# Patient Record
Sex: Female | Born: 1965 | Race: Black or African American | Hispanic: No | Marital: Single | Smoking: Never smoker
Health system: Southern US, Community
[De-identification: ages and names within clinical notes are randomized; demographics above are authoritative.]

## PROBLEM LIST (undated history)

## (undated) DIAGNOSIS — C801 Malignant (primary) neoplasm, unspecified: Secondary | ICD-10-CM

## (undated) DIAGNOSIS — E039 Hypothyroidism, unspecified: Secondary | ICD-10-CM

## (undated) DIAGNOSIS — I639 Cerebral infarction, unspecified: Secondary | ICD-10-CM

## (undated) DIAGNOSIS — R194 Change in bowel habit: Secondary | ICD-10-CM

## (undated) DIAGNOSIS — Z973 Presence of spectacles and contact lenses: Secondary | ICD-10-CM

## (undated) DIAGNOSIS — C8330 Diffuse large B-cell lymphoma, unspecified site: Secondary | ICD-10-CM

## (undated) DIAGNOSIS — C73 Malignant neoplasm of thyroid gland: Secondary | ICD-10-CM

## (undated) DIAGNOSIS — N8110 Cystocele, unspecified: Secondary | ICD-10-CM

## (undated) DIAGNOSIS — M19011 Primary osteoarthritis, right shoulder: Secondary | ICD-10-CM

## (undated) DIAGNOSIS — Z923 Personal history of irradiation: Secondary | ICD-10-CM

## (undated) DIAGNOSIS — D709 Neutropenia, unspecified: Secondary | ICD-10-CM

## (undated) DIAGNOSIS — Z8572 Personal history of non-Hodgkin lymphomas: Secondary | ICD-10-CM

## (undated) DIAGNOSIS — E119 Type 2 diabetes mellitus without complications: Secondary | ICD-10-CM

## (undated) DIAGNOSIS — R5081 Fever presenting with conditions classified elsewhere: Principal | ICD-10-CM

## (undated) DIAGNOSIS — K5900 Constipation, unspecified: Secondary | ICD-10-CM

## (undated) DIAGNOSIS — Z9889 Other specified postprocedural states: Secondary | ICD-10-CM

## (undated) DIAGNOSIS — R112 Nausea with vomiting, unspecified: Secondary | ICD-10-CM

## (undated) HISTORY — PX: OTHER SURGICAL HISTORY: SHX169

## (undated) HISTORY — DX: Neutropenia, unspecified: D70.9

## (undated) HISTORY — PX: REDUCTION MAMMAPLASTY: SUR839

## (undated) HISTORY — PX: COSMETIC SURGERY: SHX468

## (undated) HISTORY — DX: Diffuse large B-cell lymphoma, unspecified site: C83.30

## (undated) HISTORY — DX: Fever presenting with conditions classified elsewhere: R50.81

## (undated) HISTORY — DX: Personal history of irradiation: Z92.3

## (undated) HISTORY — DX: Type 2 diabetes mellitus without complications: E11.9

## (undated) HISTORY — PX: JOINT REPLACEMENT: SHX530

## (undated) HISTORY — PX: BREAST SURGERY: SHX581

## (undated) HISTORY — DX: Change in bowel habit: R19.4

## (undated) HISTORY — PX: COLONOSCOPY: SHX174

## (undated) HISTORY — DX: Personal history of non-Hodgkin lymphomas: Z85.72

---

## 1988-12-14 HISTORY — PX: TUBAL LIGATION: SHX77

## 1998-12-14 HISTORY — PX: ABDOMINAL HYSTERECTOMY: SHX81

## 2000-04-13 LAB — HM PAP SMEAR: HM PAP: NORMAL

## 2005-12-14 DIAGNOSIS — C801 Malignant (primary) neoplasm, unspecified: Secondary | ICD-10-CM

## 2005-12-14 HISTORY — DX: Malignant (primary) neoplasm, unspecified: C80.1

## 2005-12-14 HISTORY — PX: THYROIDECTOMY: SHX17

## 2006-05-21 ENCOUNTER — Encounter: Admission: RE | Admit: 2006-05-21 | Discharge: 2006-05-21 | Payer: Self-pay | Admitting: Obstetrics and Gynecology

## 2006-06-04 ENCOUNTER — Encounter: Admission: RE | Admit: 2006-06-04 | Discharge: 2006-06-04 | Payer: Self-pay | Admitting: Obstetrics and Gynecology

## 2006-07-20 ENCOUNTER — Other Ambulatory Visit: Admission: RE | Admit: 2006-07-20 | Discharge: 2006-07-20 | Payer: Self-pay | Admitting: Interventional Radiology

## 2006-07-20 ENCOUNTER — Encounter (INDEPENDENT_AMBULATORY_CARE_PROVIDER_SITE_OTHER): Payer: Self-pay | Admitting: *Deleted

## 2006-07-20 ENCOUNTER — Encounter: Admission: RE | Admit: 2006-07-20 | Discharge: 2006-07-20 | Payer: Self-pay | Admitting: Endocrinology

## 2006-08-17 ENCOUNTER — Encounter (INDEPENDENT_AMBULATORY_CARE_PROVIDER_SITE_OTHER): Payer: Self-pay | Admitting: Specialist

## 2006-08-17 ENCOUNTER — Ambulatory Visit (HOSPITAL_COMMUNITY): Admission: RE | Admit: 2006-08-17 | Discharge: 2006-08-18 | Payer: Self-pay | Admitting: General Surgery

## 2006-09-06 ENCOUNTER — Encounter: Admission: RE | Admit: 2006-09-06 | Discharge: 2006-09-06 | Payer: Self-pay | Admitting: Endocrinology

## 2006-09-16 ENCOUNTER — Encounter: Admission: RE | Admit: 2006-09-16 | Discharge: 2006-09-16 | Payer: Self-pay | Admitting: Endocrinology

## 2006-09-23 ENCOUNTER — Encounter: Admission: RE | Admit: 2006-09-23 | Discharge: 2006-09-23 | Payer: Self-pay | Admitting: Endocrinology

## 2007-07-01 ENCOUNTER — Encounter: Admission: RE | Admit: 2007-07-01 | Discharge: 2007-07-01 | Payer: Self-pay | Admitting: Obstetrics and Gynecology

## 2007-10-05 ENCOUNTER — Encounter: Admission: RE | Admit: 2007-10-05 | Discharge: 2007-10-05 | Payer: Self-pay | Admitting: Endocrinology

## 2008-01-11 ENCOUNTER — Emergency Department (HOSPITAL_COMMUNITY): Admission: EM | Admit: 2008-01-11 | Discharge: 2008-01-11 | Payer: Self-pay | Admitting: Emergency Medicine

## 2008-08-01 ENCOUNTER — Encounter: Admission: RE | Admit: 2008-08-01 | Discharge: 2008-08-01 | Payer: Self-pay | Admitting: Obstetrics and Gynecology

## 2008-12-25 ENCOUNTER — Encounter: Admission: RE | Admit: 2008-12-25 | Discharge: 2008-12-25 | Payer: Self-pay | Admitting: Otolaryngology

## 2009-01-07 ENCOUNTER — Encounter (INDEPENDENT_AMBULATORY_CARE_PROVIDER_SITE_OTHER): Payer: Self-pay | Admitting: Interventional Radiology

## 2009-01-07 ENCOUNTER — Ambulatory Visit (HOSPITAL_COMMUNITY): Admission: RE | Admit: 2009-01-07 | Discharge: 2009-01-07 | Payer: Self-pay | Admitting: *Deleted

## 2009-01-29 ENCOUNTER — Ambulatory Visit (HOSPITAL_COMMUNITY): Admission: RE | Admit: 2009-01-29 | Discharge: 2009-01-29 | Payer: Self-pay | Admitting: General Surgery

## 2009-02-21 ENCOUNTER — Encounter: Admission: RE | Admit: 2009-02-21 | Discharge: 2009-05-22 | Payer: Self-pay | Admitting: General Surgery

## 2009-04-15 ENCOUNTER — Encounter (HOSPITAL_COMMUNITY): Admission: RE | Admit: 2009-04-15 | Discharge: 2009-07-14 | Payer: Self-pay | Admitting: Endocrinology

## 2009-05-28 ENCOUNTER — Ambulatory Visit (HOSPITAL_COMMUNITY): Admission: RE | Admit: 2009-05-28 | Discharge: 2009-05-29 | Payer: Self-pay | Admitting: General Surgery

## 2009-05-28 HISTORY — PX: LAPAROSCOPIC GASTRIC BANDING WITH HIATAL HERNIA REPAIR: SHX6351

## 2009-06-11 ENCOUNTER — Encounter: Admission: RE | Admit: 2009-06-11 | Discharge: 2009-09-09 | Payer: Self-pay | Admitting: General Surgery

## 2009-08-29 ENCOUNTER — Ambulatory Visit: Payer: Self-pay | Admitting: Genetic Counselor

## 2009-09-09 ENCOUNTER — Encounter: Admission: RE | Admit: 2009-09-09 | Discharge: 2009-09-09 | Payer: Self-pay | Admitting: Obstetrics & Gynecology

## 2010-11-28 ENCOUNTER — Ambulatory Visit: Payer: Self-pay | Admitting: Hematology and Oncology

## 2010-12-10 ENCOUNTER — Encounter
Admission: RE | Admit: 2010-12-10 | Discharge: 2010-12-10 | Payer: Self-pay | Source: Home / Self Care | Attending: Obstetrics & Gynecology | Admitting: Obstetrics & Gynecology

## 2010-12-23 LAB — COMPREHENSIVE METABOLIC PANEL
ALT: <8 U/L (ref 0–35)
AST: 13 U/L (ref 0–37)
Albumin: 4.4 g/dL (ref 3.5–5.2)
Alkaline Phosphatase: 49 U/L (ref 39–117)
BUN: 11 mg/dL (ref 6–23)
CO2: 27 mEq/L (ref 19–32)
Calcium: 9.5 mg/dL (ref 8.4–10.5)
Chloride: 101 mEq/L (ref 96–112)
Creatinine, Ser: 0.86 mg/dL (ref 0.40–1.20)
Glucose, Bld: 96 mg/dL (ref 70–99)
Potassium: 4.2 mEq/L (ref 3.5–5.3)
Sodium: 138 mEq/L (ref 135–145)
Total Bilirubin: 0.6 mg/dL (ref 0.3–1.2)
Total Protein: 7.2 g/dL (ref 6.0–8.3)

## 2010-12-23 LAB — CBC WITH DIFFERENTIAL/PLATELET
BASO%: 0.5 % (ref 0.0–2.0)
Basophils Absolute: 0 10*3/uL (ref 0.0–0.1)
EOS%: 1.7 % (ref 0.0–7.0)
Eosinophils Absolute: 0.1 10*3/uL (ref 0.0–0.5)
HCT: 39.9 % (ref 34.8–46.6)
HGB: 13.3 g/dL (ref 11.6–15.9)
LYMPH%: 59 % — ABNORMAL HIGH (ref 14.0–49.7)
MCH: 28.7 pg (ref 25.1–34.0)
MCHC: 33.3 g/dL (ref 31.5–36.0)
MCV: 86.2 fL (ref 79.5–101.0)
MONO#: 0.2 10*3/uL (ref 0.1–0.9)
MONO%: 6.7 % (ref 0.0–14.0)
NEUT#: 1 10*3/uL — ABNORMAL LOW (ref 1.5–6.5)
NEUT%: 32.1 % — ABNORMAL LOW (ref 38.4–76.8)
Platelets: 327 10*3/uL (ref 145–400)
RBC: 4.63 10*6/uL (ref 3.70–5.45)
RDW: 14.3 % (ref 11.2–14.5)
WBC: 3.2 10*3/uL — ABNORMAL LOW (ref 3.9–10.3)
lymph#: 1.9 10*3/uL (ref 0.9–3.3)

## 2010-12-23 LAB — MORPHOLOGY
PLT EST: ADEQUATE
RBC Comments: NORMAL

## 2010-12-23 LAB — FOLATE: Folate: 9.4 ng/mL

## 2010-12-23 LAB — VITAMIN B12: Vitamin B-12: 468 pg/mL (ref 211–911)

## 2010-12-23 LAB — LACTATE DEHYDROGENASE: LDH: 122 U/L (ref 94–250)

## 2010-12-23 LAB — VITAMIN D 25 HYDROXY (VIT D DEFICIENCY, FRACTURES): Vit D, 25-Hydroxy: 27 ng/mL — ABNORMAL LOW (ref 30–89)

## 2010-12-25 ENCOUNTER — Ambulatory Visit (HOSPITAL_COMMUNITY)
Admission: RE | Admit: 2010-12-25 | Discharge: 2010-12-25 | Payer: Self-pay | Source: Home / Self Care | Attending: Hematology and Oncology | Admitting: Hematology and Oncology

## 2010-12-31 ENCOUNTER — Ambulatory Visit (HOSPITAL_BASED_OUTPATIENT_CLINIC_OR_DEPARTMENT_OTHER): Payer: 59 | Admitting: Hematology and Oncology

## 2011-01-04 ENCOUNTER — Encounter: Payer: Self-pay | Admitting: Endocrinology

## 2011-01-05 LAB — CBC WITH DIFFERENTIAL/PLATELET
EOS%: 2.6 % (ref 0.0–7.0)
HCT: 35 % (ref 34.8–46.6)
HGB: 11.9 g/dL (ref 11.6–15.9)
LYMPH%: 50.2 % — ABNORMAL HIGH (ref 14.0–49.7)
MCH: 29.6 pg (ref 25.1–34.0)
MCV: 86.9 fL (ref 79.5–101.0)
MONO%: 10 % (ref 0.0–14.0)
NEUT%: 36.5 % — ABNORMAL LOW (ref 38.4–76.8)
WBC: 3.5 10*3/uL — ABNORMAL LOW (ref 3.9–10.3)

## 2011-01-08 LAB — CBC WITH DIFFERENTIAL/PLATELET
BASO%: 0.3 % (ref 0.0–2.0)
Basophils Absolute: 0 10*3/uL (ref 0.0–0.1)
HCT: 34.1 % — ABNORMAL LOW (ref 34.8–46.6)
LYMPH%: 49.7 % (ref 14.0–49.7)
MCHC: 33.7 g/dL (ref 31.5–36.0)
MONO%: 9.4 % (ref 0.0–14.0)
WBC: 3.6 10*3/uL — ABNORMAL LOW (ref 3.9–10.3)
nRBC: 0 % (ref 0–0)

## 2011-01-12 LAB — CBC WITH DIFFERENTIAL/PLATELET
BASO%: 0.5 % (ref 0.0–2.0)
EOS%: 1.9 % (ref 0.0–7.0)
HGB: 11.4 g/dL — ABNORMAL LOW (ref 11.6–15.9)
MCV: 86.9 fL (ref 79.5–101.0)
MONO#: 0.3 10*3/uL (ref 0.1–0.9)
RBC: 3.88 10*6/uL (ref 3.70–5.45)
RDW: 15 % — ABNORMAL HIGH (ref 11.2–14.5)
lymph#: 1.7 10*3/uL (ref 0.9–3.3)

## 2011-01-15 ENCOUNTER — Ambulatory Visit: Payer: 59 | Admitting: Hematology and Oncology

## 2011-01-15 ENCOUNTER — Encounter (HOSPITAL_BASED_OUTPATIENT_CLINIC_OR_DEPARTMENT_OTHER): Payer: 59 | Admitting: Hematology and Oncology

## 2011-01-15 DIAGNOSIS — D709 Neutropenia, unspecified: Secondary | ICD-10-CM

## 2011-01-15 LAB — CBC WITH DIFFERENTIAL/PLATELET
BASO%: 0.3 % (ref 0.0–2.0)
EOS%: 2.6 % (ref 0.0–7.0)
HGB: 11.9 g/dL (ref 11.6–15.9)
LYMPH%: 45.7 % (ref 14.0–49.7)
MONO#: 0.3 10*3/uL (ref 0.1–0.9)
MONO%: 8.6 % (ref 0.0–14.0)
NEUT#: 1.5 10*3/uL (ref 1.5–6.5)
Platelets: 231 10*3/uL (ref 145–400)
RBC: 4.1 10*6/uL (ref 3.70–5.45)

## 2011-01-19 ENCOUNTER — Encounter: Payer: 59 | Admitting: Hematology and Oncology

## 2011-01-19 ENCOUNTER — Other Ambulatory Visit: Payer: Self-pay | Admitting: Hematology and Oncology

## 2011-01-19 DIAGNOSIS — D709 Neutropenia, unspecified: Secondary | ICD-10-CM

## 2011-01-19 LAB — CBC WITH DIFFERENTIAL/PLATELET
Basophils Absolute: 0.1 10*3/uL (ref 0.0–0.1)
Eosinophils Absolute: 0.1 10*3/uL (ref 0.0–0.5)
MCH: 29.1 pg (ref 25.1–34.0)
MCV: 87.4 fL (ref 79.5–101.0)
MONO%: 3.3 % (ref 0.0–14.0)
NEUT#: 1.8 10*3/uL (ref 1.5–6.5)
NEUT%: 47.2 % (ref 38.4–76.8)
Platelets: 212 10*3/uL (ref 145–400)
WBC: 3.7 10*3/uL — ABNORMAL LOW (ref 3.9–10.3)

## 2011-01-22 ENCOUNTER — Other Ambulatory Visit: Payer: Self-pay | Admitting: Hematology and Oncology

## 2011-01-22 ENCOUNTER — Encounter (HOSPITAL_BASED_OUTPATIENT_CLINIC_OR_DEPARTMENT_OTHER): Payer: 59 | Admitting: Hematology and Oncology

## 2011-01-22 DIAGNOSIS — D709 Neutropenia, unspecified: Secondary | ICD-10-CM

## 2011-01-22 LAB — CBC WITH DIFFERENTIAL/PLATELET
BASO%: 0.5 % (ref 0.0–2.0)
Basophils Absolute: 0 10*3/uL (ref 0.0–0.1)
HCT: 36.6 % (ref 34.8–46.6)
HGB: 12.2 g/dL (ref 11.6–15.9)
MCHC: 33.4 g/dL (ref 31.5–36.0)
MONO#: 0.3 10*3/uL (ref 0.1–0.9)
NEUT#: 1.5 10*3/uL (ref 1.5–6.5)
NEUT%: 45.6 % (ref 38.4–76.8)
WBC: 3.4 10*3/uL — ABNORMAL LOW (ref 3.9–10.3)
lymph#: 1.4 10*3/uL (ref 0.9–3.3)

## 2011-01-26 ENCOUNTER — Other Ambulatory Visit: Payer: Self-pay | Admitting: Hematology and Oncology

## 2011-01-26 ENCOUNTER — Encounter (HOSPITAL_BASED_OUTPATIENT_CLINIC_OR_DEPARTMENT_OTHER): Payer: 59 | Admitting: Hematology and Oncology

## 2011-01-26 DIAGNOSIS — D709 Neutropenia, unspecified: Secondary | ICD-10-CM

## 2011-01-26 LAB — CBC WITH DIFFERENTIAL/PLATELET
BASO%: 0.7 % (ref 0.0–2.0)
EOS%: 2.8 % (ref 0.0–7.0)
Eosinophils Absolute: 0.1 10*3/uL (ref 0.0–0.5)
LYMPH%: 35.4 % (ref 14.0–49.7)
MCH: 29.1 pg (ref 25.1–34.0)
MCHC: 33.1 g/dL (ref 31.5–36.0)
MCV: 87.9 fL (ref 79.5–101.0)
MONO%: 7.7 % (ref 0.0–14.0)
NEUT#: 2.5 10*3/uL (ref 1.5–6.5)
Platelets: 289 10*3/uL (ref 145–400)
RBC: 4.06 10*6/uL (ref 3.70–5.45)
RDW: 15.2 % — ABNORMAL HIGH (ref 11.2–14.5)

## 2011-01-29 ENCOUNTER — Encounter (HOSPITAL_BASED_OUTPATIENT_CLINIC_OR_DEPARTMENT_OTHER): Payer: 59 | Admitting: Hematology and Oncology

## 2011-01-29 ENCOUNTER — Other Ambulatory Visit: Payer: Self-pay | Admitting: Physician Assistant

## 2011-01-29 DIAGNOSIS — D709 Neutropenia, unspecified: Secondary | ICD-10-CM

## 2011-01-29 LAB — CBC WITH DIFFERENTIAL/PLATELET
BASO%: 0.5 % (ref 0.0–2.0)
Eosinophils Absolute: 0.1 10*3/uL (ref 0.0–0.5)
LYMPH%: 47.4 % (ref 14.0–49.7)
MCHC: 33.7 g/dL (ref 31.5–36.0)
MONO#: 0.3 10*3/uL (ref 0.1–0.9)
NEUT#: 1.7 10*3/uL (ref 1.5–6.5)
RBC: 4.29 10*6/uL (ref 3.70–5.45)
RDW: 14.1 % (ref 11.2–14.5)
WBC: 4 10*3/uL (ref 3.9–10.3)

## 2011-02-02 ENCOUNTER — Encounter (HOSPITAL_BASED_OUTPATIENT_CLINIC_OR_DEPARTMENT_OTHER): Payer: 59 | Admitting: Hematology and Oncology

## 2011-02-02 ENCOUNTER — Other Ambulatory Visit: Payer: Self-pay | Admitting: Hematology and Oncology

## 2011-02-02 DIAGNOSIS — D709 Neutropenia, unspecified: Secondary | ICD-10-CM

## 2011-02-02 LAB — CBC WITH DIFFERENTIAL/PLATELET
Basophils Absolute: 0 10*3/uL (ref 0.0–0.1)
EOS%: 2.5 % (ref 0.0–7.0)
HGB: 12.8 g/dL (ref 11.6–15.9)
LYMPH%: 54.7 % — ABNORMAL HIGH (ref 14.0–49.7)
MCH: 28.6 pg (ref 25.1–34.0)
MCV: 86.8 fL (ref 79.5–101.0)
MONO%: 6.5 % (ref 0.0–14.0)
Platelets: 303 10*3/uL (ref 145–400)
RDW: 14.2 % (ref 11.2–14.5)

## 2011-03-23 LAB — CBC
HCT: 38.4 % (ref 36.0–46.0)
Hemoglobin: 13 g/dL (ref 12.0–15.0)
MCHC: 34 g/dL (ref 30.0–36.0)
MCHC: 33.8 g/dL (ref 30.0–36.0)
MCV: 87 fL (ref 78.0–100.0)
MCV: 87.1 fL (ref 78.0–100.0)
Platelets: 295 10*3/uL (ref 150–400)
RBC: 4.57 MIL/uL (ref 3.87–5.11)
RDW: 13.6 % (ref 11.5–15.5)

## 2011-03-23 LAB — GLUCOSE, CAPILLARY
Glucose-Capillary: 160 mg/dL — ABNORMAL HIGH (ref 70–99)
Glucose-Capillary: 187 mg/dL — ABNORMAL HIGH (ref 70–99)
Glucose-Capillary: 202 mg/dL — ABNORMAL HIGH (ref 70–99)

## 2011-03-23 LAB — COMPREHENSIVE METABOLIC PANEL
AST: 28 U/L (ref 0–37)
CO2: 29 mEq/L (ref 19–32)
Calcium: 9.3 mg/dL (ref 8.4–10.5)
Creatinine, Ser: 1.01 mg/dL (ref 0.4–1.2)
GFR calc Af Amer: >60 mL/min (ref >60)
GFR calc non Af Amer: 59 mL/min — ABNORMAL LOW (ref >60)

## 2011-03-23 LAB — DIFFERENTIAL
Basophils Absolute: 0 10*3/uL (ref 0.0–0.1)
Basophils Relative: 0 % (ref 0–1)
Eosinophils Absolute: 0 10*3/uL (ref 0.0–0.7)
Eosinophils Relative: 0 % (ref 0–5)
Eosinophils Relative: 3 % (ref 0–5)
Lymphocytes Relative: 37 % (ref 12–46)
Lymphs Abs: 2.1 10*3/uL (ref 0.7–4.0)
Monocytes Absolute: 1 10*3/uL (ref 0.1–1.0)

## 2011-03-30 LAB — CBC
HCT: 39.2 % (ref 36.0–46.0)
Hemoglobin: 13 g/dL (ref 12.0–15.0)
MCHC: 33 g/dL (ref 30.0–36.0)
MCV: 85.3 fL (ref 78.0–100.0)
RBC: 4.6 MIL/uL (ref 3.87–5.11)

## 2011-03-30 LAB — GLUCOSE, CAPILLARY

## 2011-04-28 NOTE — Op Note (Signed)
NAMEMARONDA, [name]             ACCOUNT NO.:  [id number]   MEDICAL RECORD NO.:  [id number]          PATIENT TYPE:  AMB   LOCATION:  DAY                          FACILITY:  [facility]   PHYSICIAN:  Sharlet Salina T. Hoxworth, M.D.DATE OF BIRTH:  [birth year]   DATE OF PROCEDURE:  05/28/2009  DATE OF DISCHARGE:                               OPERATIVE REPORT   PREOPERATIVE DIAGNOSES:  1. Morbid obesity.  2. Hiatal hernia.   POSTOPERATIVE DIAGNOSES:  1. Morbid obesity.  2. Hiatal hernia.   SURGICAL PROCEDURES:  1. Laparoscopic adjustable gastric band.  2. Hiatal hernia repair.   SURGEON:  Lorne Skeens. Hoxworth, M.D.   ASSISTANT:  Thornton Park. Daphine Deutscher, MD.   ANESTHESIA:  General.   BRIEF HISTORY:  [name] is a 45 year old female with progressive  morbid obesity unresponsive to multiple attempts at medical management.  She presents with a BMI of 42.3 and comorbidities of adult-onset  diabetes mellitus, elevated cholesterol, chronic joint pain.  After  extensive preoperative discussion of workup detailed elsewhere, we have  elected to proceed with placement of laparoscopic adjustable gastric  band for treatment of her morbid obesity.  On preoperative workup she  was noted to have a moderate-sized hiatal hernia and this will be  repaired as well.   DESCRIPTION OF OPERATION:  The patient was brought to operating room,  placed in supine position on the table and general endotracheal  anesthesia was induced.  She had received preoperative IV antibiotics  and subcutaneous heparin.  PAS were in place.  The abdomen was widely  sterilely prepped and draped.  The correct patient and procedure  verified.  Local anesthesia was used to infiltrate the trocar sites.  Access was obtained with 11 mm OptiView trocar in the left upper  quadrant midclavicular line without difficulty and pneumoperitoneum  established.  There was no evidence of trocar injury.  Under direct  vision a 15 mm trocar was placed  well laterally in the right upper  quadrant.  A 12 mm trocar in the right upper quadrant midclavicular  line, an 11 mm trocar just above at the left umbilicus for the camera  port and a 5 mm trocar in the left flank.  Under direct vision through a  5 mm site the [facility] retractor was placed and left lobe of liver  elevated with excellent exposure of the upper stomach and hiatus.  The  gastrohepatic ligament was opened along an avascular area and the right  crus exposed.  The peritoneum up over the esophagus was incised.  The  peritoneum anterior to the right crus was incised and careful blunt  dissection was used to dissect the retroesophageal space and completely  dissect out the posterior crura and the distal esophagus and up into the  mediastinum.  There was a moderate-sized hiatal hernia as noted on upper  GI.  After nice definition of the crura a posterior crural repair was  done with two interrupted zero Ethibond sutures, closing the hiatus down  to a normal size.  Following this, the peritoneum over the left crus was  incised.  Careful blunt dissection carried  back down toward the  retrogastric space.  A little lower on the right crus then the hernia  repair of the crossing fat.  The peritoneum was incised and the finger  dissector passed into the retrogastric space and passed easily up and  deployed through the previously dissected area of the angle of His.  A  flushed AP standard band system was introduced and passed to the finger  dissector which was then brought through the retrogastric tunnel and the  band brought through the retrogastric tunnel without difficulty.  With a  Ewald sizing tube in place the band was snapped without any tension and  the Ewald tube removed, holding the tubing toward the patient's feet  exposing the small gastric pouch.  The fundus was imbricated up over the  band to the pouch with three interrupted 2-0 Ethibond sutures.  The band  appeared to be  in excellent position.  There was no evidence of bleeding  or trocar injury.  The Nathanson retractor was removed and then all CO2  evacuated.  The trocars were removed after bringing the tubing through  the right mid trocar site.  The tubing was cut and attached to the port.  A piece of Prolene mesh was sutured to the back of the port.  A  subcutaneous pocket was created and the port placed subcutaneously and  the tubing introduced smoothly into the abdominal cavity.  The skin  incisions were closed with subcuticular Monocryl and Dermabond.  Sponge  and needle counts were correct.  The patient was taken to recovery in  good condition.      Lorne Skeens. Hoxworth, M.D.  Electronically Signed     BTH/MEDQ  D:  05/28/2009  T:  05/28/2009  Job:  [id number]

## 2011-08-25 ENCOUNTER — Other Ambulatory Visit: Payer: Self-pay | Admitting: Hematology and Oncology

## 2011-08-25 ENCOUNTER — Encounter (HOSPITAL_BASED_OUTPATIENT_CLINIC_OR_DEPARTMENT_OTHER): Payer: 59 | Admitting: Hematology and Oncology

## 2011-08-25 DIAGNOSIS — D709 Neutropenia, unspecified: Secondary | ICD-10-CM

## 2011-08-25 LAB — BASIC METABOLIC PANEL
BUN: 12 mg/dL (ref 6–23)
CO2: 28 mEq/L (ref 19–32)
Chloride: 103 mEq/L (ref 96–112)
Creatinine, Ser: 0.85 mg/dL (ref 0.50–1.10)
Potassium: 4.2 mEq/L (ref 3.5–5.3)

## 2011-08-25 LAB — CBC WITH DIFFERENTIAL/PLATELET
BASO%: 0.6 % (ref 0.0–2.0)
Basophils Absolute: 0 10*3/uL (ref 0.0–0.1)
HCT: 37.7 % (ref 34.8–46.6)
HGB: 12.8 g/dL (ref 11.6–15.9)
MONO#: 0.3 10*3/uL (ref 0.1–0.9)
NEUT%: 47 % (ref 38.4–76.8)
RDW: 12.7 % (ref 11.2–14.5)
WBC: 3.8 10*3/uL — ABNORMAL LOW (ref 3.9–10.3)
lymph#: 1.6 10*3/uL (ref 0.9–3.3)

## 2011-09-03 LAB — COMPREHENSIVE METABOLIC PANEL
ALT: 22
AST: 18
Alkaline Phosphatase: 76
CO2: 30
Chloride: 100
Creatinine, Ser: 0.82
GFR calc Af Amer: >60
GFR calc non Af Amer: >60
Total Bilirubin: 0.4

## 2011-09-03 LAB — POCT CARDIAC MARKERS
CKMB, poc: <1 — ABNORMAL LOW
Operator id: 234501
Operator id: 234501
Troponin i, poc: <0.05
Troponin i, poc: <0.05

## 2011-09-03 LAB — DIFFERENTIAL
Basophils Absolute: 0
Eosinophils Absolute: 0.2
Eosinophils Relative: 3
Lymphocytes Relative: 33
Monocytes Absolute: 0.5

## 2011-09-03 LAB — CBC
MCV: 85
RBC: 4.45
WBC: 7.1

## 2012-03-15 ENCOUNTER — Other Ambulatory Visit: Payer: Self-pay

## 2012-03-15 ENCOUNTER — Emergency Department (HOSPITAL_COMMUNITY)
Admission: EM | Admit: 2012-03-15 | Discharge: 2012-03-15 | Disposition: A | Discharge status: Home / Self Care | Payer: No Typology Code available for payment source | Attending: Emergency Medicine | Admitting: Emergency Medicine

## 2012-03-15 ENCOUNTER — Encounter (HOSPITAL_COMMUNITY): Payer: Self-pay | Admitting: *Deleted

## 2012-03-15 DIAGNOSIS — Z79899 Other long term (current) drug therapy: Secondary | ICD-10-CM | POA: Insufficient documentation

## 2012-03-15 DIAGNOSIS — E079 Disorder of thyroid, unspecified: Secondary | ICD-10-CM | POA: Insufficient documentation

## 2012-03-15 DIAGNOSIS — R079 Chest pain, unspecified: Secondary | ICD-10-CM | POA: Insufficient documentation

## 2012-03-15 HISTORY — DX: Malignant (primary) neoplasm, unspecified: C80.1

## 2012-03-15 LAB — COMPREHENSIVE METABOLIC PANEL
ALT: 12 U/L (ref 0–35)
AST: 17 U/L (ref 0–37)
Albumin: 3.7 g/dL (ref 3.5–5.2)
Alkaline Phosphatase: 38 U/L — ABNORMAL LOW (ref 39–117)
CO2: 30 mEq/L (ref 19–32)
Chloride: 104 mEq/L (ref 96–112)
GFR calc non Af Amer: 81 mL/min — ABNORMAL LOW (ref >90)
Potassium: 3.7 mEq/L (ref 3.5–5.1)
Total Bilirubin: 0.2 mg/dL — ABNORMAL LOW (ref 0.3–1.2)

## 2012-03-15 LAB — DIFFERENTIAL
Basophils Absolute: 0 10*3/uL (ref 0.0–0.1)
Basophils Relative: 0 % (ref 0–1)
Lymphocytes Relative: 58 % — ABNORMAL HIGH (ref 12–46)
Neutro Abs: 1.3 10*3/uL — ABNORMAL LOW (ref 1.7–7.7)
Neutrophils Relative %: 33 % — ABNORMAL LOW (ref 43–77)

## 2012-03-15 LAB — CBC
HCT: 33.7 % — ABNORMAL LOW (ref 36.0–46.0)
Hemoglobin: 11.4 g/dL — ABNORMAL LOW (ref 12.0–15.0)
MCHC: 33.8 g/dL (ref 30.0–36.0)
RDW: 13.3 % (ref 11.5–15.5)
WBC: 3.9 10*3/uL — ABNORMAL LOW (ref 4.0–10.5)

## 2012-03-15 LAB — CK TOTAL AND CKMB (NOT AT ARMC): CK, MB: 2.9 ng/mL (ref 0.3–4.0)

## 2012-03-15 LAB — POCT I-STAT TROPONIN I

## 2012-03-15 MED ORDER — ASPIRIN 325 MG PO TABS
325.0000 mg | ORAL_TABLET | Freq: Once | ORAL | Status: AC
  Administered 2012-03-15: 325 mg via ORAL
  Filled 2012-03-15: qty 1

## 2012-03-15 NOTE — ED Notes (Signed)
Mid chest pain since yesterday with feeling tired.  She was seen at a doctors office today and sent here for further treatment

## 2012-03-15 NOTE — Discharge Instructions (Signed)
Chest Pain (Nonspecific) It is often hard to give a specific diagnosis for the cause of chest pain. There is always a chance that your pain could be related to something serious, such as a heart attack or a blood clot in the lungs. You need to follow up with your caregiver for further evaluation. CAUSES   Heartburn.   Pneumonia or bronchitis.   Anxiety or stress.   Inflammation around your heart (pericarditis) or lung (pleuritis or pleurisy).   A blood clot in the lung.   A collapsed lung (pneumothorax). It can develop suddenly on its own (spontaneous pneumothorax) or from injury (trauma) to the chest.   Shingles infection (herpes zoster virus).  The chest wall is composed of bones, muscles, and cartilage. Any of these can be the source of the pain.  The bones can be bruised by injury.   The muscles or cartilage can be strained by coughing or overwork.   The cartilage can be affected by inflammation and become sore (costochondritis).  DIAGNOSIS  Lab tests or other studies, such as X-rays, electrocardiography, stress testing, or cardiac imaging, may be needed to find the cause of your pain.  TREATMENT   Treatment depends on what may be causing your chest pain. Treatment may include:   Acid blockers for heartburn.   Anti-inflammatory medicine.   Pain medicine for inflammatory conditions.   Antibiotics if an infection is present.   You may be advised to change lifestyle habits. This includes stopping smoking and avoiding alcohol, caffeine, and chocolate.   You may be advised to keep your head raised (elevated) when sleeping. This reduces the chance of acid going backward from your stomach into your esophagus.   Most of the time, nonspecific chest pain will improve within 2 to 3 days with rest and mild pain medicine.  HOME CARE INSTRUCTIONS   If antibiotics were prescribed, take your antibiotics as directed. Finish them even if you start to feel better.   For the next few  days, avoid physical activities that bring on chest pain. Continue physical activities as directed.   Do not smoke.   Avoid drinking alcohol.   Only take over-the-counter or prescription medicine for pain, discomfort, or fever as directed by your caregiver.   Follow your caregiver's suggestions for further testing if your chest pain does not go away.   Keep any follow-up appointments you made. If you do not go to an appointment, you could develop lasting (chronic) problems with pain. If there is any problem keeping an appointment, you must call to reschedule.  SEEK MEDICAL CARE IF:   You think you are having problems from the medicine you are taking. Read your medicine instructions carefully.   Your chest pain does not go away, even after treatment.   You develop a rash with blisters on your chest.  SEEK IMMEDIATE MEDICAL CARE IF:   You have increased chest pain or pain that spreads to your arm, neck, jaw, back, or abdomen.   You develop shortness of breath, an increasing cough, or you are coughing up blood.   You have severe back or abdominal pain, feel nauseous, or vomit.   You develop severe weakness, fainting, or chills.   You have a fever.  THIS IS AN EMERGENCY. Do not wait to see if the pain will go away. Get medical help at once. Call your local emergency services (911 in [place] Do not drive yourself to the hospital. MAKE SURE YOU:   Understand these instructions.     Will watch your condition.   Will get help right away if you are not doing well or get worse.  Document Released: 09/09/2005 Document Revised: 11/19/2011 Document Reviewed: 07/05/2008 Exit[employer] Patient Information 2012 [employer].  Return for any new or worsening symptoms or any other concerns.  

## 2012-03-15 NOTE — ED Provider Notes (Signed)
History     CSN: [id number]  Arrival date & time 03/15/12  1931   None     Chief Complaint  Patient presents with  . Chest Pain    (Consider location/radiation/quality/duration/timing/severity/associated sxs/prior treatment) HPI CC CP onset yesterday am, dull pressure, substernal, constant since onset with fluctuations in severity, mild to moderate, no aggravating or alleviating factors, no associated sx's including no sob, n/v, diaphoresis.  Not worse with exertion and no sob with exertion.  Past Medical History  Diagnosis Date  . Thyroid disease   . Cancer     History reviewed. No pertinent past surgical history.  No family history on file.  History  Substance Use Topics  . Smoking status: Never Smoker   . Smokeless tobacco: Not on file  . Alcohol Use: No    OB History    Grav Para Term Preterm Abortions TAB SAB Ect Mult Living                  Review of Systems  Constitutional: Negative for fever.  Respiratory: Negative for shortness of breath.   Cardiovascular: Positive for chest pain. Negative for palpitations and leg swelling.  Gastrointestinal: Negative for nausea and vomiting.  Neurological: Negative for light-headedness.  All other systems reviewed and are negative.    Allergies  Penicillins  Home Medications   Current Outpatient Rx  Name Route Sig Dispense Refill  . ASPIRIN EC 81 MG PO TBEC Oral Take 81 mg by mouth daily.    [name] BIOTIN 10 MG PO TABS Oral Take 1 tablet by mouth daily.    [name] CALCIUM CARBONATE-VITAMIN D 500-200 MG-UNIT PO TABS Oral Take 1 tablet by mouth daily.    [name] LEVOTHYROXINE SODIUM 125 MCG PO TABS Oral Take 125 mcg by mouth daily.    . ADULT MULTIVITAMIN W/MINERALS CH Oral Take 1 tablet by mouth daily.      BP 145/89  Pulse 79  Temp(Src) 98.1 F (36.7 C) (Oral)  Resp 19  SpO2 100%ra wnl  Physical Exam  Nursing note and vitals reviewed. Constitutional: She appears well-developed and well-nourished.  HENT:  Head:  Normocephalic and atraumatic.  Eyes: Right eye exhibits no discharge. Left eye exhibits no discharge.  Neck: Normal range of motion. Neck supple.  Cardiovascular: Normal rate, regular rhythm and normal heart sounds.   Pulmonary/Chest: Effort normal and breath sounds normal.  Abdominal: Soft. There is no tenderness.  Musculoskeletal: She exhibits no edema and no tenderness.  Neurological: She is alert. GCS eye subscore is 4. GCS verbal subscore is 5. GCS motor subscore is 6.  Skin: Skin is warm and dry.  Psychiatric: She has a normal mood and affect. Her behavior is normal.    ED Course  Procedures (including critical care time)  Labs Reviewed  CBC - Abnormal; Notable for the following:    WBC 3.9 (*)    RBC 3.86 (*)    Hemoglobin 11.4 (*)    HCT 33.7 (*)    All other components within normal limits  DIFFERENTIAL - Abnormal; Notable for the following:    Neutrophils Relative 33 (*)    Neutro Abs 1.3 (*)    Lymphocytes Relative 58 (*)    All other components within normal limits  COMPREHENSIVE METABOLIC PANEL - Abnormal; Notable for the following:    Glucose, Bld 106 (*)    Alkaline Phosphatase 38 (*)    Total Bilirubin 0.2 (*)    GFR calc non Af Amer 81 (*)  All other components within normal limits  CK TOTAL AND CKMB - Abnormal; Notable for the following:    Total CK 211 (*)    All other components within normal limits  POCT I-STAT TROPONIN I   No results found.   1. Chest pain       Date: 03/15/2012  Rate: nl  Rhythm: normal sinus rhythm  QRS Axis: normal  Intervals: normal  ST/T Wave abnormalities: nonspecific T wave changes  Conduction Disutrbances:none  Narrative Interpretation:   Old EKG Reviewed: unchanged   MDM  Pt is in nad, afvss, nontoxic appearing, exam and hx as above. Here with atypical cp, no cad risk factors other than grandmother with h/o unknown heart dz.  Pain constant for more than 24 hours.  Trop wnl, ekg shows nonspecific abnl but  unchanged from previous in 2010.  Doubt acs.   Pt is PERC negative, doubt pe.  Doubt dissection or esophageal injury.  Likely esophageal in origin, spasm or reflux.  Will d/c, return warnings given.        Elijio Miles, MD 03/15/12 [phone]

## 2012-03-17 NOTE — ED Provider Notes (Signed)
I saw and evaluated the patient, reviewed the resident's note and I agree with the findings and plan.   .Face to face Exam:  General:  Awake HEENT:  Atraumatic Resp:  Normal effort Abd:  Nondistended Neuro:No focal weakness Lymph: No adenopathy   Zayed Griffie L Gizzelle Lacomb, MD 03/17/12 1116 

## 2012-08-11 ENCOUNTER — Telehealth: Payer: Self-pay | Admitting: Hematology and Oncology

## 2012-08-11 NOTE — Telephone Encounter (Signed)
lmonvm (cell) re appts for 9/18 and 9/20. Home phone not working. Schedule mailed.

## 2012-08-31 ENCOUNTER — Other Ambulatory Visit (HOSPITAL_BASED_OUTPATIENT_CLINIC_OR_DEPARTMENT_OTHER): Payer: Commercial Managed Care - PPO | Admitting: Lab

## 2012-08-31 DIAGNOSIS — D709 Neutropenia, unspecified: Secondary | ICD-10-CM

## 2012-08-31 LAB — CBC WITH DIFFERENTIAL/PLATELET
BASO%: 1 % (ref 0.0–2.0)
Eosinophils Absolute: 0.1 10*3/uL (ref 0.0–0.5)
HCT: 37.6 % (ref 34.8–46.6)
HGB: 12.6 g/dL (ref 11.6–15.9)
MCHC: 33.6 g/dL (ref 31.5–36.0)
MONO#: 0.3 10*3/uL (ref 0.1–0.9)
NEUT#: 0.9 10*3/uL — ABNORMAL LOW (ref 1.5–6.5)
NEUT%: 29 % — ABNORMAL LOW (ref 38.4–76.8)
WBC: 3 10*3/uL — ABNORMAL LOW (ref 3.9–10.3)
lymph#: 1.8 10*3/uL (ref 0.9–3.3)

## 2012-08-31 LAB — BASIC METABOLIC PANEL (CC13)
CO2: 30 mEq/L — ABNORMAL HIGH (ref 22–29)
Chloride: 102 mEq/L (ref 98–107)
Creatinine: 0.9 mg/dL (ref 0.6–1.1)
Glucose: 95 mg/dl (ref 70–99)

## 2012-09-01 ENCOUNTER — Encounter: Payer: Self-pay | Admitting: *Deleted

## 2012-09-02 ENCOUNTER — Encounter: Payer: Self-pay | Admitting: Hematology and Oncology

## 2012-09-02 ENCOUNTER — Ambulatory Visit (HOSPITAL_BASED_OUTPATIENT_CLINIC_OR_DEPARTMENT_OTHER): Payer: Commercial Managed Care - PPO | Admitting: Hematology and Oncology

## 2012-09-02 ENCOUNTER — Telehealth: Payer: Self-pay | Admitting: Hematology and Oncology

## 2012-09-02 VITALS — BP 150/89 | HR 69 | Temp 99.0°F | Resp 20 | Ht 63.0 in | Wt 159.9 lb

## 2012-09-02 DIAGNOSIS — D708 Other neutropenia: Secondary | ICD-10-CM

## 2012-09-02 DIAGNOSIS — E039 Hypothyroidism, unspecified: Secondary | ICD-10-CM

## 2012-09-02 DIAGNOSIS — D72819 Decreased white blood cell count, unspecified: Secondary | ICD-10-CM

## 2012-09-02 NOTE — Telephone Encounter (Signed)
Gave pt appt for 2014 September lab and MD

## 2012-09-02 NOTE — Progress Notes (Signed)
This office note has been dictated.

## 2012-09-02 NOTE — Patient Instructions (Addendum)
Maria Simmons  [id number]   [facility] - AFTER VISIT SUMMARY   **RECOMMENDATIONS MADE BY THE CONSULTANT AND ANY TEST    RESULTS WILL BE SENT TO YOUR REFERRING DOCTORS.   YOUR EXAM FINDINGS, LABS AND RESULTS WERE DISCUSSED BY YOUR MD TODAY.  YOU CAN GO TO THE [place] WEB SITE FOR INSTRUCTIONS ON HOW TO ASSESS MY CHART FOR ADDITIONAL INFORMATION AS NEEDED.  Your Updated drug allergies are: Allergies as of 09/02/2012 - Review Complete 09/01/2012  Allergen Reaction Noted  . Metformin and related Rash 09/02/2012  . Penicillins Rash 03/15/2012    Your current list of medications are: Current Outpatient Prescriptions  Medication Sig Dispense Refill  . aspirin EC 81 MG tablet Take 81 mg by mouth daily.      . Biotin 10 MG TABS Take 1 tablet by mouth daily.      . calcium-vitamin D (OSCAL WITH D) 500-200 MG-UNIT per tablet Take 1 tablet by mouth daily.      [name] levothyroxine (SYNTHROID, LEVOTHROID) 125 MCG tablet Take 125 mcg by mouth daily.      . Multiple Vitamin (MULITIVITAMIN WITH MINERALS) TABS Take 1 tablet by mouth daily.         INSTRUCTIONS GIVEN AND DISCUSSED:  See attached schedule   SPECIAL INSTRUCTIONS/FOLLOW-UP:  See above.  I acknowledge that I have been informed and understand all the instructions given to me and received a copy.I know to contact the clinic, my physician, or go to the emergency Department if any problems should occur.   I do not have any more questions at this time, but understand that I may call the [facility] at [phone] during business hours should I have any further questions or need assistance in obtaining follow-up care.

## 2012-09-06 NOTE — Progress Notes (Signed)
CC:   Maria Simmons, M.D. [facility] [name], [place] [phone]  IDENTIFYING STATEMENT:  The patient is a 46 year old woman with neutropenia who presents for followup.  INTERVAL HISTORY:  Maria Simmons was seen a year ago.  She is status post total thyroidectomy for follicular papillary carcinoma in 2007.  Status post radioiodine therapy.  Has proneness to hypothyroidism and is on Synthroid.  This is closely followed by Dr. Talmage Nap.  The patient reports that her medications required readjustment in the last couple of weeks. Otherwise feels well.  No history of fever, chills or night sweats. Denies gum infection or skin boils.  Is not prone to upper respiratory tract infections.  Her weight is stable.  MEDICATIONS: 1. Synthroid 175 mcg daily. 2. Denies over-the-counter herbal remedies.  PHYSICAL EXAM:  General:  The patient is alert and oriented times 3. Vitals:  Pulse 69, blood pressure 150/89, temperature 99, respirations 20, weight 159 pounds.  HEENT:  Head is atraumatic, normocephalic. Sclerae anicteric.  Mouth moist.  Neck:  Supple.  Chest:  Clear. Abdomen:  Soft, nontender.  Bowel sounds present.  Extremities:  No edema.  LAB DATA:  On 08/31/2012 white cell count 3, hemoglobin 12.6, hematocrit 37.6, platelets 274.  Sodium 139, potassium 4.3, chloride 102, CO2 30, BUN 11, creatinine 0.9, glucose 95.  IMPRESSION AND PLAN:  Maria Simmons is a 46 year old woman who we have been following with chronic neutropenia.  She remains asymptomatic albeit is undergoing adjustment of her thyroid medications for hypothyroidism.  Status post thyroidectomy for papillary follicular carcinoma and status post radioiodine.  Will continue to monitor.  She follows up in a year's time or sooner if needed.    ______________________________ Laurice Record, M.D. LIO/MEDQ  D:  09/02/2012  T:  09/02/2012  Job:  [id number]

## 2012-12-16 ENCOUNTER — Telehealth (INDEPENDENT_AMBULATORY_CARE_PROVIDER_SITE_OTHER): Payer: Self-pay | Admitting: General Surgery

## 2012-12-16 NOTE — Telephone Encounter (Signed)
12/16/12 mailed bariatric surgery recall letter for follow-up.  Advised patient to call [phone] to schedule an appointment. (lss) ° ° °

## 2013-01-03 ENCOUNTER — Other Ambulatory Visit: Payer: Self-pay | Admitting: Nurse Practitioner

## 2013-01-03 DIAGNOSIS — Z1231 Encounter for screening mammogram for malignant neoplasm of breast: Secondary | ICD-10-CM

## 2013-01-03 DIAGNOSIS — Z9889 Other specified postprocedural states: Secondary | ICD-10-CM

## 2013-01-16 ENCOUNTER — Ambulatory Visit
Admission: RE | Admit: 2013-01-16 | Discharge: 2013-01-16 | Disposition: A | Discharge status: Home / Self Care | Payer: Commercial Managed Care - PPO | Source: Ambulatory Visit | Attending: Nurse Practitioner | Admitting: Nurse Practitioner

## 2013-01-16 DIAGNOSIS — Z9889 Other specified postprocedural states: Secondary | ICD-10-CM

## 2013-01-16 DIAGNOSIS — Z1231 Encounter for screening mammogram for malignant neoplasm of breast: Secondary | ICD-10-CM

## 2013-02-04 ENCOUNTER — Encounter: Payer: Self-pay | Admitting: *Deleted

## 2013-02-04 ENCOUNTER — Telehealth: Payer: Self-pay | Admitting: *Deleted

## 2013-02-04 NOTE — Telephone Encounter (Signed)
Left message stating appointment changes. 09/01/2013 Lab and provider appts changed to 09/04/2013 . Left [phone] as call back to confirm appointments and  if there were any questions and concerns. A letter and appointment calendar will be mailed.

## 2013-09-01 ENCOUNTER — Other Ambulatory Visit: Payer: Commercial Managed Care - PPO | Admitting: Lab

## 2013-09-01 ENCOUNTER — Ambulatory Visit: Payer: Commercial Managed Care - PPO | Admitting: Hematology and Oncology

## 2013-09-02 ENCOUNTER — Other Ambulatory Visit: Payer: Self-pay | Admitting: Oncology

## 2013-09-02 DIAGNOSIS — D72819 Decreased white blood cell count, unspecified: Secondary | ICD-10-CM

## 2013-09-04 ENCOUNTER — Encounter: Payer: Self-pay | Admitting: Internal Medicine

## 2013-09-04 ENCOUNTER — Other Ambulatory Visit (HOSPITAL_BASED_OUTPATIENT_CLINIC_OR_DEPARTMENT_OTHER): Payer: Commercial Managed Care - PPO | Admitting: Lab

## 2013-09-04 ENCOUNTER — Ambulatory Visit (HOSPITAL_BASED_OUTPATIENT_CLINIC_OR_DEPARTMENT_OTHER): Payer: Commercial Managed Care - PPO | Admitting: Internal Medicine

## 2013-09-04 VITALS — BP 145/78 | HR 85 | Temp 98.0°F | Resp 20 | Ht 63.0 in | Wt 173.5 lb

## 2013-09-04 DIAGNOSIS — D72819 Decreased white blood cell count, unspecified: Secondary | ICD-10-CM

## 2013-09-04 DIAGNOSIS — E039 Hypothyroidism, unspecified: Secondary | ICD-10-CM

## 2013-09-04 LAB — CBC WITH DIFFERENTIAL/PLATELET
Basophils Absolute: 0 10*3/uL (ref 0.0–0.1)
EOS%: 1.3 % (ref 0.0–7.0)
HCT: 37.8 % (ref 34.8–46.6)
HGB: 12.6 g/dL (ref 11.6–15.9)
MCH: 27.9 pg (ref 25.1–34.0)
MCV: 83.9 fL (ref 79.5–101.0)
MONO%: 8.9 % (ref 0.0–14.0)
NEUT%: 49.5 % (ref 38.4–76.8)
RDW: 13.2 % (ref 11.2–14.5)

## 2013-09-04 NOTE — Progress Notes (Signed)
[facility] OFFICE PROGRESS NOTE  Millsaps, Joelene Millin, NP [facility] [address] [place] [address]  DIAGNOSIS: Leucopenia - Plan: CBC with Differential, Comprehensive metabolic panel  Chief Complaint  Patient presents with  . Leukopenia    CURRENT THERAPY: None  INTERVAL HISTORY: Maria Simmons 47 y.o. female with a history of leukopenia here for a one-year followup.  She was last seen by Dr. Dalene Carrow on 09/02/2012. She is status post total thyroidectomy, radioiodine therapy for follicular papillary carcinoma in 200. Her hypothyroidism is closely followed by her endocrinology Dr. Talmage Nap. No history of fever, chills or night sweats.   MEDICAL HISTORY: Past Medical History  Diagnosis Date  . Thyroid disease   . Cancer   . Diabetes mellitus   . Hypercholesterolemia     INTERIM HISTORY: has Leucopenia on her problem list.    ALLERGIES:  is allergic to metformin and related; tape; and penicillins.  MEDICATIONS: has a current medication list which includes the following prescription(s): aspirin ec, biotin, calcium-vitamin d, levothyroxine, and multivitamin with minerals.  SURGICAL HISTORY:  Past Surgical History  Procedure Laterality Date  . 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  . Laparoscopic gastric banding  2010    REVIEW OF SYSTEMS:   Constitutional: Denies fevers, chills or abnormal weight loss Eyes: Denies blurriness of vision Ears, nose, mouth, throat, and face: Denies mucositis or sore throat Respiratory: Denies cough, dyspnea or wheezes Cardiovascular: Denies palpitation, chest discomfort or lower extremity swelling Gastrointestinal:  Denies nausea, heartburn or change in bowel habits Skin: Denies abnormal skin rashes Lymphatics: Denies new lymphadenopathy or easy bruising Neurological:Denies numbness, tingling or new weaknesses Behavioral/Psych: Mood is stable,  no new changes  All other systems were reviewed with the patient and are negative.  PHYSICAL EXAMINATION: ECOG PERFORMANCE STATUS: 0 - Asymptomatic  Blood pressure 145/78, pulse 85, temperature 98 F (36.7 C), temperature source Oral, resp. rate 20, height 5\' 3"  (1.6 m), weight 173 lb 8 oz (78.699 kg).  GENERAL:alert, no distress and comfortable SKIN: skin color, texture, turgor are normal, no rashes or significant lesions EYES: normal, Conjunctiva are pink and non-injected, sclera clear OROPHARYNX:no exudate, no erythema and lips, buccal mucosa, and tongue normal  NECK: supple, thyroidectomy scar well healed,  non-tender, without nodularity LYMPH:  no palpable lymphadenopathy in the cervical or axillary LUNGS: clear to auscultation and percussion with normal breathing effort HEART: regular rate & rhythm and no murmurs and no lower extremity edema ABDOMEN:abdomen soft, non-tender and normal bowel sounds Musculoskeletal:no peripheral edema NEURO: alert & oriented x 3 with fluent speech, no focal motor/sensory deficits   LABORATORY DATA: Results for orders placed in visit on 09/04/13 (from the past 48 hour(s))  CBC WITH DIFFERENTIAL     Status: None   Collection Time    09/04/13 11:17 AM      Result Value Range   WBC 4.3  3.9 - 10.3 10e3/uL   NEUT# 2.1  1.5 - 6.5 10e3/uL   HGB 12.6  11.6 - 15.9 g/dL   HCT 60.4  54.0 - 98.1 %   Platelets 291  145 - 400 10e3/uL   MCV 83.9  79.5 - 101.0 fL   MCH 27.9  25.1 - 34.0 pg   MCHC 33.2  31.5 - 36.0 g/dL   RBC 1.91  4.78 - 2.95 10e6/uL   RDW 13.2  11.2 - 14.5 %   lymph# 1.7  0.9 -  3.3 10e3/uL   MONO# 0.4  0.1 - 0.9 10e3/uL   Eosinophils Absolute 0.1  0.0 - 0.5 10e3/uL   Basophils Absolute 0.0  0.0 - 0.1 10e3/uL   NEUT% 49.5  38.4 - 76.8 %   LYMPH% 39.8  14.0 - 49.7 %   MONO% 8.9  0.0 - 14.0 %   EOS% 1.3  0.0 - 7.0 %   BASO% 0.5  0.0 - 2.0 %  MORPHOLOGY     Status: None   Collection Time    09/04/13 11:17 AM      Result Value Range    Tear Drop Cells Few  Negative   White Cell Comments Variant Lymphs     PLT EST Adequate  Adequate    CMP     Component Value Date/Time   NA 139 08/31/2012 1336   NA 142 03/15/2012 2004   K 4.3 08/31/2012 1336   K 3.7 03/15/2012 2004   CL 102 08/31/2012 1336   CL 104 03/15/2012 2004   CO2 30* 08/31/2012 1336   CO2 30 03/15/2012 2004   GLUCOSE 95 08/31/2012 1336   GLUCOSE 106* 03/15/2012 2004   BUN 11.0 08/31/2012 1336   BUN 11 03/15/2012 2004   CREATININE 0.9 08/31/2012 1336   CREATININE 0.84 03/15/2012 2004   CALCIUM 9.4 08/31/2012 1336   CALCIUM 8.8 03/15/2012 2004   PROT 6.6 03/15/2012 2004   ALBUMIN 3.7 03/15/2012 2004   AST 17 03/15/2012 2004   ALT 12 03/15/2012 2004   ALKPHOS 38* 03/15/2012 2004   BILITOT 0.2* 03/15/2012 2004   GFRNONAA 81* 03/15/2012 2004   GFRAA >90 03/15/2012 2004       RADIOGRAPHIC STUDIES: No results found.  ASSESSMENT: Leukopenia   PLAN:  -- She remains asymptomatic albeit is undergoing adjustment of her thyroid medications for hypothyroidism. She is status post thyroidectomy for papillary follicular carcinoma and status post radioiodine. Will continue to monitor.  --RTC in one year for repeat CBC, CMP  All questions were answered. The patient knows to call the clinic with any problems, questions or concerns. We can certainly see the patient much sooner if necessary.  I spent 15 minutes counseling the patient face to face. The total time spent in the appointment was 30 minutes.    Kamora Vossler, MD 09/04/2013 10:37 PM

## 2013-09-07 ENCOUNTER — Telehealth: Payer: Self-pay | Admitting: Internal Medicine

## 2013-10-19 ENCOUNTER — Other Ambulatory Visit: Payer: Self-pay

## 2014-06-06 ENCOUNTER — Other Ambulatory Visit: Payer: Self-pay

## 2014-06-06 DIAGNOSIS — Z1231 Encounter for screening mammogram for malignant neoplasm of breast: Secondary | ICD-10-CM

## 2014-06-11 ENCOUNTER — Ambulatory Visit
Admission: RE | Admit: 2014-06-11 | Discharge: 2014-06-11 | Disposition: A | Discharge status: Home / Self Care | Payer: 59 | Source: Ambulatory Visit

## 2014-06-11 DIAGNOSIS — Z1231 Encounter for screening mammogram for malignant neoplasm of breast: Secondary | ICD-10-CM

## 2014-06-21 ENCOUNTER — Ambulatory Visit: Payer: Self-pay | Admitting: Nurse Practitioner

## 2014-06-27 ENCOUNTER — Emergency Department (HOSPITAL_COMMUNITY)
Admission: EM | Admit: 2014-06-27 | Discharge: 2014-06-27 | Disposition: A | Discharge status: Home / Self Care | Payer: 59 | Source: Home / Self Care | Attending: Family Medicine | Admitting: Family Medicine

## 2014-06-27 ENCOUNTER — Encounter (HOSPITAL_COMMUNITY): Payer: Self-pay | Admitting: Emergency Medicine

## 2014-06-27 ENCOUNTER — Emergency Department (INDEPENDENT_AMBULATORY_CARE_PROVIDER_SITE_OTHER): Payer: 59

## 2014-06-27 DIAGNOSIS — M25552 Pain in left hip: Secondary | ICD-10-CM

## 2014-06-27 DIAGNOSIS — M25559 Pain in unspecified hip: Secondary | ICD-10-CM

## 2014-06-27 HISTORY — DX: Malignant neoplasm of thyroid gland: C73

## 2014-06-27 MED ORDER — DICLOFENAC SODIUM 50 MG PO TBEC: 50.0000 mg | DELAYED_RELEASE_TABLET | Freq: Two times a day (BID) | ORAL | Status: DC | PRN

## 2014-06-27 NOTE — ED Provider Notes (Signed)
Maria Simmons is a 48 y.o. female who presents to Urgent Care today for hip pain. Patient notes pain in the left anterior hip is been present for multiple months and  worsening over the past several weeks. The pain is worse with hip flexion and rotation. No radiating pain weakness or numbness. No abdominal pain fevers chills nausea vomiting or diarrhea.   Past Medical History  Diagnosis Date  . Cancer   . Diabetes mellitus   . Hypercholesterolemia   . Thyroid cancer    History  Substance Use Topics  . Smoking status: Never Smoker   . Smokeless tobacco: Not on file  . Alcohol Use: No   ROS as above Medications: No current facility-administered medications for this encounter.   Current Outpatient Prescriptions  Medication Sig Dispense Refill  . aspirin EC 81 MG tablet Take 81 mg by mouth daily.      . Biotin 10 MG TABS Take 1 tablet by mouth daily.      . Cyanocobalamin (VITAMIN B 12 PO) Take by mouth.      . levothyroxine (SYNTHROID, LEVOTHROID) 125 MCG tablet Take 175 mcg by mouth daily.       . Multiple Vitamin (MULITIVITAMIN WITH MINERALS) TABS Take 1 tablet by mouth daily.      . Vitamin D, Cholecalciferol, 1000 UNITS TABS Take 2,000 Int'l Units by mouth.      . diclofenac (VOLTAREN) 50 MG EC tablet Take 1 tablet (50 mg total) by mouth 2 (two) times daily as needed.  60 tablet  0  . [DISCONTINUED] calcium-vitamin D (OSCAL WITH D) 500-200 MG-UNIT per tablet Take 1 tablet by mouth daily.        Exam:  BP 161/86  Pulse 92  Temp(Src) 98 F (36.7 C) (Oral)  Resp 18  SpO2 100% Gen: Well NAD Back: Nontender to spinal midline Left hip: Nontender laterally and posteriorly. Decreased internal rotation range of motion with pain. Decreased flexion with pain. Contralateral right hip is normal-appearing nontender with full motion Knees bilaterally are nontender with full motion but stable ligamentous exam Pulses capillary refill and sensation are intact distally   No results  found for this or any previous visit (from the past 24 hour(s)). Dg Hip Complete Left  06/27/2014   CLINICAL DATA:  Left hip pain for several months getting worse  EXAM: LEFT HIP - COMPLETE 2+ VIEW  COMPARISON:  None.  FINDINGS: Pelvic bones are intact. Right hip joint appears normal. On the left, there is a is a rather prominent lateral osteophyte versus labral calcification. No evidence of femur fracture or dislocation.  IMPRESSION: No acute findings. Degenerative changes suggesting osteophyte formation or labral calcification left hip joint.   Electronically Signed   By: Skipper Cliche M.D.   On: 06/27/2014 19:13    Assessment and Plan: 48 y.o. 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 Plan to treat with diclofenac for pain control. Refer to Dr. Mayer Camel at [facility] for further workup and evaluation. May need MRI arthrogram of the left hip to further evaluate possibility of labrum tear. May also benefit from diagnostic and therapeutic corticosteroid injection.  Discussed warning signs or symptoms. Please see discharge instructions. Patient expresses understanding.    Gregor Hams, MD 06/27/14 [phone]

## 2014-06-27 NOTE — ED Notes (Signed)
C/o L hip/groin pain for months but worse in past 2 weeks.  Pain is sharp like a knife. Hx. DDD.

## 2014-06-27 NOTE — Discharge Instructions (Signed)
Thank you for coming in today. Use voltaren for pain as needed.   Follow up with Dr. Mayer Camel

## 2014-08-03 DIAGNOSIS — M722 Plantar fascial fibromatosis: Secondary | ICD-10-CM

## 2014-09-02 ENCOUNTER — Emergency Department (INDEPENDENT_AMBULATORY_CARE_PROVIDER_SITE_OTHER): Payer: 59

## 2014-09-02 ENCOUNTER — Emergency Department (HOSPITAL_COMMUNITY)
Admission: EM | Admit: 2014-09-02 | Discharge: 2014-09-02 | Disposition: A | Discharge status: Home / Self Care | Payer: 59 | Source: Home / Self Care

## 2014-09-02 ENCOUNTER — Encounter (HOSPITAL_COMMUNITY): Payer: Self-pay | Admitting: Emergency Medicine

## 2014-09-02 DIAGNOSIS — S82109A Unspecified fracture of upper end of unspecified tibia, initial encounter for closed fracture: Secondary | ICD-10-CM

## 2014-09-02 DIAGNOSIS — S82142A Displaced bicondylar fracture of left tibia, initial encounter for closed fracture: Secondary | ICD-10-CM

## 2014-09-02 NOTE — ED Provider Notes (Signed)
Medical screening examination/treatment/procedure(s) were performed by a resident physician or non-physician practitioner and as the supervising physician I was immediately available for consultation/collaboration.  Tanique Matney, MD    Malerie Eakins S Morio Widen, MD 09/02/14 1307 
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
## 2014-09-05 ENCOUNTER — Ambulatory Visit (HOSPITAL_BASED_OUTPATIENT_CLINIC_OR_DEPARTMENT_OTHER): Payer: 59 | Admitting: Hematology

## 2014-09-05 ENCOUNTER — Telehealth: Payer: Self-pay | Admitting: Hematology

## 2014-09-05 ENCOUNTER — Other Ambulatory Visit (HOSPITAL_BASED_OUTPATIENT_CLINIC_OR_DEPARTMENT_OTHER): Payer: 59

## 2014-09-05 VITALS — BP 138/68 | HR 72 | Temp 97.6°F | Resp 20 | Ht 63.0 in | Wt 179.7 lb

## 2014-09-05 DIAGNOSIS — R220 Localized swelling, mass and lump, head: Secondary | ICD-10-CM

## 2014-09-05 DIAGNOSIS — Z85850 Personal history of malignant neoplasm of thyroid: Secondary | ICD-10-CM

## 2014-09-05 DIAGNOSIS — D72819 Decreased white blood cell count, unspecified: Secondary | ICD-10-CM

## 2014-09-05 DIAGNOSIS — E039 Hypothyroidism, unspecified: Secondary | ICD-10-CM

## 2014-09-05 DIAGNOSIS — R221 Localized swelling, mass and lump, neck: Secondary | ICD-10-CM

## 2014-09-05 LAB — CBC WITH DIFFERENTIAL/PLATELET
BASO%: 0.7 % (ref 0.0–2.0)
BASOS ABS: 0 10*3/uL (ref 0.0–0.1)
EOS ABS: 0.2 10*3/uL (ref 0.0–0.5)
EOS%: 4.1 % (ref 0.0–7.0)
HEMATOCRIT: 35.1 % (ref 34.8–46.6)
HEMOGLOBIN: 11.3 g/dL — AB (ref 11.6–15.9)
LYMPH%: 31.3 % (ref 14.0–49.7)
MCH: 27.2 pg (ref 25.1–34.0)
MCHC: 32.1 g/dL (ref 31.5–36.0)
MCV: 84.5 fL (ref 79.5–101.0)
MONO#: 0.3 10*3/uL (ref 0.1–0.9)
MONO%: 6.7 % (ref 0.0–14.0)
NEUT%: 57.2 % (ref 38.4–76.8)
NEUTROS ABS: 2.2 10*3/uL (ref 1.5–6.5)
Platelets: 393 10*3/uL (ref 145–400)
RBC: 4.15 10*6/uL (ref 3.70–5.45)
RDW: 13.3 % (ref 11.2–14.5)
WBC: 3.8 10*3/uL — ABNORMAL LOW (ref 3.9–10.3)
lymph#: 1.2 10*3/uL (ref 0.9–3.3)

## 2014-09-05 LAB — COMPREHENSIVE METABOLIC PANEL (CC13)
ALBUMIN: 3.4 g/dL — AB (ref 3.5–5.0)
ALK PHOS: 80 U/L (ref 40–150)
ALT: 11 U/L (ref 0–55)
AST: 14 U/L (ref 5–34)
Anion Gap: 6 mEq/L (ref 3–11)
BUN: 10.2 mg/dL (ref 7.0–26.0)
CO2: 31 mEq/L — ABNORMAL HIGH (ref 22–29)
CREATININE: 0.8 mg/dL (ref 0.6–1.1)
Calcium: 9.6 mg/dL (ref 8.4–10.4)
Chloride: 105 mEq/L (ref 98–109)
GLUCOSE: 111 mg/dL (ref 70–140)
POTASSIUM: 3.9 meq/L (ref 3.5–5.1)
Sodium: 142 mEq/L (ref 136–145)
Total Bilirubin: 0.55 mg/dL (ref 0.20–1.20)
Total Protein: 7.2 g/dL (ref 6.4–8.3)

## 2014-09-05 NOTE — Telephone Encounter (Signed)
Pt confirmed labs/ov per 09/23 POF, gave pt AVS....KJ °

## 2014-09-06 ENCOUNTER — Encounter: Payer: Self-pay | Admitting: Hematology

## 2014-09-06 ENCOUNTER — Ambulatory Visit (HOSPITAL_COMMUNITY)
Admission: RE | Admit: 2014-09-06 | Discharge: 2014-09-06 | Disposition: A | Discharge status: Home / Self Care | Payer: 59 | Source: Ambulatory Visit | Attending: Hematology | Admitting: Hematology

## 2014-09-06 DIAGNOSIS — R220 Localized swelling, mass and lump, head: Secondary | ICD-10-CM | POA: Insufficient documentation

## 2014-09-06 DIAGNOSIS — Z85850 Personal history of malignant neoplasm of thyroid: Secondary | ICD-10-CM | POA: Insufficient documentation

## 2014-09-06 DIAGNOSIS — R221 Localized swelling, mass and lump, neck: Principal | ICD-10-CM
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
## 2014-09-10 ENCOUNTER — Other Ambulatory Visit: Payer: Self-pay

## 2014-09-10 ENCOUNTER — Other Ambulatory Visit: Payer: Self-pay | Admitting: Hematology

## 2014-09-10 DIAGNOSIS — Z85850 Personal history of malignant neoplasm of thyroid: Secondary | ICD-10-CM

## 2014-09-12 ENCOUNTER — Telehealth: Payer: Self-pay | Admitting: Hematology

## 2014-09-12 NOTE — Telephone Encounter (Signed)
MD at [facility] advise that pt needs to go to ENT...emailed Dr. Arlys John this info

## 2014-09-12 NOTE — Telephone Encounter (Signed)
Per Otila Kluver nurse will review info they decide if pt needs to see ENT. They will contact me and pt once decision made.

## 2014-09-14 ENCOUNTER — Ambulatory Visit (HOSPITAL_COMMUNITY)
Admission: RE | Admit: 2014-09-14 | Discharge: 2014-09-14 | Disposition: A | Discharge status: Home / Self Care | Payer: 59 | Source: Ambulatory Visit | Attending: Hematology | Admitting: Hematology

## 2014-09-14 ENCOUNTER — Telehealth: Payer: Self-pay | Admitting: *Deleted

## 2014-09-14 DIAGNOSIS — C73 Malignant neoplasm of thyroid gland: Secondary | ICD-10-CM | POA: Diagnosis not present

## 2014-09-14 DIAGNOSIS — Z85850 Personal history of malignant neoplasm of thyroid: Secondary | ICD-10-CM

## 2014-09-14 LAB — GLUCOSE, CAPILLARY: GLUCOSE-CAPILLARY: 102 mg/dL — AB (ref 70–99)

## 2014-09-14 MED ORDER — FLUDEOXYGLUCOSE F - 18 (FDG) INJECTION
9.7000 | Freq: Once | INTRAVENOUS | Status: AC | PRN
  Administered 2014-09-14: 9.7 via INTRAVENOUS

## 2014-09-14 NOTE — Telephone Encounter (Addendum)
Received call from pt stating that she just spoke with Dr Lona Kettle & one of the physicians she works with reccommended Dr. Melony Overly ENT since [facility] does not do ENT.  This message will be given to Dr Lona Kettle for possible referral.  Spoke with Dr.Sehbai who states that pt is not new with [facility] & has seen Dr Dalbert Batman for thyroid surg.  He also has spoken to Dr. Dalbert Batman & pt should be seen there & not necessarily ENT.  Called [facility] & referral made & appt made for 10/20 but will see if this can be moved sooner.  Pt notified via vm of plan & [facility] will call pt.

## 2014-10-02 ENCOUNTER — Other Ambulatory Visit (INDEPENDENT_AMBULATORY_CARE_PROVIDER_SITE_OTHER): Payer: Self-pay | Admitting: General Surgery

## 2014-10-02 ENCOUNTER — Other Ambulatory Visit (HOSPITAL_COMMUNITY)
Admission: RE | Admit: 2014-10-02 | Discharge: 2014-10-02 | Disposition: A | Discharge status: Home / Self Care | Payer: 59 | Source: Ambulatory Visit | Attending: General Surgery | Admitting: General Surgery

## 2014-10-02 DIAGNOSIS — Z85850 Personal history of malignant neoplasm of thyroid: Secondary | ICD-10-CM | POA: Diagnosis present

## 2014-10-04 ENCOUNTER — Telehealth (INDEPENDENT_AMBULATORY_CARE_PROVIDER_SITE_OTHER): Payer: Self-pay

## 2014-10-04 NOTE — Telephone Encounter (Signed)
Pt was calling to see if her path results were in. Informed pt that they were in and I would send a message to Dr Dalbert Batman to have him review and we would contact her as soon as we received a response. Pt verbalized understanding

## 2014-10-05 ENCOUNTER — Telehealth (INDEPENDENT_AMBULATORY_CARE_PROVIDER_SITE_OTHER): Payer: Self-pay | Admitting: General Surgery

## 2014-10-05 ENCOUNTER — Other Ambulatory Visit (INDEPENDENT_AMBULATORY_CARE_PROVIDER_SITE_OTHER): Payer: Self-pay | Admitting: General Surgery

## 2014-10-05 NOTE — Telephone Encounter (Signed)
FEN of left neck mass shows atypical large lymphatic cells. Dr. Gari Crown  feels that this is suspicious for a lymphoproliferative disorder, possible lymphoma. I called the patient and discussed this with her. I told her that the next steps was an excisional biopsy of one of the left neck lymph nodes under anesthesia in the operating room. She understands and agrees and we will set this up.   Edsel Petrin. Dalbert Batman, M.D., [facility] General and Minimally invasive Surgery Breast and Colorectal Surgery Office:   [phone] Pager:   [phone]

## 2014-10-10 ENCOUNTER — Encounter (HOSPITAL_BASED_OUTPATIENT_CLINIC_OR_DEPARTMENT_OTHER): Payer: Self-pay | Admitting: *Deleted
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
## 2014-10-10 NOTE — Progress Notes (Signed)
No labs needed-had labs 09/05/14

## 2014-10-12 ENCOUNTER — Encounter (HOSPITAL_BASED_OUTPATIENT_CLINIC_OR_DEPARTMENT_OTHER): Payer: 59 | Admitting: Anesthesiology

## 2014-10-12 ENCOUNTER — Encounter (HOSPITAL_BASED_OUTPATIENT_CLINIC_OR_DEPARTMENT_OTHER): Payer: Self-pay | Admitting: *Deleted

## 2014-10-12 ENCOUNTER — Ambulatory Visit (HOSPITAL_BASED_OUTPATIENT_CLINIC_OR_DEPARTMENT_OTHER): Payer: 59 | Admitting: Anesthesiology

## 2014-10-12 ENCOUNTER — Encounter (HOSPITAL_BASED_OUTPATIENT_CLINIC_OR_DEPARTMENT_OTHER)
Admission: RE | Disposition: A | Discharge status: Home / Self Care | Payer: Self-pay | Source: Ambulatory Visit | Attending: General Surgery

## 2014-10-12 ENCOUNTER — Ambulatory Visit (HOSPITAL_BASED_OUTPATIENT_CLINIC_OR_DEPARTMENT_OTHER)
Admission: RE | Admit: 2014-10-12 | Discharge: 2014-10-12 | Disposition: A | Discharge status: Home / Self Care | Payer: 59 | Source: Ambulatory Visit | Attending: General Surgery | Admitting: General Surgery

## 2014-10-12 DIAGNOSIS — C8330 Diffuse large B-cell lymphoma, unspecified site: Secondary | ICD-10-CM | POA: Insufficient documentation

## 2014-10-12 DIAGNOSIS — R599 Enlarged lymph nodes, unspecified: Secondary | ICD-10-CM | POA: Diagnosis present

## 2014-10-12 DIAGNOSIS — R590 Localized enlarged lymph nodes: Secondary | ICD-10-CM

## 2014-10-12 DIAGNOSIS — Z85850 Personal history of malignant neoplasm of thyroid: Secondary | ICD-10-CM | POA: Insufficient documentation

## 2014-10-12 HISTORY — DX: Other specified postprocedural states: R11.2

## 2014-10-12 HISTORY — DX: Presence of spectacles and contact lenses: Z97.3

## 2014-10-12 HISTORY — DX: Other specified postprocedural states: Z98.890

## 2014-10-12 HISTORY — DX: Hypothyroidism, unspecified: E03.9

## 2014-10-12 HISTORY — PX: LYMPH NODE BIOPSY: SHX201

## 2014-10-12 LAB — POCT HEMOGLOBIN-HEMACUE: Hemoglobin: 12.5 g/dL (ref 12.0–15.0)

## 2014-10-12 SURGERY — LYMPH NODE BIOPSY
Anesthesia: General | Site: Neck | Laterality: Left

## 2014-10-12 MED ORDER — MIDAZOLAM HCL 2 MG/2ML IJ SOLN
INTRAMUSCULAR | Status: AC
  Filled 2014-10-12: qty 2

## 2014-10-12 MED ORDER — SODIUM CHLORIDE 0.9 % IJ SOLN: 3.0000 mL | Freq: Two times a day (BID) | INTRAMUSCULAR | Status: DC

## 2014-10-12 MED ORDER — FENTANYL CITRATE 0.05 MG/ML IJ SOLN: 50.0000 ug | INTRAMUSCULAR | Status: DC | PRN

## 2014-10-12 MED ORDER — OXYCODONE HCL 5 MG PO TABS: 5.0000 mg | ORAL_TABLET | ORAL | Status: DC | PRN

## 2014-10-12 MED ORDER — MIDAZOLAM HCL 5 MG/5ML IJ SOLN
INTRAMUSCULAR | Status: DC | PRN
  Administered 2014-10-12: 2 mg via INTRAVENOUS

## 2014-10-12 MED ORDER — FENTANYL CITRATE 0.05 MG/ML IJ SOLN
INTRAMUSCULAR | Status: AC
  Filled 2014-10-12: qty 4

## 2014-10-12 MED ORDER — CEFAZOLIN SODIUM-DEXTROSE 2-3 GM-% IV SOLR
2.0000 g | INTRAVENOUS | Status: AC
  Administered 2014-10-12: 2 g via INTRAVENOUS

## 2014-10-12 MED ORDER — BUPIVACAINE-EPINEPHRINE 0.5% -1:200000 IJ SOLN
INTRAMUSCULAR | Status: DC | PRN
  Administered 2014-10-12: 4 mL

## 2014-10-12 MED ORDER — SUCCINYLCHOLINE CHLORIDE 20 MG/ML IJ SOLN
INTRAMUSCULAR | Status: DC | PRN
  Administered 2014-10-12: 100 mg via INTRAVENOUS

## 2014-10-12 MED ORDER — HYDROCODONE-ACETAMINOPHEN 5-325 MG PO TABS: 1.0000 | ORAL_TABLET | Freq: Four times a day (QID) | ORAL | Status: DC | PRN

## 2014-10-12 MED ORDER — ONDANSETRON HCL 4 MG/2ML IJ SOLN: 4.0000 mg | Freq: Once | INTRAMUSCULAR | Status: DC | PRN

## 2014-10-12 MED ORDER — OXYCODONE HCL 5 MG PO TABS
5.0000 mg | ORAL_TABLET | Freq: Once | ORAL | Status: AC | PRN
  Administered 2014-10-12: 5 mg via ORAL

## 2014-10-12 MED ORDER — LACTATED RINGERS IV SOLN
INTRAVENOUS | Status: DC
  Administered 2014-10-12 (×2): via INTRAVENOUS

## 2014-10-12 MED ORDER — PROPOFOL 10 MG/ML IV BOLUS
INTRAVENOUS | Status: DC | PRN
  Administered 2014-10-12: 200 mg via INTRAVENOUS

## 2014-10-12 MED ORDER — DEXAMETHASONE SODIUM PHOSPHATE 4 MG/ML IJ SOLN
INTRAMUSCULAR | Status: DC | PRN
  Administered 2014-10-12: 10 mg via INTRAVENOUS

## 2014-10-12 MED ORDER — OXYCODONE HCL 5 MG/5ML PO SOLN: 5.0000 mg | Freq: Once | ORAL | Status: AC | PRN

## 2014-10-12 MED ORDER — OXYCODONE HCL 5 MG PO TABS
ORAL_TABLET | ORAL | Status: AC
  Filled 2014-10-12: qty 1

## 2014-10-12 MED ORDER — SODIUM CHLORIDE 0.9 % IV SOLN: 250.0000 mL | INTRAVENOUS | Status: DC | PRN

## 2014-10-12 MED ORDER — SCOPOLAMINE 1 MG/3DAYS TD PT72
1.0000 | MEDICATED_PATCH | TRANSDERMAL | Status: DC
  Administered 2014-10-12: 1.5 mg via TRANSDERMAL

## 2014-10-12 MED ORDER — LIDOCAINE HCL (CARDIAC) 20 MG/ML IV SOLN
INTRAVENOUS | Status: DC | PRN
  Administered 2014-10-12: 80 mg via INTRAVENOUS

## 2014-10-12 MED ORDER — BUPIVACAINE-EPINEPHRINE (PF) 0.5% -1:200000 IJ SOLN
INTRAMUSCULAR | Status: AC
  Filled 2014-10-12: qty 30

## 2014-10-12 MED ORDER — CEFAZOLIN SODIUM-DEXTROSE 2-3 GM-% IV SOLR
INTRAVENOUS | Status: AC
  Filled 2014-10-12: qty 50

## 2014-10-12 MED ORDER — FENTANYL CITRATE 0.05 MG/ML IJ SOLN
INTRAMUSCULAR | Status: DC | PRN
  Administered 2014-10-12 (×2): 50 ug via INTRAVENOUS

## 2014-10-12 MED ORDER — ONDANSETRON HCL 4 MG/2ML IJ SOLN
INTRAMUSCULAR | Status: DC | PRN
  Administered 2014-10-12: 4 mg via INTRAVENOUS

## 2014-10-12 MED ORDER — EPHEDRINE SULFATE 50 MG/ML IJ SOLN
INTRAMUSCULAR | Status: DC | PRN
  Administered 2014-10-12 (×2): 10 mg via INTRAVENOUS

## 2014-10-12 MED ORDER — MIDAZOLAM HCL 2 MG/2ML IJ SOLN: 1.0000 mg | INTRAMUSCULAR | Status: DC | PRN

## 2014-10-12 MED ORDER — PROPOFOL 10 MG/ML IV BOLUS
INTRAVENOUS | Status: AC
  Filled 2014-10-12: qty 20

## 2014-10-12 MED ORDER — HYDROMORPHONE HCL 1 MG/ML IJ SOLN
0.2500 mg | INTRAMUSCULAR | Status: DC | PRN
  Administered 2014-10-12: 0.5 mg via INTRAVENOUS

## 2014-10-12 MED ORDER — SCOPOLAMINE 1 MG/3DAYS TD PT72
MEDICATED_PATCH | TRANSDERMAL | Status: AC
  Filled 2014-10-12: qty 1

## 2014-10-12 MED ORDER — SODIUM CHLORIDE 0.9 % IJ SOLN: 3.0000 mL | INTRAMUSCULAR | Status: DC | PRN

## 2014-10-12 MED ORDER — CHLORHEXIDINE GLUCONATE 4 % EX LIQD: 1.0000 "application " | Freq: Once | CUTANEOUS | Status: DC

## 2014-10-12 MED ORDER — SODIUM CHLORIDE 0.9 % IV SOLN: INTRAVENOUS | Status: DC

## 2014-10-12 MED ORDER — ACETAMINOPHEN 325 MG PO TABS: 650.0000 mg | ORAL_TABLET | ORAL | Status: DC | PRN

## 2014-10-12 MED ORDER — HYDROMORPHONE HCL 1 MG/ML IJ SOLN
INTRAMUSCULAR | Status: AC
  Filled 2014-10-12: qty 1

## 2014-10-12 MED ORDER — ACETAMINOPHEN 650 MG RE SUPP: 650.0000 mg | RECTAL | Status: DC | PRN

## 2014-10-12 MED ORDER — FENTANYL CITRATE 0.05 MG/ML IJ SOLN: 25.0000 ug | INTRAMUSCULAR | Status: DC | PRN

## 2014-10-12 SURGICAL SUPPLY — 59 items
APL SKNCLS STERI-STRIP NONHPOA (GAUZE/BANDAGES/DRESSINGS)
APPLIER CLIP 9.375 MED OPEN (MISCELLANEOUS)
APR CLP MED 9.3 20 MLT OPN (MISCELLANEOUS)
BANDAGE ELASTIC 6 VELCRO ST LF (GAUZE/BANDAGES/DRESSINGS) IMPLANT
BENZOIN TINCTURE PRP APPL 2/3 (GAUZE/BANDAGES/DRESSINGS) IMPLANT
BLADE HEX COATED 2.75 (ELECTRODE) ×2 IMPLANT
BLADE SURG 15 STRL LF DISP TIS (BLADE) ×2 IMPLANT
BLADE SURG 15 STRL SS (BLADE) ×2
CANISTER SUCT 1200ML W/VALVE (MISCELLANEOUS) ×1 IMPLANT
CHLORAPREP W/TINT 26ML (MISCELLANEOUS) ×2 IMPLANT
CLIP APPLIE 9.375 MED OPEN (MISCELLANEOUS) ×1 IMPLANT
COVER BACK TABLE 60X90IN (DRAPES) ×2 IMPLANT
COVER MAYO STAND STRL (DRAPES) ×2 IMPLANT
DECANTER SPIKE VIAL GLASS SM (MISCELLANEOUS) IMPLANT
DEVICE DUBIN W/COMP PLATE 8390 (MISCELLANEOUS) IMPLANT
DRAPE LAPAROTOMY TRNSV 102X78 (DRAPE) IMPLANT
DRAPE PED LAPAROTOMY (DRAPES) ×2 IMPLANT
DRAPE UTILITY XL STRL (DRAPES) ×2 IMPLANT
ELECT REM PT RETURN 9FT ADLT (ELECTROSURGICAL) ×2
ELECTRODE REM PT RTRN 9FT ADLT (ELECTROSURGICAL) ×1 IMPLANT
GAUZE SPONGE 4X4 16PLY XRAY LF (GAUZE/BANDAGES/DRESSINGS) IMPLANT
GLOVE BIOGEL PI IND STRL 7.0 (GLOVE) IMPLANT
GLOVE BIOGEL PI INDICATOR 7.0 (GLOVE) ×1
GLOVE ECLIPSE 7.0 STRL STRAW (GLOVE) ×1 IMPLANT
GLOVE EUDERMIC 7 POWDERFREE (GLOVE) ×2 IMPLANT
GOWN STRL REUS W/ TWL LRG LVL3 (GOWN DISPOSABLE) ×1 IMPLANT
GOWN STRL REUS W/ TWL XL LVL3 (GOWN DISPOSABLE) ×1 IMPLANT
GOWN STRL REUS W/TWL LRG LVL3 (GOWN DISPOSABLE) ×2
GOWN STRL REUS W/TWL XL LVL3 (GOWN DISPOSABLE) ×2
HEMOSTAT SNOW SURGICEL 2X4 (HEMOSTASIS) ×1 IMPLANT
KIT MARKER MARGIN INK (KITS) IMPLANT
LIQUID BAND (GAUZE/BANDAGES/DRESSINGS) ×1 IMPLANT
NDL HYPO 25X1 1.5 SAFETY (NEEDLE) ×1 IMPLANT
NEEDLE HYPO 22GX1.5 SAFETY (NEEDLE) IMPLANT
NEEDLE HYPO 25X1 1.5 SAFETY (NEEDLE) ×2 IMPLANT
NS IRRIG 1000ML POUR BTL (IV SOLUTION) ×2 IMPLANT
PACK BASIN DAY SURGERY FS (CUSTOM PROCEDURE TRAY) ×2 IMPLANT
PENCIL BUTTON HOLSTER BLD 10FT (ELECTRODE) ×2 IMPLANT
SLEEVE SCD COMPRESS KNEE MED (MISCELLANEOUS) ×1 IMPLANT
SPONGE GAUZE 4X4 12PLY STER LF (GAUZE/BANDAGES/DRESSINGS) IMPLANT
SPONGE LAP 4X18 X RAY DECT (DISPOSABLE) ×1 IMPLANT
STAPLER VISISTAT 35W (STAPLE) IMPLANT
STRIP CLOSURE SKIN 1/2X4 (GAUZE/BANDAGES/DRESSINGS) IMPLANT
SUT ETHILON 4 0 PS 2 18 (SUTURE) IMPLANT
SUT MNCRL AB 4-0 PS2 18 (SUTURE) ×1 IMPLANT
SUT SILK 2 0 SH (SUTURE) ×1 IMPLANT
SUT VIC AB 2-0 SH 27 (SUTURE)
SUT VIC AB 2-0 SH 27XBRD (SUTURE) IMPLANT
SUT VIC AB 3-0 FS2 27 (SUTURE) IMPLANT
SUT VIC AB 4-0 P-3 18XBRD (SUTURE) IMPLANT
SUT VIC AB 4-0 P3 18 (SUTURE)
SUT VICRYL 3-0 CR8 SH (SUTURE) ×2 IMPLANT
SUT VICRYL 4-0 PS2 18IN ABS (SUTURE) IMPLANT
SYR BULB 3OZ (MISCELLANEOUS) IMPLANT
SYRINGE 10CC LL (SYRINGE) ×2 IMPLANT
TAPE HYPAFIX 4 X10 (GAUZE/BANDAGES/DRESSINGS) IMPLANT
TOWEL OR NON WOVEN STRL DISP B (DISPOSABLE) ×2 IMPLANT
TUBE CONNECTING 20X1/4 (TUBING) ×1 IMPLANT
YANKAUER SUCT BULB TIP NO VENT (SUCTIONS) ×1 IMPLANT

## 2014-10-12 NOTE — Anesthesia Procedure Notes (Signed)
Procedure Name: Intubation Date/Time: 10/12/2014 1:29 PM Performed by: Lyndee Leo Pre-anesthesia Checklist: Patient identified, Emergency Drugs available, Suction available and Patient being monitored Patient Re-evaluated:Patient Re-evaluated prior to inductionOxygen Delivery Method: Circle System Utilized Preoxygenation: Pre-oxygenation with 100% oxygen Intubation Type: IV induction Ventilation: Mask ventilation without difficulty Laryngoscope Size: Mac and 3 Grade View: Grade II Tube type: Oral Tube size: 7.0 mm Number of attempts: 1 Airway Equipment and Method: stylet and oral airway Placement Confirmation: ETT inserted through vocal cords under direct vision,  positive ETCO2 and breath sounds checked- equal and bilateral Secured at: 21 cm Tube secured with: Tape Dental Injury: Teeth and Oropharynx as per pre-operative assessment

## 2014-10-12 NOTE — Op Note (Signed)
Patient Name:           Maria Simmons   Date of Surgery:        10/12/2014  Pre op Diagnosis:      Left cervical adenopathy, rule out lymphoma  Post op Diagnosis:    Same  Procedure:                 Excision deep left jugular lymph node  Surgeon:                     Edsel Petrin. Dalbert Batman, M.D., FACS  Assistant:                      OR staff  Operative Indications:   The patient is a 48 year old female who is  referred by Dr. Lona Kettle at the [facility] for evaluation of left neck adenopathy.  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 She underwent radioiodine ablation under Dr. Owens Loffler supervision and has been followed by her ever since. She states she got total body scans up until 2012. At that time she was noted to have leukopenia. She's been followed at the [facility] with for the leukopenia. Recent thyroglobulin levels are reportedly undetectable.pai She describes a fullness and swelling in the left neck which started prior to  A recent  trip to [place]. It is painless. Ultrasound shows lymph nodes on the left side, one below the mandible measures 1.9 cm, a more inferior node measures 2.1 cm, a third more inferior node measures 4.2 x 1.5 x 2.2, a small lymph node in the right neck measures 1.7 cm.  Nuclear medicine PET imaging shows a left upper internal jugular adenopathy the largest node measuring 1.9 cm. The right neck there was a tiny hypermetabolic level II jugular node. There was also prominent hypermetabolic activity throughout the lingular and palatine tonsils. There was no hypermetabolic mediastinal or hilar nodes but there was a mildly hypermetabolic sub-centimeter left subpectoral lymph node. No pulmonary nodules. Abdomen negative. Gastric LAP-BAND noted. No skeletal metastasis.  It is not clear whether this is recurrent thyroid cancer, lymphoma ,squamous cell cancer of the head and neck, or other  neoplasia. FNA performed by me shows atypical lymphoid cells, suspicious for low proliferative disorder. No evidence of carcinoma. She is brought to the operating room for excisional biopsy of a deep left jugular lymph node to rule out lymphoma.    Operative Findings:       In the deep left, proximal jugular chain medial and deep to the sternocleidomastoid muscle, there was a 3 cm x 1.5 cm smooth rubbery mass consistent with a pathologic lymph node. This was removed intact in its entirety and sent fresh to the lab.  Procedure in Detail:          Following the induction of general LMA anesthesia the patient was positioned with a roll behind her shoulders and her left arm tucked at her side. The neck was extended and turned to the right. The left neck was prepped and draped in a sterile fashion. Surgical timeout was performed. 0.5% Marcaine with epinephrine was used as local infiltration anesthetic.    With the neck properly positioned, I could palpate the anterior border of the sternocleidomastoid more muscle and I could palpate the abnormal lymph node anterior and deep to this in the superior neck. This was well below the submandibular  gland, however.   transverse skin crease incision was made. Dissection was carried down through the platysma muscle. Self-retaining retractors were placed. Sternocleidomastoid muscle was dissected off of the mass and retracted laterally. I slowly dissected the lymph node out of its bed with gentle, spreading dissection and electrocautery. The node was slowly delivered into the wound then slowly dissected from  its posterior attachments without any significant bleeding. The lymph node was sent fresh to the lab intact. Appropriate clinical history was attached. Hemostasis was excellent and had been achieved with electrocautery. No major vessels were injured. Small piece of SNOW hemostatic sponge was  placed in the wound. Platysma muscle was closed with 3-0 Vicryl sutures and skin  closed with a running 4-0 Monocryl and Dermabond. Patient tolerated procedure well was taken to PACU in stable condition. EBL less than 10 mL. Counts correct. Complications none.            Edsel Petrin. Dalbert Batman, M.D., FACS General and Minimally Invasive Surgery Breast and Colorectal Surgery  10/12/2014 2:12 PM

## 2014-10-12 NOTE — Discharge Instructions (Signed)
An enlarged lymph node was removed from your left neck today.  Place an ice pack over the wound intermittently for the next 24 hours while awake.  The Dermabond superglue over the incision should wear off in 3 weeks or less.  You may shower, starting tomorrow. No tub baths  You may resume driving your car in a few days, once you can move your neck around well  Dr. Darrel Hoover office should call the pathology report to you by the next Tuesday or Wednesday  No sports  Return to see Dr. Dalbert Batman in the office in approximately 3 weeks.   Post Anesthesia Home Care Instructions  Activity: Get plenty of rest for the remainder of the day. A responsible adult should stay with you for 24 hours following the procedure.  For the next 24 hours, DO NOT: -Drive a car -Paediatric nurse -Drink alcoholic beverages -Take any medication unless instructed by your physician -Make any legal decisions or sign important papers.  Meals: Start with liquid foods such as gelatin or soup. Progress to regular foods as tolerated. Avoid greasy, spicy, heavy foods. If nausea and/or vomiting occur, drink only clear liquids until the nausea and/or vomiting subsides. Call your physician if vomiting continues.  Special Instructions/Symptoms: Your throat may feel dry or sore from the anesthesia or the breathing tube placed in your throat during surgery. If this causes discomfort, gargle with warm salt water. The discomfort should disappear within 24 hours.

## 2014-10-12 NOTE — Anesthesia Preprocedure Evaluation (Signed)
Anesthesia Evaluation  Patient identified by MRN, date of birth, ID band Patient awake    Reviewed: Allergy & Precautions, H&P , NPO status , Patient's Chart, lab work & pertinent test results  History of Anesthesia Complications (+) PONV  Airway Mallampati: I  TM Distance: >3 FB Neck ROM: Full    Dental  (+) Teeth Intact, Dental Advisory Given   Pulmonary  breath sounds clear to auscultation        Cardiovascular Rhythm:Regular Rate:Normal     Neuro/Psych    GI/Hepatic   Endo/Other  diabetes, Well Controlled  Renal/GU      Musculoskeletal   Abdominal   Peds  Hematology   Anesthesia Other Findings   Reproductive/Obstetrics                             Anesthesia Physical Anesthesia Plan  ASA: II  Anesthesia Plan: General   Post-op Pain Management:    Induction: Intravenous  Airway Management Planned: LMA  Additional Equipment:   Intra-op Plan:   Post-operative Plan: Extubation in OR  Informed Consent: I have reviewed the patients History and Physical, chart, labs and discussed the procedure including the risks, benefits and alternatives for the proposed anesthesia with the patient or authorized representative who has indicated his/her understanding and acceptance.   Dental advisory given  Plan Discussed with: CRNA, Anesthesiologist and Surgeon  Anesthesia Plan Comments:         Anesthesia Quick Evaluation

## 2014-10-12 NOTE — Transfer of Care (Signed)
Immediate Anesthesia Transfer of Care Note  Patient: Maria Simmons  Procedure(s) Performed: Procedure(s): excisional biopsy deep left neck lymph node (Left)  Patient Location: PACU  Anesthesia Type:General  Level of Consciousness: sedated and patient cooperative  Airway & Oxygen Therapy: Patient Spontanous Breathing and Patient connected to face mask oxygen  Post-op Assessment: Report given to PACU RN and Post -op Vital signs reviewed and stable  Post vital signs: Reviewed and stable  Complications: No apparent anesthesia complications

## 2014-10-12 NOTE — Anesthesia Postprocedure Evaluation (Signed)
  Anesthesia Post-op Note  Patient: Maria Simmons  Procedure(s) Performed: Procedure(s): excisional biopsy deep left neck lymph node (Left)  Patient Location: PACU  Anesthesia Type: General   Level of Consciousness: awake, alert  and oriented  Airway and Oxygen Therapy: Patient Spontanous Breathing  Post-op Pain: mild  Post-op Assessment: Post-op Vital signs reviewed  Post-op Vital Signs: Reviewed  Last Vitals:  Filed Vitals:   10/12/14 1552  BP: 145/74  Pulse: 75  Temp: 36.6 C  Resp: 16    Complications: No apparent anesthesia complications

## 2014-10-12 NOTE — Interval H&P Note (Signed)
History and Physical Interval Note:  10/12/2014 1:00 PM  Maria Simmons  has presented today for surgery, with the diagnosis of ADENOPATHY LEFT NECK  The various methods of treatment have been discussed with the patient and family. After consideration of risks, benefits and other options for treatment, the patient has consented to  Procedure(s): excisional biopsy deep left neck lymph node (Left) as a surgical intervention .  The patient's history has been reviewed, patient examined today, no change in status, stable for surgery.  I have reviewed the patient's chart and labs.  Questions were answered to the patient's satisfaction.     Adin Hector

## 2014-10-16 ENCOUNTER — Telehealth (INDEPENDENT_AMBULATORY_CARE_PROVIDER_SITE_OTHER): Payer: Self-pay | Admitting: General Surgery

## 2014-10-16 NOTE — Telephone Encounter (Signed)
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 I called the patient and discussed this with her. I told her to make an appointment with Dr. Arlys John as soon as possible to discuss treatment options She will return to see me in 2 weeks for a wound check.  she expressed understanding and appreciation.    Edsel Petrin. Dalbert Batman, M.D., [facility] General and Minimally invasive Surgery Breast and Colorectal Surgery Office:   [phone] Pager:   [phone]

## 2014-10-17 ENCOUNTER — Other Ambulatory Visit: Payer: Self-pay | Admitting: Hematology

## 2014-10-17 ENCOUNTER — Telehealth: Payer: Self-pay | Admitting: Hematology

## 2014-10-17 NOTE — Telephone Encounter (Signed)
Pt called lft msg needing to get in soon due to test results that she recd from her scan, sent msg to Dr. Renella Cunas desk nurse to call pt concerning this matter and that we would need to get a POF to schedule pt to come in... KJ

## 2014-10-17 NOTE — Telephone Encounter (Signed)
Pt lft msg about receiving information concerning her results, forward call to Dr. Renella Cunas desk nurse. Per 11/04 POF labs/ov added lft msg for pt confirming D/T and if she could not do this D/T to please call the office to r/s..... KJ

## 2014-10-18 ENCOUNTER — Other Ambulatory Visit: Payer: Self-pay | Admitting: *Deleted

## 2014-10-18 DIAGNOSIS — D72819 Decreased white blood cell count, unspecified: Secondary | ICD-10-CM

## 2014-10-18 DIAGNOSIS — Z85850 Personal history of malignant neoplasm of thyroid: Secondary | ICD-10-CM

## 2014-10-19 ENCOUNTER — Other Ambulatory Visit: Payer: Self-pay | Admitting: *Deleted

## 2014-10-19 ENCOUNTER — Other Ambulatory Visit (HOSPITAL_BASED_OUTPATIENT_CLINIC_OR_DEPARTMENT_OTHER): Payer: 59

## 2014-10-19 ENCOUNTER — Encounter: Payer: Self-pay | Admitting: Hematology

## 2014-10-19 ENCOUNTER — Ambulatory Visit (HOSPITAL_BASED_OUTPATIENT_CLINIC_OR_DEPARTMENT_OTHER): Payer: 59 | Admitting: Hematology

## 2014-10-19 ENCOUNTER — Telehealth: Payer: Self-pay | Admitting: Hematology

## 2014-10-19 VITALS — BP 151/80 | HR 101 | Temp 99.6°F | Resp 17 | Wt 173.5 lb

## 2014-10-19 DIAGNOSIS — C801 Malignant (primary) neoplasm, unspecified: Secondary | ICD-10-CM

## 2014-10-19 DIAGNOSIS — C8331 Diffuse large B-cell lymphoma, lymph nodes of head, face, and neck: Secondary | ICD-10-CM

## 2014-10-19 DIAGNOSIS — Z8579 Personal history of other malignant neoplasms of lymphoid, hematopoietic and related tissues: Secondary | ICD-10-CM | POA: Insufficient documentation

## 2014-10-19 DIAGNOSIS — Z85850 Personal history of malignant neoplasm of thyroid: Secondary | ICD-10-CM

## 2014-10-19 DIAGNOSIS — D72819 Decreased white blood cell count, unspecified: Secondary | ICD-10-CM

## 2014-10-19 DIAGNOSIS — C8330 Diffuse large B-cell lymphoma, unspecified site: Secondary | ICD-10-CM

## 2014-10-19 LAB — CBC WITH DIFFERENTIAL/PLATELET
BASO%: 0.4 % (ref 0.0–2.0)
BASOS ABS: 0 10*3/uL (ref 0.0–0.1)
EOS ABS: 0 10*3/uL (ref 0.0–0.5)
EOS%: 1.1 % (ref 0.0–7.0)
HEMATOCRIT: 36.8 % (ref 34.8–46.6)
HEMOGLOBIN: 11.6 g/dL (ref 11.6–15.9)
LYMPH%: 11.5 % — AB (ref 14.0–49.7)
MCH: 26.4 pg (ref 25.1–34.0)
MCHC: 31.5 g/dL (ref 31.5–36.0)
MCV: 83.9 fL (ref 79.5–101.0)
MONO#: 0.4 10*3/uL (ref 0.1–0.9)
MONO%: 8.4 % (ref 0.0–14.0)
NEUT%: 78.6 % — AB (ref 38.4–76.8)
NEUTROS ABS: 3.3 10*3/uL (ref 1.5–6.5)
PLATELETS: 286 10*3/uL (ref 145–400)
RBC: 4.38 10*6/uL (ref 3.70–5.45)
RDW: 13.4 % (ref 11.2–14.5)
WBC: 4.3 10*3/uL (ref 3.9–10.3)
lymph#: 0.5 10*3/uL — ABNORMAL LOW (ref 0.9–3.3)

## 2014-10-19 LAB — COMPREHENSIVE METABOLIC PANEL (CC13)
ALK PHOS: 103 U/L (ref 40–150)
ALT: 13 U/L (ref 0–55)
AST: 13 U/L (ref 5–34)
Albumin: 3.4 g/dL — ABNORMAL LOW (ref 3.5–5.0)
Anion Gap: 7 mEq/L (ref 3–11)
BILIRUBIN TOTAL: 0.29 mg/dL (ref 0.20–1.20)
BUN: 10.8 mg/dL (ref 7.0–26.0)
CO2: 30 mEq/L — ABNORMAL HIGH (ref 22–29)
CREATININE: 0.9 mg/dL (ref 0.6–1.1)
Calcium: 9.2 mg/dL (ref 8.4–10.4)
Chloride: 102 mEq/L (ref 98–109)
GLUCOSE: 104 mg/dL (ref 70–140)
Potassium: 4.4 mEq/L (ref 3.5–5.1)
Sodium: 138 mEq/L (ref 136–145)
Total Protein: 6.9 g/dL (ref 6.4–8.3)

## 2014-10-19 MED ORDER — LORAZEPAM 0.5 MG PO TABS: 0.5000 mg | ORAL_TABLET | Freq: Four times a day (QID) | ORAL | Status: DC | PRN

## 2014-10-19 MED ORDER — ONDANSETRON HCL 8 MG PO TABS
8.0000 mg | ORAL_TABLET | Freq: Two times a day (BID) | ORAL | Status: DC
Start: 2014-10-19 — End: 2015-01-07

## 2014-10-19 MED ORDER — PROCHLORPERAZINE MALEATE 10 MG PO TABS: 10.0000 mg | ORAL_TABLET | Freq: Four times a day (QID) | ORAL | Status: DC | PRN

## 2014-10-19 MED ORDER — ALLOPURINOL 300 MG PO TABS
300.0000 mg | ORAL_TABLET | Freq: Every day | ORAL | Status: DC
Start: 2014-10-19 — End: 2015-01-07

## 2014-10-19 MED ORDER — PREDNISONE 20 MG PO TABS: 100.0000 mg | ORAL_TABLET | Freq: Every day | ORAL | Status: DC
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
## 2014-10-19 NOTE — Telephone Encounter (Signed)
gv adn printed appt sched adn avs for pt for NOV...S.w. Debbie at [facility] they will call pt with appt....emailed LD for echo auth

## 2014-10-19 NOTE — Patient Instructions (Signed)
INFO ON R-CHOP given

## 2014-10-19 NOTE — Addendum Note (Signed)
Addended by: Hayes Ludwig on: 10/19/2014 06:44 PM   Modules accepted: Orders

## 2014-10-21 ENCOUNTER — Other Ambulatory Visit (INDEPENDENT_AMBULATORY_CARE_PROVIDER_SITE_OTHER): Payer: Self-pay | Admitting: General Surgery

## 2014-10-22 ENCOUNTER — Ambulatory Visit (HOSPITAL_BASED_OUTPATIENT_CLINIC_OR_DEPARTMENT_OTHER): Payer: 59 | Admitting: Nurse Practitioner

## 2014-10-22 ENCOUNTER — Encounter: Payer: Self-pay | Admitting: Nurse Practitioner

## 2014-10-22 ENCOUNTER — Telehealth: Payer: Self-pay | Admitting: Nurse Practitioner

## 2014-10-22 ENCOUNTER — Other Ambulatory Visit: Payer: Self-pay

## 2014-10-22 ENCOUNTER — Ambulatory Visit (HOSPITAL_BASED_OUTPATIENT_CLINIC_OR_DEPARTMENT_OTHER): Payer: 59

## 2014-10-22 ENCOUNTER — Telehealth: Payer: Self-pay | Admitting: *Deleted

## 2014-10-22 ENCOUNTER — Telehealth: Payer: Self-pay

## 2014-10-22 ENCOUNTER — Encounter (HOSPITAL_BASED_OUTPATIENT_CLINIC_OR_DEPARTMENT_OTHER): Payer: Self-pay | Admitting: *Deleted

## 2014-10-22 VITALS — BP 138/77 | HR 90 | Temp 98.0°F | Resp 20 | Wt 171.2 lb

## 2014-10-22 DIAGNOSIS — C8330 Diffuse large B-cell lymphoma, unspecified site: Secondary | ICD-10-CM

## 2014-10-22 DIAGNOSIS — K921 Melena: Secondary | ICD-10-CM

## 2014-10-22 LAB — COMPREHENSIVE METABOLIC PANEL (CC13)
ALT: 14 U/L (ref 0–55)
ANION GAP: 8 meq/L (ref 3–11)
AST: 15 U/L (ref 5–34)
Albumin: 3.5 g/dL (ref 3.5–5.0)
Alkaline Phosphatase: 91 U/L (ref 40–150)
BUN: 9.6 mg/dL (ref 7.0–26.0)
CALCIUM: 9.3 mg/dL (ref 8.4–10.4)
CHLORIDE: 103 meq/L (ref 98–109)
CO2: 29 meq/L (ref 22–29)
Creatinine: 0.8 mg/dL (ref 0.6–1.1)
Glucose: 92 mg/dl (ref 70–140)
Potassium: 3.6 mEq/L (ref 3.5–5.1)
SODIUM: 140 meq/L (ref 136–145)
TOTAL PROTEIN: 7.2 g/dL (ref 6.4–8.3)
Total Bilirubin: 0.23 mg/dL (ref 0.20–1.20)

## 2014-10-22 LAB — CBC WITH DIFFERENTIAL/PLATELET
BASO%: 0 % (ref 0.0–2.0)
Basophils Absolute: 0 10*3/uL (ref 0.0–0.1)
EOS%: 1.8 % (ref 0.0–7.0)
Eosinophils Absolute: 0.1 10*3/uL (ref 0.0–0.5)
HCT: 36.2 % (ref 34.8–46.6)
HGB: 11.9 g/dL (ref 11.6–15.9)
LYMPH#: 1.2 10*3/uL (ref 0.9–3.3)
LYMPH%: 36.2 % (ref 14.0–49.7)
MCH: 27.4 pg (ref 25.1–34.0)
MCHC: 32.9 g/dL (ref 31.5–36.0)
MCV: 83.2 fL (ref 79.5–101.0)
MONO#: 0.4 10*3/uL (ref 0.1–0.9)
MONO%: 12 % (ref 0.0–14.0)
NEUT%: 50 % (ref 38.4–76.8)
NEUTROS ABS: 1.6 10*3/uL (ref 1.5–6.5)
PLATELETS: 330 10*3/uL (ref 145–400)
RBC: 4.35 10*6/uL (ref 3.70–5.45)
RDW: 12.8 % (ref 11.2–14.5)
WBC: 3.3 10*3/uL — AB (ref 3.9–10.3)

## 2014-10-22 LAB — LACTATE DEHYDROGENASE (CC13): LDH: 183 U/L (ref 125–245)

## 2014-10-22 LAB — URIC ACID (CC13): URIC ACID, SERUM: 6.3 mg/dL (ref 2.6–7.4)

## 2014-10-22 NOTE — Telephone Encounter (Signed)
Pt appt. With Dr. Reita Chard is 11/13/14@12 :5. Faxed pt medical records.  Left pt vm in ref to referral.

## 2014-10-22 NOTE — Telephone Encounter (Signed)
, °

## 2014-10-22 NOTE — Telephone Encounter (Signed)
ALSO PT. VOMITED X1 Saturday NIGHT. SHE HAS HAD MILD ABDOMINAL PAIN AND LOWER BACK PAIN. VERBAL ORDER AND READ BACK TO DR.SEHBAI- PT. TO COME FOR LAB AND SEE CINDEE BACON. NOTIFIED PT. SHE WILL BE HERE AT 1:00PM.

## 2014-10-23 ENCOUNTER — Encounter: Payer: Self-pay | Admitting: *Deleted

## 2014-10-23 ENCOUNTER — Other Ambulatory Visit: Payer: Self-pay | Admitting: Radiology

## 2014-10-23 ENCOUNTER — Encounter (HOSPITAL_BASED_OUTPATIENT_CLINIC_OR_DEPARTMENT_OTHER): Payer: Self-pay | Admitting: *Deleted

## 2014-10-23 ENCOUNTER — Other Ambulatory Visit: Payer: 59

## 2014-10-23 ENCOUNTER — Other Ambulatory Visit: Payer: Self-pay | Admitting: *Deleted

## 2014-10-23 ENCOUNTER — Encounter: Payer: Self-pay | Admitting: Nurse Practitioner

## 2014-10-23 DIAGNOSIS — D72819 Decreased white blood cell count, unspecified: Secondary | ICD-10-CM

## 2014-10-23 DIAGNOSIS — K921 Melena: Secondary | ICD-10-CM | POA: Insufficient documentation

## 2014-10-23 DIAGNOSIS — C801 Malignant (primary) neoplasm, unspecified: Secondary | ICD-10-CM

## 2014-10-23 DIAGNOSIS — C8330 Diffuse large B-cell lymphoma, unspecified site: Secondary | ICD-10-CM

## 2014-10-23 DIAGNOSIS — Z85850 Personal history of malignant neoplasm of thyroid: Secondary | ICD-10-CM

## 2014-10-23 MED ORDER — LIDOCAINE-PRILOCAINE 2.5-2.5 % EX CREA: TOPICAL_CREAM | CUTANEOUS | Status: DC

## 2014-10-23 NOTE — Progress Notes (Signed)
SYMPTOM MANAGEMENT CLINIC   HPI: Maria Simmons 48 y.o. female diagnosed with diffuse large B-cell lymphoma.  Plans to initiate R.-CHOP chemotherapy regimen on Thursday, 10/25/2014.  Patient called the cancer Center today requesting urgent care visit.  She reports that she developed some bright red blood clots in her stool Friday evening 10/19/2014.  She is also complaining of some very vague left lower quadrant discomfort.  She denies any specific diarrhea; stating that her bowel movements are softly formed.  She denies any nausea or vomiting.  Patient is also complaining of a newly found mass to her left buttock region.  She states that this area is somewhat tender on occasion only.  She denies any recent fevers or chills.   HPI  CURRENT THERAPY: Upcoming Treatment Dates - NON-HODGKINS LYMPHOMA R-CHOP q21d Days with orders from any treatment category:  10/25/2014      SCHEDULING COMMUNICATION      acetaminophen (TYLENOL) tablet 650 mg      diphenhydrAMINE (BENADRYL) capsule 50 mg      ondansetron (ZOFRAN) IVPB 16 mg      Dexamethasone Sodium Phosphate (DECADRON) injection 20 mg      DOXOrubicin (ADRIAMYCIN) chemo injection 94 mg      vinCRIStine (ONCOVIN) 2 mg in sodium chloride 0.9 % 50 mL chemo infusion      cyclophosphamide (CYTOXAN) 1,400 mg in sodium chloride 0.9 % 250 mL chemo infusion      riTUXimab (RITUXAN) 700 mg in sodium chloride 0.9 % 250 mL (2.1875 mg/mL) chemo infusion      sodium chloride 0.9 % injection 10 mL      heparin lock flush 100 unit/mL      heparin lock flush 100 unit/mL      alteplase (CATHFLO ACTIVASE) injection 2 mg      sodium chloride 0.9 % injection 3 mL      Cold Pack 1 packet      Hot Pack 1 packet      diphenhydrAMINE (BENADRYL) injection 25 mg      famotidine (PEPCID) IVPB 20 mg      0.9 %  sodium chloride infusion      methylPREDNISolone sodium succinate (SOLU-MEDROL) 125 mg/2 mL injection 125 mg      EPINEPHrine (ADRENALIN) 0.1  MG/ML injection 0.25 mg      EPINEPHrine (ADRENALIN) 0.1 MG/ML injection 0.25 mg      EPINEPHrine (ADRENALIN) injection 0.5 mg      EPINEPHrine (ADRENALIN) injection 0.5 mg      diphenhydrAMINE (BENADRYL) injection 50 mg      albuterol (PROVENTIL) (2.5 MG/3ML) 0.083% nebulizer solution 2.5 mg      0.9 %  sodium chloride infusion      TREATMENT CONDITIONS 10/26/2014      SCHEDULING COMMUNICATION INJECTION      pegfilgrastim (NEULASTA) injection 6 mg 11/15/2014      SCHEDULING COMMUNICATION      acetaminophen (TYLENOL) tablet 650 mg      diphenhydrAMINE (BENADRYL) capsule 50 mg      ondansetron (ZOFRAN) IVPB 16 mg      Dexamethasone Sodium Phosphate (DECADRON) injection 20 mg      DOXOrubicin (ADRIAMYCIN) chemo injection 94 mg      vinCRIStine (ONCOVIN) 2 mg in sodium chloride 0.9 % 50 mL chemo infusion      cyclophosphamide (CYTOXAN) 1,400 mg in sodium chloride 0.9 % 250 mL chemo infusion      riTUXimab (RITUXAN) 700 mg in sodium chloride  0.9 % 250 mL (2.1875 mg/mL) chemo infusion      sodium chloride 0.9 % injection 10 mL      heparin lock flush 100 unit/mL      heparin lock flush 100 unit/mL      alteplase (CATHFLO ACTIVASE) injection 2 mg      sodium chloride 0.9 % injection 3 mL      Cold Pack 1 packet      Hot Pack 1 packet      diphenhydrAMINE (BENADRYL) injection 25 mg      famotidine (PEPCID) IVPB 20 mg      0.9 %  sodium chloride infusion      methylPREDNISolone sodium succinate (SOLU-MEDROL) 125 mg/2 mL injection 125 mg      EPINEPHrine (ADRENALIN) 0.1 MG/ML injection 0.25 mg      EPINEPHrine (ADRENALIN) 0.1 MG/ML injection 0.25 mg      EPINEPHrine (ADRENALIN) injection 0.5 mg      EPINEPHrine (ADRENALIN) injection 0.5 mg      diphenhydrAMINE (BENADRYL) injection 50 mg      albuterol (PROVENTIL) (2.5 MG/3ML) 0.083% nebulizer solution 2.5 mg      0.9 %  sodium chloride infusion      TREATMENT CONDITIONS    ROS  Past Medical History  Diagnosis Date  . Cancer   .  Diabetes mellitus   . Hypercholesterolemia   . Thyroid cancer   . Hypothyroidism   . Wears glasses   . PONV (postoperative nausea and vomiting)   . DLBCL (diffuse large B cell lymphoma)     Past Surgical History  Procedure Laterality Date  . 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  . Tubal ligation  1990  . Abdominal hysterectomy  2000  . Colonoscopy    . Lymph node biopsy Left 10/12/2014    Procedure: excisional biopsy deep left neck lymph node;  Surgeon: Fanny Skates, MD;  Location: [facility];  Service: General;  Laterality: Left;  . Laparoscopic gastric banding with hiatal hernia repair  05/28/2009    has Leucopenia; Hx of papillary thyroid carcinoma; Cancer; DLBCL (diffuse large B cell lymphoma); and Hematochezia on her problem list.     is allergic to metformin and related; tape; and penicillins.    Medication List       This list is accurate as of: 10/22/14 11:59 PM.  Always use your most recent med list.               allopurinol 300 MG tablet  Commonly known as:  ZYLOPRIM  Take 1 tablet (300 mg total) by mouth daily.     aspirin EC 81 MG tablet  Take 81 mg by mouth every morning.     Biotin 10 MG Tabs  Take 1 tablet by mouth every morning.     HYDROcodone-acetaminophen 5-325 MG per tablet  Commonly known as:  NORCO  Take 1-2 tablets by mouth every 6 (six) hours as needed for moderate pain or severe pain.     levothyroxine 150 MCG tablet  Commonly known as:  SYNTHROID, LEVOTHROID  Take 150 mcg by mouth daily before breakfast.     LORazepam 0.5 MG tablet  Commonly known as:  ATIVAN  Take 1 tablet (0.5 mg total) by mouth every 6 (six) hours as needed (Nausea or vomiting).     multivitamin with minerals Tabs tablet  Take 1 tablet by mouth every morning.     ondansetron  8 MG tablet  Commonly known as:  ZOFRAN  Take 1 tablet (8 mg total) by mouth 2 (two) times daily. Start the day  after chemo for 3 days. Then as needed for nausea or vomiting.     predniSONE 20 MG tablet  Commonly known as:  DELTASONE  Take 5 tablets (100 mg total) by mouth daily. Take on days 1-5 of chemotherapy.     prochlorperazine 10 MG tablet  Commonly known as:  COMPAZINE  Take 1 tablet (10 mg total) by mouth every 6 (six) hours as needed (Nausea or vomiting).     VITAMIN B 12 PO  Take 1 tablet by mouth every morning.     Vitamin D (Cholecalciferol) 1000 UNITS Tabs  Take 2,000 Int'l Units by mouth daily at 12 noon.         PHYSICAL EXAMINATION  Blood pressure 138/77, pulse 90, temperature 98 F (36.7 C), resp. rate 20, weight 171 lb 4 oz (77.678 kg), SpO2 100 %.  Physical Exam  Constitutional: She is oriented to person, place, and time and well-developed, well-nourished, and in no distress.  HENT:  Head: Normocephalic and atraumatic.  Mouth/Throat: Oropharynx is clear and moist.  Eyes: Conjunctivae and EOM are normal. Pupils are equal, round, and reactive to light. No scleral icterus.  Neck: Normal range of motion. Neck supple. No JVD present. No tracheal deviation present. No thyromegaly present.  Cardiovascular: Normal rate, regular rhythm, normal heart sounds and intact distal pulses.   Pulmonary/Chest: Effort normal and breath sounds normal. No respiratory distress. She has no wheezes. She has no rales.  Abdominal: Soft. Bowel sounds are normal. She exhibits no distension and no mass. There is no tenderness. There is no rebound and no guarding.  Genitourinary:  Rectal exam revealed no internal or external hemorrhoids.  No stool noted in rectal vault.  No evidence of active bleeding.  Musculoskeletal: Normal range of motion. She exhibits no edema or tenderness.  Lymphadenopathy:    She has no cervical adenopathy.  Neurological: She is alert and oriented to person, place, and time. Gait normal.  Skin: Skin is warm and dry. No rash noted. No erythema.  Patient is noted to have a  palpable, movable mass to her left buttock region.  Area is mildly tender with palpation.  There is no evidence of erythema, edema, warmth, or red streaks.  Patient is noted have full range of motion with all extremities.  Psychiatric: Affect normal.  Nursing note and vitals reviewed.   LABORATORY DATA:. Appointment on 10/22/2014  Component Date Value Ref Range Status  . WBC 10/22/2014 3.3* 3.9 - 10.3 10e3/uL Final  . NEUT# 10/22/2014 1.6  1.5 - 6.5 10e3/uL Final  . HGB 10/22/2014 11.9  11.6 - 15.9 g/dL Final  . HCT 10/22/2014 36.2  34.8 - 46.6 % Final  . Platelets 10/22/2014 330  145 - 400 10e3/uL Final  . MCV 10/22/2014 83.2  79.5 - 101.0 fL Final  . MCH 10/22/2014 27.4  25.1 - 34.0 pg Final  . MCHC 10/22/2014 32.9  31.5 - 36.0 g/dL Final  . RBC 10/22/2014 4.35  3.70 - 5.45 10e6/uL Final  . RDW 10/22/2014 12.8  11.2 - 14.5 % Final  . lymph# 10/22/2014 1.2  0.9 - 3.3 10e3/uL Final  . MONO# 10/22/2014 0.4  0.1 - 0.9 10e3/uL Final  . Eosinophils Absolute 10/22/2014 0.1  0.0 - 0.5 10e3/uL Final  . Basophils Absolute 10/22/2014 0.0  0.0 - 0.1 10e3/uL Final  . NEUT%  10/22/2014 50.0  38.4 - 76.8 % Final  . LYMPH% 10/22/2014 36.2  14.0 - 49.7 % Final  . MONO% 10/22/2014 12.0  0.0 - 14.0 % Final  . EOS% 10/22/2014 1.8  0.0 - 7.0 % Final  . BASO% 10/22/2014 0.0  0.0 - 2.0 % Final  . Sodium 10/22/2014 140  136 - 145 mEq/L Final  . Potassium 10/22/2014 3.6  3.5 - 5.1 mEq/L Final  . Chloride 10/22/2014 103  98 - 109 mEq/L Final  . CO2 10/22/2014 29  22 - 29 mEq/L Final  . Glucose 10/22/2014 92  70 - 140 mg/dl Final  . BUN 10/22/2014 9.6  7.0 - 26.0 mg/dL Final  . Creatinine 10/22/2014 0.8  0.6 - 1.1 mg/dL Final  . Total Bilirubin 10/22/2014 0.23  0.20 - 1.20 mg/dL Final  . Alkaline Phosphatase 10/22/2014 91  40 - 150 U/L Final  . AST 10/22/2014 15  5 - 34 U/L Final  . ALT 10/22/2014 14  0 - 55 U/L Final  . Total Protein 10/22/2014 7.2  6.4 - 8.3 g/dL Final  . Albumin 10/22/2014 3.5  3.5 -  5.0 g/dL Final  . Calcium 10/22/2014 9.3  8.4 - 10.4 mg/dL Final  . Anion Gap 10/22/2014 8  3 - 11 mEq/L Final  . HIV 1&2 Ab, 4th Generation 10/22/2014 NONREACTIVE  NONREACTIVE Preliminary   Comment:  A NONREACTIVE HIV Ag/Ab result does not exclude HIV infection sincethe time frame for seroconversion is variable. If acute HIV infectionis suspected, a HIV-1 RNA Qualitative TMA test is recommended. HIV-1/2 Antibody Diff         Not indicated.HIV-1 RNA,  Qual TMA           Not indicated. PLEASE NOTE: This information has been disclosed to you from [place] confidentiality may be protected by state law. If your staterequires such protection, then the state law prohibits you from [facility] further  disclosure of the information without the specific writtenconsent of the person to whom it pertains, or as otherwise permittedby law. A general authorization for the release of medical or otherinformation is NOT sufficient for this purpose. The  performance of this assay has not been clinically validated inpatients less than 47 years old.   . Hepatitis B Surface Ag 10/22/2014 NEGATIVE  NEGATIVE Preliminary  . HCV Ab 10/22/2014 NEGATIVE  NEGATIVE Preliminary   Comment:  Effective October 29, 2014, Hepatitis C Antibody (test code 23620)will be revised to automatically reflex to the Hepatitis C Viral RNA,Quantitative, Real-Time PCR assay if the antibody screening result isReactive. This action is being taken to ensure  that the CDC/USPSTFrecommended HCV diagnostic algorithm with the appropriate test reflexneeded for accurate interpretation is followed. As of October 29, 2014 the name of the test code 23620 will beHepatitis C Antibody with Reflex to HCV RNA,  Quantitative, Real-TimePCR. This change will also be reflected in standard and customprofiles that currently include test code 23620.    [name] LDH 10/22/2014 183  125 - 245 U/L Final  . Uric Acid, Serum 10/22/2014 6.3  2.6 - 7.4 mg/dl Final      RADIOGRAPHIC STUDIES: No results found.  ASSESSMENT/PLAN:    Lymphoma:  Patient has plans to initiate R.-CHOP chemotherapy regimen on Thursday, 10/25/2014.  Patient states that he has already been prescribed allopurinol; as well as her anti-emetics-but she has yet to start taking the allopurinol.  Patient has quite a few different appointments scheduled prior to initiating her chemotherapy this coming Thursday.  She is going  to chemotherapy education tomorrow 10/23/2014. She is scheduled for bone marrow biopsy on Wednesday, 10/24/2014. She also plans to obtain an echo and have a port placed as soon as possible as well.  She is awaiting a phone call back regarding the scheduling of these 2 appointments.  Advised patient that we would assist her in confirming that all of her appointments are scheduled appropriately.  Patient was advised that she could receive her first cycle of chemotherapy peripherally prior to having her port placed if needed.  Patient has also noted a new mass to her left buttock area within the past few weeks.  She states that this mass is movable; and essentially nontender with palpation.  Careful review of patient's staging scans obtained in October 2015 revealed no evidence of any metastatic skeletal lesions at that time. Will continue to monitor.   Hematochezia: Patient developed some very vague left lower quadrant discomfort and some blood in her stool this past Friday, 10/19/2014 evening.  She denies any specific diarrhea issues; but states her stool is only softly formed.  She denies any nausea or vomiting.  She states that the blood appears to be bright red and in clots. Abdomen nontender with deep palpation.  Rectal exam revealed no internal or external hemorrhoids.  Hemoglobin remains stable.  Advised patient to monitor her stools carefully; and to call if her symptoms persist or worsen whatsoever.     Patient stated understanding of all instructions; and was in  agreement with this plan of care. The patient knows to call the clinic with any problems, questions or concerns.   Review/collaboration with Dr. Lona Kettle regarding all aspects of patient's visit today.   Total time spent with patient was 40 minutes;  with greater than 80 percent of that time spent in face to face counseling regarding her symptoms, assistance with arrangement of all of patient's scheduled appointments, and coordination of care and follow up.  Disclaimer: This note was dictated with voice recognition software. Similar sounding words can inadvertently be transcribed and may not be corrected upon review.   Drue Second, NP 10/23/2014

## 2014-10-23 NOTE — H&P (Signed)
  Maria Simmons  Location: [facility] Patient #: [id number] DOB: June 25, 1966 Divorced / Language: English / Race: Black or African American Female     History of Present Illness  The patient is a 48 year old female who presents with a complaint of b cell lymphoma neck. Patient returns for postop visit She underwent excision of deep left jugular lymph node on October 12, 2014. This confirmed a large B-cell lymphoma. She is scheduled to see her oncologist Dr. Lona Kettle,. She knows that she will need chemotherapy.I suspect that she will need a Port-A-Cath and I discussed this with her. She has no complaints about her wound. There is a little swelling   Allergies Tape 1"X5yd *MEDICAL DEVICES* Penicillamine *ASSORTED CLASSES*  Medication History Aspirin Childrens (81MG  Tablet Chewable, Oral daily) Active. Biotin (10MG  Tablet, Oral daily) Active. Vitamin B-12 (1000MCG Tablet, Oral daily) Active. Levothyroxine Sodium (175MCG Tablet, Oral daily) Active. Multiple Vitamins (Oral daily) Active.  Vitals  10/18/2014 8:21 AM Weight: 174 lb Height: 63in Body Surface Area: 1.87 m Body Mass Index: 30.82 kg/m Temp.: 97.68F(Oral)  Pulse: 80 (Regular)  Resp.: 18 (Unlabored)  BP: 122/68 (Sitting, Left Arm, Standard)    Physical Exam  General Note: Alert. Friendly. No distress.   Head and Neck Note: well-healed left neck transverse skin crease scar.  No infection, hematoma, seroma, or drainage. There is some edema but no free fluid in the wound. Thyroidectomy scar well healed. No pretracheal mass. Suprasternal notch normal. Supraclavicular areas normal.   Chest and Lung Exam Note: Clear to auscultation bilaterally. No chest wall tenderness   Cardiovascular Note: Regular rate and rhythm. No murmur. No ectopy.   Abdomen Note: Laparoscopic scars from LAP-BAND procedure.      Assessment & Plan DIFFUSE LARGE B-CELL LYMPHOMA, LYMPH NODES  OF HEAD, FACE, AND NECK (202.81  C83.31) Current Plans  Follow up in 3 weeks or as needed  As discussed, the lymph node from your left neck shows a large B-cell lymphoma The swelling in your left neck will probably go away. We did not find any fluid on needle aspiration today. Return to see Dr. Dalbert Batman in 3 weeks to make sure that it has resolved Give [place] a call if your oncologist wants [place] to put a Port-A-Cath in HISTORY OF THYROID CANCER (V10.87  Z85.850)   Maria Simmons. Dalbert Batman, M.D., [facility] General and Minimally invasive Surgery Breast and Colorectal Surgery Office:   [phone] Pager:   [phone]

## 2014-10-23 NOTE — Assessment & Plan Note (Signed)
Patient developed some very vague left lower quadrant discomfort and some blood in her stool this past Friday, 10/19/2014 evening.  She denies any specific diarrhea issues; but states her stool is only softly formed.  She denies any nausea or vomiting.  She states that the blood appears to be bright red and in clots. Abdomen nontender with deep palpation.  Rectal exam revealed no internal or external hemorrhoids.  Hemoglobin remains stable.  Advised patient to monitor her stools carefully; and to call if her symptoms persist or worsen whatsoever.

## 2014-10-23 NOTE — Assessment & Plan Note (Addendum)
Patient has plans to initiate R.-CHOP chemotherapy regimen on Thursday, 10/25/2014.  Patient states that he has already been prescribed allopurinol; as well as her anti-emetics-but she has yet to start taking the allopurinol.  Patient has quite a few different appointments scheduled prior to initiating her chemotherapy this coming Thursday.  She is going to chemotherapy education tomorrow 10/23/2014. She is scheduled for bone marrow biopsy on Wednesday, 10/24/2014. She also plans to obtain an echo and have a port placed as soon as possible as well.  She is awaiting a phone call back regarding the scheduling of these 2 appointments.  Advised patient that we would assist her in confirming that all of her appointments are scheduled appropriately.  Patient was advised that she could receive her first cycle of chemotherapy peripherally prior to having her port placed if needed.  Patient has also noted a new mass to her left buttock area within the past few weeks.  She states that this mass is movable; and essentially nontender with palpation.  Careful review of patient's staging scans obtained in October 2015 revealed no evidence of any metastatic skeletal lesions at that time. Will continue to monitor.

## 2014-10-23 NOTE — Progress Notes (Signed)
Pt having a bone marrow bx in am,then goes for echo, then come here for pac-she is aware of where she needs to go-daughter will be with her

## 2014-10-24 ENCOUNTER — Encounter (HOSPITAL_BASED_OUTPATIENT_CLINIC_OR_DEPARTMENT_OTHER): Payer: Self-pay | Admitting: *Deleted

## 2014-10-24 ENCOUNTER — Ambulatory Visit (HOSPITAL_BASED_OUTPATIENT_CLINIC_OR_DEPARTMENT_OTHER): Payer: 59 | Admitting: Anesthesiology

## 2014-10-24 ENCOUNTER — Ambulatory Visit (HOSPITAL_BASED_OUTPATIENT_CLINIC_OR_DEPARTMENT_OTHER)
Admission: RE | Admit: 2014-10-24 | Discharge: 2014-10-24 | Disposition: A | Discharge status: Home / Self Care | Payer: 59 | Source: Ambulatory Visit | Attending: General Surgery | Admitting: General Surgery

## 2014-10-24 ENCOUNTER — Ambulatory Visit (HOSPITAL_COMMUNITY)
Admission: RE | Admit: 2014-10-24 | Discharge: 2014-10-24 | Disposition: A | Discharge status: Home / Self Care | Payer: 59 | Source: Ambulatory Visit | Attending: Hematology | Admitting: Hematology

## 2014-10-24 ENCOUNTER — Encounter (HOSPITAL_BASED_OUTPATIENT_CLINIC_OR_DEPARTMENT_OTHER)
Admission: RE | Disposition: A | Discharge status: Home / Self Care | Payer: Self-pay | Source: Ambulatory Visit | Attending: General Surgery

## 2014-10-24 ENCOUNTER — Encounter (HOSPITAL_COMMUNITY): Payer: Self-pay

## 2014-10-24 ENCOUNTER — Ambulatory Visit (HOSPITAL_COMMUNITY): Payer: 59

## 2014-10-24 ENCOUNTER — Telehealth: Payer: Self-pay | Admitting: Hematology

## 2014-10-24 DIAGNOSIS — Z85850 Personal history of malignant neoplasm of thyroid: Secondary | ICD-10-CM | POA: Diagnosis not present

## 2014-10-24 DIAGNOSIS — Z95828 Presence of other vascular implants and grafts: Secondary | ICD-10-CM

## 2014-10-24 DIAGNOSIS — C8331 Diffuse large B-cell lymphoma, lymph nodes of head, face, and neck: Secondary | ICD-10-CM | POA: Insufficient documentation

## 2014-10-24 DIAGNOSIS — C73 Malignant neoplasm of thyroid gland: Secondary | ICD-10-CM | POA: Diagnosis not present

## 2014-10-24 DIAGNOSIS — E780 Pure hypercholesterolemia: Secondary | ICD-10-CM | POA: Diagnosis not present

## 2014-10-24 DIAGNOSIS — Z888 Allergy status to other drugs, medicaments and biological substances status: Secondary | ICD-10-CM | POA: Insufficient documentation

## 2014-10-24 DIAGNOSIS — E119 Type 2 diabetes mellitus without complications: Secondary | ICD-10-CM | POA: Diagnosis not present

## 2014-10-24 DIAGNOSIS — Z7982 Long term (current) use of aspirin: Secondary | ICD-10-CM | POA: Diagnosis not present

## 2014-10-24 DIAGNOSIS — Z79899 Other long term (current) drug therapy: Secondary | ICD-10-CM | POA: Insufficient documentation

## 2014-10-24 DIAGNOSIS — E039 Hypothyroidism, unspecified: Secondary | ICD-10-CM | POA: Insufficient documentation

## 2014-10-24 DIAGNOSIS — C8330 Diffuse large B-cell lymphoma, unspecified site: Secondary | ICD-10-CM

## 2014-10-24 DIAGNOSIS — Z880 Allergy status to penicillin: Secondary | ICD-10-CM | POA: Insufficient documentation

## 2014-10-24 DIAGNOSIS — Z8572 Personal history of non-Hodgkin lymphomas: Secondary | ICD-10-CM | POA: Diagnosis present

## 2014-10-24 DIAGNOSIS — D469 Myelodysplastic syndrome, unspecified: Secondary | ICD-10-CM | POA: Insufficient documentation

## 2014-10-24 DIAGNOSIS — Z8579 Personal history of other malignant neoplasms of lymphoid, hematopoietic and related tissues: Secondary | ICD-10-CM | POA: Diagnosis present

## 2014-10-24 HISTORY — PX: PORTACATH PLACEMENT: SHX2246

## 2014-10-24 LAB — HIV ANTIBODY (ROUTINE TESTING W REFLEX): HIV 1&2 Ab, 4th Generation: NONREACTIVE

## 2014-10-24 LAB — CBC WITH DIFFERENTIAL/PLATELET
BASOS PCT: 0 % (ref 0–1)
Basophils Absolute: 0 10*3/uL (ref 0.0–0.1)
EOS ABS: 0.1 10*3/uL (ref 0.0–0.7)
EOS PCT: 2 % (ref 0–5)
HCT: 37.2 % (ref 36.0–46.0)
Hemoglobin: 12.2 g/dL (ref 12.0–15.0)
Lymphocytes Relative: 36 % (ref 12–46)
Lymphs Abs: 1.2 10*3/uL (ref 0.7–4.0)
MCH: 27.1 pg (ref 26.0–34.0)
MCHC: 32.8 g/dL (ref 30.0–36.0)
MCV: 82.7 fL (ref 78.0–100.0)
MONOS PCT: 12 % (ref 3–12)
Monocytes Absolute: 0.4 10*3/uL (ref 0.1–1.0)
NEUTROS PCT: 50 % (ref 43–77)
Neutro Abs: 1.6 10*3/uL — ABNORMAL LOW (ref 1.7–7.7)
Platelets: 333 10*3/uL (ref 150–400)
RBC: 4.5 MIL/uL (ref 3.87–5.11)
RDW: 12.6 % (ref 11.5–15.5)
WBC: 3.2 10*3/uL — ABNORMAL LOW (ref 4.0–10.5)

## 2014-10-24 LAB — GLUCOSE, CAPILLARY: Glucose-Capillary: 107 mg/dL — ABNORMAL HIGH (ref 70–99)

## 2014-10-24 LAB — HEPATITIS B SURFACE ANTIGEN: HEP B S AG: NEGATIVE

## 2014-10-24 LAB — PROTIME-INR
INR: 1.01 (ref 0.00–1.49)
Prothrombin Time: 13.4 seconds (ref 11.6–15.2)

## 2014-10-24 LAB — APTT: aPTT: 29 seconds (ref 24–37)

## 2014-10-24 LAB — HEPATITIS C ANTIBODY: HCV Ab: NEGATIVE

## 2014-10-24 LAB — BETA 2 MICROGLOBULIN, SERUM: Beta-2 Microglobulin: 2.66 mg/L — ABNORMAL HIGH (ref <=2.51)

## 2014-10-24 LAB — POCT HEMOGLOBIN-HEMACUE: HEMOGLOBIN: 12 g/dL (ref 12.0–15.0)

## 2014-10-24 LAB — BONE MARROW EXAM

## 2014-10-24 SURGERY — INSERTION, TUNNELED CENTRAL VENOUS DEVICE, WITH PORT
Anesthesia: General | Site: Neck | Laterality: Right

## 2014-10-24 MED ORDER — HEPARIN SOD (PORK) LOCK FLUSH 100 UNIT/ML IV SOLN
INTRAVENOUS | Status: AC
  Filled 2014-10-24: qty 5

## 2014-10-24 MED ORDER — MIDAZOLAM HCL 2 MG/2ML IJ SOLN
INTRAMUSCULAR | Status: AC
  Filled 2014-10-24: qty 2

## 2014-10-24 MED ORDER — MIDAZOLAM HCL 2 MG/2ML IJ SOLN
INTRAMUSCULAR | Status: AC | PRN
  Administered 2014-10-24 (×2): 1 mg via INTRAVENOUS

## 2014-10-24 MED ORDER — FENTANYL CITRATE 0.05 MG/ML IJ SOLN
INTRAMUSCULAR | Status: AC | PRN
  Administered 2014-10-24 (×2): 50 ug via INTRAVENOUS

## 2014-10-24 MED ORDER — CHLORHEXIDINE GLUCONATE 4 % EX LIQD: 1.0000 "application " | Freq: Once | CUTANEOUS | Status: DC

## 2014-10-24 MED ORDER — PROMETHAZINE HCL 25 MG/ML IJ SOLN: 6.2500 mg | INTRAMUSCULAR | Status: DC | PRN

## 2014-10-24 MED ORDER — BUPIVACAINE-EPINEPHRINE (PF) 0.5% -1:200000 IJ SOLN
INTRAMUSCULAR | Status: DC | PRN
  Administered 2014-10-24: 10 mL via PERINEURAL

## 2014-10-24 MED ORDER — FENTANYL CITRATE 0.05 MG/ML IJ SOLN: 50.0000 ug | INTRAMUSCULAR | Status: DC | PRN

## 2014-10-24 MED ORDER — LACTATED RINGERS IV SOLN
INTRAVENOUS | Status: DC
  Administered 2014-10-24 (×2): via INTRAVENOUS

## 2014-10-24 MED ORDER — MIDAZOLAM HCL 2 MG/2ML IJ SOLN
INTRAMUSCULAR | Status: AC
Start: 2014-10-24 — End: 2014-10-24
  Filled 2014-10-24: qty 4

## 2014-10-24 MED ORDER — PROPOFOL 10 MG/ML IV BOLUS
INTRAVENOUS | Status: DC | PRN
  Administered 2014-10-24: 200 mg via INTRAVENOUS

## 2014-10-24 MED ORDER — FENTANYL CITRATE 0.05 MG/ML IJ SOLN
INTRAMUSCULAR | Status: AC
  Filled 2014-10-24: qty 4

## 2014-10-24 MED ORDER — FENTANYL CITRATE 0.05 MG/ML IJ SOLN
INTRAMUSCULAR | Status: DC | PRN
  Administered 2014-10-24 (×2): 50 ug via INTRAVENOUS

## 2014-10-24 MED ORDER — SCOPOLAMINE 1 MG/3DAYS TD PT72: 1.0000 | MEDICATED_PATCH | Freq: Once | TRANSDERMAL | Status: DC

## 2014-10-24 MED ORDER — PROPOFOL 10 MG/ML IV BOLUS
INTRAVENOUS | Status: AC
  Filled 2014-10-24: qty 40

## 2014-10-24 MED ORDER — DEXAMETHASONE SODIUM PHOSPHATE 4 MG/ML IJ SOLN
INTRAMUSCULAR | Status: DC | PRN
  Administered 2014-10-24: 10 mg via INTRAVENOUS

## 2014-10-24 MED ORDER — MIDAZOLAM HCL 2 MG/2ML IJ SOLN: 1.0000 mg | INTRAMUSCULAR | Status: DC | PRN

## 2014-10-24 MED ORDER — ONDANSETRON HCL 4 MG/2ML IJ SOLN
INTRAMUSCULAR | Status: DC | PRN
  Administered 2014-10-24: 4 mg via INTRAVENOUS

## 2014-10-24 MED ORDER — SCOPOLAMINE 1 MG/3DAYS TD PT72
MEDICATED_PATCH | TRANSDERMAL | Status: AC
  Filled 2014-10-24: qty 1

## 2014-10-24 MED ORDER — BUPIVACAINE HCL (PF) 0.25 % IJ SOLN
INTRAMUSCULAR | Status: AC
  Filled 2014-10-24: qty 30

## 2014-10-24 MED ORDER — HEPARIN SOD (PORK) LOCK FLUSH 100 UNIT/ML IV SOLN
INTRAVENOUS | Status: DC | PRN
  Administered 2014-10-24: 500 [IU] via INTRAVENOUS

## 2014-10-24 MED ORDER — CEFAZOLIN SODIUM-DEXTROSE 2-3 GM-% IV SOLR
2.0000 g | INTRAVENOUS | Status: AC
  Administered 2014-10-24: 2 g via INTRAVENOUS

## 2014-10-24 MED ORDER — MIDAZOLAM HCL 5 MG/5ML IJ SOLN
INTRAMUSCULAR | Status: DC | PRN
  Administered 2014-10-24: 2 mg via INTRAVENOUS

## 2014-10-24 MED ORDER — OXYCODONE HCL 5 MG/5ML PO SOLN: 5.0000 mg | Freq: Once | ORAL | Status: DC | PRN

## 2014-10-24 MED ORDER — MIDAZOLAM HCL 2 MG/2ML IJ SOLN: 0.5000 mg | Freq: Once | INTRAMUSCULAR | Status: DC | PRN

## 2014-10-24 MED ORDER — BUPIVACAINE-EPINEPHRINE (PF) 0.25% -1:200000 IJ SOLN
INTRAMUSCULAR | Status: AC
  Filled 2014-10-24: qty 30

## 2014-10-24 MED ORDER — SODIUM CHLORIDE 0.9 % IV SOLN
INTRAVENOUS | Status: DC
  Administered 2014-10-24: 07:00:00 via INTRAVENOUS

## 2014-10-24 MED ORDER — OXYCODONE HCL 5 MG PO TABS: 5.0000 mg | ORAL_TABLET | Freq: Once | ORAL | Status: DC | PRN

## 2014-10-24 MED ORDER — MEPERIDINE HCL 25 MG/ML IJ SOLN: 6.2500 mg | INTRAMUSCULAR | Status: DC | PRN

## 2014-10-24 MED ORDER — FENTANYL CITRATE 0.05 MG/ML IJ SOLN: 25.0000 ug | INTRAMUSCULAR | Status: DC | PRN

## 2014-10-24 MED ORDER — HEPARIN (PORCINE) IN NACL 2-0.9 UNIT/ML-% IJ SOLN
INTRAMUSCULAR | Status: AC
  Filled 2014-10-24: qty 500

## 2014-10-24 MED ORDER — HEPARIN (PORCINE) IN NACL 2-0.9 UNIT/ML-% IJ SOLN
INTRAMUSCULAR | Status: DC | PRN
  Administered 2014-10-24: 1 via INTRAVENOUS

## 2014-10-24 SURGICAL SUPPLY — 56 items
APL SKNCLS STERI-STRIP NONHPOA (GAUZE/BANDAGES/DRESSINGS)
BAG DECANTER FOR FLEXI CONT (MISCELLANEOUS) ×2 IMPLANT
BENZOIN TINCTURE PRP APPL 2/3 (GAUZE/BANDAGES/DRESSINGS) IMPLANT
BLADE HEX COATED 2.75 (ELECTRODE) ×2 IMPLANT
BLADE SURG 15 STRL LF DISP TIS (BLADE) ×1 IMPLANT
BLADE SURG 15 STRL SS (BLADE) ×4
CANISTER SUCT 1200ML W/VALVE (MISCELLANEOUS) IMPLANT
CHLORAPREP W/TINT 26ML (MISCELLANEOUS) ×2 IMPLANT
COVER BACK TABLE 60X90IN (DRAPES) ×2 IMPLANT
COVER MAYO STAND STRL (DRAPES) ×2 IMPLANT
COVER PROBE 5X48 (MISCELLANEOUS) ×2
DECANTER SPIKE VIAL GLASS SM (MISCELLANEOUS) IMPLANT
DRAPE C-ARM 42X72 X-RAY (DRAPES) ×2 IMPLANT
DRAPE LAPAROSCOPIC ABDOMINAL (DRAPES) ×2 IMPLANT
DRAPE UTILITY XL STRL (DRAPES) ×2 IMPLANT
DRSG TEGADERM 2-3/8X2-3/4 SM (GAUZE/BANDAGES/DRESSINGS) IMPLANT
DRSG TEGADERM 4X10 (GAUZE/BANDAGES/DRESSINGS) IMPLANT
DRSG TEGADERM 4X4.75 (GAUZE/BANDAGES/DRESSINGS) IMPLANT
ELECT REM PT RETURN 9FT ADLT (ELECTROSURGICAL) ×2
ELECTRODE REM PT RTRN 9FT ADLT (ELECTROSURGICAL) ×1 IMPLANT
GLOVE BIO SURGEON STRL SZ 6.5 (GLOVE) ×1 IMPLANT
GLOVE BIOGEL PI IND STRL 7.0 (GLOVE) IMPLANT
GLOVE BIOGEL PI INDICATOR 7.0 (GLOVE) ×2
GLOVE ECLIPSE 6.5 STRL STRAW (GLOVE) ×1 IMPLANT
GLOVE EUDERMIC 7 POWDERFREE (GLOVE) ×2 IMPLANT
GOWN STRL REUS W/ TWL LRG LVL3 (GOWN DISPOSABLE) ×1 IMPLANT
GOWN STRL REUS W/ TWL XL LVL3 (GOWN DISPOSABLE) ×1 IMPLANT
GOWN STRL REUS W/TWL LRG LVL3 (GOWN DISPOSABLE) ×4
GOWN STRL REUS W/TWL XL LVL3 (GOWN DISPOSABLE) ×2
IV CATH PLACEMENT UNIT 16 GA (IV SOLUTION) IMPLANT
IV KIT MINILOC 20X1 SAFETY (NEEDLE) IMPLANT
KIT BARDPORT ISP (Port) IMPLANT
KIT CVR 48X5XPRB PLUP LF (MISCELLANEOUS) ×1 IMPLANT
KIT PORT POWER 8FR ISP CVUE (Catheter) ×2 IMPLANT
LIQUID BAND (GAUZE/BANDAGES/DRESSINGS) ×2 IMPLANT
NDL BLUNT 17GA (NEEDLE) IMPLANT
NDL HYPO 25X1 1.5 SAFETY (NEEDLE) ×1 IMPLANT
NEEDLE BLUNT 17GA (NEEDLE) IMPLANT
NEEDLE HYPO 22GX1.5 SAFETY (NEEDLE) ×2 IMPLANT
NEEDLE HYPO 25X1 1.5 SAFETY (NEEDLE) ×2 IMPLANT
PACK BASIN DAY SURGERY FS (CUSTOM PROCEDURE TRAY) ×2 IMPLANT
PENCIL BUTTON HOLSTER BLD 10FT (ELECTRODE) ×2 IMPLANT
SET SHEATH INTRODUCER 10FR (MISCELLANEOUS) IMPLANT
SHEATH COOK PEEL AWAY SET 9F (SHEATH) IMPLANT
SLEEVE SCD COMPRESS KNEE MED (MISCELLANEOUS) ×2 IMPLANT
SPONGE GAUZE 4X4 12PLY STER LF (GAUZE/BANDAGES/DRESSINGS) IMPLANT
STRIP CLOSURE SKIN 1/2X4 (GAUZE/BANDAGES/DRESSINGS) IMPLANT
SUT MNCRL AB 4-0 PS2 18 (SUTURE) ×2 IMPLANT
SUT PROLENE 2 0 CT2 30 (SUTURE) ×2 IMPLANT
SUT VICRYL 3-0 CR8 SH (SUTURE) ×2 IMPLANT
SYR 5ML LUER SLIP (SYRINGE) ×2 IMPLANT
SYRINGE 10CC LL (SYRINGE) ×3 IMPLANT
TOWEL OR 17X24 6PK STRL BLUE (TOWEL DISPOSABLE) ×4 IMPLANT
TOWEL OR NON WOVEN STRL DISP B (DISPOSABLE) ×2 IMPLANT
TUBE CONNECTING 20X1/4 (TUBING) IMPLANT
YANKAUER SUCT BULB TIP NO VENT (SUCTIONS) IMPLANT

## 2014-10-24 NOTE — H&P (Signed)
Chief Complaint: "I'm getting a bone marrow biopsy"  Referring Physician(s): Sehbai,Aasim Shaheen  History of Present Illness: Maria Simmons is a 48 y.o. 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  Past Medical History  Diagnosis Date  . Cancer   . Diabetes mellitus   . Hypercholesterolemia   . Thyroid cancer   . Hypothyroidism   . Wears glasses   . PONV (postoperative nausea and vomiting)   . DLBCL (diffuse large B cell lymphoma)     Past Surgical History  Procedure Laterality Date  . 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  . Tubal ligation  1990  . Abdominal hysterectomy  2000  . Colonoscopy    . Lymph node biopsy Left 10/12/2014    Procedure: excisional biopsy deep left neck lymph node;  Surgeon: Fanny Skates, MD;  Location: [facility];  Service: General;  Laterality: Left;  . Laparoscopic gastric banding with hiatal hernia repair  05/28/2009    Allergies: Metformin and related; Tape; and Penicillins  Medications: Prior to Admission medications   Medication Sig Start Date End Date Taking? Authorizing Provider  allopurinol (ZYLOPRIM) 300 MG tablet Take 1 tablet (300 mg total) by mouth daily. Patient taking differently: Take 300 mg by mouth every morning.  10/19/14  Yes Aasim Marla Roe, MD  levothyroxine (SYNTHROID, LEVOTHROID) 150 MCG tablet Take 150 mcg by mouth daily before breakfast.   Yes Historical Provider, MD  Vitamin D, Cholecalciferol, 1000 UNITS TABS Take 2,000 Int'l Units by mouth daily at 12 noon.    Yes Historical Provider, MD  aspirin EC 81 MG tablet Take 81 mg by mouth every morning.     Historical Provider, MD  Biotin 10 MG TABS Take 1 tablet by mouth every morning.     Historical Provider, MD  Biotin 5000 MCG TABS Take 1 tablet by mouth every morning.    Historical Provider, MD    Cyanocobalamin (VITAMIN B 12 PO) Take 1 tablet by mouth every morning.     Historical Provider, MD  HYDROcodone-acetaminophen (NORCO) 5-325 MG per tablet Take 1-2 tablets by mouth every 6 (six) hours as needed for moderate pain or severe pain. 10/12/14   Fanny Skates, MD  lidocaine-prilocaine (EMLA) cream Apply cream to port-a-cath 1-2 hours prior to access. 10/23/14   Aasim Marla Roe, MD  LORazepam (ATIVAN) 0.5 MG tablet Take 1 tablet (0.5 mg total) by mouth every 6 (six) hours as needed (Nausea or vomiting). 10/19/14   Aasim Marla Roe, MD  Multiple Vitamin (MULITIVITAMIN WITH MINERALS) TABS Take 1 tablet by mouth every morning.     Historical Provider, MD  ondansetron (ZOFRAN) 8 MG tablet Take 1 tablet (8 mg total) by mouth 2 (two) times daily. Start the day after chemo for 3 days. Then as needed for nausea or vomiting. 10/19/14   Aasim Marla Roe, MD  predniSONE (DELTASONE) 20 MG tablet Take 5 tablets (100 mg total) by mouth daily. Take on days 1-5 of chemotherapy. 10/19/14   Aasim Marla Roe, MD  prochlorperazine (COMPAZINE) 10 MG tablet Take 1 tablet (10 mg total) by mouth every 6 (six) hours as needed (Nausea or vomiting). 10/19/14   Aasim Marla Roe, MD    Family History  Problem Relation Age of Onset  . Colon cancer Mother   . Prostate cancer Brother   . Cancer Father  prostate  . Hypertension Father   . Hyperlipidemia Father   . Stroke Father     History   Social History  . Marital Status: Divorced    Spouse Name: N/A    Number of Children: N/A  . Years of Education: N/A   Social History Main Topics  . Smoking status: Never Smoker   . Smokeless tobacco: Not on file  . Alcohol Use: No  . Drug Use: No  . Sexual Activity: Yes    Birth Control/ Protection: Surgical   Other Topics Concern  . Not on file   Social History Narrative         Review of Systems    Constitutional: Negative for fever and chills.  Respiratory: Negative for cough  and shortness of breath.   Cardiovascular: Negative for chest pain.  Gastrointestinal: Positive for abdominal pain. Negative for nausea and vomiting.       Hx recent blood in stool  Genitourinary: Negative for dysuria and hematuria.  Musculoskeletal: Positive for back pain.  Neurological: Negative for headaches.  Hematological: Does not bruise/bleed easily.  Psychiatric/Behavioral: The patient is nervous/anxious.     Vital Signs: BP 139/86 mmHg  Pulse 80  Temp(Src) 97.9 F (36.6 C) (Oral)  Resp 20  SpO2 100%  Physical Exam  Constitutional: She is oriented to person, place, and time. She appears well-developed and well-nourished.  Neck:  Left neck incision site from recent LN bx/excision clean and dry, NT, mild swelling  Cardiovascular: Normal rate and regular rhythm.   Pulmonary/Chest: Effort normal.  Sl dim BS rt base, left clear  Abdominal: Soft. Bowel sounds are normal. There is no tenderness.  Musculoskeletal: Normal range of motion. She exhibits no edema.  Neurological: She is alert and oriented to person, place, and time.    Imaging: No results found.  Labs:  CBC:  Recent Labs  09/05/14 0944 10/12/14 1225 10/19/14 1425 10/22/14 1316 10/24/14 0723  WBC 3.8*  --  4.3 3.3* 3.2*  HGB 11.3* 12.5 11.6 11.9 12.2  HCT 35.1  --  36.8 36.2 37.2  PLT 393  --  286 330 333    COAGS:  Recent Labs  10/24/14 0723  INR 1.01  APTT 29    BMP:  Recent Labs  09/05/14 0944 10/19/14 1426 10/22/14 1316  NA 142 138 140  K 3.9 4.4 3.6  CO2 31* 30* 29  GLUCOSE 111 104 92  BUN 10.2 10.8 9.6  CALCIUM 9.6 9.2 9.3  CREATININE 0.8 0.9 0.8    LIVER FUNCTION TESTS:  Recent Labs  09/05/14 0944 10/19/14 1426 10/22/14 1316  BILITOT 0.55 0.29 0.23  AST _0 ALT _1 ALKPHOS 80 103 91  PROT 7.2 6.9 7.2  ALBUMIN 3.4* 3.4* 3.5    TUMOR MARKERS: No results for input(s): AFPTM, CEA, CA199, CHROMGRNA in the last 8760 hours.  Assessment and  Plan: Maria Simmons is a 48 y.o. 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 Details/risks of procedure d/w pt/daughter with their understanding and consent. Pt also scheduled for echocardiogram and port a cath placement at [facility] this afternoon.            Signed: Autumn Messing 10/24/2014, 8:24 AM

## 2014-10-24 NOTE — Discharge Instructions (Signed)
° ° °PORT-A-CATH: POST OP INSTRUCTIONS ° °Always review your discharge instruction sheet given to you by the facility where your surgery was performed.  ° °1. A prescription for pain medication may be given to you upon discharge. Take your pain medication as prescribed, if needed. If narcotic pain medicine is not needed, then you make take acetaminophen (Tylenol) or ibuprofen (Advil) as needed.  °2. Take your usually prescribed medications unless otherwise directed. °3. If you need a refill on your pain medication, please contact our office. All narcotic pain medicine now requires a paper prescription.  Phoned in and fax refills are no longer allowed by law.  Prescriptions will not be filled after 5 pm or on weekends.  °4. You should follow a light diet for the remainder of the day after your procedure. °5. Most patients will experience some mild swelling and/or bruising in the area of the incision. It may take several days to resolve. °6. It is common to experience some constipation if taking pain medication after surgery. Increasing fluid intake and taking a stool softener (such as Colace) will usually help or prevent this problem from occurring. A mild laxative (Milk of Magnesia or Miralax) should be taken according to package directions if there are no bowel movements after 48 hours.  °7. Unless discharge instructions indicate otherwise, you may remove your bandages 48 hours after surgery, and you may shower at that time. You may have steri-strips (small white skin tapes) in place directly over the incision.  These strips should be left on the skin for 7-10 days.  If your surgeon used Dermabond (skin glue) on the incision, you may shower in 24 hours.  The glue will flake off over the next 2-3 weeks.  °8. If your port is left accessed at the end of surgery (needle left in port), the dressing cannot get wet and should only by changed by a healthcare professional. When the port is no longer accessed (when the  needle has been removed), follow step 7.   °9. ACTIVITIES:  Limit activity involving your arms for the next 72 hours. Do no strenuous exercise or activity for 1 week. You may drive when you are no longer taking prescription pain medication, you can comfortably wear a seatbelt, and you can maneuver your car. °10.You may need to see your doctor in the office for a follow-up appointment.  Please °      check with your doctor.  °11.When you receive a new Port-a-Cath, you will get a product guide and  °      ID card.  Please keep them in case you need them. ° °WHEN TO CALL YOUR DOCTOR ([phone]): °1. Fever over 101.0 °2. Chills °3. Continued bleeding from incision °4. Increased redness and tenderness at the site °5. Shortness of breath, difficulty breathing ° ° °The clinic staff is available to answer your questions during regular business hours. Please don’t hesitate to call and ask to speak to one of the nurses or medical assistants for clinical concerns. If you have a medical emergency, go to the nearest emergency room or call 911.  A surgeon from [facility] is always on call at the hospital.  ° ° ° °For further information, please visit [email or web address] ° ° °Post Anesthesia Home Care Instructions ° °Activity: °Get plenty of rest for the remainder of the day. A responsible adult should stay with you for 24 hours following the procedure.  °For the next 24 hours, DO NOT: °-Drive a car °-  Operate machinery °-Drink alcoholic beverages °-Take any medication unless instructed by your physician °-Make any legal decisions or sign important papers. ° °Meals: °Start with liquid foods such as gelatin or soup. Progress to regular foods as tolerated. Avoid greasy, spicy, heavy foods. If nausea and/or vomiting occur, drink only clear liquids until the nausea and/or vomiting subsides. Call your physician if vomiting continues. ° °Special Instructions/Symptoms: °Your throat may feel dry or sore from the  anesthesia or the breathing tube placed in your throat during surgery. If this causes discomfort, gargle with warm salt water. The discomfort should disappear within 24 hours. ° ° ° ° ° ° °

## 2014-10-24 NOTE — Op Note (Signed)
Patient Name:           Maria Simmons   Date of Surgery:        10/24/2014  Pre op Diagnosis:      Large B-cell lymphoma of head and neck  Post op Diagnosis:    same  Procedure:                 Insertion of 8 French power port clear view tunneled venous vascular access device Use of fluoroscopy for guidance and positioning Use of ultrasound for venipuncture  Surgeon:                     Edsel Petrin. Dalbert Batman, M.D., FACS  Assistant:                      OR staff  Operative Indications:   The patient is a 48 year old female who presents with a complaint of b cell lymphoma neck.  She recently  underwent excision of deep left jugular lymph node on October 12, 2014. This confirmed a large B-cell lymphoma.she is recovering from the biopsy without any complication. She has seen her oncologist, Dr. Lona Kettle, and his been advised to have chemotherapy. She was referred back for Port-A-Cath insertion. I discussed the techniques and numerous risks of this with her and she agrees with this plan.  Operative Findings:       Right subclavian venipuncture was unsuccessful. Right internal jugular venipuncture was successful and the catheter was threaded and positioned into the SVC near the right atrial junction under fluoroscopic guidance.  Procedure in Detail:          Following the induction of general LMA anesthesia the patient was positioned with a roll behind her shoulders and her arms at her sides. The neck and chest was prepped and draped in a sterile fashion. Intravenous antibiotics were given and surgical timeout was performed. 0.5% Marcaine with epinephrine was used as a local infiltration anesthetic. A right subclavian venipuncture was attempted but was unsuccessful. On the second pass I felt that I got arterial blood and abandoned this approach. I then brought the ultrasound to the operative field and positioning the patient for a right internal jugular venipuncture. The ultrasound I could visualize  the internal jugular vein and the carotid artery. A right internal jugular venipuncture was performed without difficulty and a guidewire threaded into the vena cava under fluoroscopic guidance. A small incision was made at  the wire insertion site. A 3.5  cm incision was made about 2 cm below the clavicle. Subcutaneous pocket was created at the level of the pectoralis fascia. Using a tunneling device I  passed the catheter from the neck incision to the port pocket incision. Using fluoroscopy I drew a template on the chest wall to guide positioning of the catheter so the catheter would be in the superior vena cava at the right atrial junction. Using the template I measured the catheter 25 cm in length. The catheter was then secured to the port with the locking device and the port and catheter flushed with heparinized saline. The port was sutured to the pectoralis fascia with 3 interrupted sutures of 2-0 Prolene. The dilator and peel-away sheath were easily threaded over the wire into the central venous circulation and the wire and dilator removed. The catheter was threaded into the peel-away sheath and the peel-away sheath removed. The catheter flushed easily and had excellent blood return. No arrhythmias. Fluoroscopy confirmed  the tip the catheter was in the SVC near the right atrial junction. The catheter was flushed with concentrated heparin. Subcutaneous  tissue was closed with 3-0 Vicryl sutures and the skin incisions closed with 4-0 Monocryl subcuticular sutures and Dermabond. The patient tolerated the procedure well was taken to PACU in stable condition. EBL 15 mL. Counts correct. Complications none. A postop chest x-ray is planned.     Edsel Petrin. Dalbert Batman, M.D., FACS General and Minimally Invasive Surgery Breast and Colorectal Surgery  10/24/2014 4:17 PM

## 2014-10-24 NOTE — Discharge Instructions (Signed)
Conscious Sedation, Adult, Care After  [facility]  [phone]  Refer to this sheet in the next few weeks. These instructions provide you with information on caring for yourself after your procedure. Your health care provider may also give you more specific instructions. Your treatment has been planned according to current medical practices, but problems sometimes occur. Call your health care provider if you have any problems or questions after your procedure. WHAT TO EXPECT AFTER THE PROCEDURE  After your procedure:  You may feel sleepy, clumsy, and have poor balance for several hours.  Vomiting may occur if you eat too soon after the procedure. HOME CARE INSTRUCTIONS  Do not participate in any activities where you could become injured for at least 24 hours. Do not:  Drive.  Swim.  Ride a bicycle.  Operate heavy machinery.  Cook.  Use power tools.  Climb ladders.  Work from a high place.  Do not make important decisions or sign legal documents until you are improved.  If you vomit, drink water, juice, or soup when you can drink without vomiting. Make sure you have little or no nausea before eating solid foods.  Only take over-the-counter or prescription medicines for pain, discomfort, or fever as directed by your health care provider.  Make sure you and your family fully understand everything about the medicines given to you, including what side effects may occur.  You should not drink alcohol, take sleeping pills, or take medicines that cause drowsiness for at least 24 hours.  If you smoke, do not smoke without supervision.  If you are feeling better, you may resume normal activities 24 hours after you were sedated.  Keep all appointments with your health care provider. SEEK MEDICAL CARE IF:  Your skin is pale or bluish in color.  You continue to feel nauseous or vomit.  Your pain is getting worse and is not helped by medicine.  You have bleeding or  swelling.  You are still sleepy or feeling clumsy after 24 hours. SEEK IMMEDIATE MEDICAL CARE IF:  You develop a rash.  You have difficulty breathing.  You develop any type of allergic problem.  You have a fever. MAKE SURE YOU:  Understand these instructions.  Will watch your condition.  Will get help right away if you are not doing well or get worse. Document Released: 09/20/2013 Document Reviewed: 09/20/2013 [facility] Patient Information 2015 [place], [place]. This information is not intended to replace advice given to you by your health care provider. Make sure you discuss any questions you have with your health care provider. Bone Biopsy, Needle, Care After Read the instructions outlined below and refer to this sheet in the next few weeks. These discharge instructions provide you with general information on caring for yourself after you leave the hospital. Your caregiver may also give you specific instructions. While your treatment has been planned according to the most current medical practices available, unavoidable complications sometimes occur. If you have any problems or questions after discharge, call your caregiver. Finding out the results of your test Not all test results are available during your visit. If your test results are not back during the visit, make an appointment with your caregiver to find out the results. Do not assume everything is normal if you have not heard from your caregiver or the medical facility. It is important for you to follow up on all of your test results.  SEEK MEDICAL CARE IF:   You have redness, swelling, or increasing pain  at the site of the biopsy.  You have pus coming from the biopsy site.  You have drainage from the biopsy site lasting longer than 1 day.  You notice a bad smell coming from the biopsy site or dressing.  You develop persistent nausea or vomiting. SEEK IMMEDIATE MEDICAL CARE IF:  You have a fever.  You develop a  rash.  You have difficulty breathing.  You develop any reaction or side effects to medicines given. Document Released: 06/19/2005 Document Revised: 09/20/2013 Document Reviewed: 05/07/2009 [facility] Patient Information 2015 [place], [place]. This information is not intended to replace advice given to you by your health care provider. Make sure you discuss any questions you have with your health care provider.

## 2014-10-24 NOTE — Progress Notes (Signed)
Echo Lab  2D Echocardiogram completed.  [place], RDCS 10/24/2014 1:57 PM

## 2014-10-24 NOTE — Anesthesia Postprocedure Evaluation (Signed)
  Anesthesia Post-op Note  Patient: Maria Simmons  Procedure(s) Performed: Procedure(s): INSERTION PORT-A-CATH WITH ULTRASOUND GUIDANCE (Right)  Patient Location: PACU  Anesthesia Type:General  Level of Consciousness: awake, alert , oriented and patient cooperative  Airway and Oxygen Therapy: Patient Spontanous Breathing  Post-op Pain: mild  Post-op Assessment: Post-op Vital signs reviewed, Patient's Cardiovascular Status Stable, Respiratory Function Stable, Patent Airway, No signs of Nausea or vomiting and Pain level controlled  Post-op Vital Signs: Reviewed and stable  Last Vitals:  Filed Vitals:   10/24/14 1645  BP: 143/71  Pulse: 58  Temp:   Resp:     Complications: No apparent anesthesia complications

## 2014-10-24 NOTE — Telephone Encounter (Signed)
desk nurse stopped by yesterday 11/10 to check on status of echo. checked w/Linda in preauth for update and per Vaughan Basta no preauth required. desk/nurse pt both at scheduling desk and given appt for echo 11/11 @ 3pm @ [facility]. per pt she also just got an appt for port placement to be done that afternoon (11/11) @ [place]. pt instructed by desk nurse to just keep appt for echo and she will contact short stay and take care of making sure/ the echo gets done prior to port placement. pt has other appts and was provided a new schedule that includes echo for 11/11.

## 2014-10-24 NOTE — Interval H&P Note (Signed)
History and Physical Interval Note:  10/24/2014 3:04 PM  Maria Simmons  has presented today for surgery, with the diagnosis of LARGE B-CELL LYMPHOMA OF NECK  The various methods of treatment have been discussed with the patient and family. After consideration of risks, benefits and other options for treatment, the patient has consented to  Procedure(s): INSERTION PORT-A-CATH WITH ULTRASOUND GUIDANCE (N/A) as a surgical intervention .  The patient's history has been reviewed, patient examined today, no change in status, stable for surgery.  I have reviewed the patient's chart and labs.  Questions were answered to the patient's satisfaction.     Adin Hector

## 2014-10-24 NOTE — Anesthesia Preprocedure Evaluation (Addendum)
Anesthesia Evaluation  Patient identified by MRN, date of birth, ID band Patient awake    Reviewed: Allergy & Precautions, H&P , NPO status , Patient's Chart, lab work & pertinent test results  History of Anesthesia Complications Negative for: history of anesthetic complications  Airway Mallampati: I  TM Distance: >3 FB Neck ROM: Full    Dental  (+) Teeth Intact, Dental Advisory Given   Pulmonary neg pulmonary ROS,  breath sounds clear to auscultation        Cardiovascular negative cardio ROS  Rhythm:Regular Rate:Normal     Neuro/Psych negative neurological ROS     GI/Hepatic Neg liver ROS, S/p lap band   Endo/Other  diabetes (diet controlled, resolved with lap band)Hypothyroidism Morbid obesity  Renal/GU negative Renal ROS     Musculoskeletal   Abdominal (+) + obese,   Peds  Hematology   Anesthesia Other Findings Thyroid cancer lymphoma  Reproductive/Obstetrics                           Anesthesia Physical Anesthesia Plan  ASA: III  Anesthesia Plan: General   Post-op Pain Management:    Induction: Intravenous  Airway Management Planned: LMA  Additional Equipment:   Intra-op Plan:   Post-operative Plan:   Informed Consent: I have reviewed the patients History and Physical, chart, labs and discussed the procedure including the risks, benefits and alternatives for the proposed anesthesia with the patient or authorized representative who has indicated his/her understanding and acceptance.   Dental advisory given  Plan Discussed with: CRNA and Surgeon  Anesthesia Plan Comments: (Plan routine monitors, GA- LMA OK)        Anesthesia Quick Evaluation

## 2014-10-24 NOTE — Procedures (Signed)
BM Bx L iliac No comp

## 2014-10-24 NOTE — Transfer of Care (Signed)
Immediate Anesthesia Transfer of Care Note  Patient: Maria Simmons  Procedure(s) Performed: Procedure(s): INSERTION PORT-A-CATH WITH ULTRASOUND GUIDANCE (Right)  Patient Location: PACU  Anesthesia Type:General  Level of Consciousness: awake, sedated and patient cooperative  Airway & Oxygen Therapy: Patient Spontanous Breathing and Patient connected to face mask oxygen  Post-op Assessment: Report given to PACU RN and Post -op Vital signs reviewed and stable  Post vital signs: Reviewed and stable  Complications: No apparent anesthesia complications

## 2014-10-25 ENCOUNTER — Ambulatory Visit (HOSPITAL_BASED_OUTPATIENT_CLINIC_OR_DEPARTMENT_OTHER): Payer: 59

## 2014-10-25 ENCOUNTER — Encounter (HOSPITAL_BASED_OUTPATIENT_CLINIC_OR_DEPARTMENT_OTHER): Payer: Self-pay | Admitting: General Surgery

## 2014-10-25 DIAGNOSIS — Z5112 Encounter for antineoplastic immunotherapy: Secondary | ICD-10-CM

## 2014-10-25 DIAGNOSIS — C801 Malignant (primary) neoplasm, unspecified: Secondary | ICD-10-CM

## 2014-10-25 DIAGNOSIS — C8330 Diffuse large B-cell lymphoma, unspecified site: Secondary | ICD-10-CM

## 2014-10-25 DIAGNOSIS — Z5111 Encounter for antineoplastic chemotherapy: Secondary | ICD-10-CM

## 2014-10-25 MED ORDER — DEXAMETHASONE SODIUM PHOSPHATE 20 MG/5ML IJ SOLN
20.0000 mg | Freq: Once | INTRAMUSCULAR | Status: AC
  Administered 2014-10-25: 20 mg via INTRAVENOUS

## 2014-10-25 MED ORDER — ONDANSETRON 16 MG/50ML IVPB (CHCC)
INTRAVENOUS | Status: AC
  Filled 2014-10-25: qty 16

## 2014-10-25 MED ORDER — DIPHENHYDRAMINE HCL 25 MG PO CAPS
50.0000 mg | ORAL_CAPSULE | Freq: Once | ORAL | Status: AC
  Administered 2014-10-25: 50 mg via ORAL

## 2014-10-25 MED ORDER — ACETAMINOPHEN 325 MG PO TABS
ORAL_TABLET | ORAL | Status: AC
  Filled 2014-10-25: qty 2

## 2014-10-25 MED ORDER — DIPHENHYDRAMINE HCL 25 MG PO CAPS
ORAL_CAPSULE | ORAL | Status: AC
  Filled 2014-10-25: qty 2

## 2014-10-25 MED ORDER — ONDANSETRON 16 MG/50ML IVPB (CHCC)
16.0000 mg | Freq: Once | INTRAVENOUS | Status: AC
  Administered 2014-10-25: 16 mg via INTRAVENOUS

## 2014-10-25 MED ORDER — DEXAMETHASONE SODIUM PHOSPHATE 20 MG/5ML IJ SOLN
INTRAMUSCULAR | Status: AC
  Filled 2014-10-25: qty 5

## 2014-10-25 MED ORDER — HEPARIN SOD (PORK) LOCK FLUSH 100 UNIT/ML IV SOLN
500.0000 [IU] | Freq: Once | INTRAVENOUS | Status: AC | PRN
  Administered 2014-10-25: 500 [IU]
  Filled 2014-10-25: qty 5

## 2014-10-25 MED ORDER — SODIUM CHLORIDE 0.9 % IV SOLN
2.0000 mg | Freq: Once | INTRAVENOUS | Status: AC
  Administered 2014-10-25: 2 mg via INTRAVENOUS
  Filled 2014-10-25: qty 2

## 2014-10-25 MED ORDER — SODIUM CHLORIDE 0.9 % IV SOLN
Freq: Once | INTRAVENOUS | Status: AC | PRN
  Administered 2014-10-25: 16:00:00 via INTRAVENOUS

## 2014-10-25 MED ORDER — SODIUM CHLORIDE 0.9 % IV SOLN
375.0000 mg/m2 | Freq: Once | INTRAVENOUS | Status: AC
  Administered 2014-10-25: 700 mg via INTRAVENOUS
  Filled 2014-10-25: qty 70

## 2014-10-25 MED ORDER — SODIUM CHLORIDE 0.9 % IJ SOLN
10.0000 mL | INTRAMUSCULAR | Status: DC | PRN
  Administered 2014-10-25: 10 mL
  Filled 2014-10-25: qty 10

## 2014-10-25 MED ORDER — METHYLPREDNISOLONE SODIUM SUCC 125 MG IJ SOLR
125.0000 mg | Freq: Once | INTRAMUSCULAR | Status: AC | PRN
  Administered 2014-10-25: 125 mg via INTRAVENOUS

## 2014-10-25 MED ORDER — DOXORUBICIN HCL CHEMO IV INJECTION 2 MG/ML
50.0000 mg/m2 | Freq: Once | INTRAVENOUS | Status: AC
  Administered 2014-10-25: 94 mg via INTRAVENOUS
  Filled 2014-10-25: qty 47

## 2014-10-25 MED ORDER — DIPHENHYDRAMINE HCL 50 MG/ML IJ SOLN
25.0000 mg | Freq: Once | INTRAMUSCULAR | Status: AC | PRN
  Administered 2014-10-25: 25 mg via INTRAVENOUS

## 2014-10-25 MED ORDER — SODIUM CHLORIDE 0.9 % IV SOLN: Freq: Once | INTRAVENOUS | Status: DC

## 2014-10-25 MED ORDER — DIPHENHYDRAMINE HCL 50 MG/ML IJ SOLN
25.0000 mg | Freq: Once | INTRAMUSCULAR | Status: AC
  Administered 2014-10-25: 25 mg via INTRAVENOUS

## 2014-10-25 MED ORDER — FAMOTIDINE IN NACL 20-0.9 MG/50ML-% IV SOLN
20.0000 mg | Freq: Once | INTRAVENOUS | Status: AC | PRN
  Administered 2014-10-25: 20 mg via INTRAVENOUS

## 2014-10-25 MED ORDER — ACETAMINOPHEN 325 MG PO TABS
650.0000 mg | ORAL_TABLET | Freq: Once | ORAL | Status: AC
  Administered 2014-10-25: 650 mg via ORAL

## 2014-10-25 MED ORDER — SODIUM CHLORIDE 0.9 % IV SOLN
750.0000 mg/m2 | Freq: Once | INTRAVENOUS | Status: AC
  Administered 2014-10-25: 1400 mg via INTRAVENOUS
  Filled 2014-10-25: qty 70

## 2014-10-25 NOTE — Progress Notes (Unsigned)
Spoke with patient in infusion area today: Has had no more diarrhea since Monday and no further rectal bleeding.

## 2014-10-25 NOTE — Patient Instructions (Signed)
[facility] Discharge Instructions for Patients Receiving Chemotherapy  Today you received the following chemotherapy agents Rituxan, Cytoxan, Vincristine, Adriamycin  To help prevent nausea and vomiting after your treatment, we encourage you to take your nausea medication as directed.   If you develop nausea and vomiting that is not controlled by your nausea medication, call the clinic.   BELOW ARE SYMPTOMS THAT SHOULD BE REPORTED IMMEDIATELY:  *FEVER GREATER THAN 100.5 F  *CHILLS WITH OR WITHOUT FEVER  NAUSEA AND VOMITING THAT IS NOT CONTROLLED WITH YOUR NAUSEA MEDICATION  *UNUSUAL SHORTNESS OF BREATH  *UNUSUAL BRUISING OR BLEEDING  TENDERNESS IN MOUTH AND THROAT WITH OR WITHOUT PRESENCE OF ULCERS  *URINARY PROBLEMS  *BOWEL PROBLEMS  UNUSUAL RASH Items with * indicate a potential emergency and should be followed up as soon as possible.  Feel free to call the clinic you have any questions or concerns. The clinic phone number is [phone].

## 2014-10-25 NOTE — Progress Notes (Unsigned)
1547 at end of completion of 150mg /hr infusion of Rituxan (3rd "bump up" or 69 ml/hr x34 ml) patient reports "scratchy throat" and horseness as in she tries to clear her throat but won't clear. She reports this is a new sensation. Rituxan held, VS obtained (WNL) and hypersensitivity protocol initiated.  1548 NS wide open [place], NP at chairside 1552 solumedrol 125 mg given IVP 1553 benadryl 25 mg given IVP 1553 pepcid 20 mg given IVPB 1600 NS continues at 500 cc/hr x30 minutes per NP 1602 patient resting at this time, informed to notify staff with any changes. She verbalizes understanding.  1616 patient reports improvement in symptoms but not completely resolved.  [place] notified patient's symptoms still present but improved. Per NP, give additional 25 mg benadryl and proceed with rechallenge at last tolerated rate.  1627 Rituxan restarted at 100 mg/hr (46 ml/hr x23 ml)

## 2014-10-26 ENCOUNTER — Ambulatory Visit (HOSPITAL_BASED_OUTPATIENT_CLINIC_OR_DEPARTMENT_OTHER): Payer: 59

## 2014-10-26 DIAGNOSIS — C8330 Diffuse large B-cell lymphoma, unspecified site: Secondary | ICD-10-CM | POA: Diagnosis not present

## 2014-10-26 DIAGNOSIS — Z5189 Encounter for other specified aftercare: Secondary | ICD-10-CM

## 2014-10-26 DIAGNOSIS — C801 Malignant (primary) neoplasm, unspecified: Secondary | ICD-10-CM

## 2014-10-26 MED ORDER — PEGFILGRASTIM INJECTION 6 MG/0.6ML ~~LOC~~
6.0000 mg | PREFILLED_SYRINGE | Freq: Once | SUBCUTANEOUS | Status: AC
  Administered 2014-10-26: 6 mg via SUBCUTANEOUS
  Filled 2014-10-26: qty 0.6

## 2014-10-26 NOTE — Patient Instructions (Signed)
Pegfilgrastim injection What is this medicine? PEGFILGRASTIM (peg fil GRA stim) is a long-acting granulocyte colony-stimulating factor that stimulates the growth of neutrophils, a type of white blood cell important in the body's fight against infection. It is used to reduce the incidence of fever and infection in patients with certain types of cancer who are receiving chemotherapy that affects the bone marrow. This medicine may be used for other purposes; ask your health care provider or pharmacist if you have questions. COMMON BRAND NAME(S): Neulasta What should I tell my health care provider before I take this medicine? They need to know if you have any of these conditions: -latex allergy -ongoing radiation therapy -sickle cell disease -skin reactions to acrylic adhesives (On-Body Injector only) -an unusual or allergic reaction to pegfilgrastim, filgrastim, other medicines, foods, dyes, or preservatives -pregnant or trying to get pregnant -breast-feeding How should I use this medicine? This medicine is for injection under the skin. If you get this medicine at home, you will be taught how to prepare and give the pre-filled syringe or how to use the On-body Injector. Refer to the patient Instructions for Use for detailed instructions. Use exactly as directed. Take your medicine at regular intervals. Do not take your medicine more often than directed. It is important that you put your used needles and syringes in a special sharps container. Do not put them in a trash can. If you do not have a sharps container, call your pharmacist or healthcare provider to get one. Talk to your pediatrician regarding the use of this medicine in children. Special care may be needed. Overdosage: If you think you have taken too much of this medicine contact a poison control center or emergency room at once. NOTE: This medicine is only for you. Do not share this medicine with others. What if I miss a dose? It is  important not to miss your dose. Call your doctor or health care professional if you miss your dose. If you miss a dose due to an On-body Injector failure or leakage, a new dose should be administered as soon as possible using a single prefilled syringe for manual use. What may interact with this medicine? Interactions have not been studied. Give your health care provider a list of all the medicines, herbs, non-prescription drugs, or dietary supplements you use. Also tell them if you smoke, drink alcohol, or use illegal drugs. Some items may interact with your medicine. This list may not describe all possible interactions. Give your health care provider a list of all the medicines, herbs, non-prescription drugs, or dietary supplements you use. Also tell them if you smoke, drink alcohol, or use illegal drugs. Some items may interact with your medicine. What should I watch for while using this medicine? You may need blood work done while you are taking this medicine. If you are going to need a MRI, CT scan, or other procedure, tell your doctor that you are using this medicine (On-Body Injector only). What side effects may I notice from receiving this medicine? Side effects that you should report to your doctor or health care professional as soon as possible: -allergic reactions like skin rash, itching or hives, swelling of the face, lips, or tongue -dizziness -fever -pain, redness, or irritation at site where injected -pinpoint red spots on the skin -shortness of breath or breathing problems -stomach or side pain, or pain at the shoulder -swelling -tiredness -trouble passing urine Side effects that usually do not require medical attention (report to your doctor   or health care professional if they continue or are bothersome): -bone pain -muscle pain This list may not describe all possible side effects. Call your doctor for medical advice about side effects. You may report side effects to FDA at  1-800-FDA-1088. Where should I keep my medicine? Keep out of the reach of children. Store pre-filled syringes in a refrigerator between 2 and 8 degrees C (36 and 46 degrees F). Do not freeze. Keep in carton to protect from light. Throw away this medicine if it is left out of the refrigerator for more than 48 hours. Throw away any unused medicine after the expiration date. NOTE: This sheet is a summary. It may not cover all possible information. If you have questions about this medicine, talk to your doctor, pharmacist, or health care provider.  2015, Elsevier/Gold Standard. (2014-03-01 16:14:05)  

## 2014-10-29 ENCOUNTER — Telehealth: Payer: Self-pay

## 2014-10-29 ENCOUNTER — Encounter: Payer: Self-pay | Admitting: Hematology

## 2014-10-29 NOTE — Progress Notes (Signed)
Put fmla form on nurse's desk °

## 2014-10-29 NOTE — Telephone Encounter (Signed)
Pt called earlier stating she had pain behind her ears going down her neck. It is in her throat and back of tongue as well. The pain is when she swallows solids or liquids, worse on the first swallow than subsequent swallows. The pain is sharp.  Denies white patches in mouth, denies fever, denies nausea/vomiting, denies diarrhea. Cycle 1 of R-CHOP on 11/12 with neulasta on 11/13. F/u appt on 11/19. Dr Lona Kettle said to take her pain meds tonight a little more. He feels it can be the chemo working on her lymphoma in her neck creating a local pain symptom. She reported taking 1/2 tab not too frequently, and it did help for a little while. Instructed she could take 1/2 tab every 3 hrs or take the full tab as prescribed every 6. Call tomorrow to f/u on symptoms and we can bring her in for symptom management visit if needed.

## 2014-10-30 ENCOUNTER — Encounter: Payer: Self-pay | Admitting: Hematology

## 2014-10-30 NOTE — Progress Notes (Signed)
Faxed fmla form to [employer] @ [phone] °

## 2014-10-31 ENCOUNTER — Encounter: Payer: Self-pay | Admitting: Hematology

## 2014-10-31 NOTE — Progress Notes (Signed)
Faxed daughter's fmla form to [employer] @ [phone] °

## 2014-11-01 ENCOUNTER — Encounter (HOSPITAL_COMMUNITY): Payer: Self-pay | Admitting: Emergency Medicine

## 2014-11-01 ENCOUNTER — Encounter: Payer: Self-pay | Admitting: Hematology

## 2014-11-01 ENCOUNTER — Other Ambulatory Visit (HOSPITAL_BASED_OUTPATIENT_CLINIC_OR_DEPARTMENT_OTHER): Payer: 59

## 2014-11-01 ENCOUNTER — Ambulatory Visit (HOSPITAL_BASED_OUTPATIENT_CLINIC_OR_DEPARTMENT_OTHER): Payer: 59 | Admitting: Hematology

## 2014-11-01 ENCOUNTER — Emergency Department (HOSPITAL_COMMUNITY): Payer: 59

## 2014-11-01 ENCOUNTER — Telehealth: Payer: Self-pay | Admitting: Hematology

## 2014-11-01 ENCOUNTER — Emergency Department (HOSPITAL_COMMUNITY)
Admission: EM | Admit: 2014-11-01 | Discharge: 2014-11-01 | Disposition: A | Discharge status: Home / Self Care | Payer: 59 | Attending: Emergency Medicine | Admitting: Emergency Medicine

## 2014-11-01 ENCOUNTER — Other Ambulatory Visit: Payer: Self-pay

## 2014-11-01 VITALS — BP 125/72 | HR 119 | Temp 100.5°F | Resp 20 | Ht 63.0 in | Wt 167.1 lb

## 2014-11-01 DIAGNOSIS — Z85850 Personal history of malignant neoplasm of thyroid: Secondary | ICD-10-CM | POA: Insufficient documentation

## 2014-11-01 DIAGNOSIS — Z7952 Long term (current) use of systemic steroids: Secondary | ICD-10-CM | POA: Diagnosis not present

## 2014-11-01 DIAGNOSIS — E119 Type 2 diabetes mellitus without complications: Secondary | ICD-10-CM | POA: Insufficient documentation

## 2014-11-01 DIAGNOSIS — R5081 Fever presenting with conditions classified elsewhere: Secondary | ICD-10-CM

## 2014-11-01 DIAGNOSIS — C8330 Diffuse large B-cell lymphoma, unspecified site: Secondary | ICD-10-CM

## 2014-11-01 DIAGNOSIS — R0602 Shortness of breath: Secondary | ICD-10-CM | POA: Insufficient documentation

## 2014-11-01 DIAGNOSIS — D709 Neutropenia, unspecified: Secondary | ICD-10-CM | POA: Insufficient documentation

## 2014-11-01 DIAGNOSIS — Z79899 Other long term (current) drug therapy: Secondary | ICD-10-CM | POA: Insufficient documentation

## 2014-11-01 DIAGNOSIS — R0789 Other chest pain: Secondary | ICD-10-CM | POA: Insufficient documentation

## 2014-11-01 DIAGNOSIS — E039 Hypothyroidism, unspecified: Secondary | ICD-10-CM | POA: Insufficient documentation

## 2014-11-01 DIAGNOSIS — Z9889 Other specified postprocedural states: Secondary | ICD-10-CM | POA: Diagnosis not present

## 2014-11-01 DIAGNOSIS — Z8579 Personal history of other malignant neoplasms of lymphoid, hematopoietic and related tissues: Secondary | ICD-10-CM | POA: Insufficient documentation

## 2014-11-01 DIAGNOSIS — Z880 Allergy status to penicillin: Secondary | ICD-10-CM | POA: Diagnosis not present

## 2014-11-01 DIAGNOSIS — Z7982 Long term (current) use of aspirin: Secondary | ICD-10-CM | POA: Diagnosis not present

## 2014-11-01 HISTORY — DX: Neutropenia, unspecified: D70.9

## 2014-11-01 LAB — CBC WITH DIFFERENTIAL/PLATELET
BASO%: 0 % (ref 0.0–2.0)
BASOS ABS: 0 10*3/uL (ref 0.0–0.1)
BASOS ABS: 0 10*3/uL (ref 0.0–0.1)
Basophils Relative: 2 % — ABNORMAL HIGH (ref 0–1)
EOS ABS: 0 10*3/uL (ref 0.0–0.7)
EOS%: 4.9 % (ref 0.0–7.0)
Eosinophils Absolute: 0 10*3/uL (ref 0.0–0.5)
Eosinophils Relative: 2 % (ref 0–5)
HCT: 34 % — ABNORMAL LOW (ref 36.0–46.0)
HEMATOCRIT: 35.1 % (ref 34.8–46.6)
HGB: 11.4 g/dL — ABNORMAL LOW (ref 11.6–15.9)
Hemoglobin: 10.9 g/dL — ABNORMAL LOW (ref 12.0–15.0)
LYMPH#: 0.5 10*3/uL — AB (ref 0.9–3.3)
LYMPH%: 78.7 % — ABNORMAL HIGH (ref 14.0–49.7)
Lymphocytes Relative: 81 % — ABNORMAL HIGH (ref 12–46)
Lymphs Abs: 0.5 10*3/uL — ABNORMAL LOW (ref 0.7–4.0)
MCH: 26.6 pg (ref 25.1–34.0)
MCH: 26.5 pg (ref 26.0–34.0)
MCHC: 32.5 g/dL (ref 31.5–36.0)
MCHC: 32.1 g/dL (ref 30.0–36.0)
MCV: 82 fL (ref 79.5–101.0)
MCV: 82.7 fL (ref 78.0–100.0)
MONO ABS: 0.1 10*3/uL (ref 0.1–1.0)
MONO#: 0.1 10*3/uL (ref 0.1–0.9)
MONO%: 13.1 % (ref 0.0–14.0)
Monocytes Relative: 13 % — ABNORMAL HIGH (ref 3–12)
NEUT#: 0 10*3/uL — CL (ref 1.5–6.5)
NEUT%: 3.3 % — ABNORMAL LOW (ref 38.4–76.8)
NEUTROS ABS: 0 10*3/uL — AB (ref 1.7–7.7)
Neutrophils Relative %: 2 % — ABNORMAL LOW (ref 43–77)
Platelets: 179 10*3/uL (ref 145–400)
Platelets: 173 10*3/uL (ref 150–400)
RBC: 4.28 10*6/uL (ref 3.70–5.45)
RBC: 4.11 MIL/uL (ref 3.87–5.11)
RDW: 12.8 % (ref 11.2–14.5)
RDW: 12.7 % (ref 11.5–15.5)
WBC: 0.6 10*3/uL — CL (ref 3.9–10.3)
WBC: 0.6 10*3/uL — CL (ref 4.0–10.5)
nRBC: 0 % (ref 0–0)

## 2014-11-01 LAB — URINALYSIS, ROUTINE W REFLEX MICROSCOPIC
Bilirubin Urine: NEGATIVE
Glucose, UA: NEGATIVE mg/dL
HGB URINE DIPSTICK: NEGATIVE
Ketones, ur: NEGATIVE mg/dL
LEUKOCYTES UA: NEGATIVE
NITRITE: NEGATIVE
PROTEIN: NEGATIVE mg/dL
SPECIFIC GRAVITY, URINE: 1.023 (ref 1.005–1.030)
Urobilinogen, UA: 0.2 mg/dL (ref 0.0–1.0)
pH: 6.5 (ref 5.0–8.0)

## 2014-11-01 LAB — COMPREHENSIVE METABOLIC PANEL
ALBUMIN: 3.5 g/dL (ref 3.5–5.2)
ALK PHOS: 86 U/L (ref 39–117)
ALT: 9 U/L (ref 0–35)
ANION GAP: 15 (ref 5–15)
AST: 9 U/L (ref 0–37)
BUN: 9 mg/dL (ref 6–23)
CO2: 26 mEq/L (ref 19–32)
Calcium: 9 mg/dL (ref 8.4–10.5)
Chloride: 99 mEq/L (ref 96–112)
Creatinine, Ser: 0.63 mg/dL (ref 0.50–1.10)
GFR calc non Af Amer: >90 mL/min (ref >90)
GLUCOSE: 101 mg/dL — AB (ref 70–99)
POTASSIUM: 4.2 meq/L (ref 3.7–5.3)
Sodium: 140 mEq/L (ref 137–147)
TOTAL PROTEIN: 6.7 g/dL (ref 6.0–8.3)
Total Bilirubin: 0.6 mg/dL (ref 0.3–1.2)

## 2014-11-01 LAB — COMPREHENSIVE METABOLIC PANEL (CC13)
ALBUMIN: 3.5 g/dL (ref 3.5–5.0)
ALK PHOS: 93 U/L (ref 40–150)
ALT: 9 U/L (ref 0–55)
AST: 8 U/L (ref 5–34)
Anion Gap: 9 mEq/L (ref 3–11)
BUN: 9.9 mg/dL (ref 7.0–26.0)
CALCIUM: 9.4 mg/dL (ref 8.4–10.4)
CO2: 29 mEq/L (ref 22–29)
CREATININE: 0.8 mg/dL (ref 0.6–1.1)
Chloride: 99 mEq/L (ref 98–109)
GLUCOSE: 105 mg/dL (ref 70–140)
POTASSIUM: 4.6 meq/L (ref 3.5–5.1)
Sodium: 137 mEq/L (ref 136–145)
Total Bilirubin: 0.71 mg/dL (ref 0.20–1.20)
Total Protein: 6.7 g/dL (ref 6.4–8.3)

## 2014-11-01 LAB — I-STAT CG4 LACTIC ACID, ED
Lactic Acid, Venous: 0.87 mmol/L (ref 0.5–2.2)
Lactic Acid, Venous: 0.9 mmol/L (ref 0.5–2.2)

## 2014-11-01 MED ORDER — SODIUM CHLORIDE 0.9 % IJ SOLN
INTRAMUSCULAR | Status: AC
  Administered 2014-11-01: 10 mL
  Filled 2014-11-01: qty 10

## 2014-11-01 MED ORDER — VANCOMYCIN HCL 1000 MG IV SOLR: 1000.0000 mg | Freq: Once | INTRAVENOUS | Status: DC

## 2014-11-01 MED ORDER — VANCOMYCIN HCL IN DEXTROSE 1-5 GM/200ML-% IV SOLN
1000.0000 mg | Freq: Once | INTRAVENOUS | Status: AC
  Administered 2014-11-01: 1000 mg via INTRAVENOUS
  Filled 2014-11-01: qty 200

## 2014-11-01 MED ORDER — PIPERACILLIN-TAZOBACTAM 3.375 G IVPB
3.3750 g | Freq: Once | INTRAVENOUS | Status: AC
  Administered 2014-11-01: 3.375 g via INTRAVENOUS
  Filled 2014-11-01: qty 50

## 2014-11-01 MED ORDER — SODIUM CHLORIDE 0.9 % IV BOLUS (SEPSIS)
500.0000 mL | INTRAVENOUS | Status: AC
  Administered 2014-11-01: 500 mL via INTRAVENOUS

## 2014-11-01 MED ORDER — LEVOFLOXACIN 500 MG PO TABS: 500.0000 mg | ORAL_TABLET | Freq: Every day | ORAL | Status: DC

## 2014-11-01 MED ORDER — HEPARIN SOD (PORK) LOCK FLUSH 100 UNIT/ML IV SOLN
500.0000 [IU] | Freq: Once | INTRAVENOUS | Status: AC
  Administered 2014-11-01: 500 [IU]
  Filled 2014-11-01: qty 5

## 2014-11-01 MED ORDER — SODIUM CHLORIDE 0.9 % IV BOLUS (SEPSIS)
1000.0000 mL | INTRAVENOUS | Status: AC
  Administered 2014-11-01 (×2): 1000 mL via INTRAVENOUS

## 2014-11-01 MED ORDER — DIPHENHYDRAMINE HCL 50 MG/ML IJ SOLN
25.0000 mg | Freq: Once | INTRAMUSCULAR | Status: AC
  Administered 2014-11-01: 25 mg via INTRAVENOUS
  Filled 2014-11-01: qty 1

## 2014-11-01 NOTE — ED Notes (Signed)
Per pt, sent here from cancer center with temp and low WBC-patient states she feels "wiped out" since chemo on thurday

## 2014-11-01 NOTE — Telephone Encounter (Signed)
lvm for pt regarding 11.23 appt.Marland KitchenMarland KitchenMarland Kitchen
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
## 2014-11-01 NOTE — Discharge Instructions (Signed)
Please pick up the levaquin and start the antibiotics as requested by the Oncologist.  Please return to the ER if your symptoms worsen; you have increased pain, fevers, chills, inability to keep any medications down, confusion. Otherwise see the outpatient doctor as requested.   Neutropenia Neutropenia is a condition that occurs when the level of a certain type of white blood cell (neutrophil) in your body becomes lower than normal. Neutrophils are made in the bone marrow and fight infections. These cells protect against bacteria and viruses. The fewer neutrophils you have, and the longer your body remains without them, the greater your risk of getting a severe infection becomes. CAUSES  The cause of neutropenia may be hard to determine. However, it is usually due to 3 main problems:   Decreased production of neutrophils. This may be due to:  Certain medicines such as chemotherapy.  Genetic problems.  Cancer.  Radiation treatments.  Vitamin deficiency.  Some pesticides.  Increased destruction of neutrophils. This may be due to:  Overwhelming infections.  Hemolytic anemia. This is when the body destroys its own blood cells.  Chemotherapy.  Neutrophils moving to areas of the body where they cannot fight infections. This may be due to:  Dialysis procedures.  Conditions where the spleen becomes enlarged. Neutrophils are held in the spleen and are not available to the rest of the body.  Overwhelming infections. The neutrophils are held in the area of the infection and are not available to the rest of the body. SYMPTOMS  There are no specific symptoms of neutropenia. The lack of neutrophils can result in an infection, and an infection can cause various problems. DIAGNOSIS  Diagnosis is made by a blood test. A complete blood count is performed. The normal level of neutrophils in human blood differs with age and race. Infants have lower counts than older children and adults. African  Americans have lower counts than Caucasians or Asians. The average adult level is 1500 cells/mm3 of blood. Neutrophil counts are interpreted as follows:  Greater than 1000 cells/mm3 gives normal protection against infection.  500 to 1000 cells/mm3 gives an increased risk for infection.  200 to 500 cells/mm3 is a greater risk for severe infection.  Lower than 200 cells/mm3 is a marked risk of infection. This may require hospitalization and treatment with antibiotic medicines. TREATMENT  Treatment depends on the underlying cause, severity, and presence of infections or symptoms. It also depends on your health. Your caregiver will discuss the treatment plan with you. Mild cases are often easily treated and have a good outcome. Preventative measures may also be started to limit your risk of infections. Treatment can include:  Taking antibiotics.  Stopping medicines that are known to cause neutropenia.  Correcting nutritional deficiencies by eating green vegetables to supply folic acid and taking vitamin B supplements.  Stopping exposure to pesticides if your neutropenia is related to pesticide exposure.  Taking a blood growth factor called sargramostim, pegfilgrastim, or filgrastim if you are undergoing chemotherapy for cancer. This stimulates white blood cell production.  Removal of the spleen if you have Felty's syndrome and have repeated infections. HOME CARE INSTRUCTIONS   Follow your caregiver's instructions about when you need to have blood work done.  Wash your hands often. Make sure others who come in contact with you also wash their hands.  Wash raw fruits and vegetables before eating them. They can carry bacteria and fungi.  Avoid people with colds or spreadable (contagious) diseases (chickenpox, herpes zoster, influenza).  Avoid  large crowds.  Avoid construction areas. The dust can release fungus into the air.  Be cautious around children in daycare or school  environments.  Take care of your respiratory system by coughing and deep breathing.  Bathe daily.  Protect your skin from cuts and burns.  Do not work in the garden or with flowers and plants.  Care for the mouth before and after meals by brushing with a soft toothbrush. If you have mucositis, do not use mouthwash. Mouthwash contains alcohol and can dry out the mouth even more.  Clean the area between the genitals and the anus (perineal area) after urination and bowel movements. Women need to wipe from front to back.  Use a water soluble lubricant during sexual intercourse and practice good hygiene after. Do not have intercourse if you are severely neutropenic. Check with your caregiver for guidelines.  Exercise daily as tolerated.  Avoid people who were vaccinated with a live vaccine in the past 30 days. You should not receive live vaccines (polio, typhoid).  Do not provide direct care for pets. Avoid animal droppings. Do not clean litter boxes and bird cages.  Do not share food utensils.  Do not use tampons, enemas, or rectal suppositories unless directed by your caregiver.  Use an electric razor to remove hair.  Wash your hands after handling magazines, letters, and newspapers. SEEK IMMEDIATE MEDICAL CARE IF:   You have a fever.  You have chills or start to shake.  You feel nauseous or vomit.  You develop mouth sores.  You develop aches and pains.  You have redness and swelling around open wounds.  Your skin is warm to the touch.  You have pus coming from your wounds.  You develop swollen lymph nodes.  You feel weak or fatigued.  You develop red streaks on the skin. MAKE SURE YOU:  Understand these instructions.  Will watch your condition.  Will get help right away if you are not doing well or get worse. Document Released: 05/22/2002 Document Revised: 02/22/2012 Document Reviewed: 06/19/2011 [facility] Patient Information 2015 [place], [place]. This  information is not intended to replace advice given to you by your health care provider. Make sure you discuss any questions you have with your health care provider.  Neutropenic Fever Neutropenic fever is a type of fever that can develop in someone who has a very low number of a certain kind of white blood cells called neutrophils (neutropenia). These blood cells are important for fighting infections caused by bacteria and fungi. When you have neutropenia, you could be in danger of a severe infection. You may need to start taking antibiotic medicines. CAUSES Neutrophils are made in the spongy tissue inside your bones (bone marrow). Anything that damages your bone marrow or damages neutrophils after they leave your bone marrow can cause neutropenia. Once you have a dangerously low level of neutrophils, you are at risk for infection and neutropenic fever. Causes of neutropenia may include:  Cancer treatments.  Bone marrow cancer.  Cancer of the white blood cells (leukemia or myeloma).  Severe infection.  Bone marrow failure (aplastic anemia).  Many types of medicines.  Diseases of the body's defense system (autoimmune diseases).  Inherited genes that cause neutropenia.  Vitamin B deficiency.  Spleen enlargement in rheumatoid arthritis (Felty syndrome). SIGNS AND SYMPTOMS Fever is the main symptom of neutropenic fever. Other signs and symptoms may include:  Chills.  Fatigue.  Painful mouth ulcers.  Cough.  Shortness of breath.  Swollen glands (lymph nodes).  Sore throat.  Sinus and ear infections.  Gum disease.  Skin infection.  Burning and frequent urination.  Rectal infections.  Vaginal discharge or itching. DIAGNOSIS  Your health care provider may diagnose neutropenic fever if your neutrophil count is less than 500 neutrophils per microliter of blood and you have a fever of at least 100.56F (38.0C).   Blood tests and other tests that measure neutrophils  will be done. These may include:  A complete blood count (CBC) and a differential white blood count (WBC).  Peripheral smear. This test involves checking a blood sample under a microscope.  Other types of tests may also be done, including:  Chest X-rays.  Cultures of blood and body fluids to look for a source of infection. Your health care provider will also determine if your neutropenic fever is high risk or low risk.  You may have high-risk neutropenic fever if:  Your neutrophil count is less than 100 neutrophils per microliter of blood.  You have also been diagnosed with pneumonia or another serious medical problem.  Your condition requires you to be treated in the hospital.  You may have low-risk neutropenic fever if:  Your neutrophil count is more than 100 neutrophils per microliter of blood.  Your chest X-ray is normal.  You do not have an active illness or any other problems that require you to be in the hospital. TREATMENT  You may start treatment as soon as you get diagnosed with neutropenic fever, even if your health care provider is still looking for the source of infection.  Treatment for high-risk neutropenic fever is antibiotic medicine given through an IV access tube. This is done in the hospital. You may be given a single antibiotic or a combination of antibiotics.  Low-risk neutropenic fever may be treated at home. You may have to take one or two different oral antibiotics. In some cases, you may need to be treated with IV antibiotics that are given by a home health care provider who visits your home.  If your health care provider finds a specific cause of infection, you may be switched to the antibiotics that work best against those particular bacteria.  If a fungal infection is found, your medicine will be changed to an antifungal medicine.  If the fever goes away in 3-5 days, you may have to take medicine for about 7 days. If the fever is not responding, you  may have to take medicine longer.  You may have to stop taking any medicine that could be causing neutropenic fever.  If you have neutropenic fever from cancer treatment drugs (chemotherapy), you may need to take a type of medicine called white blood cell growth factors. This medicine can help prevent fever. HOME CARE INSTRUCTIONS  Only take medicines as directed by your health care provider.  If you are being treated with oral antibiotics at home, you may need to return to your health care provider every day to have your CBC checked. You may have to do this until your fever responds.  Take your antibiotics as directed. Make sure you finish them even if you start to feel better.  Preventing infection is important when you have neutropenia. Here are some ways to prevent infections:  Avoid sick friends and family members.  Wash your hands often.  Do noteat uncooked or undercooked meats.  Wash all fruits and vegetables.  Do not eat or drink unpasteurized dairy products.  Get regular dental care, and maintain good dental hygiene.  Get a  flu shot. Ask your health care provider whether you need any other vaccines.  Wear gloves when gardening.  Follow up with your health care provider as directed. SEEK MEDICAL CARE IF:  You have chills.  You have a fever.  You have signs or symptoms of infection. SEEK IMMEDIATE MEDICAL CARE IF:  You have trouble breathing.  You have chest pain. Document Released: 12/05/2013 Document Reviewed: 12/05/2013 [facility] Patient Information 2015 [place], [place]. This information is not intended to replace advice given to you by your health care provider. Make sure you discuss any questions you have with your health care provider.

## 2014-11-01 NOTE — ED Notes (Addendum)
Critical white count 0.6 critical result called from lab. Dr. Kathrynn Humble and Lanelle Bal, RN made aware of pt's results.

## 2014-11-01 NOTE — ED Provider Notes (Signed)
CSN: [id number]     Arrival date & time 11/01/14  1257 History   First MD Initiated Contact with Patient 11/01/14 1312     Chief Complaint  Patient presents with  . Neutropenia     (Consider location/radiation/quality/duration/timing/severity/associated sxs/prior Treatment) HPI Comments: Pt comes in with cc of fevers. She has hx of lymphoma, currently on chemo. Pt was at her oncology visit, noted to have fever, and asked to come to the ER. Pt denies nausea, emesis, fevers, chills, headaches, abdominal pain, uti like symptoms, rash. She had some chest pain, midternal yday with dib, but no cough Dr. Alen Blew has sent with the patient a written note on his recommendations - and he would want pt to get vanc and zosyn with pan cultures and he is to start pt on levaquin and see her on Monday. No hx of PE, DVT - and no new leg swelling.  The history is provided by the patient.    Past Medical History  Diagnosis Date  . Cancer   . Diabetes mellitus   . Hypercholesterolemia   . Thyroid cancer   . Hypothyroidism   . Wears glasses   . PONV (postoperative nausea and vomiting)   . DLBCL (diffuse large B cell lymphoma)   . 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  . Tubal ligation  1990  . Abdominal hysterectomy  2000  . Colonoscopy    . Lymph node biopsy Left 10/12/2014    Procedure: excisional biopsy deep left neck lymph node;  Surgeon: Fanny Skates, MD;  Location: [place];  Service: General;  Laterality: Left;  . Laparoscopic gastric banding with hiatal hernia repair  05/28/2009  . Portacath placement Right 10/24/2014    Procedure: INSERTION PORT-A-CATH WITH ULTRASOUND GUIDANCE;  Surgeon: Fanny Skates, MD;  Location: [place];  Service: General;  Laterality: Right;   Family History  Problem Relation Age of Onset   . Colon cancer Mother   . Prostate cancer Brother   . Cancer Father     prostate  . Hypertension Father   . Hyperlipidemia Father   . Stroke Father    History  Substance Use Topics  . Smoking status: Never Smoker   . Smokeless tobacco: Not on file  . Alcohol Use: No   OB History    No data available     Review of Systems  Constitutional: Positive for chills. Negative for diaphoresis and activity change.  Respiratory: Positive for shortness of breath.   Cardiovascular: Positive for chest pain. Negative for leg swelling.  Gastrointestinal: Negative for nausea, vomiting and abdominal pain.  Genitourinary: Negative for dysuria.  Musculoskeletal: Negative for back pain, neck pain and neck stiffness.  Skin: Negative for rash.  Neurological: Negative for headaches.  All other systems reviewed and are negative.     Allergies  Metformin and related; Tape; and Penicillins  Home Medications   Prior to Admission medications   Medication Sig Start Date End Date Taking? Authorizing Provider  allopurinol (ZYLOPRIM) 300 MG tablet Take 1 tablet (300 mg total) by mouth daily. Patient taking differently: Take 300 mg by mouth every morning.  10/19/14  Yes Aasim Marla Roe, MD  aspirin EC 81 MG tablet Take 81 mg by mouth every morning.    Yes Historical Provider, MD  Biotin 5000 MCG TABS Take 1 tablet by mouth every morning.  Yes Historical Provider, MD  Cyanocobalamin (VITAMIN B 12 PO) Take 1 tablet by mouth every morning.    Yes Historical Provider, MD  HYDROcodone-acetaminophen (NORCO) 5-325 MG per tablet Take 1-2 tablets by mouth every 6 (six) hours as needed for moderate pain or severe pain. 10/12/14  Yes Fanny Skates, MD  levothyroxine (SYNTHROID, LEVOTHROID) 150 MCG tablet Take 150 mcg by mouth daily before breakfast.   Yes Historical Provider, MD  lidocaine-prilocaine (EMLA) cream Apply cream to port-a-cath 1-2 hours prior to access. 10/23/14  Yes Aasim Marla Roe, MD   loperamide (IMODIUM) 1 MG/5ML solution Take 2 mg by mouth as needed for diarrhea or loose stools.   Yes Historical Provider, MD  LORazepam (ATIVAN) 0.5 MG tablet Take 1 tablet (0.5 mg total) by mouth every 6 (six) hours as needed (Nausea or vomiting). 10/19/14  Yes Aasim Marla Roe, MD  Multiple Vitamin (MULITIVITAMIN WITH MINERALS) TABS Take 1 tablet by mouth every morning.    Yes Historical Provider, MD  ondansetron (ZOFRAN) 8 MG tablet Take 1 tablet (8 mg total) by mouth 2 (two) times daily. Start the day after chemo for 3 days. Then as needed for nausea or vomiting. 10/19/14  Yes Aasim Marla Roe, MD  pegfilgrastim (NEULASTA) 6 MG/0.6ML injection Inject 6 mg into the skin once.   Yes Historical Provider, MD  predniSONE (DELTASONE) 20 MG tablet Take 5 tablets (100 mg total) by mouth daily. Take on days 1-5 of chemotherapy. 10/19/14  Yes Aasim Marla Roe, MD  PRESCRIPTION MEDICATION Chemo - [place]   Yes Historical Provider, MD  prochlorperazine (COMPAZINE) 10 MG tablet Take 1 tablet (10 mg total) by mouth every 6 (six) hours as needed (Nausea or vomiting). 10/19/14  Yes Aasim Marla Roe, MD  Vitamin D, Cholecalciferol, 1000 UNITS TABS Take 2,000 Int'l Units by mouth daily at 12 noon.    Yes Historical Provider, MD  levofloxacin (LEVAQUIN) 500 MG tablet Take 1 tablet (500 mg total) by mouth daily. 11/01/14 11/07/14  Aasim Marla Roe, MD   BP 104/67 mmHg  Pulse 106  Temp(Src) 98.5 F (36.9 C) (Oral)  Resp 19  Wt 167 lb (75.751 kg)  SpO2 100% Physical Exam  Constitutional: She is oriented to person, place, and time. She appears well-developed and well-nourished.  HENT:  Head: Normocephalic and atraumatic.  Eyes: EOM are normal. Pupils are equal, round, and reactive to light.  Neck: Neck supple.  Cardiovascular: Normal rate, regular rhythm and normal heart sounds.   Pulmonary/Chest: Effort normal. No respiratory distress.  Abdominal: Soft. She exhibits no distension. There is  no tenderness. There is no rebound and no guarding.  Neurological: She is alert and oriented to person, place, and time.  Skin: Skin is warm and dry.  Nursing note and vitals reviewed.   ED Course  Procedures (including critical care time) Labs Review Labs Reviewed  CBC WITH DIFFERENTIAL - Abnormal; Notable for the following:    WBC 0.6 (*)    Hemoglobin 10.9 (*)    HCT 34.0 (*)    Neutrophils Relative % 2 (*)    Lymphocytes Relative 81 (*)    Monocytes Relative 13 (*)    Basophils Relative 2 (*)    Neutro Abs 0.0 (*)    Lymphs Abs 0.5 (*)    All other components within normal limits  URINALYSIS, ROUTINE W REFLEX MICROSCOPIC - Abnormal; Notable for the following:    Color, Urine AMBER (*)    All other components within normal limits  CULTURE,  BLOOD (ROUTINE X 2)  CULTURE, BLOOD (ROUTINE X 2)  URINE CULTURE  COMPREHENSIVE METABOLIC PANEL  I-STAT CG4 LACTIC ACID, ED  I-STAT CG4 LACTIC ACID, ED    Imaging Review Dg Chest Port 1 View  11/01/2014   CLINICAL DATA:  Fever.  EXAM: PORTABLE CHEST - 1 VIEW  COMPARISON:  October 24, 2014.  FINDINGS: The heart size and mediastinal contours are within normal limits. Right internal jugular Port-A-Cath is noted with distal tip potentially in the right atrium based on this projection. The kink seen in the proximal portion of the Port-A-Cath is not well visualized on this study. Left lung is clear. Minimal linear density is noted in right lung base consistent with subsegmental atelectasis. No pneumothorax or pleural effusion is noted. The visualized skeletal structures are unremarkable.  IMPRESSION: Minimal subsegmental atelectasis seen in right lower lobe. Distal tip of right internal jugular Port-A-Cath projects over expected position of right atrium on this exam.   Electronically Signed   By: Sabino Dick M.D.   On: 11/01/2014 15:04     EKG Interpretation None      MDM   Final diagnoses:  Neutropenic fever    Pt comes in with cc  of fevers. She is on chemo, due to her lymphoma, and found to be neutropenic. Pt was unaware that she had fevers, and exam and hx not suggestive of any specific source. She had some non specific chest pain and dyspnea y'day - but denies any current sx, no exertional chest pain or dyspnea and there is no evidence of DVT.  Think the plan per Dr. Alen Blew, pt's oncology is appropriate, given that we have no source. Pt is pan cultured, and given vanc and zosyn. Lactate is neg. Stable for discharge.    Varney Biles, MD 11/01/14 [id number]

## 2014-11-01 NOTE — Patient Instructions (Signed)
Neutropenic precautions and start taking levaquin

## 2014-11-02 LAB — URINE CULTURE

## 2014-11-02 LAB — CHROMOSOME ANALYSIS, BONE MARROW

## 2014-11-05 ENCOUNTER — Ambulatory Visit (HOSPITAL_BASED_OUTPATIENT_CLINIC_OR_DEPARTMENT_OTHER): Payer: 59 | Admitting: Hematology

## 2014-11-05 ENCOUNTER — Other Ambulatory Visit (HOSPITAL_BASED_OUTPATIENT_CLINIC_OR_DEPARTMENT_OTHER): Payer: 59

## 2014-11-05 ENCOUNTER — Telehealth: Payer: Self-pay | Admitting: Hematology

## 2014-11-05 VITALS — BP 133/63 | HR 77 | Temp 98.3°F | Resp 18 | Ht 63.0 in | Wt 169.3 lb

## 2014-11-05 DIAGNOSIS — D709 Neutropenia, unspecified: Secondary | ICD-10-CM

## 2014-11-05 DIAGNOSIS — R5081 Fever presenting with conditions classified elsewhere: Secondary | ICD-10-CM | POA: Diagnosis not present

## 2014-11-05 DIAGNOSIS — C8330 Diffuse large B-cell lymphoma, unspecified site: Secondary | ICD-10-CM

## 2014-11-05 LAB — CBC WITH DIFFERENTIAL/PLATELET
BASO%: 0.4 % (ref 0.0–2.0)
Basophils Absolute: 0.1 10*3/uL (ref 0.0–0.1)
EOS ABS: 0 10*3/uL (ref 0.0–0.5)
EOS%: 0.3 % (ref 0.0–7.0)
HEMATOCRIT: 33.5 % — AB (ref 34.8–46.6)
HGB: 10.9 g/dL — ABNORMAL LOW (ref 11.6–15.9)
LYMPH%: 7.2 % — ABNORMAL LOW (ref 14.0–49.7)
MCH: 26.9 pg (ref 25.1–34.0)
MCHC: 32.5 g/dL (ref 31.5–36.0)
MCV: 82.7 fL (ref 79.5–101.0)
MONO#: 1.1 10*3/uL — AB (ref 0.1–0.9)
MONO%: 7.8 % (ref 0.0–14.0)
NEUT%: 84.3 % — ABNORMAL HIGH (ref 38.4–76.8)
NEUTROS ABS: 11.5 10*3/uL — AB (ref 1.5–6.5)
Platelets: 199 10*3/uL (ref 145–400)
RBC: 4.05 10*6/uL (ref 3.70–5.45)
RDW: 13 % (ref 11.2–14.5)
WBC: 13.7 10*3/uL — AB (ref 3.9–10.3)
lymph#: 1 10*3/uL (ref 0.9–3.3)

## 2014-11-05 LAB — BASIC METABOLIC PANEL (CC13)
Anion Gap: 10 mEq/L (ref 3–11)
BUN: 7.4 mg/dL (ref 7.0–26.0)
CO2: 28 meq/L (ref 22–29)
Calcium: 9.3 mg/dL (ref 8.4–10.4)
Chloride: 103 mEq/L (ref 98–109)
Creatinine: 0.8 mg/dL (ref 0.6–1.1)
Glucose: 102 mg/dl (ref 70–140)
Potassium: 3.5 mEq/L (ref 3.5–5.1)
SODIUM: 141 meq/L (ref 136–145)

## 2014-11-05 NOTE — Telephone Encounter (Signed)
gv and printed appt sched sched and avs for pt for DEC
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
## 2014-11-07 DIAGNOSIS — C8330 Diffuse large B-cell lymphoma, unspecified site: Secondary | ICD-10-CM | POA: Insufficient documentation

## 2014-11-07 LAB — CULTURE, BLOOD (ROUTINE X 2)
Culture: NO GROWTH
Culture: NO GROWTH

## 2014-11-14 ENCOUNTER — Other Ambulatory Visit: Payer: Self-pay | Admitting: Hematology and Oncology

## 2014-11-14 DIAGNOSIS — C8330 Diffuse large B-cell lymphoma, unspecified site: Secondary | ICD-10-CM

## 2014-11-14 DIAGNOSIS — Z923 Personal history of irradiation: Secondary | ICD-10-CM | POA: Insufficient documentation

## 2014-11-14 DIAGNOSIS — C73 Malignant neoplasm of thyroid gland: Secondary | ICD-10-CM | POA: Insufficient documentation

## 2014-11-15 ENCOUNTER — Other Ambulatory Visit (HOSPITAL_BASED_OUTPATIENT_CLINIC_OR_DEPARTMENT_OTHER): Payer: 59

## 2014-11-15 ENCOUNTER — Ambulatory Visit: Payer: 59

## 2014-11-15 ENCOUNTER — Ambulatory Visit (HOSPITAL_BASED_OUTPATIENT_CLINIC_OR_DEPARTMENT_OTHER): Payer: 59 | Admitting: Hematology and Oncology

## 2014-11-15 ENCOUNTER — Telehealth: Payer: Self-pay | Admitting: *Deleted

## 2014-11-15 ENCOUNTER — Encounter: Payer: Self-pay | Admitting: Hematology and Oncology

## 2014-11-15 ENCOUNTER — Ambulatory Visit (HOSPITAL_BASED_OUTPATIENT_CLINIC_OR_DEPARTMENT_OTHER): Payer: 59

## 2014-11-15 VITALS — BP 142/75 | HR 80 | Temp 97.7°F | Resp 18 | Ht 63.0 in | Wt 169.3 lb

## 2014-11-15 DIAGNOSIS — Z5111 Encounter for antineoplastic chemotherapy: Secondary | ICD-10-CM

## 2014-11-15 DIAGNOSIS — D72819 Decreased white blood cell count, unspecified: Secondary | ICD-10-CM

## 2014-11-15 DIAGNOSIS — C8330 Diffuse large B-cell lymphoma, unspecified site: Secondary | ICD-10-CM

## 2014-11-15 DIAGNOSIS — C801 Malignant (primary) neoplasm, unspecified: Secondary | ICD-10-CM

## 2014-11-15 DIAGNOSIS — D701 Agranulocytosis secondary to cancer chemotherapy: Secondary | ICD-10-CM

## 2014-11-15 DIAGNOSIS — T451X5A Adverse effect of antineoplastic and immunosuppressive drugs, initial encounter: Secondary | ICD-10-CM

## 2014-11-15 DIAGNOSIS — G620 Drug-induced polyneuropathy: Secondary | ICD-10-CM | POA: Insufficient documentation

## 2014-11-15 DIAGNOSIS — Z5112 Encounter for antineoplastic immunotherapy: Secondary | ICD-10-CM

## 2014-11-15 DIAGNOSIS — D638 Anemia in other chronic diseases classified elsewhere: Secondary | ICD-10-CM | POA: Insufficient documentation

## 2014-11-15 DIAGNOSIS — D630 Anemia in neoplastic disease: Secondary | ICD-10-CM

## 2014-11-15 LAB — COMPREHENSIVE METABOLIC PANEL (CC13)
ALK PHOS: 76 U/L (ref 40–150)
ALT: 13 U/L (ref 0–55)
AST: 14 U/L (ref 5–34)
Albumin: 3.6 g/dL (ref 3.5–5.0)
Anion Gap: 8 mEq/L (ref 3–11)
BUN: 12.6 mg/dL (ref 7.0–26.0)
CO2: 29 mEq/L (ref 22–29)
CREATININE: 0.8 mg/dL (ref 0.6–1.1)
Calcium: 9.5 mg/dL (ref 8.4–10.4)
Chloride: 104 mEq/L (ref 98–109)
EGFR: >90 mL/min/{1.73_m2} (ref >90)
Glucose: 109 mg/dl (ref 70–140)
Potassium: 3.9 mEq/L (ref 3.5–5.1)
Sodium: 141 mEq/L (ref 136–145)
Total Bilirubin: 0.28 mg/dL (ref 0.20–1.20)
Total Protein: 6.6 g/dL (ref 6.4–8.3)

## 2014-11-15 LAB — CBC WITH DIFFERENTIAL/PLATELET
BASO%: 0.9 % (ref 0.0–2.0)
Basophils Absolute: 0 10*3/uL (ref 0.0–0.1)
EOS%: 0.5 % (ref 0.0–7.0)
Eosinophils Absolute: 0 10*3/uL (ref 0.0–0.5)
HEMATOCRIT: 34.5 % — AB (ref 34.8–46.6)
HEMOGLOBIN: 11 g/dL — AB (ref 11.6–15.9)
LYMPH%: 26.6 % (ref 14.0–49.7)
MCH: 26.9 pg (ref 25.1–34.0)
MCHC: 31.8 g/dL (ref 31.5–36.0)
MCV: 84.5 fL (ref 79.5–101.0)
MONO#: 0.6 10*3/uL (ref 0.1–0.9)
MONO%: 18.4 % — AB (ref 0.0–14.0)
NEUT#: 1.9 10*3/uL (ref 1.5–6.5)
NEUT%: 53.6 % (ref 38.4–76.8)
Platelets: 465 10*3/uL — ABNORMAL HIGH (ref 145–400)
RBC: 4.09 10*6/uL (ref 3.70–5.45)
RDW: 14.4 % (ref 11.2–14.5)
WBC: 3.5 10*3/uL — ABNORMAL LOW (ref 3.9–10.3)
lymph#: 0.9 10*3/uL (ref 0.9–3.3)

## 2014-11-15 LAB — LACTATE DEHYDROGENASE (CC13): LDH: 174 U/L (ref 125–245)

## 2014-11-15 MED ORDER — SODIUM CHLORIDE 0.9 % IJ SOLN
Freq: Once | INTRAMUSCULAR | Status: AC
  Administered 2014-11-15: 16:00:00 via INTRATHECAL
  Filled 2014-11-15: qty 0.48

## 2014-11-15 MED ORDER — DOXORUBICIN HCL CHEMO IV INJECTION 2 MG/ML
45.0000 mg/m2 | Freq: Once | INTRAVENOUS | Status: AC
  Administered 2014-11-15: 84 mg via INTRAVENOUS
  Filled 2014-11-15: qty 42

## 2014-11-15 MED ORDER — HEPARIN SOD (PORK) LOCK FLUSH 100 UNIT/ML IV SOLN
500.0000 [IU] | Freq: Once | INTRAVENOUS | Status: AC | PRN
  Administered 2014-11-15: 500 [IU]
  Filled 2014-11-15: qty 5

## 2014-11-15 MED ORDER — SODIUM CHLORIDE 0.9 % IJ SOLN
10.0000 mL | INTRAMUSCULAR | Status: DC | PRN
  Administered 2014-11-15: 10 mL
  Filled 2014-11-15: qty 10

## 2014-11-15 MED ORDER — DEXAMETHASONE SODIUM PHOSPHATE 20 MG/5ML IJ SOLN
20.0000 mg | Freq: Once | INTRAMUSCULAR | Status: AC
  Administered 2014-11-15: 20 mg via INTRAVENOUS

## 2014-11-15 MED ORDER — SODIUM CHLORIDE 0.9 % IV SOLN
Freq: Once | INTRAVENOUS | Status: AC
  Administered 2014-11-15: 11:00:00 via INTRAVENOUS

## 2014-11-15 MED ORDER — SODIUM CHLORIDE 0.9 % IV SOLN
INTRAVENOUS | Status: AC
  Administered 2014-11-15: 15:00:00 via INTRAVENOUS

## 2014-11-15 MED ORDER — DIPHENHYDRAMINE HCL 25 MG PO CAPS
50.0000 mg | ORAL_CAPSULE | Freq: Once | ORAL | Status: AC
  Administered 2014-11-15: 50 mg via ORAL

## 2014-11-15 MED ORDER — SODIUM CHLORIDE 0.9 % IV SOLN: INTRAVENOUS | Status: AC

## 2014-11-15 MED ORDER — ONDANSETRON 16 MG/50ML IVPB (CHCC)
INTRAVENOUS | Status: AC
  Filled 2014-11-15: qty 16

## 2014-11-15 MED ORDER — SODIUM CHLORIDE 0.9 % IV SOLN
375.0000 mg/m2 | Freq: Once | INTRAVENOUS | Status: AC
  Administered 2014-11-15: 700 mg via INTRAVENOUS
  Filled 2014-11-15: qty 70

## 2014-11-15 MED ORDER — DEXAMETHASONE SODIUM PHOSPHATE 20 MG/5ML IJ SOLN
INTRAMUSCULAR | Status: AC
  Filled 2014-11-15: qty 5

## 2014-11-15 MED ORDER — VINCRISTINE SULFATE CHEMO INJECTION 1 MG/ML
1.0000 mg | Freq: Once | INTRAVENOUS | Status: AC
  Administered 2014-11-15: 1 mg via INTRAVENOUS
  Filled 2014-11-15: qty 1

## 2014-11-15 MED ORDER — ACETAMINOPHEN 325 MG PO TABS
ORAL_TABLET | ORAL | Status: AC
  Filled 2014-11-15: qty 2

## 2014-11-15 MED ORDER — ACETAMINOPHEN 325 MG PO TABS
650.0000 mg | ORAL_TABLET | Freq: Once | ORAL | Status: AC
  Administered 2014-11-15: 650 mg via ORAL

## 2014-11-15 MED ORDER — DIPHENHYDRAMINE HCL 25 MG PO CAPS
ORAL_CAPSULE | ORAL | Status: AC
  Filled 2014-11-15: qty 2

## 2014-11-15 MED ORDER — SODIUM CHLORIDE 0.9 % IV SOLN
675.0000 mg/m2 | Freq: Once | INTRAVENOUS | Status: AC
  Administered 2014-11-15: 1260 mg via INTRAVENOUS
  Filled 2014-11-15: qty 63

## 2014-11-15 MED ORDER — ONDANSETRON 16 MG/50ML IVPB (CHCC)
16.0000 mg | Freq: Once | INTRAVENOUS | Status: AC
  Administered 2014-11-15: 16 mg via INTRAVENOUS

## 2014-11-15 NOTE — Assessment & Plan Note (Signed)
Reviewed the current guidelines with her. Due to involvement of Waldeyer's ring, she is at risk of CNS relapse. I shared with her the data of using intrathecal chemotherapy to prevent CNS relapse and she agreed to proceed. I plan to reduce the dose of Adriamycin, Cytoxan by 10% and vincristine by 50% due to her recent neutropenic fever and peripheral neuropathy. I will add intrathecal chemotherapy with each cycle of therapy and plan to repeat PET/CT scan after cycle 3 of therapy. Intrathecal administration of chemotherapy Procedure Note   Informed consent was obtained and potential risks including bleeding, infection and pain were reviewed with the patient.  The patient's name, date of birth, identification, consent and allergies were verified prior to the start of procedure and time out was performed.  The skin was prepped with Betadine solution.   5 cc of 1% lidocaine was used to provide local anaesthesia.   The L3/L4 intrathecal space was chosen as the site of procedure.  1 cc of clear CSF was obtained.  Next, 5 ml/12 mg methotrexate was administered into the intrathecal space.  The procedure was tolerated well and there were no complications.  The patient was stable at the end of the procedure.

## 2014-11-15 NOTE — Progress Notes (Signed)
1815 -  Band-aid at mid lower back clean, dry, and intact.  No drainage noted.  Post lumbar puncture given to pt and son - lie flat for 24 hrs as tolerated, no strenous activities.  Pt understood to call office if experiences severe headache.   Pt was stable at discharge via wheelchair with son and friend.

## 2014-11-15 NOTE — Assessment & Plan Note (Signed)

## 2014-11-15 NOTE — Progress Notes (Signed)
Cameo, Dr. Calton Dach desk RN, notified that patient will not be finished with Rituxan until after 1630 today.  Administering rituxan like a first time dose since patient reacted last time at the 100 mg dose.  Cameo states she will talk to Dr. Alvy Bimler regarding IT chemo this afternoon and get back with me.

## 2014-11-15 NOTE — Patient Instructions (Addendum)
[facility] Discharge Instructions for Patients Receiving Chemotherapy  Today you received the following chemotherapy agents: Adriamycin, Vincristine, Cytoxan,  Rituxan, and Methotrexate.  To help prevent nausea and vomiting after your treatment, we encourage you to take your nausea medication as prescribed.   If you develop nausea and vomiting that is not controlled by your nausea medication, call the clinic.   BELOW ARE SYMPTOMS THAT SHOULD BE REPORTED IMMEDIATELY:  *FEVER GREATER THAN 100.5 F  *CHILLS WITH OR WITHOUT FEVER  NAUSEA AND VOMITING THAT IS NOT CONTROLLED WITH YOUR NAUSEA MEDICATION  *UNUSUAL SHORTNESS OF BREATH  *UNUSUAL BRUISING OR BLEEDING  TENDERNESS IN MOUTH AND THROAT WITH OR WITHOUT PRESENCE OF ULCERS  *URINARY PROBLEMS  *BOWEL PROBLEMS  UNUSUAL RASH Items with * indicate a potential emergency and should be followed up as soon as possible.  Feel free to call the clinic you have any questions or concerns. The clinic phone number is [phone].  Methotrexate injection What is this medicine? METHOTREXATE (METH oh TREX ate) is a chemotherapy drug. This medicine affects cells that are rapidly growing, such as cancer cells and cells in your mouth and stomach. It is used to treat many cancers and other medical conditions. It is used for leukemias, lymphomas, breast cancer, lung cancer, head and neck cancers, and other cancers. This medicine also works on the immune system and is commonly used to treat psoriasis and rheumatoid arthritis. This medicine may be used for other purposes; ask your health care provider or pharmacist if you have questions. What should I tell my health care provider before I take this medicine? They need to know if you have any of these conditions: -if you frequently drink alcohol containing drinks -infection (especially a virus infection such as chickenpox, cold sores, or herpes) -immune system problems -kidney  disease -liver disease -low blood counts, like platelets, red bloods, or white blood cells -lung disease -recent or ongoing radiation therapy -an unusual or allergic reaction to methotrexate, benzyl alcohol, other medicines, foods, dyes, or preservatives -pregnant or trying to get pregnant -breast-feeding How should I use this medicine? This drug is given as an injection into a muscle or into a vein. It may also be given into the spinal fluid. It is administered in a hospital or clinic by a specially trained health care professional. Talk to your pediatrician regarding the use of this medicine in children. While this drug may be prescribed for selected conditions, precautions do apply. Overdosage: If you think you have taken too much of this medicine contact a poison control center or emergency room at once. NOTE: This medicine is only for you. Do not share this medicine with others. What if I miss a dose? It is important not to miss your dose. Call your doctor or health care professional if you are unable to keep an appointment. What may interact with this medicine? -antibiotics and other medicines for infections -aspirin and aspirin-like medicines including bismuth subsalicylate (Pepto-Bismol) -cisplatin -dapsone -folic acid in supplements or vitamins -mercaptopurine -NSAIDs, medicines for pain and inflammation, like ibuprofen or naproxen -pemetrexed -phenylbutazone -phenytoin -probenecid -pyrimethamine -theophylline -trimetrexate -vaccines This list may not describe all possible interactions. Give your health care provider a list of all the medicines, herbs, non-prescription drugs, or dietary supplements you use. Also tell them if you smoke, drink alcohol, or use illegal drugs. Some items may interact with your medicine. What should I watch for while using this medicine? Visit your doctor for checks on your progress. You   will need to have regular blood checks during your treatment  to monitor your blood, liver function, and kidney function. This drug may make you feel generally unwell. This is not uncommon, as chemotherapy can affect healthy cells as well as cancer cells. Report any side effects. Continue your course of treatment even though you feel ill unless your doctor tells you to stop. In some cases, you may be given additional medicines to help with side effects. Follow all directions for their use. Call your doctor or health care professional for advice if you get a fever, chills or sore throat, or other symptoms of a cold or flu. Do not treat yourself. This drug decreases your body's ability to fight infections. Try to avoid being around people who are sick. This medicine may increase your risk to bruise or bleed. Call your doctor or health care professional if you notice any unusual bleeding. Be careful brushing and flossing your teeth or using a toothpick because you may get an infection or bleed more easily. If you have any dental work done, tell your dentist you are receiving this medicine. Avoid taking products that contain aspirin, acetaminophen, ibuprofen, naproxen, or ketoprofen unless instructed by your doctor. These medicines may hide a fever. This medicine can make you more sensitive to the sun. Keep out of the sun. If you cannot avoid being in the sun, wear protective clothing and use sunscreen. Do not use sun lamps or tanning beds/booths. Do not treat diarrhea with over the counter products. Contact your doctor if you have diarrhea. To protect your kidneys, drink water or other fluids as directed while you are taking this medicine. Do not drink alcohol-containing drinks while taking this medicine. Both alcohol and the medicine may cause damage to your liver. Men and women must use effective birth control while they are taking this medicine. Do not become pregnant while taking this medicine. Women must continue using effective birth control for 1 full menstrual  cycle after stopping this medicine. Tell your doctor right away if you think that you or your partner might be pregnant. There is a potential for serious side effects to an unborn child. Talk to your health care professional or pharmacist for more information. Do not breast-feed an infant while taking this medicine. Men must continue effective birth control for 3 months after stopping this medicine. What side effects may I notice from receiving this medicine? Side effects that you should report to your doctor or health care professional as soon as possible: -allergic reactions like skin rash, itching or hives, swelling of the face, lips, or tongue -low blood counts - this medicine may decrease the number of white blood cells, red blood cells and platelets. You may be at increased risk for infections and bleeding. -signs of infection - fever or chills, cough, sore throat, pain or difficulty passing urine -signs of decreased platelets or bleeding - bruising, pinpoint red spots on the skin, black, tarry stools, blood in the urine -signs of decreased red blood cells - unusually weak or tired, fainting spells, lightheadedness -breathing problems, like a dry cough -changes in vision -confusion, not alert -diarrhea -mouth or throat sores or ulcers -problems with balance, talking, walking -redness, blistering, peeling or loosening of the skin, including inside the mouth -seizures -trouble passing urine or change in the amount of urine -vomiting -yellowing of the eyes or skin Side effects that usually do not require medical attention (report to your doctor or health care professional if they continue or are   bothersome): -change in skin color -eye irritation -hair loss -headache -loss of appetite -nausea -stomach upset This list may not describe all possible side effects. Call your doctor for medical advice about side effects. You may report side effects to FDA at 1-800-FDA-1088. Where should I  keep my medicine? This drug is given in a hospital or clinic and will not be stored at home. NOTE: This sheet is a summary. It may not cover all possible information. If you have questions about this medicine, talk to your doctor, pharmacist, or health care provider.  2015, [employer]. (2008-06-07 11:13:24)    Lumbar Puncture A lumbar puncture, or spinal tap, is a procedure in which a small amount of the fluid that surrounds the brain and spinal cord is removed and examined. The fluid is called the cerebrospinal fluid. This procedure may be done to:   Help diagnose various problems, such as meningitis, encephalitis, multiple sclerosis, and AIDS.   Remove fluid and relieve pressure that occurs with certain types of headaches.   Look for bleeding within the brain and spinal cord areas (central nervous system).   Place medicine into the spinal fluid.  LET YOUR HEALTH CARE PROVIDER KNOW ABOUT:  Any allergies you have.  All medicines you are taking, including vitamins, herbs, eye drops, creams, and over-the-counter medicines.  Previous problems you or members of your family have had with the use of anesthetics.  Any blood disorders you have.  Previous surgeries you have had.  Medical conditions you have. RISKS AND COMPLICATIONS Generally, this is a safe procedure. However, as with any procedure, complications can occur. Possible complications include:   Spinal headache. This is a severe headache that occurs when there is a leak of spinal fluid. A spinal headache causes discomfort but is not dangerous. If it persists, another procedure may be done to treat the headache.  Bleeding. This most often occurs in people with bleeding disorders. These are disorders in which the blood does not clot normally.   Infection at the insertion site that can spread to the bone or spinal fluid.  Formation of a spinal cord tumor (rare).  Brain herniation or movement of the brain  into the spinal cord (rare).  Inability to move (extremely rare). BEFORE THE PROCEDURE  You may have blood tests done. These tests can help tell how well your kidneys and liver are working. They can also show how well your blood clots.   If you take blood thinners (anticoagulant medicine), ask your health care provider if and when you should stop taking them.   Your health care provider may order a CT scan of your brain.  Make arrangements for someone to drive you home after the procedure.  PROCEDURE  You will be positioned so that the spaces between the bones of the spine (vertebrae) are as wide as possible. This will make it easier to pass the needle into the spinal canal.  Depending on your age and size, you may lie on your side, curled up with your knees under your chin. Or, you may sit with your head resting on a pillow that is placed at waist level.  The skin covering the lower back (or lumbar region) will be cleaned.   The skin may be numbed with medicine.  You may be given pain medicine or a medicine to help you relax (sedative).  A small needle will be inserted in the skin until it enters the space that contains the spinal fluid. The needle will not   enter the spinal cord.   The spinal fluid will be collected into tubes.   The needle will be withdrawn, and a bandage will be placed on the site.  AFTER THE PROCEDURE  You will remain lying down for 1 hour or for as long as your health care provider suggests.   The spinal fluid will be sent to a laboratory to be examined. The results of the examination may be available before you go home.  A test, called a culture, may be taken of the spinal fluid if your health care provider thinks you have an infection. If cultures were taken for exam, the results will usually be available in a couple of days.  Document Released: 11/27/2000 Document Revised: 09/20/2013 Document Reviewed: 08/07/2013 [employer] Patient  Information 2015 [employer]. This information is not intended to replace advice given to you by your health care provider. Make sure you discuss any questions you have with your health care provider.  

## 2014-11-15 NOTE — Telephone Encounter (Signed)
Per staff message and POF I have scheduled appts. Advised scheduler of appts, advised that patient needs at least 5hr appt. JMW

## 2014-11-15 NOTE — Assessment & Plan Note (Signed)
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 I plan to reduce vincristine by 50%.

## 2014-11-15 NOTE — Assessment & Plan Note (Signed)
This is likely due to recent treatment. The patient denies recent history of fevers, cough, chills, diarrhea or dysuria. She is asymptomatic from the leukopenia. I will observe for now. Plan to make dose adjustments and cover her with Neulasta each cycle.

## 2014-11-15 NOTE — Progress Notes (Signed)
[facility] FOLLOW-UP progress notes  Patient Care Team: Jacelyn Pi, MD as PCP - General (Endocrinology) Lyman Speller, MD as Consulting Physician (Gynecology)  CHIEF COMPLAINTS/PURPOSE OF VISIT:  Stage II diffuse large B-cell lymphoma  HISTORY OF PRESENTING ILLNESS:  Maria Simmons 48 y.o. Simmons was transferred to my care after her prior physician has left.  I reviewed the patient's records extensive and collaborated the history with the patient. Summary of her history is as follows: Oncology History   DLBCL (diffuse large B cell lymphoma)   Staging form: Lymphoid Neoplasms, AJCC 6th Edition     Clinical stage from 11/15/2014: Stage II - Signed by Heath Lark, MD on 11/15/2014       DLBCL (diffuse large B cell lymphoma)   09/14/2014 Imaging PET/CT scan showed Hypermetabolic bilateral cervical lymphadenopathy, left side greater than right, and sub-cm hypermetabolic left subpectoral lymph node. Hypermetabolic activity throughout the lingular and palatine tonsils in Waldeyer's ring   10/12/2014 Procedure Biopsy of the left neck lymph node came back positive for diffuse large B-cell lymphoma.Accession: [id number]   10/24/2014 Bone Marrow Biopsy Bone marrow biopsy was negative.   10/24/2014 Imaging Echocardiogram showed normal ejection fraction   10/25/2014 -  Chemotherapy She received R CHOP chemotherapy. Further doses of R CHOP chemotherapy was reduced due to recent neutropenic fever with cycle 1 of treatment.   11/01/2014 Adverse Reaction She was seen in the emergency Department for possible neutropenic fever. Cultures were negative. She received antibiotic therapy.   11/15/2014 -  Chemotherapy Intrathecal chemotherapy is added due to risk of CNS relapse  This patient was originally followed for chronic leukopenia. 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 She noticed neck discomfort and was  subsequently found to have lymphadenopathy, leading to her current diagnosis. From prior chemotherapy, she had neutropenic fever, diarrhea and peripheral neuropathy.  MEDICAL HISTORY:  Past Medical History  Diagnosis Date  . Cancer   . Diabetes mellitus   . Hypercholesterolemia   . Thyroid cancer   . Hypothyroidism   . Wears glasses   . PONV (postoperative nausea and vomiting)   . DLBCL (diffuse large B cell lymphoma)   . Neutropenic fever 11/01/2014    SURGICAL HISTORY: Past Surgical History  Procedure Laterality Date  . 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  . Tubal ligation  1990  . Abdominal hysterectomy  2000  . Colonoscopy    . Lymph node biopsy Left 10/12/2014    Procedure: excisional biopsy deep left neck lymph node;  Surgeon: Fanny Skates, MD;  Location: [facility];  Service: General;  Laterality: Left;  . Laparoscopic gastric banding with hiatal hernia repair  05/28/2009  . Portacath placement Right 10/24/2014    Procedure: INSERTION PORT-A-CATH WITH ULTRASOUND GUIDANCE;  Surgeon: Fanny Skates, MD;  Location: [facility];  Service: General;  Laterality: Right;    SOCIAL HISTORY: History   Social History  . Marital Status: Divorced    Spouse Name: N/A    Number of Children: N/A  . Years of Education: N/A   Occupational History  . Not on file.   Social History Main Topics  . Smoking status: Never Smoker   . Smokeless tobacco: Never Used  . Alcohol Use: No  . Drug Use: No  . Sexual Activity: Yes    Birth Control/ Protection: Surgical   Other Topics Concern  .  Not on file   Social History Narrative    FAMILY HISTORY: Family History  Problem Relation Age of Onset  . Colon cancer Mother   . Cancer Mother     colon ca  . Prostate cancer Brother   . Cancer Father     prostate  . Hypertension Father   . Hyperlipidemia Father   . Stroke Father   . Cancer  Paternal Grandmother     Simmons organ ca    ALLERGIES:  is allergic to metformin and related; tape; levaquin; and penicillins.  MEDICATIONS:  Current Outpatient Prescriptions  Medication Sig Dispense Refill  . allopurinol (ZYLOPRIM) 300 MG tablet Take 1 tablet (300 mg total) by mouth daily. (Patient taking differently: Take 300 mg by mouth every morning. ) 30 tablet 3  . aspirin EC 81 MG tablet Take 81 mg by mouth every morning.     . Biotin 5000 MCG TABS Take 1 tablet by mouth every morning.    . Cyanocobalamin (VITAMIN B 12 PO) Take 1 tablet by mouth every morning.     [name] HYDROcodone-acetaminophen (NORCO) 5-325 MG per tablet Take 1-2 tablets by mouth every 6 (six) hours as needed for moderate pain or severe pain. 30 tablet 0  . levothyroxine (SYNTHROID, LEVOTHROID) 150 MCG tablet Take 150 mcg by mouth daily before breakfast.    . lidocaine-prilocaine (EMLA) cream Apply cream to port-a-cath 1-2 hours prior to access. 30 g 3  . loperamide (IMODIUM) 1 MG/5ML solution Take 2 mg by mouth as needed for diarrhea or loose stools.    [name] LORazepam (ATIVAN) 0.5 MG tablet Take 1 tablet (0.5 mg total) by mouth every 6 (six) hours as needed (Nausea or vomiting). 30 tablet 0  . Multiple Vitamin (MULITIVITAMIN WITH MINERALS) TABS Take 1 tablet by mouth every morning.     . ondansetron (ZOFRAN) 8 MG tablet Take 1 tablet (8 mg total) by mouth 2 (two) times daily. Start the day after chemo for 3 days. Then as needed for nausea or vomiting. 30 tablet 1  . pegfilgrastim (NEULASTA) 6 MG/0.6ML injection Inject 6 mg into the skin once.    . predniSONE (DELTASONE) 20 MG tablet Take 5 tablets (100 mg total) by mouth daily. Take on days 1-5 of chemotherapy. 60 tablet 2  . PRESCRIPTION MEDICATION Chemo - [facility]    . prochlorperazine (COMPAZINE) 10 MG tablet Take 1 tablet (10 mg total) by mouth every 6 (six) hours as needed (Nausea or vomiting). 30 tablet 6  . Vitamin D, Cholecalciferol, 1000 UNITS TABS Take 2,000 Int'l  Units by mouth daily at 12 noon.     . [DISCONTINUED] calcium-vitamin D (OSCAL WITH D) 500-200 MG-UNIT per tablet Take 1 tablet by mouth daily.     No current facility-administered medications for this visit.   Facility-Administered Medications Ordered in Other Visits  Medication Dose Route Frequency Provider Last Rate Last Dose  . 0.9 %  sodium chloride infusion   Intravenous Once Aasim Marla Roe, MD   Stopped at 10/25/14 1925  . sodium chloride 0.9 % injection 10 mL  10 mL Intracatheter PRN Aasim Marla Roe, MD   10 mL at 10/25/14 1925  . sodium chloride 0.9 % injection 10 mL  10 mL Intracatheter PRN Aasim Marla Roe, MD   10 mL at 11/15/14 1807    REVIEW OF SYSTEMS:   Constitutional: Denies fevers, chills or abnormal night sweats Eyes: Denies blurriness of vision, double vision or watery eyes Ears, nose, mouth, throat, and  face: Denies mucositis or sore throat Respiratory: Denies cough, dyspnea or wheezes Cardiovascular: Denies palpitation, chest discomfort or lower extremity swelling Skin: Denies abnormal skin rashes Lymphatics: Denies new lymphadenopathy or easy bruising Behavioral/Psych: Mood is stable, no new changes  All other systems were reviewed with the patient and are negative.  PHYSICAL EXAMINATION: ECOG PERFORMANCE STATUS: 1 - Symptomatic but completely ambulatory  Filed Vitals:   11/15/14 0956  BP: 142/75  Pulse: 80  Temp: 97.7 F (36.5 C)  Resp: 18   Filed Weights   11/15/14 0956  Weight: 169 lb 4.8 oz (76.794 kg)    GENERAL:alert, no distress and comfortable SKIN: skin color, texture, turgor are normal, no rashes or significant lesions EYES: normal, conjunctiva are pink and non-injected, sclera clear OROPHARYNX:no exudate, normal lips, buccal mucosa, and tongue  NECK: supple, well-healed thyroidectomy scar. No palpable lymphadenopathy. LYMPH:  no palpable lymphadenopathy in the cervical, axillary or inguinal LUNGS: clear to auscultation and  percussion with normal breathing effort HEART: regular rate & rhythm and no murmurs without lower extremity edema ABDOMEN:abdomen soft, non-tender and normal bowel sounds Musculoskeletal:no cyanosis of digits and no clubbing  PSYCH: alert & oriented x 3 with fluent speech NEURO: no focal motor/sensory deficits  LABORATORY DATA:  I have reviewed the data as listed Lab Results  Component Value Date   WBC 3.5* 11/15/2014   HGB 11.0* 11/15/2014   HCT 34.5* 11/15/2014   MCV 84.5 11/15/2014   PLT 465* 11/15/2014    Recent Labs  11/01/14 1210 11/01/14 1424 11/05/14 1323 11/15/14 0940  NA 137 140 141 141  K 4.6 4.2 3.5 3.9  CL  --  99  --   --   CO2 _0 GLUCOSE 105 101* 102 109  BUN 9.9 9 7.4 12.6  CREATININE 0.8 0.63 0.8 0.8  CALCIUM 9.4 9.0 9.3 9.5  GFRNONAA  --  >90  --   --   GFRAA  --  >90  --   --   PROT 6.7 6.7  --  6.6  ALBUMIN 3.5 3.5  --  3.6  AST 8 9  --  14  ALT 9 9  --  13  ALKPHOS 93 86  --  76  BILITOT 0.71 0.6  --  0.28    RADIOGRAPHIC STUDIES: I have personally reviewed the radiological images as listed and agreed with the findings in the report. Ct Biopsy  10/24/2014   CLINICAL DATA:  Lymphoma  EXAM: CT-GUIDED BONE MARROW ASPIRATE AND CORE.  MEDICATIONS AND MEDICAL HISTORY: Versed two mg, Fentanyl 100 mcg.  Additional Medications: None.  ANESTHESIA/SEDATION: Moderate sedation time: 15 minutes  PROCEDURE: The procedure, risks, benefits, and alternatives were explained to the patient. Questions regarding the procedure were encouraged and answered. The patient understands and consents to the procedure.  The back was prepped with Betadine in a sterile fashion, and a sterile drape was applied covering the operative field. A sterile gown and sterile gloves were used for the procedure.  Under CT guidance, an 11 gauge needle was inserted into the left iliac bone via posterior approach. Aspirates and a core were obtained. Final imaging was performed.  Patient  tolerated the procedure well without complication. Vital sign monitoring by nursing staff during the procedure will continue as patient is in the special procedures unit for post procedure observation.  FINDINGS: The images document guide needle placement within the left iliac bone. Post biopsy images demonstrate no hemorrhage.  IMPRESSION: Successful CT-guided bone  marrow aspirate and core.   Electronically Signed   By: Maryclare Bean M.D.   On: 10/24/2014 11:28   Dg Chest Port 1 View  11/01/2014   CLINICAL DATA:  Fever.  EXAM: PORTABLE CHEST - 1 VIEW  COMPARISON:  October 24, 2014.  FINDINGS: The heart size and mediastinal contours are within normal limits. Right internal jugular Port-A-Cath is noted with distal tip potentially in the right atrium based on this projection. The kink seen in the proximal portion of the Port-A-Cath is not well visualized on this study. Left lung is clear. Minimal linear density is noted in right lung base consistent with subsegmental atelectasis. No pneumothorax or pleural effusion is noted. The visualized skeletal structures are unremarkable.  IMPRESSION: Minimal subsegmental atelectasis seen in right lower lobe. Distal tip of right internal jugular Port-A-Cath projects over expected position of right atrium on this exam.   Electronically Signed   By: Sabino Dick M.D.   On: 11/01/2014 15:04   Dg Chest Portable 1 View  10/24/2014   CLINICAL DATA:  Post Port-A-Cath insertion  EXAM: PORTABLE CHEST - 1 VIEW  COMPARISON:  Portable exam compared to 01/29/2009. Exam is labeled with the time stamp of 1649 hr though it is currently only 1627 hr.  FINDINGS: RIGHT jugular Port-A-Cath with tip projecting over cavoatrial junction.  A kink is noted at the junction of the Port-A-Cath tubing and the Port-A-Cath.  Enlargement of cardiac silhouette.  Mediastinal contours and pulmonary vascularity normal.  Slightly rotated to the LEFT, which may account for prominence of the LEFT hilum.  Lungs  otherwise clear.  No pleural effusion or pneumothorax.  IMPRESSION: No pneumothorax following Port-A-Cath placement.  Kink at junction of the Port-A-Cath tubing and reservoir.  These results will be called to the ordering clinician or representative by the Radiologist Assistant, and communication documented in the PACS or zVision Dashboard.   Electronically Signed   By: Lavonia Dana M.D.   On: 10/24/2014 16:33   Dg Fluoro Guide Cv Line-no Report  10/24/2014   CLINICAL DATA:    FLOURO GUIDE CV LINE  Fluoroscopy was utilized by the requesting physician.  No radiographic  interpretation.     ASSESSMENT & PLAN:  Anemia in neoplastic disease This is likely due to recent treatment. The patient denies recent history of bleeding such as epistaxis, hematuria or hematochezia. She is asymptomatic from the anemia. I will observe for now.  She does not require transfusion now. I will continue the chemotherapy at current dose without dosage adjustment.  If the anemia gets progressive worse in the future, I might have to delay her treatment or adjust the chemotherapy dose.   Leukopenia due to antineoplastic chemotherapy This is likely due to recent treatment. The patient denies recent history of fevers, cough, chills, diarrhea or dysuria. She is asymptomatic from the leukopenia. I will observe for now. Plan to make dose adjustments and cover her with Neulasta each cycle.  Neuropathy due to chemotherapeutic drug She has significant neuropathy with only 1 cycle of treatment. I plan to reduce vincristine by 50%.  DLBCL (diffuse large B cell lymphoma) Reviewed the current guidelines with her. Due to involvement of Waldeyer's ring, she is at risk of CNS relapse. I shared with her the data of using intrathecal chemotherapy to prevent CNS relapse and she agreed to proceed. I plan to reduce the dose of Adriamycin, Cytoxan by 10% and vincristine by 50% due to her recent neutropenic fever and peripheral neuropathy. I will  add intrathecal chemotherapy with each cycle of therapy and plan to repeat PET/CT scan after cycle 3 of therapy. Intrathecal administration of chemotherapy Procedure Note   Informed consent was obtained and potential risks including bleeding, infection and pain were reviewed with the patient.  The patient's name, date of birth, identification, consent and allergies were verified prior to the start of procedure and time out was performed.  The skin was prepped with Betadine solution.   5 cc of 1% lidocaine was used to provide local anaesthesia.   The L3/L4 intrathecal space was chosen as the site of procedure.  1 cc of clear CSF was obtained.  Next, 5 ml/12 mg methotrexate was administered into the intrathecal space.  The procedure was tolerated well and there were no complications.  The patient was stable at the end of the procedure.    No orders of the defined types were placed in this encounter.    All questions were answered. The patient knows to call the clinic with any problems, questions or concerns. I spent 60 minutes counseling the patient face to face. The total time spent in the appointment was 80 minutes and more than 50% was on counseling.     [facility], [place], MD 11/15/2014 9:19 PM

## 2014-11-16 ENCOUNTER — Telehealth: Payer: Self-pay | Admitting: Medical Oncology

## 2014-11-16 ENCOUNTER — Telehealth: Payer: Self-pay | Admitting: *Deleted

## 2014-11-16 ENCOUNTER — Ambulatory Visit (HOSPITAL_BASED_OUTPATIENT_CLINIC_OR_DEPARTMENT_OTHER): Payer: 59

## 2014-11-16 DIAGNOSIS — Z5189 Encounter for other specified aftercare: Secondary | ICD-10-CM

## 2014-11-16 DIAGNOSIS — C8330 Diffuse large B-cell lymphoma, unspecified site: Secondary | ICD-10-CM

## 2014-11-16 MED ORDER — PEGFILGRASTIM INJECTION 6 MG/0.6ML ~~LOC~~
6.0000 mg | PREFILLED_SYRINGE | Freq: Once | SUBCUTANEOUS | Status: AC
  Administered 2014-11-16: 6 mg via SUBCUTANEOUS
  Filled 2014-11-16: qty 0.6

## 2014-11-16 NOTE — Telephone Encounter (Signed)
Per staff message and POF I have scheduled appts. Advised scheduler of appts. JMW  

## 2014-11-16 NOTE — Telephone Encounter (Signed)
Maria Simmons saw pt today and will make follow up note.

## 2014-11-16 NOTE — Patient Instructions (Signed)
Pegfilgrastim injection What is this medicine? PEGFILGRASTIM (peg fil GRA stim) is a long-acting granulocyte colony-stimulating factor that stimulates the growth of neutrophils, a type of white blood cell important in the body's fight against infection. It is used to reduce the incidence of fever and infection in patients with certain types of cancer who are receiving chemotherapy that affects the bone marrow. This medicine may be used for other purposes; ask your health care provider or pharmacist if you have questions. COMMON BRAND NAME(S): Neulasta What should I tell my health care provider before I take this medicine? They need to know if you have any of these conditions: -latex allergy -ongoing radiation therapy -sickle cell disease -skin reactions to acrylic adhesives (On-Body Injector only) -an unusual or allergic reaction to pegfilgrastim, filgrastim, other medicines, foods, dyes, or preservatives -pregnant or trying to get pregnant -breast-feeding How should I use this medicine? This medicine is for injection under the skin. If you get this medicine at home, you will be taught how to prepare and give the pre-filled syringe or how to use the On-body Injector. Refer to the patient Instructions for Use for detailed instructions. Use exactly as directed. Take your medicine at regular intervals. Do not take your medicine more often than directed. It is important that you put your used needles and syringes in a special sharps container. Do not put them in a trash can. If you do not have a sharps container, call your pharmacist or healthcare provider to get one. Talk to your pediatrician regarding the use of this medicine in children. Special care may be needed. Overdosage: If you think you have taken too much of this medicine contact a poison control center or emergency room at once. NOTE: This medicine is only for you. Do not share this medicine with others. What if I miss a dose? It is  important not to miss your dose. Call your doctor or health care professional if you miss your dose. If you miss a dose due to an On-body Injector failure or leakage, a new dose should be administered as soon as possible using a single prefilled syringe for manual use. What may interact with this medicine? Interactions have not been studied. Give your health care provider a list of all the medicines, herbs, non-prescription drugs, or dietary supplements you use. Also tell them if you smoke, drink alcohol, or use illegal drugs. Some items may interact with your medicine. This list may not describe all possible interactions. Give your health care provider a list of all the medicines, herbs, non-prescription drugs, or dietary supplements you use. Also tell them if you smoke, drink alcohol, or use illegal drugs. Some items may interact with your medicine. What should I watch for while using this medicine? You may need blood work done while you are taking this medicine. If you are going to need a MRI, CT scan, or other procedure, tell your doctor that you are using this medicine (On-Body Injector only). What side effects may I notice from receiving this medicine? Side effects that you should report to your doctor or health care professional as soon as possible: -allergic reactions like skin rash, itching or hives, swelling of the face, lips, or tongue -dizziness -fever -pain, redness, or irritation at site where injected -pinpoint red spots on the skin -shortness of breath or breathing problems -stomach or side pain, or pain at the shoulder -swelling -tiredness -trouble passing urine Side effects that usually do not require medical attention (report to your doctor   or health care professional if they continue or are bothersome): -bone pain -muscle pain This list may not describe all possible side effects. Call your doctor for medical advice about side effects. You may report side effects to FDA at  1-800-FDA-1088. Where should I keep my medicine? Keep out of the reach of children. Store pre-filled syringes in a refrigerator between 2 and 8 degrees C (36 and 46 degrees F). Do not freeze. Keep in carton to protect from light. Throw away this medicine if it is left out of the refrigerator for more than 48 hours. Throw away any unused medicine after the expiration date. NOTE: This sheet is a summary. It may not cover all possible information. If you have questions about this medicine, talk to your doctor, pharmacist, or health care provider.  2015, Elsevier/Gold Standard. (2014-03-01 16:14:05)  

## 2014-11-22 ENCOUNTER — Encounter: Payer: Self-pay | Admitting: Hematology and Oncology

## 2014-11-22 ENCOUNTER — Telehealth: Payer: Self-pay | Admitting: *Deleted

## 2014-11-22 NOTE — Progress Notes (Signed)
Put fmla form on nurse's desk °

## 2014-11-22 NOTE — Telephone Encounter (Signed)
Pt left VM states her work is requesting letter from Dr. Alvy Bimler stating she needed to be out of work from 12/03 through 12/13.   Pt says Dr. Alvy Bimler advised her to stay out of work for ten days following her last treatment.  Letter placed on Dr. Calton Dach desk for signature.

## 2014-11-23 ENCOUNTER — Encounter: Payer: Self-pay | Admitting: Medical Oncology

## 2014-11-23 NOTE — Progress Notes (Signed)
Work release letter mailed to pt.

## 2014-11-26 ENCOUNTER — Encounter: Payer: Self-pay | Admitting: Hematology and Oncology

## 2014-11-26 NOTE — Progress Notes (Signed)
Faxed fmla form to [phone]

## 2014-11-27 ENCOUNTER — Encounter (HOSPITAL_COMMUNITY): Payer: Self-pay

## 2014-12-03 ENCOUNTER — Other Ambulatory Visit: Payer: Self-pay | Admitting: Hematology and Oncology

## 2014-12-04 ENCOUNTER — Other Ambulatory Visit: Payer: Self-pay | Admitting: Hematology and Oncology

## 2014-12-04 ENCOUNTER — Telehealth: Payer: Self-pay | Admitting: Hematology and Oncology

## 2014-12-04 ENCOUNTER — Encounter (HOSPITAL_BASED_OUTPATIENT_CLINIC_OR_DEPARTMENT_OTHER): Payer: 59 | Admitting: Hematology and Oncology

## 2014-12-04 ENCOUNTER — Ambulatory Visit (HOSPITAL_BASED_OUTPATIENT_CLINIC_OR_DEPARTMENT_OTHER): Payer: 59 | Admitting: Hematology and Oncology

## 2014-12-04 ENCOUNTER — Ambulatory Visit (HOSPITAL_BASED_OUTPATIENT_CLINIC_OR_DEPARTMENT_OTHER): Payer: 59 | Admitting: Lab

## 2014-12-04 ENCOUNTER — Encounter: Payer: Self-pay | Admitting: Hematology and Oncology

## 2014-12-04 DIAGNOSIS — Z5111 Encounter for antineoplastic chemotherapy: Secondary | ICD-10-CM

## 2014-12-04 DIAGNOSIS — D72818 Other decreased white blood cell count: Secondary | ICD-10-CM

## 2014-12-04 DIAGNOSIS — C8330 Diffuse large B-cell lymphoma, unspecified site: Secondary | ICD-10-CM

## 2014-12-04 DIAGNOSIS — G620 Drug-induced polyneuropathy: Secondary | ICD-10-CM

## 2014-12-04 DIAGNOSIS — T451X5A Adverse effect of antineoplastic and immunosuppressive drugs, initial encounter: Secondary | ICD-10-CM

## 2014-12-04 DIAGNOSIS — D630 Anemia in neoplastic disease: Secondary | ICD-10-CM

## 2014-12-04 DIAGNOSIS — Z5189 Encounter for other specified aftercare: Secondary | ICD-10-CM

## 2014-12-04 DIAGNOSIS — D701 Agranulocytosis secondary to cancer chemotherapy: Secondary | ICD-10-CM

## 2014-12-04 LAB — CBC WITH DIFFERENTIAL/PLATELET
BASO%: 1.2 % (ref 0.0–2.0)
Basophils Absolute: 0 10*3/uL (ref 0.0–0.1)
EOS%: 0.7 % (ref 0.0–7.0)
Eosinophils Absolute: 0 10*3/uL (ref 0.0–0.5)
HEMATOCRIT: 35 % (ref 34.8–46.6)
HEMOGLOBIN: 11.1 g/dL — AB (ref 11.6–15.9)
LYMPH%: 30.9 % (ref 14.0–49.7)
MCH: 26.9 pg (ref 25.1–34.0)
MCHC: 31.8 g/dL (ref 31.5–36.0)
MCV: 84.5 fL (ref 79.5–101.0)
MONO#: 0.5 10*3/uL (ref 0.1–0.9)
MONO%: 21.3 % — AB (ref 0.0–14.0)
NEUT%: 45.9 % (ref 38.4–76.8)
NEUTROS ABS: 1.2 10*3/uL — AB (ref 1.5–6.5)
PLATELETS: 288 10*3/uL (ref 145–400)
RBC: 4.15 10*6/uL (ref 3.70–5.45)
RDW: 17.3 % — ABNORMAL HIGH (ref 11.2–14.5)
WBC: 2.5 10*3/uL — AB (ref 3.9–10.3)
lymph#: 0.8 10*3/uL — ABNORMAL LOW (ref 0.9–3.3)

## 2014-12-04 LAB — COMPREHENSIVE METABOLIC PANEL (CC13)
ALT: 14 U/L (ref 0–55)
AST: 14 U/L (ref 5–34)
Albumin: 3.6 g/dL (ref 3.5–5.0)
Alkaline Phosphatase: 75 U/L (ref 40–150)
Anion Gap: 8 mEq/L (ref 3–11)
BUN: 12 mg/dL (ref 7.0–26.0)
CALCIUM: 9.6 mg/dL (ref 8.4–10.4)
CO2: 30 mEq/L — ABNORMAL HIGH (ref 22–29)
CREATININE: 0.8 mg/dL (ref 0.6–1.1)
Chloride: 103 mEq/L (ref 98–109)
Glucose: 111 mg/dl (ref 70–140)
Potassium: 4.9 mEq/L (ref 3.5–5.1)
Sodium: 141 mEq/L (ref 136–145)
Total Bilirubin: 0.39 mg/dL (ref 0.20–1.20)
Total Protein: 6.5 g/dL (ref 6.4–8.3)

## 2014-12-04 LAB — LACTATE DEHYDROGENASE (CC13): LDH: 174 U/L (ref 125–245)

## 2014-12-04 MED ORDER — HEPARIN SOD (PORK) LOCK FLUSH 100 UNIT/ML IV SOLN
500.0000 [IU] | Freq: Once | INTRAVENOUS | Status: AC
  Administered 2014-12-04: 500 [IU] via INTRAVENOUS
  Filled 2014-12-04: qty 5

## 2014-12-04 MED ORDER — SODIUM CHLORIDE 0.9 % IJ SOLN
Freq: Once | INTRAMUSCULAR | Status: AC
  Administered 2014-12-04: 13:00:00 via INTRATHECAL
  Filled 2014-12-04: qty 0.48

## 2014-12-04 MED ORDER — TBO-FILGRASTIM 480 MCG/0.8ML ~~LOC~~ SOSY
480.0000 ug | PREFILLED_SYRINGE | Freq: Once | SUBCUTANEOUS | Status: AC
  Administered 2014-12-04: 480 ug via SUBCUTANEOUS
  Filled 2014-12-04: qty 0.8

## 2014-12-04 MED ORDER — SODIUM CHLORIDE 0.9 % IJ SOLN
10.0000 mL | Freq: Once | INTRAMUSCULAR | Status: AC
  Administered 2014-12-04: 10 mL via INTRAVENOUS
  Filled 2014-12-04: qty 10

## 2014-12-04 MED ORDER — SODIUM CHLORIDE 0.9 % IV SOLN
1500.0000 mL | INTRAVENOUS | Status: AC
  Administered 2014-12-04: 1500 mL via INTRAVENOUS

## 2014-12-04 NOTE — Progress Notes (Signed)
This encounter was created in error - please disregard.

## 2014-12-04 NOTE — Patient Instructions (Signed)
Methotrexate injection What is this medicine? METHOTREXATE (METH oh TREX ate) is a chemotherapy drug. This medicine affects cells that are rapidly growing, such as cancer cells and cells in your mouth and stomach. It is used to treat many cancers and other medical conditions. It is used for leukemias, lymphomas, breast cancer, lung cancer, head and neck cancers, and other cancers. This medicine also works on the immune system and is commonly used to treat psoriasis and rheumatoid arthritis. This medicine may be used for other purposes; ask your health care provider or pharmacist if you have questions. What should I tell my health care provider before I take this medicine? They need to know if you have any of these conditions: -if you frequently drink alcohol containing drinks -infection (especially a virus infection such as chickenpox, cold sores, or herpes) -immune system problems -kidney disease -liver disease -low blood counts, like platelets, red bloods, or white blood cells -lung disease -recent or ongoing radiation therapy -an unusual or allergic reaction to methotrexate, benzyl alcohol, other medicines, foods, dyes, or preservatives -pregnant or trying to get pregnant -breast-feeding How should I use this medicine? This drug is given as an injection into a muscle or into a vein. It may also be given into the spinal fluid. It is administered in a hospital or clinic by a specially trained health care professional. Talk to your pediatrician regarding the use of this medicine in children. While this drug may be prescribed for selected conditions, precautions do apply. Overdosage: If you think you have taken too much of this medicine contact a poison control center or emergency room at once. NOTE: This medicine is only for you. Do not share this medicine with others. What if I miss a dose? It is important not to miss your dose. Call your doctor or health care professional if you are unable  to keep an appointment. What may interact with this medicine? -antibiotics and other medicines for infections -aspirin and aspirin-like medicines including bismuth subsalicylate (Pepto-Bismol) -cisplatin -dapsone -folic acid in supplements or vitamins -mercaptopurine -NSAIDs, medicines for pain and inflammation, like ibuprofen or naproxen -pemetrexed -phenylbutazone -phenytoin -probenecid -pyrimethamine -theophylline -trimetrexate -vaccines This list may not describe all possible interactions. Give your health care provider a list of all the medicines, herbs, non-prescription drugs, or dietary supplements you use. Also tell them if you smoke, drink alcohol, or use illegal drugs. Some items may interact with your medicine. What should I watch for while using this medicine? Visit your doctor for checks on your progress. You will need to have regular blood checks during your treatment to monitor your blood, liver function, and kidney function. This drug may make you feel generally unwell. This is not uncommon, as chemotherapy can affect healthy cells as well as cancer cells. Report any side effects. Continue your course of treatment even though you feel ill unless your doctor tells you to stop. In some cases, you may be given additional medicines to help with side effects. Follow all directions for their use. Call your doctor or health care professional for advice if you get a fever, chills or sore throat, or other symptoms of a cold or flu. Do not treat yourself. This drug decreases your body's ability to fight infections. Try to avoid being around people who are sick. This medicine may increase your risk to bruise or bleed. Call your doctor or health care professional if you notice any unusual bleeding. Be careful brushing and flossing your teeth or using a toothpick because   you may get an infection or bleed more easily. If you have any dental work done, tell your dentist you are receiving  this medicine. Avoid taking products that contain aspirin, acetaminophen, ibuprofen, naproxen, or ketoprofen unless instructed by your doctor. These medicines may hide a fever. This medicine can make you more sensitive to the sun. Keep out of the sun. If you cannot avoid being in the sun, wear protective clothing and use sunscreen. Do not use sun lamps or tanning beds/booths. Do not treat diarrhea with over the counter products. Contact your doctor if you have diarrhea. To protect your kidneys, drink water or other fluids as directed while you are taking this medicine. Do not drink alcohol-containing drinks while taking this medicine. Both alcohol and the medicine may cause damage to your liver. Men and women must use effective birth control while they are taking this medicine. Do not become pregnant while taking this medicine. Women must continue using effective birth control for 1 full menstrual cycle after stopping this medicine. Tell your doctor right away if you think that you or your partner might be pregnant. There is a potential for serious side effects to an unborn child. Talk to your health care professional or pharmacist for more information. Do not breast-feed an infant while taking this medicine. Men must continue effective birth control for 3 months after stopping this medicine. What side effects may I notice from receiving this medicine? Side effects that you should report to your doctor or health care professional as soon as possible: -allergic reactions like skin rash, itching or hives, swelling of the face, lips, or tongue -low blood counts - this medicine may decrease the number of white blood cells, red blood cells and platelets. You may be at increased risk for infections and bleeding. -signs of infection - fever or chills, cough, sore throat, pain or difficulty passing urine -signs of decreased platelets or bleeding - bruising, pinpoint red spots on the skin, black, tarry stools,  blood in the urine -signs of decreased red blood cells - unusually weak or tired, fainting spells, lightheadedness -breathing problems, like a dry cough -changes in vision -confusion, not alert -diarrhea -mouth or throat sores or ulcers -problems with balance, talking, walking -redness, blistering, peeling or loosening of the skin, including inside the mouth -seizures -trouble passing urine or change in the amount of urine -vomiting -yellowing of the eyes or skin Side effects that usually do not require medical attention (report to your doctor or health care professional if they continue or are bothersome): -change in skin color -eye irritation -hair loss -headache -loss of appetite -nausea -stomach upset This list may not describe all possible side effects. Call your doctor for medical advice about side effects. You may report side effects to FDA at 1-800-FDA-1088. Where should I keep my medicine? This drug is given in a hospital or clinic and will not be stored at home. NOTE: This sheet is a summary. It may not cover all possible information. If you have questions about this medicine, talk to your doctor, pharmacist, or health care provider.  2015, Elsevier/Gold Standard. (2008-06-07 11:13:24)   Lumbar Puncture A lumbar puncture, or spinal tap, is a procedure in which a small amount of the fluid that surrounds the brain and spinal cord is removed and examined. The fluid is called the cerebrospinal fluid. This procedure may be done to:   Help diagnose various problems, such as meningitis, encephalitis, multiple sclerosis, and AIDS.   Remove fluid and relieve pressure  that occurs with certain types of headaches.   Look for bleeding within the brain and spinal cord areas (central nervous system).   Place medicine into the spinal fluid.  LET [facility] CARE PROVIDER KNOW ABOUT:  Any allergies you have.  All medicines you are taking, including vitamins, herbs, eye drops,  creams, and over-the-counter medicines.  Previous problems you or members of your family have had with the use of anesthetics.  Any blood disorders you have.  Previous surgeries you have had.  Medical conditions you have. RISKS AND COMPLICATIONS Generally, this is a safe procedure. However, as with any procedure, complications can occur. Possible complications include:   Spinal headache. This is a severe headache that occurs when there is a leak of spinal fluid. A spinal headache causes discomfort but is not dangerous. If it persists, another procedure may be done to treat the headache.  Bleeding. This most often occurs in people with bleeding disorders. These are disorders in which the blood does not clot normally.   Infection at the insertion site that can spread to the bone or spinal fluid.  Formation of a spinal cord tumor (rare).  Brain herniation or movement of the brain into the spinal cord (rare).  Inability to move (extremely rare). BEFORE THE PROCEDURE  You may have blood tests done. These tests can help tell how well your kidneys and liver are working. They can also show how well your blood clots.   If you take blood thinners (anticoagulant medicine), ask your health care provider if and when you should stop taking them.   Your health care provider may order a CT scan of your brain.  Make arrangements for someone to drive you home after the procedure.  PROCEDURE  You will be positioned so that the spaces between the bones of the spine (vertebrae) are as wide as possible. This will make it easier to pass the needle into the spinal canal.  Depending on your age and size, you may lie on your side, curled up with your knees under your chin. Or, you may sit with your head resting on a pillow that is placed at waist level.  The skin covering the lower back (or lumbar region) will be cleaned.   The skin may be numbed with medicine.  You may be given pain  medicine or a medicine to help you relax (sedative).  A small needle will be inserted in the skin until it enters the space that contains the spinal fluid. The needle will not enter the spinal cord.   The spinal fluid will be collected into tubes.   The needle will be withdrawn, and a bandage will be placed on the site.  AFTER THE PROCEDURE  You will remain lying down for 1 hour or for as long as your health care provider suggests.   The spinal fluid will be sent to a laboratory to be examined. The results of the examination may be available before you go home.  A test, called a culture, may be taken of the spinal fluid if your health care provider thinks you have an infection. If cultures were taken for exam, the results will usually be available in a couple of days.  Document Released: 11/27/2000 Document Revised: 09/20/2013 Document Reviewed: 08/07/2013 [facility] Patient Information 2015 [place], [place]. This information is not intended to replace advice given to you by your health care provider. Make sure you discuss any questions you have with your health care provider.

## 2014-12-04 NOTE — Assessment & Plan Note (Signed)
This is likely due to recent treatment. The patient denies recent history of fevers, cough, chills, diarrhea or dysuria. She is asymptomatic from the leukopenia. I will observe for now.  I will give her one dose of G-CSF today. We will continue same dose adjustment as cycle 2.

## 2014-12-04 NOTE — Assessment & Plan Note (Signed)
She tolerated treatment well without major side effects. I will give her intrathecal chemotherapy today and she will proceed with chemotherapy in 2 days. Due to leukopenia, I recommend G-CSF injection today and she agreed to proceed.

## 2014-12-04 NOTE — Assessment & Plan Note (Signed)
This has improved since we reduced the dose of chemotherapy. Continue same dose adjustment.

## 2014-12-04 NOTE — Assessment & Plan Note (Signed)
This is likely due to recent treatment. The patient denies recent history of bleeding such as epistaxis, hematuria or hematochezia. She is asymptomatic from the anemia. I will observe for now.  She does not require transfusion now. I will continue the chemotherapy at current dose with same dosage adjustment.

## 2014-12-04 NOTE — Progress Notes (Signed)
[facility] OFF[facility] PROGRESS NOTE  Patient Care Team: Dorisann Frames, MD as PCP - General (Endocrinology) Annamaria Boots, MD as Consulting Physician (Gynecology)  SUMMARY OF ONCOLOGIC HISTORY: Oncology History   DLBCL (diffuse large B cell lymphoma)   Staging form: Lymphoid Neoplasms, AJCC 6th Edition     Clinical stage from 11/15/2014: Stage II - Signed by Artis Delay, MD on 11/15/2014       DLBCL (diffuse large B cell lymphoma)   09/14/2014 Imaging PET/CT scan showed Hypermetabolic bilateral cervical lymphadenopathy, left side greater than right, and sub-cm hypermetabolic left subpectoral lymph node. Hypermetabolic activity throughout the lingular and palatine tonsils in Waldeyer's ring   10/12/2014 Procedure Biopsy of the left neck lymph node came back positive for diffuse large B-cell lymphoma.Accession: [id number]   10/24/2014 Bone Marrow Biopsy Bone marrow biopsy was negative.Cytogenetics were normal   10/24/2014 Imaging Echocardiogram showed normal ejection fraction   10/25/2014 -  Chemotherapy She received R CHOP chemotherapy. Further doses of R CHOP chemotherapy was reduced due to recent neutropenic fever with cycle 1 of treatment.   11/01/2014 Adverse Reaction She was seen in the emergency Department for possible neutropenic fever. Cultures were negative. She received antibiotic therapy.   11/15/2014 -  Chemotherapy Intrathecal chemotherapy is added due to risk of CNS relapse    INTERVAL HISTORY: Please see below for problem oriented charting. She is seen prior to IT chemotherapy today. Since I reduced the dose of treatment, she tolerated that better. Denies mucositis. 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 She denies recent headache.  REVIEW OF SYSTEMS:   Constitutional: Denies fevers, chills or abnormal weight loss Eyes: Denies blurriness of vision Ears, nose, mouth, throat, and face: Denies mucositis or sore throat Respiratory: Denies cough, dyspnea or  wheezes Cardiovascular: Denies palpitation, chest discomfort or lower extremity swelling Gastrointestinal:  Denies nausea, heartburn or change in bowel habits Skin: Denies abnormal skin rashes Lymphatics: Denies new lymphadenopathy or easy bruising Behavioral/Psych: Mood is stable, no new changes  All other systems were reviewed with the patient and are negative.  I have reviewed the past medical history, past surgical history, social history and family history with the patient and they are unchanged from previous note.  ALLERGIES:  is allergic to metformin and related; tape; levaquin; and penicillins.  MEDICATIONS:  Current Outpatient Prescriptions  Medication Sig Dispense Refill  . allopurinol (ZYLOPRIM) 300 MG tablet Take 1 tablet (300 mg total) by mouth daily. (Patient taking differently: Take 300 mg by mouth every morning. ) 30 tablet 3  . aspirin EC 81 MG tablet Take 81 mg by mouth every morning.     . Biotin 5000 MCG TABS Take 1 tablet by mouth every morning.    . Cyanocobalamin (VITAMIN B 12 PO) Take 1 tablet by mouth every morning.     [name] HYDROcodone-acetaminophen (NORCO) 5-325 MG per tablet Take 1-2 tablets by mouth every 6 (six) hours as needed for moderate pain or severe pain. 30 tablet 0  . levothyroxine (SYNTHROID, LEVOTHROID) 150 MCG tablet Take 150 mcg by mouth daily before breakfast.    . lidocaine-prilocaine (EMLA) cream Apply cream to port-a-cath 1-2 hours prior to access. 30 g 3  . loperamide (IMODIUM) 1 MG/5ML solution Take 2 mg by mouth as needed for diarrhea or loose stools.    [name] LORazepam (ATIVAN) 0.5 MG tablet Take 1 tablet (0.5 mg total) by mouth every 6 (six) hours as needed (Nausea or vomiting). 30 tablet 0  . Multiple Vitamin (MULITIVITAMIN WITH  MINERALS) TABS Take 1 tablet by mouth every morning.     . ondansetron (ZOFRAN) 8 MG tablet Take 1 tablet (8 mg total) by mouth 2 (two) times daily. Start the day after chemo for 3 days. Then as needed for nausea or  vomiting. 30 tablet 1  . pegfilgrastim (NEULASTA) 6 MG/0.6ML injection Inject 6 mg into the skin once.    . predniSONE (DELTASONE) 20 MG tablet Take 5 tablets (100 mg total) by mouth daily. Take on days 1-5 of chemotherapy. 60 tablet 2  . PRESCRIPTION MEDICATION Chemo - [facility]    . prochlorperazine (COMPAZINE) 10 MG tablet Take 1 tablet (10 mg total) by mouth every 6 (six) hours as needed (Nausea or vomiting). 30 tablet 6  . Vitamin D, Cholecalciferol, 1000 UNITS TABS Take 2,000 Int'l Units by mouth daily at 12 noon.     . [DISCONTINUED] calcium-vitamin D (OSCAL WITH D) 500-200 MG-UNIT per tablet Take 1 tablet by mouth daily.     No current facility-administered medications for this visit.   Facility-Administered Medications Ordered in Other Visits  Medication Dose Route Frequency Provider Last Rate Last Dose  . 0.9 %  sodium chloride infusion   Intravenous Once Aasim Marla Roe, MD   Stopped at 10/25/14 1925  . 0.9 %  sodium chloride infusion  1,500 mL Intravenous Continuous Jacquline Terrill, MD 750 mL/hr at 12/04/14 1150 1,500 mL at 12/04/14 1150  . heparin lock flush 100 unit/mL  500 Units Intravenous Once Heath Lark, MD      . sodium chloride 0.9 % injection 10 mL  10 mL Intracatheter PRN Aasim Marla Roe, MD   10 mL at 10/25/14 1925  . sodium chloride 0.9 % injection 10 mL  10 mL Intravenous Once Heath Lark, MD        PHYSICAL EXAMINATION: ECOG PERFORMANCE STATUS: 1 - Symptomatic but completely ambulatory  There were no vitals filed for this visit. There were no vitals filed for this visit.  GENERAL:alert, no distress and comfortable SKIN: skin color, texture, turgor are normal, no rashes or significant lesions EYES: normal, Conjunctiva are pink and non-injected, sclera clear OROPHARYNX:no exudate, no erythema and lips, buccal mucosa, and tongue normal  NECK: supple, thyroid normal size, non-tender, without nodularity LYMPH:  no palpable lymphadenopathy in the cervical, axillary or  inguinal LUNGS: clear to auscultation and percussion with normal breathing effort HEART: regular rate & rhythm and no murmurs and no lower extremity edema ABDOMEN:abdomen soft, non-tender and normal bowel sounds Musculoskeletal:no cyanosis of digits and no clubbing  NEURO: alert & oriented x 3 with fluent speech, no focal motor/sensory deficits  LABORATORY DATA:  I have reviewed the data as listed    Component Value Date/Time   NA 141 12/04/2014 1114   NA 140 11/01/2014 1424   K 4.9 12/04/2014 1114   K 4.2 11/01/2014 1424   CL 99 11/01/2014 1424   CL 102 08/31/2012 1336   CO2 30* 12/04/2014 1114   CO2 26 11/01/2014 1424   GLUCOSE 111 12/04/2014 1114   GLUCOSE 101* 11/01/2014 1424   GLUCOSE 95 08/31/2012 1336   BUN 12.0 12/04/2014 1114   BUN 9 11/01/2014 1424   CREATININE 0.8 12/04/2014 1114   CREATININE 0.63 11/01/2014 1424   CALCIUM 9.6 12/04/2014 1114   CALCIUM 9.0 11/01/2014 1424   PROT 6.5 12/04/2014 1114   PROT 6.7 11/01/2014 1424   ALBUMIN 3.6 12/04/2014 1114   ALBUMIN 3.5 11/01/2014 1424   AST 14 12/04/2014 1114  AST 9 11/01/2014 1424   ALT 14 12/04/2014 1114   ALT 9 11/01/2014 1424   ALKPHOS 75 12/04/2014 1114   ALKPHOS 86 11/01/2014 1424   BILITOT 0.39 12/04/2014 1114   BILITOT 0.6 11/01/2014 1424   GFRNONAA >90 11/01/2014 1424   GFRAA >90 11/01/2014 1424    No results found for: SPEP, UPEP  Lab Results  Component Value Date   WBC 2.5* 12/04/2014   NEUTROABS 1.2* 12/04/2014   HGB 11.1* 12/04/2014   HCT 35.0 12/04/2014   MCV 84.5 12/04/2014   PLT 288 12/04/2014      Chemistry      Component Value Date/Time   NA 141 12/04/2014 1114   NA 140 11/01/2014 1424   K 4.9 12/04/2014 1114   K 4.2 11/01/2014 1424   CL 99 11/01/2014 1424   CL 102 08/31/2012 1336   CO2 30* 12/04/2014 1114   CO2 26 11/01/2014 1424   BUN 12.0 12/04/2014 1114   BUN 9 11/01/2014 1424   CREATININE 0.8 12/04/2014 1114   CREATININE 0.63 11/01/2014 1424      Component Value  Date/Time   CALCIUM 9.6 12/04/2014 1114   CALCIUM 9.0 11/01/2014 1424   ALKPHOS 75 12/04/2014 1114   ALKPHOS 86 11/01/2014 1424   AST 14 12/04/2014 1114   AST 9 11/01/2014 1424   ALT 14 12/04/2014 1114   ALT 9 11/01/2014 1424   BILITOT 0.39 12/04/2014 1114   BILITOT 0.6 11/01/2014 1424     ASSESSMENT & PLAN:  DLBCL (diffuse large B cell lymphoma) She tolerated treatment well without major side effects. I will give her intrathecal chemotherapy today and she will proceed with chemotherapy in 2 days. Due to leukopenia, I recommend G-CSF injection today and she agreed to proceed.  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 The patient denies recent history of bleeding such as epistaxis, hematuria or hematochezia. She is asymptomatic from the anemia. I will observe for now.  She does not require transfusion now. I will continue the chemotherapy at current dose with same dosage adjustment.   Leukopenia due to antineoplastic chemotherapy This is likely due to recent treatment. The patient denies recent history of fevers, cough, chills, diarrhea or dysuria. She is asymptomatic from the leukopenia. I will observe for now.  I will give her one dose of G-CSF today. We will continue same dose adjustment as cycle 2.   Neuropathy due to chemotherapeutic drug This has improved since we reduced the dose of chemotherapy. Continue same dose adjustment.  Intrathecal administration of chemotherapy Procedure Note   Informed consent was obtained and potential risks including bleeding, infection and pain were reviewed with the patient.  The patient's name, date of birth, identification, consent and allergies were verified prior to the start of procedure and time out was performed.  The skin was prepped with Betadine solution.   5 cc of 1% lidocaine was used to provide local anaesthesia.   The L3/L4 intrathecal space was chosen as the site of procedure.  1 cc of clear  CSF was obtained.  Next, 5 ml/12 mg methotrexate was administered into the intrathecal space.  The procedure was tolerated well and there were no complications.  The patient was stable at the end of the procedure.   Orders Placed This Encounter  Procedures  . NM PET Image Restag (PS) Skull Base To Thigh    Standing Status: Future     Number of Occurrences:  Standing Expiration Date: 02/03/2016    Order Specific Question:  Reason for Exam (SYMPTOM  OR DIAGNOSIS REQUIRED)    Answer:  staging lymphoma, assess response to Rx    Order Specific Question:  Is the patient pregnant?    Answer:  No    Order Specific Question:  Preferred imaging location?    Answer:  [facility]  . Comprehensive metabolic panel    Standing Status: Future     Number of Occurrences:      Standing Expiration Date: 02/03/2016  . Lactate dehydrogenase    Standing Status: Future     Number of Occurrences:      Standing Expiration Date: 02/03/2016   All questions were answered. The patient knows to call the clinic with any problems, questions or concerns. No barriers to learning was detected. I spent 40 minutes counseling the patient face to face. The total time spent in the appointment was 60 minutes and more than 50% was on counseling and review of test results     [facility], [place], MD 12/04/2014 1:51 PM

## 2014-12-04 NOTE — Progress Notes (Signed)
Lumbar puncture and methotrexate injection performed by Dr. Alvy Bimler after consent signed per patiernt. Tolerated well. Iflaty, supine position for 30 minutes after procedure.  Granix injection given as ordered.  VSS post procedure. No c/o headache or any other discomfort

## 2014-12-04 NOTE — Telephone Encounter (Signed)
Gave pt updated sch in chemo room, labs/ov per 12/22 POF..... KJ

## 2014-12-06 ENCOUNTER — Ambulatory Visit (HOSPITAL_BASED_OUTPATIENT_CLINIC_OR_DEPARTMENT_OTHER): Payer: 59

## 2014-12-06 ENCOUNTER — Other Ambulatory Visit (HOSPITAL_BASED_OUTPATIENT_CLINIC_OR_DEPARTMENT_OTHER): Payer: 59

## 2014-12-06 DIAGNOSIS — Z5112 Encounter for antineoplastic immunotherapy: Secondary | ICD-10-CM

## 2014-12-06 DIAGNOSIS — C8330 Diffuse large B-cell lymphoma, unspecified site: Secondary | ICD-10-CM

## 2014-12-06 DIAGNOSIS — Z5111 Encounter for antineoplastic chemotherapy: Secondary | ICD-10-CM

## 2014-12-06 LAB — CBC WITH DIFFERENTIAL/PLATELET
BASO%: 0.2 % (ref 0.0–2.0)
Basophils Absolute: 0 10*3/uL (ref 0.0–0.1)
EOS%: 0.2 % (ref 0.0–7.0)
Eosinophils Absolute: 0 10*3/uL (ref 0.0–0.5)
HEMATOCRIT: 31.4 % — AB (ref 34.8–46.6)
HGB: 10.1 g/dL — ABNORMAL LOW (ref 11.6–15.9)
LYMPH%: 10.2 % — AB (ref 14.0–49.7)
MCH: 27.1 pg (ref 25.1–34.0)
MCHC: 32.2 g/dL (ref 31.5–36.0)
MCV: 84.2 fL (ref 79.5–101.0)
MONO#: 0.5 10*3/uL (ref 0.1–0.9)
MONO%: 6.1 % (ref 0.0–14.0)
NEUT#: 6.8 10*3/uL — ABNORMAL HIGH (ref 1.5–6.5)
NEUT%: 83.3 % — AB (ref 38.4–76.8)
PLATELETS: 307 10*3/uL (ref 145–400)
RBC: 3.73 10*6/uL (ref 3.70–5.45)
RDW: 16.3 % — ABNORMAL HIGH (ref 11.2–14.5)
WBC: 8.2 10*3/uL (ref 3.9–10.3)
lymph#: 0.8 10*3/uL — ABNORMAL LOW (ref 0.9–3.3)
nRBC: 0 % (ref 0–0)

## 2014-12-06 MED ORDER — SODIUM CHLORIDE 0.9 % IV SOLN: INTRAVENOUS | Status: AC

## 2014-12-06 MED ORDER — VINCRISTINE SULFATE CHEMO INJECTION 1 MG/ML
1.0000 mg | Freq: Once | INTRAVENOUS | Status: AC
  Administered 2014-12-06: 1 mg via INTRAVENOUS
  Filled 2014-12-06: qty 1

## 2014-12-06 MED ORDER — ACETAMINOPHEN 325 MG PO TABS
650.0000 mg | ORAL_TABLET | Freq: Once | ORAL | Status: AC
  Administered 2014-12-06: 650 mg via ORAL

## 2014-12-06 MED ORDER — ONDANSETRON 16 MG/50ML IVPB (CHCC)
16.0000 mg | Freq: Once | INTRAVENOUS | Status: AC
  Administered 2014-12-06: 16 mg via INTRAVENOUS

## 2014-12-06 MED ORDER — SODIUM CHLORIDE 0.9 % IJ SOLN
10.0000 mL | INTRAMUSCULAR | Status: DC | PRN
  Administered 2014-12-06: 10 mL
  Filled 2014-12-06: qty 10

## 2014-12-06 MED ORDER — SODIUM CHLORIDE 0.9 % IV SOLN
675.0000 mg/m2 | Freq: Once | INTRAVENOUS | Status: AC
  Administered 2014-12-06: 1260 mg via INTRAVENOUS
  Filled 2014-12-06: qty 63

## 2014-12-06 MED ORDER — ACETAMINOPHEN 325 MG PO TABS
ORAL_TABLET | ORAL | Status: AC
  Filled 2014-12-06: qty 2

## 2014-12-06 MED ORDER — DIPHENHYDRAMINE HCL 25 MG PO CAPS
ORAL_CAPSULE | ORAL | Status: AC
  Filled 2014-12-06: qty 2

## 2014-12-06 MED ORDER — SODIUM CHLORIDE 0.9 % IV SOLN
375.0000 mg/m2 | Freq: Once | INTRAVENOUS | Status: AC
  Administered 2014-12-06: 700 mg via INTRAVENOUS
  Filled 2014-12-06: qty 70

## 2014-12-06 MED ORDER — ONDANSETRON 16 MG/50ML IVPB (CHCC)
INTRAVENOUS | Status: AC
  Filled 2014-12-06: qty 16

## 2014-12-06 MED ORDER — DEXAMETHASONE SODIUM PHOSPHATE 20 MG/5ML IJ SOLN
20.0000 mg | Freq: Once | INTRAMUSCULAR | Status: AC
  Administered 2014-12-06: 20 mg via INTRAVENOUS

## 2014-12-06 MED ORDER — DOXORUBICIN HCL CHEMO IV INJECTION 2 MG/ML
45.0000 mg/m2 | Freq: Once | INTRAVENOUS | Status: AC
  Administered 2014-12-06: 84 mg via INTRAVENOUS
  Filled 2014-12-06: qty 42

## 2014-12-06 MED ORDER — DIPHENHYDRAMINE HCL 25 MG PO CAPS
50.0000 mg | ORAL_CAPSULE | Freq: Once | ORAL | Status: AC
  Administered 2014-12-06: 50 mg via ORAL

## 2014-12-06 MED ORDER — HEPARIN SOD (PORK) LOCK FLUSH 100 UNIT/ML IV SOLN
500.0000 [IU] | Freq: Once | INTRAVENOUS | Status: AC | PRN
  Administered 2014-12-06: 500 [IU]
  Filled 2014-12-06: qty 5

## 2014-12-06 MED ORDER — DEXAMETHASONE SODIUM PHOSPHATE 20 MG/5ML IJ SOLN
INTRAMUSCULAR | Status: AC
  Filled 2014-12-06: qty 5

## 2014-12-06 NOTE — Patient Instructions (Signed)
[facility] Discharge Instructions for Patients Receiving Chemotherapy  Today you received the following chemotherapy agents: Adriamycin, Vincristine, Cytoxan,  Rituxan, and Methotrexate.  To help prevent nausea and vomiting after your treatment, we encourage you to take your nausea medication as prescribed.   If you develop nausea and vomiting that is not controlled by your nausea medication, call the clinic.   BELOW ARE SYMPTOMS THAT SHOULD BE REPORTED IMMEDIATELY:  *FEVER GREATER THAN 100.5 F  *CHILLS WITH OR WITHOUT FEVER  NAUSEA AND VOMITING THAT IS NOT CONTROLLED WITH YOUR NAUSEA MEDICATION  *UNUSUAL SHORTNESS OF BREATH  *UNUSUAL BRUISING OR BLEEDING  TENDERNESS IN MOUTH AND THROAT WITH OR WITHOUT PRESENCE OF ULCERS  *URINARY PROBLEMS  *BOWEL PROBLEMS  UNUSUAL RASH Items with * indicate a potential emergency and should be followed up as soon as possible.  Feel free to call the clinic you have any questions or concerns. The clinic phone number is [phone].  Methotrexate injection What is this medicine? METHOTREXATE (METH oh TREX ate) is a chemotherapy drug. This medicine affects cells that are rapidly growing, such as cancer cells and cells in your mouth and stomach. It is used to treat many cancers and other medical conditions. It is used for leukemias, lymphomas, breast cancer, lung cancer, head and neck cancers, and other cancers. This medicine also works on the immune system and is commonly used to treat psoriasis and rheumatoid arthritis. This medicine may be used for other purposes; ask your health care provider or pharmacist if you have questions. What should I tell my health care provider before I take this medicine? They need to know if you have any of these conditions: -if you frequently drink alcohol containing drinks -infection (especially a virus infection such as chickenpox, cold sores, or herpes) -immune system problems -kidney  disease -liver disease -low blood counts, like platelets, red bloods, or white blood cells -lung disease -recent or ongoing radiation therapy -an unusual or allergic reaction to methotrexate, benzyl alcohol, other medicines, foods, dyes, or preservatives -pregnant or trying to get pregnant -breast-feeding How should I use this medicine? This drug is given as an injection into a muscle or into a vein. It may also be given into the spinal fluid. It is administered in a hospital or clinic by a specially trained health care professional. Talk to your pediatrician regarding the use of this medicine in children. While this drug may be prescribed for selected conditions, precautions do apply. Overdosage: If you think you have taken too much of this medicine contact a poison control center or emergency room at once. NOTE: This medicine is only for you. Do not share this medicine with others. What if I miss a dose? It is important not to miss your dose. Call your doctor or health care professional if you are unable to keep an appointment. What may interact with this medicine? -antibiotics and other medicines for infections -aspirin and aspirin-like medicines including bismuth subsalicylate (Pepto-Bismol) -cisplatin -dapsone -folic acid in supplements or vitamins -mercaptopurine -NSAIDs, medicines for pain and inflammation, like ibuprofen or naproxen -pemetrexed -phenylbutazone -phenytoin -probenecid -pyrimethamine -theophylline -trimetrexate -vaccines This list may not describe all possible interactions. Give your health care provider a list of all the medicines, herbs, non-prescription drugs, or dietary supplements you use. Also tell them if you smoke, drink alcohol, or use illegal drugs. Some items may interact with your medicine. What should I watch for while using this medicine? Visit your doctor for checks on your progress. You  will need to have regular blood checks during your treatment  to monitor your blood, liver function, and kidney function. This drug may make you feel generally unwell. This is not uncommon, as chemotherapy can affect healthy cells as well as cancer cells. Report any side effects. Continue your course of treatment even though you feel ill unless your doctor tells you to stop. In some cases, you may be given additional medicines to help with side effects. Follow all directions for their use. Call your doctor or health care professional for advice if you get a fever, chills or sore throat, or other symptoms of a cold or flu. Do not treat yourself. This drug decreases your body's ability to fight infections. Try to avoid being around people who are sick. This medicine may increase your risk to bruise or bleed. Call your doctor or health care professional if you notice any unusual bleeding. Be careful brushing and flossing your teeth or using a toothpick because you may get an infection or bleed more easily. If you have any dental work done, tell your dentist you are receiving this medicine. Avoid taking products that contain aspirin, acetaminophen, ibuprofen, naproxen, or ketoprofen unless instructed by your doctor. These medicines may hide a fever. This medicine can make you more sensitive to the sun. Keep out of the sun. If you cannot avoid being in the sun, wear protective clothing and use sunscreen. Do not use sun lamps or tanning beds/booths. Do not treat diarrhea with over the counter products. Contact your doctor if you have diarrhea. To protect your kidneys, drink water or other fluids as directed while you are taking this medicine. Do not drink alcohol-containing drinks while taking this medicine. Both alcohol and the medicine may cause damage to your liver. Men and women must use effective birth control while they are taking this medicine. Do not become pregnant while taking this medicine. Women must continue using effective birth control for 1 full menstrual  cycle after stopping this medicine. Tell your doctor right away if you think that you or your partner might be pregnant. There is a potential for serious side effects to an unborn child. Talk to your health care professional or pharmacist for more information. Do not breast-feed an infant while taking this medicine. Men must continue effective birth control for 3 months after stopping this medicine. What side effects may I notice from receiving this medicine? Side effects that you should report to your doctor or health care professional as soon as possible: -allergic reactions like skin rash, itching or hives, swelling of the face, lips, or tongue -low blood counts - this medicine may decrease the number of white blood cells, red blood cells and platelets. You may be at increased risk for infections and bleeding. -signs of infection - fever or chills, cough, sore throat, pain or difficulty passing urine -signs of decreased platelets or bleeding - bruising, pinpoint red spots on the skin, black, tarry stools, blood in the urine -signs of decreased red blood cells - unusually weak or tired, fainting spells, lightheadedness -breathing problems, like a dry cough -changes in vision -confusion, not alert -diarrhea -mouth or throat sores or ulcers -problems with balance, talking, walking -redness, blistering, peeling or loosening of the skin, including inside the mouth -seizures -trouble passing urine or change in the amount of urine -vomiting -yellowing of the eyes or skin Side effects that usually do not require medical attention (report to your doctor or health care professional if they continue or are  bothersome): -change in skin color -eye irritation -hair loss -headache -loss of appetite -nausea -stomach upset This list may not describe all possible side effects. Call your doctor for medical advice about side effects. You may report side effects to FDA at 1-800-FDA-1088. Where should I  keep my medicine? This drug is given in a hospital or clinic and will not be stored at home. NOTE: This sheet is a summary. It may not cover all possible information. If you have questions about this medicine, talk to your doctor, pharmacist, or health care provider.  2015, Elsevier/Gold Standard. (2008-06-07 11:13:24)    Lumbar Puncture A lumbar puncture, or spinal tap, is a procedure in which a small amount of the fluid that surrounds the brain and spinal cord is removed and examined. The fluid is called the cerebrospinal fluid. This procedure may be done to:   Help diagnose various problems, such as meningitis, encephalitis, multiple sclerosis, and AIDS.   Remove fluid and relieve pressure that occurs with certain types of headaches.   Look for bleeding within the brain and spinal cord areas (central nervous system).   Place medicine into the spinal fluid.  LET [facility] CARE PROVIDER KNOW ABOUT:  Any allergies you have.  All medicines you are taking, including vitamins, herbs, eye drops, creams, and over-the-counter medicines.  Previous problems you or members of your family have had with the use of anesthetics.  Any blood disorders you have.  Previous surgeries you have had.  Medical conditions you have. RISKS AND COMPLICATIONS Generally, this is a safe procedure. However, as with any procedure, complications can occur. Possible complications include:   Spinal headache. This is a severe headache that occurs when there is a leak of spinal fluid. A spinal headache causes discomfort but is not dangerous. If it persists, another procedure may be done to treat the headache.  Bleeding. This most often occurs in people with bleeding disorders. These are disorders in which the blood does not clot normally.   Infection at the insertion site that can spread to the bone or spinal fluid.  Formation of a spinal cord tumor (rare).  Brain herniation or movement of the brain  into the spinal cord (rare).  Inability to move (extremely rare). BEFORE THE PROCEDURE  You may have blood tests done. These tests can help tell how well your kidneys and liver are working. They can also show how well your blood clots.   If you take blood thinners (anticoagulant medicine), ask your health care provider if and when you should stop taking them.   Your health care provider may order a CT scan of your brain.  Make arrangements for someone to drive you home after the procedure.  PROCEDURE  You will be positioned so that the spaces between the bones of the spine (vertebrae) are as wide as possible. This will make it easier to pass the needle into the spinal canal.  Depending on your age and size, you may lie on your side, curled up with your knees under your chin. Or, you may sit with your head resting on a pillow that is placed at waist level.  The skin covering the lower back (or lumbar region) will be cleaned.   The skin may be numbed with medicine.  You may be given pain medicine or a medicine to help you relax (sedative).  A small needle will be inserted in the skin until it enters the space that contains the spinal fluid. The needle will not  enter the spinal cord.   The spinal fluid will be collected into tubes.   The needle will be withdrawn, and a bandage will be placed on the site.  AFTER THE PROCEDURE  You will remain lying down for 1 hour or for as long as your health care provider suggests.   The spinal fluid will be sent to a laboratory to be examined. The results of the examination may be available before you go home.  A test, called a culture, may be taken of the spinal fluid if your health care provider thinks you have an infection. If cultures were taken for exam, the results will usually be available in a couple of days.  Document Released: 11/27/2000 Document Revised: 09/20/2013 Document Reviewed: 08/07/2013 [facility] Patient  Information 2015 [place], [place]. This information is not intended to replace advice given to you by your health care provider. Make sure you discuss any questions you have with your health care provider.

## 2014-12-08 ENCOUNTER — Ambulatory Visit (HOSPITAL_BASED_OUTPATIENT_CLINIC_OR_DEPARTMENT_OTHER): Payer: 59

## 2014-12-08 DIAGNOSIS — C8330 Diffuse large B-cell lymphoma, unspecified site: Secondary | ICD-10-CM

## 2014-12-08 DIAGNOSIS — Z5189 Encounter for other specified aftercare: Secondary | ICD-10-CM

## 2014-12-08 MED ORDER — PEGFILGRASTIM INJECTION 6 MG/0.6ML ~~LOC~~
6.0000 mg | PREFILLED_SYRINGE | Freq: Once | SUBCUTANEOUS | Status: AC
  Administered 2014-12-08: 6 mg via SUBCUTANEOUS

## 2014-12-10 ENCOUNTER — Encounter: Payer: Self-pay | Admitting: *Deleted

## 2014-12-10 ENCOUNTER — Telehealth: Payer: Self-pay | Admitting: *Deleted

## 2014-12-10 NOTE — Telephone Encounter (Signed)
Faxed out of work letter.  

## 2015-01-04 ENCOUNTER — Other Ambulatory Visit (HOSPITAL_BASED_OUTPATIENT_CLINIC_OR_DEPARTMENT_OTHER): Payer: 59

## 2015-01-04 ENCOUNTER — Ambulatory Visit (HOSPITAL_COMMUNITY)
Admission: RE | Admit: 2015-01-04 | Discharge: 2015-01-04 | Disposition: A | Discharge status: Home / Self Care | Payer: 59 | Source: Ambulatory Visit | Attending: Hematology and Oncology | Admitting: Hematology and Oncology

## 2015-01-04 DIAGNOSIS — C73 Malignant neoplasm of thyroid gland: Secondary | ICD-10-CM | POA: Diagnosis not present

## 2015-01-04 DIAGNOSIS — C8330 Diffuse large B-cell lymphoma, unspecified site: Secondary | ICD-10-CM | POA: Insufficient documentation

## 2015-01-04 LAB — CBC WITH DIFFERENTIAL/PLATELET
BASO%: 0.7 % (ref 0.0–2.0)
Basophils Absolute: 0 10*3/uL (ref 0.0–0.1)
EOS%: 2.1 % (ref 0.0–7.0)
Eosinophils Absolute: 0.1 10*3/uL (ref 0.0–0.5)
HCT: 33 % — ABNORMAL LOW (ref 34.8–46.6)
HEMOGLOBIN: 10.8 g/dL — AB (ref 11.6–15.9)
LYMPH%: 25.2 % (ref 14.0–49.7)
MCH: 28.3 pg (ref 25.1–34.0)
MCHC: 32.7 g/dL (ref 31.5–36.0)
MCV: 86.6 fL (ref 79.5–101.0)
MONO#: 0.8 10*3/uL (ref 0.1–0.9)
MONO%: 28.7 % — ABNORMAL HIGH (ref 0.0–14.0)
NEUT%: 43.3 % (ref 38.4–76.8)
NEUTROS ABS: 1.2 10*3/uL — AB (ref 1.5–6.5)
Platelets: 340 10*3/uL (ref 145–400)
RBC: 3.81 10*6/uL (ref 3.70–5.45)
RDW: 17.4 % — AB (ref 11.2–14.5)
WBC: 2.8 10*3/uL — ABNORMAL LOW (ref 3.9–10.3)
lymph#: 0.7 10*3/uL — ABNORMAL LOW (ref 0.9–3.3)

## 2015-01-04 LAB — LACTATE DEHYDROGENASE (CC13): LDH: 149 U/L (ref 125–245)

## 2015-01-04 LAB — COMPREHENSIVE METABOLIC PANEL (CC13)
ALT: 11 U/L (ref 0–55)
AST: 15 U/L (ref 5–34)
Albumin: 3.5 g/dL (ref 3.5–5.0)
Alkaline Phosphatase: 79 U/L (ref 40–150)
Anion Gap: 6 mEq/L (ref 3–11)
BUN: 7.8 mg/dL (ref 7.0–26.0)
CHLORIDE: 107 meq/L (ref 98–109)
CO2: 28 mEq/L (ref 22–29)
Calcium: 8.5 mg/dL (ref 8.4–10.4)
Creatinine: 0.8 mg/dL (ref 0.6–1.1)
EGFR: >90 mL/min/{1.73_m2} (ref >90)
Glucose: 106 mg/dl (ref 70–140)
Potassium: 4.1 mEq/L (ref 3.5–5.1)
SODIUM: 141 meq/L (ref 136–145)
TOTAL PROTEIN: 6.2 g/dL — AB (ref 6.4–8.3)
Total Bilirubin: 0.48 mg/dL (ref 0.20–1.20)

## 2015-01-04 LAB — GLUCOSE, CAPILLARY: Glucose-Capillary: 106 mg/dL — ABNORMAL HIGH (ref 70–99)

## 2015-01-04 MED ORDER — FLUDEOXYGLUCOSE F - 18 (FDG) INJECTION
8.4400 | Freq: Once | INTRAVENOUS | Status: AC | PRN
  Administered 2015-01-04: 8.44 via INTRAVENOUS

## 2015-01-07 ENCOUNTER — Ambulatory Visit (HOSPITAL_BASED_OUTPATIENT_CLINIC_OR_DEPARTMENT_OTHER): Payer: 59 | Admitting: Hematology and Oncology

## 2015-01-07 ENCOUNTER — Telehealth: Payer: Self-pay | Admitting: Hematology and Oncology

## 2015-01-07 ENCOUNTER — Encounter: Payer: Self-pay | Admitting: Hematology and Oncology

## 2015-01-07 VITALS — BP 134/77 | HR 96 | Temp 98.5°F | Resp 20 | Ht 63.0 in | Wt 171.3 lb

## 2015-01-07 DIAGNOSIS — D72819 Decreased white blood cell count, unspecified: Secondary | ICD-10-CM

## 2015-01-07 DIAGNOSIS — M25511 Pain in right shoulder: Secondary | ICD-10-CM | POA: Insufficient documentation

## 2015-01-07 DIAGNOSIS — T451X5A Adverse effect of antineoplastic and immunosuppressive drugs, initial encounter: Secondary | ICD-10-CM

## 2015-01-07 DIAGNOSIS — D630 Anemia in neoplastic disease: Secondary | ICD-10-CM

## 2015-01-07 DIAGNOSIS — C8330 Diffuse large B-cell lymphoma, unspecified site: Secondary | ICD-10-CM

## 2015-01-07 DIAGNOSIS — D701 Agranulocytosis secondary to cancer chemotherapy: Secondary | ICD-10-CM

## 2015-01-07 DIAGNOSIS — Z85850 Personal history of malignant neoplasm of thyroid: Secondary | ICD-10-CM

## 2015-01-07 DIAGNOSIS — G620 Drug-induced polyneuropathy: Secondary | ICD-10-CM

## 2015-01-07 NOTE — Assessment & Plan Note (Signed)
She had achieved complete remission. I reviewed with her NCCN guidelines. There is role for radiation therapy. I will present her case at the next tumor board and refer her to radiation oncology for further management.

## 2015-01-07 NOTE — Assessment & Plan Note (Signed)
This is likely due to recent treatment. The patient denies recent history of fevers, cough, chills, diarrhea or dysuria. She is asymptomatic from the leukopenia. I will observe for now.    

## 2015-01-07 NOTE — Telephone Encounter (Signed)
gv adn printed appt sched and avs for pt for Feb thru June Emailed Santiago Glad to sched pt.Maria KitchenMarland KitchenMarland Simmons

## 2015-01-07 NOTE — Assessment & Plan Note (Signed)
This has improved since we reduced the dose of chemotherapy.

## 2015-01-07 NOTE — Assessment & Plan Note (Signed)
This is not consistent with metastatic disease. Recommend MRI for evaluation and she agreed.

## 2015-01-07 NOTE — Assessment & Plan Note (Signed)
This is likely due to recent treatment. The patient denies recent history of bleeding such as epistaxis, hematuria or hematochezia. She is asymptomatic from the anemia. I will observe for now.   

## 2015-01-07 NOTE — Assessment & Plan Note (Signed)
She is on chronic suppressive therapy with thyroid medicine.

## 2015-01-07 NOTE — Progress Notes (Signed)
[facility] OFFICE PROGRESS NOTE  Patient Care Team: Jacelyn Pi, MD as PCP - General (Endocrinology) Lyman Speller, MD as Consulting Physician (Gynecology) Heath Lark, MD as Consulting Physician (Hematology and Oncology)  SUMMARY OF ONCOLOGIC HISTORY: Oncology History   DLBCL (diffuse large B cell lymphoma)   Staging form: Lymphoid Neoplasms, AJCC 6th Edition     Clinical stage from 11/15/2014: Stage II - Signed by Heath Lark, MD on 11/15/2014       DLBCL (diffuse large B cell lymphoma)   09/14/2014 Imaging PET/CT scan showed Hypermetabolic bilateral cervical lymphadenopathy, left side greater than right, and sub-cm hypermetabolic left subpectoral lymph node. Hypermetabolic activity throughout the lingular and palatine tonsils in Waldeyer's ring   10/12/2014 Procedure Biopsy of the left neck lymph node came back positive for diffuse large B-cell lymphoma.Accession: [id number]   10/24/2014 Bone Marrow Biopsy Bone marrow biopsy was negative.Cytogenetics were normal   10/24/2014 Imaging Echocardiogram showed normal ejection fraction   10/25/2014 - 12/06/2014 Chemotherapy She received 3 cyles of R CHOP chemotherapy. Further doses of R CHOP chemotherapy was reduced due to recent neutropenic fever with cycle 1 of treatment.   11/01/2014 Adverse Reaction She was seen in the emergency Department for possible neutropenic fever. Cultures were negative. She received antibiotic therapy.   11/15/2014 - 12/06/2014 Chemotherapy Intrathecal chemotherapy is added due to risk of CNS relapse   01/04/2015 Imaging PET CT scan show complete remission from lymphadenopathy. Incidental finding of right shoulder arthropathy    INTERVAL HISTORY: Please see below for problem oriented charting. She feels well. She complained of right shoulder pain. Recently, she had nosebleed, resolved conservatively.  REVIEW OF SYSTEMS:   Constitutional: Denies fevers, chills or abnormal weight loss Eyes:  Denies blurriness of vision Ears, nose, mouth, throat, and face: Denies mucositis or sore throat Respiratory: Denies cough, dyspnea or wheezes Cardiovascular: Denies palpitation, chest discomfort or lower extremity swelling Gastrointestinal:  Denies nausea, heartburn or change in bowel habits Skin: Denies abnormal skin rashes Lymphatics: Denies new lymphadenopathy or easy bruising Neurological:Denies numbness, tingling or new weaknesses Behavioral/Psych: Mood is stable, no new changes  All other systems were reviewed with the patient and are negative.  I have reviewed the past medical history, past surgical history, social history and family history with the patient and they are unchanged from previous note.  ALLERGIES:  is allergic to metformin and related; tape; levaquin; and penicillins.  MEDICATIONS:  Current Outpatient Prescriptions  Medication Sig Dispense Refill  . aspirin EC 81 MG tablet Take 81 mg by mouth every morning.     . Biotin 5000 MCG TABS Take 1 tablet by mouth every morning.    . Cyanocobalamin (VITAMIN B 12 PO) Take 1 tablet by mouth every morning.     [name] levothyroxine (SYNTHROID, LEVOTHROID) 137 MCG tablet Take 137 mcg by mouth daily before breakfast.    . lidocaine-prilocaine (EMLA) cream Apply cream to port-a-cath 1-2 hours prior to access. 30 g 3  . Multiple Vitamin (MULITIVITAMIN WITH MINERALS) TABS Take 1 tablet by mouth every morning.     . Vitamin D, Cholecalciferol, 1000 UNITS TABS Take 2,000 Int'l Units by mouth daily at 12 noon.     . [DISCONTINUED] calcium-vitamin D (OSCAL WITH D) 500-200 MG-UNIT per tablet Take 1 tablet by mouth daily.     No current facility-administered medications for this visit.   Facility-Administered Medications Ordered in Other Visits  Medication Dose Route Frequency Provider Last Rate Last Dose  . 0.9 %  sodium chloride infusion   Intravenous Once Aasim Marla Roe, MD   Stopped at 10/25/14 1925  . sodium chloride 0.9 %  injection 10 mL  10 mL Intracatheter PRN Aasim Marla Roe, MD   10 mL at 10/25/14 1925    PHYSICAL EXAMINATION: ECOG PERFORMANCE STATUS: 0 - Asymptomatic  Filed Vitals:   01/07/15 1229  BP: 134/77  Pulse: 96  Temp: 98.5 F (36.9 C)  Resp: 20   Filed Weights   01/07/15 1229  Weight: 171 lb 4.8 oz (77.701 kg)    GENERAL:alert, no distress and comfortable SKIN: skin color, texture, turgor are normal, no rashes or significant lesions EYES: normal, Conjunctiva are pink and non-injected, sclera clear Musculoskeletal:no cyanosis of digits and no clubbing  NEURO: alert & oriented x 3 with fluent speech, no focal motor/sensory deficits  LABORATORY DATA:  I have reviewed the data as listed    Component Value Date/Time   NA 141 01/04/2015 0907   NA 140 11/01/2014 1424   K 4.1 01/04/2015 0907   K 4.2 11/01/2014 1424   CL 99 11/01/2014 1424   CL 102 08/31/2012 1336   CO2 28 01/04/2015 0907   CO2 26 11/01/2014 1424   GLUCOSE 106 01/04/2015 0907   GLUCOSE 101* 11/01/2014 1424   GLUCOSE 95 08/31/2012 1336   BUN 7.8 01/04/2015 0907   BUN 9 11/01/2014 1424   CREATININE 0.8 01/04/2015 0907   CREATININE 0.63 11/01/2014 1424   CALCIUM 8.5 01/04/2015 0907   CALCIUM 9.0 11/01/2014 1424   PROT 6.2* 01/04/2015 0907   PROT 6.7 11/01/2014 1424   ALBUMIN 3.5 01/04/2015 0907   ALBUMIN 3.5 11/01/2014 1424   AST 15 01/04/2015 0907   AST 9 11/01/2014 1424   ALT 11 01/04/2015 0907   ALT 9 11/01/2014 1424   ALKPHOS 79 01/04/2015 0907   ALKPHOS 86 11/01/2014 1424   BILITOT 0.48 01/04/2015 0907   BILITOT 0.6 11/01/2014 1424   GFRNONAA >90 11/01/2014 1424   GFRAA >90 11/01/2014 1424    No results found for: SPEP, UPEP  Lab Results  Component Value Date   WBC 2.8* 01/04/2015   NEUTROABS 1.2* 01/04/2015   HGB 10.8* 01/04/2015   HCT 33.0* 01/04/2015   MCV 86.6 01/04/2015   PLT 340 01/04/2015      Chemistry      Component Value Date/Time   NA 141 01/04/2015 0907   NA 140  11/01/2014 1424   K 4.1 01/04/2015 0907   K 4.2 11/01/2014 1424   CL 99 11/01/2014 1424   CL 102 08/31/2012 1336   CO2 28 01/04/2015 0907   CO2 26 11/01/2014 1424   BUN 7.8 01/04/2015 0907   BUN 9 11/01/2014 1424   CREATININE 0.8 01/04/2015 0907   CREATININE 0.63 11/01/2014 1424      Component Value Date/Time   CALCIUM 8.5 01/04/2015 0907   CALCIUM 9.0 11/01/2014 1424   ALKPHOS 79 01/04/2015 0907   ALKPHOS 86 11/01/2014 1424   AST 15 01/04/2015 0907   AST 9 11/01/2014 1424   ALT 11 01/04/2015 0907   ALT 9 11/01/2014 1424   BILITOT 0.48 01/04/2015 0907   BILITOT 0.6 11/01/2014 1424       RADIOGRAPHIC STUDIES: I reviewed the PET CT scan with the patient I have personally reviewed the radiological images as listed and agreed with the findings in the report.  ASSESSMENT & PLAN:  DLBCL (diffuse large B cell lymphoma) She had achieved complete remission. I reviewed with her  NCCN guidelines. There is role for radiation therapy. I will present her case at the next tumor board and refer her to radiation oncology for further management.   Leukopenia due to antineoplastic chemotherapy This is likely due to recent treatment. The patient denies recent history of fevers, cough, chills, diarrhea or dysuria. She is asymptomatic from the leukopenia. I will observe for now.    Neuropathy due to chemotherapeutic drug This has improved since we reduced the dose of chemotherapy.   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 The patient denies recent history of bleeding such as epistaxis, hematuria or hematochezia. She is asymptomatic from the anemia. I will observe for now.   Hx of papillary thyroid carcinoma She is on chronic suppressive therapy with thyroid medicine.   Right shoulder pain This is not consistent with metastatic disease. Recommend MRI for evaluation and she agreed.    Orders Placed This Encounter  Procedures  . MR Shoulder Right W Wo  Contrast    Standing Status: Future     Number of Occurrences:      Standing Expiration Date: 03/07/2016    Order Specific Question:  Reason for Exam (SYMPTOM  OR DIAGNOSIS REQUIRED)    Answer:  severe right shoulder pain, abnormal MRI    Order Specific Question:  Preferred imaging location?    Answer:  [facility]    Order Specific Question:  Does the patient have a pacemaker or implanted devices?    Answer:  No    Order Specific Question:  What is the patient's sedation requirement?    Answer:  No Sedation  . Ambulatory referral to Radiation Oncology    Referral Priority:  Routine    Referral Type:  Consultation    Referral Reason:  Specialty Services Required    Referred to Provider:  Eppie Gibson, MD    Requested Specialty:  Radiation Oncology    Number of Visits Requested:  1   All questions were answered. The patient knows to call the clinic with any problems, questions or concerns. No barriers to learning was detected. I spent 40 minutes counseling the patient face to face. The total time spent in the appointment was 60 minutes and more than 50% was on counseling and review of test results     [facility], [place], MD 01/07/2015 1:26 PM

## 2015-01-09 ENCOUNTER — Telehealth: Payer: Self-pay | Admitting: *Deleted

## 2015-01-09 ENCOUNTER — Telehealth: Payer: Self-pay | Admitting: Hematology and Oncology

## 2015-01-09 NOTE — Telephone Encounter (Signed)
adjust pt appt per staff msg from [place]...per msg pt is ok and aware and will ck mychart

## 2015-01-09 NOTE — Telephone Encounter (Signed)
Spoke with patient, introduced my self as the H&N Navigator, my roll as a member of her Care Team.  I asked about attending next week Wednesday's H&N [place] to see Dr. Isidore Moos, PT and Nutrition.  I explained clinic purpose and format.  She stated awareness of [place] per earlier discussion with Dr. Alvy Bimler, her willingness to attend.  She understands flush appt will need to be rescheduled to accommodate [place], indicated she utilizes MyChart to check for appts.  I provided her my phone number, encouraged her to call me with questions prior to my contact next Tuesday.  She verbalized understanding.  Gayleen Orem, RN, BSN, [facility] [place] [phone]

## 2015-01-11 ENCOUNTER — Ambulatory Visit (INDEPENDENT_AMBULATORY_CARE_PROVIDER_SITE_OTHER): Payer: 59 | Admitting: Nurse Practitioner

## 2015-01-11 ENCOUNTER — Encounter: Payer: Self-pay | Admitting: Nurse Practitioner

## 2015-01-11 VITALS — BP 130/84 | HR 76 | Ht 63.75 in | Wt 170.0 lb

## 2015-01-11 DIAGNOSIS — Z0000 Encounter for general adult medical examination without abnormal findings: Secondary | ICD-10-CM

## 2015-01-11 DIAGNOSIS — Z01419 Encounter for gynecological examination (general) (routine) without abnormal findings: Secondary | ICD-10-CM

## 2015-01-11 DIAGNOSIS — R8290 Unspecified abnormal findings in urine: Secondary | ICD-10-CM

## 2015-01-11 LAB — POCT URINALYSIS DIPSTICK
Bilirubin, UA: NEGATIVE
Glucose, UA: NEGATIVE
Ketones, UA: NEGATIVE
Nitrite, UA: NEGATIVE
Protein, UA: NEGATIVE
Urobilinogen, UA: NEGATIVE
pH, UA: 5

## 2015-01-11 NOTE — Patient Instructions (Signed)

## 2015-01-11 NOTE — Progress Notes (Signed)
49 y.o. [id number] Divorced  African American Fe here for annual exam.  She has had several major health issues last year.  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  She has completed chemotherapy that was given intrathecal.  She has had several high fever events, leukopenia and transfusion with this .  The latest PET scan from 01/04/15 reveals prior adenopathy in the neck to be resolved.  Chest, abdomen, pelvis is all normal.  There is one abnormal area in the right humeral head with uptake value of 5.8.  There is concern that this is a metasis - but will get MR of the right shoulder next Wednesday.  She is hopeful this is just arthritis.  Loma Sousa her son has moved back home and that has been financially helpful.  He is also taking her to treatment.  She understands the radiation that starts next Wednesday may be 5 days a week for several weeks.  Patient's last menstrual period was 04/13/2000.          Sexually active: No.  The current method of family planning is status post hysterectomy.    Exercising: Yes.    cardio and light weights at home Smoker:  no  Health Maintenance: Pap:  Hysterectomy MMG:  06/11/14, 3D, Bi-Rads 1:  Negative  Colonoscopy:  07/2006, repeat in 3 years TDaP:  07/2008 Labs:  Dr. Chalmers Cater and [place]   Urine:  1+ leuk's, trace RBC     reports that she has never smoked. She has never used smokeless tobacco. She reports that she does not drink alcohol or use illicit drugs.  Past Medical History  Diagnosis Date  . Cancer   . Diabetes mellitus   . Hypercholesterolemia   . Thyroid cancer   . Hypothyroidism   . Wears glasses   . PONV (postoperative nausea and vomiting)   . DLBCL (diffuse large B cell lymphoma)   . Neutropenic fever 11/01/2014    Past Surgical History  Procedure Laterality Date  . 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  . Tubal ligation  1990  . Abdominal  hysterectomy  2000  . Colonoscopy    . Lymph node biopsy Left 10/12/2014    Procedure: excisional biopsy deep left neck lymph node;  Surgeon: Fanny Skates, MD;  Location: [facility];  Service: General;  Laterality: Left;  . Laparoscopic gastric banding with hiatal hernia repair  05/28/2009  . Portacath placement Right 10/24/2014    Procedure: INSERTION PORT-A-CATH WITH ULTRASOUND GUIDANCE;  Surgeon: Fanny Skates, MD;  Location: [facility];  Service: General;  Laterality: Right;    Current Outpatient Prescriptions  Medication Sig Dispense Refill  . aspirin EC 81 MG tablet Take 81 mg by mouth every morning.     . Biotin 5000 MCG TABS Take 1 tablet by mouth every morning.    . Cyanocobalamin (VITAMIN B 12 PO) Take 1 tablet by mouth every morning.     [name] levothyroxine (SYNTHROID, LEVOTHROID) 137 MCG tablet Take 137 mcg by mouth daily before breakfast.    . lidocaine-prilocaine (EMLA) cream Apply cream to port-a-cath 1-2 hours prior to access. 30 g 3  . Multiple Vitamin (MULITIVITAMIN WITH MINERALS) TABS Take 1 tablet by mouth every morning.     . Vitamin D, Cholecalciferol, 1000 UNITS TABS Take 2,000 Int'l Units by mouth daily at 12 noon.     . [DISCONTINUED] calcium-vitamin D (OSCAL  WITH D) 500-200 MG-UNIT per tablet Take 1 tablet by mouth daily.     No current facility-administered medications for this visit.   Facility-Administered Medications Ordered in Other Visits  Medication Dose Route Frequency Provider Last Rate Last Dose  . 0.9 %  sodium chloride infusion   Intravenous Once Aasim Marla Roe, MD   Stopped at 10/25/14 1925  . sodium chloride 0.9 % injection 10 mL  10 mL Intracatheter PRN Aasim Marla Roe, MD   10 mL at 10/25/14 1925    Family History  Problem Relation Age of Onset  . Colon cancer Mother   . Cancer Mother     colon ca  . Prostate cancer Brother   . Cancer Father     prostate  . Hypertension Father   . Hyperlipidemia  Father   . Stroke Father   . Cancer Paternal Grandmother     female organ ca    ROS:  Pertinent items are noted in HPI.  Otherwise, a comprehensive ROS was negative.  Exam:   BP 130/84 mmHg  Pulse 76  Ht 5' 3.75" (1.619 m)  Wt 170 lb (77.111 kg)  BMI 29.42 kg/m2  LMP 04/13/2000 Height: 5' 3.75" (161.9 cm) Ht Readings from Last 3 Encounters:  01/11/15 5' 3.75" (1.619 m)  01/07/15 5\' 3"  (1.6 m)  11/15/14 5\' 3"  (1.6 m)    General appearance: alert, cooperative and appears stated age Head: Normocephalic, without obvious abnormality, atraumatic Neck: no adenopathy, supple, symmetrical, trachea midline and thyroid normal to inspection and palpation Lungs: clear to auscultation bilaterally Breasts: normal appearance, no masses or tenderness, breast reduction scars Heart: regular rate and rhythm Abdomen: soft, non-tender; no masses,  no organomegaly Extremities: extremities normal, atraumatic, no cyanosis or edema Skin: Skin color, texture, turgor normal. No rashes or lesions Lymph nodes: Cervical - shotty nodes, supraclavicular, and axillary nodes normal. No abnormal inguinal nodes palpated Neurologic: Grossly normal   Pelvic: External genitalia:  no lesions              Urethra:  normal appearing urethra with no masses, tenderness or lesions              Bartholin's and Skene's: normal                 Vagina: normal appearing vagina with normal color and discharge, no lesions              Cervix: absent              Pap taken: No. Bimanual Exam:  Uterus:  uterus absent              Adnexa: no mass, fullness, tenderness               Rectovaginal: Confirms               Anus:  normal sphincter tone, no lesions  Chaperone present:  no  A:  Well Woman with normal exam  S/P TVH secondary to DUB 04/2000  S/P thyroidectomy secondary to cancer 08/17/2006 - treated with RAI 09/2006  S/P lap band and hernia repair 05/28/09  History of DM - off med's since 2010  New diagnosis of  DLBCL - diffuse large B cell Lymphoma followed Oncology and treated with intrathecal chemo 12/3 - 12-12/24/15  R/O UTI - asymptomatic    Plan: Health and wellness pertinent to exam  Pap smear not taken today  Mammogram is due 6/16  Follow with urine  cuture  Counseled on breast self exam, mammography screening, adequate intake of calcium and vitamin D, diet and exercise, Kegel's exercises return annually or prn  An After Visit Summary was printed and given to the patient.

## 2015-01-12 LAB — URINALYSIS, MICROSCOPIC ONLY
BACTERIA UA: NONE SEEN
CASTS: NONE SEEN
Crystals: NONE SEEN

## 2015-01-13 LAB — URINE CULTURE
COLONY COUNT: NO GROWTH
ORGANISM ID, BACTERIA: NO GROWTH

## 2015-01-13 NOTE — Progress Notes (Signed)
Reviewed personally.  M. Suzanne Martel Galvan, MD.  

## 2015-01-15 ENCOUNTER — Telehealth: Payer: Self-pay | Admitting: *Deleted

## 2015-01-15 ENCOUNTER — Encounter: Payer: Self-pay | Admitting: Radiation Oncology

## 2015-01-15 NOTE — Telephone Encounter (Signed)
Called patient to see if she had any questions before coming to [facility] tomorrow.  LVM which included time of first appt for flush at 10:30 and request for return call should she have questions.  Gayleen Orem, RN, BSN, [place] at [place] [phone]

## 2015-01-15 NOTE — Progress Notes (Signed)
Head and Neck Cancer Location of Tumor / Histology: left neck  Patient presented 6 months ago with symptoms of: fullness, swelling and palpable lymph nodes in left neck  Biopsies of left jugular lymph node (if applicable) revealed:  62/37/62 Diagnosis Lymph node for lymphoma, left jugular - DIFFUSE LARGE B-CELL LYMPHOMA. - SEE ONCOLOGY TABLE. Histologic type: Non-Hodgkin lymphoma, diffuse large cell type.  Nutrition Status Yes No Comments  Weight changes? []  [x]    Swallowing concerns? []  [x]    PEG? []  [x]     Referrals Yes No Comments  Social Work? []  [x]    Dentistry? []  [x]    Swallowing therapy? []  [x]    Nutrition? []  [x]    Med/Onc? [x]  []  Being followed by Dr Alvy Bimler for lymphoma   Safety Issues Yes No Comments  Prior radiation? [x]  []  Radioactive iodine for thyroid cancer 2007  Pacemaker/ICD? []  [x]    Possible current pregnancy? []  [x]    Is the patient on methotrexate? []  [x]     Tobacco/Marijuana/Snuff/ETOH use: never smoker, no drug or alcohol use recorded  Past/Anticipated interventions by otolaryngology, if any: none  Past/Anticipated interventions by medical oncology, if any: Dr Alvy Bimler : 10/25/2014 - 12/06/2014 Chemotherapy She received 3 cyles of R CHOP chemotherapy. Further doses of R CHOP chemotherapy was reduced due to recent neutropenic fever with cycle 1 of treatment.    11/01/2014 Adverse Reaction She was seen in the emergency Department for possible neutropenic fever. Cultures were negative. She received antibiotic therapy.   11/15/2014 - 12/06/2014 Chemotherapy Intrathecal chemotherapy is added due to risk of CNS relapse   01/04/2015 Imaging PET CT scan show complete remission from lymphadenopathy. 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

## 2015-01-16 ENCOUNTER — Ambulatory Visit
Admission: RE | Admit: 2015-01-16 | Discharge: 2015-01-16 | Disposition: A | Discharge status: Home / Self Care | Payer: 59 | Source: Ambulatory Visit | Attending: Radiation Oncology | Admitting: Radiation Oncology

## 2015-01-16 ENCOUNTER — Ambulatory Visit: Admission: RE | Admit: 2015-01-16 | Payer: 59 | Source: Ambulatory Visit | Admitting: Radiation Oncology

## 2015-01-16 ENCOUNTER — Ambulatory Visit (HOSPITAL_BASED_OUTPATIENT_CLINIC_OR_DEPARTMENT_OTHER): Payer: 59

## 2015-01-16 ENCOUNTER — Encounter: Payer: Self-pay | Admitting: Hematology and Oncology

## 2015-01-16 ENCOUNTER — Ambulatory Visit (HOSPITAL_BASED_OUTPATIENT_CLINIC_OR_DEPARTMENT_OTHER): Payer: 59 | Admitting: Hematology and Oncology

## 2015-01-16 ENCOUNTER — Ambulatory Visit (HOSPITAL_COMMUNITY)
Admission: RE | Admit: 2015-01-16 | Discharge: 2015-01-16 | Disposition: A | Discharge status: Home / Self Care | Payer: 59 | Source: Ambulatory Visit | Attending: Hematology and Oncology | Admitting: Hematology and Oncology

## 2015-01-16 ENCOUNTER — Encounter: Payer: 59 | Admitting: Nutrition

## 2015-01-16 ENCOUNTER — Encounter: Payer: Self-pay | Admitting: *Deleted

## 2015-01-16 ENCOUNTER — Encounter: Payer: Self-pay | Admitting: Radiation Oncology

## 2015-01-16 ENCOUNTER — Telehealth: Payer: Self-pay | Admitting: Hematology and Oncology

## 2015-01-16 VITALS — BP 126/77 | HR 79 | Temp 98.1°F | Resp 20 | Ht 63.0 in | Wt 170.7 lb

## 2015-01-16 VITALS — BP 125/68 | HR 75 | Temp 98.1°F

## 2015-01-16 DIAGNOSIS — C8330 Diffuse large B-cell lymphoma, unspecified site: Secondary | ICD-10-CM

## 2015-01-16 DIAGNOSIS — M25511 Pain in right shoulder: Secondary | ICD-10-CM | POA: Insufficient documentation

## 2015-01-16 DIAGNOSIS — D630 Anemia in neoplastic disease: Secondary | ICD-10-CM

## 2015-01-16 DIAGNOSIS — C73 Malignant neoplasm of thyroid gland: Secondary | ICD-10-CM | POA: Diagnosis not present

## 2015-01-16 DIAGNOSIS — Z452 Encounter for adjustment and management of vascular access device: Secondary | ICD-10-CM

## 2015-01-16 DIAGNOSIS — Z95828 Presence of other vascular implants and grafts: Secondary | ICD-10-CM

## 2015-01-16 MED ORDER — HEPARIN SOD (PORK) LOCK FLUSH 100 UNIT/ML IV SOLN
500.0000 [IU] | Freq: Once | INTRAVENOUS | Status: AC
  Administered 2015-01-16: 500 [IU] via INTRAVENOUS
  Filled 2015-01-16: qty 5

## 2015-01-16 MED ORDER — SODIUM CHLORIDE 0.9 % IJ SOLN
10.0000 mL | INTRAMUSCULAR | Status: DC | PRN
  Administered 2015-01-16: 10 mL via INTRAVENOUS
  Filled 2015-01-16: qty 10

## 2015-01-16 MED ORDER — GADOBENATE DIMEGLUMINE 529 MG/ML IV SOLN
15.0000 mL | Freq: Once | INTRAVENOUS | Status: AC | PRN
  Administered 2015-01-16: 15 mL via INTRAVENOUS

## 2015-01-16 NOTE — Progress Notes (Signed)
Please see the Nurse Progress Note in the MD Initial Consult Encounter for this patient. 

## 2015-01-16 NOTE — Patient Instructions (Signed)

## 2015-01-16 NOTE — Progress Notes (Signed)
Radiation Oncology         [phone] ________________________________  Initial outpatient Consultation  Name: Maria Simmons MRN: [id number]  Date: 01/16/2015  DOB: 1965-12-18  IH:[id number], MD  Heath Lark, MD   REFERRING PHYSICIAN: Heath Lark, MD  DIAGNOSIS:  Stage IIA DLBCL (diffuse large B cell lymphoma)   Staging form: Lymphoid Neoplasms, AJCC 6th Edition     Clinical stage from 11/15/2014: Stage II - Signed by Heath Lark, MD on 11/15/2014     ICD-9-CM ICD-10-CM   1. DLBCL (diffuse large B cell lymphoma) 202.80 C83.30     HISTORY OF PRESENT ILLNESS::Maria Simmons is a 49 y.o. female who presented with tension and swelling in her left neck. No fevers or weight loss.  Mild night sweats, non-drenching.    Her initial PET on 09-14-14, reviewed in tumor board this AM, revealed involvement of Waldeyer's ring, bilateral neck nodes, and possibly a left subpectoral node.  Biopsy of L neck node revealed DLBCL.   BM bx negative. She received 3 cycles RCHOP with some dose reduction due to  Neutropenic fever ; also, IT methotrexate was given;  Completed chemo on 12-06-14. PET on 1/22 shows complete response. But, a new lesion in her right shoulder (humerus) was noted and is associated w/ pain. MRI pending for tonight to work this up.  She works as a Chief of Staff.   PREVIOUS RADIATION THERAPY: Yes Radioactive Iodine for thyroid cancer  PAST MEDICAL HISTORY:  has a past medical history of Diabetes mellitus; Hypercholesterolemia; Thyroid cancer; Hypothyroidism; Wears glasses; PONV (postoperative nausea and vomiting); DLBCL (diffuse large B cell lymphoma); Neutropenic fever (11/01/2014); and Cancer (2007).    PAST SURGICAL HISTORY: Past Surgical History  Procedure Laterality Date  . 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  . Tubal ligation  1990  . Abdominal hysterectomy  2000  . Colonoscopy    . Lymph  node biopsy Left 10/12/2014    Procedure: excisional biopsy deep left neck lymph node;  Surgeon: Fanny Skates, MD;  Location: [facility];  Service: General;  Laterality: Left;  . Laparoscopic gastric banding with hiatal hernia repair  05/28/2009  . Portacath placement Right 10/24/2014    Procedure: INSERTION PORT-A-CATH WITH ULTRASOUND GUIDANCE;  Surgeon: Fanny Skates, MD;  Location: [facility];  Service: General;  Laterality: Right;    FAMILY HISTORY: family history includes Cancer in her father, mother, and paternal grandmother; Colon cancer in her mother; Hyperlipidemia in her father; Hypertension in her father; Prostate cancer in her father; Stroke in her father.  SOCIAL HISTORY:  reports that she has never smoked. She has never used smokeless tobacco. She reports that she does not drink alcohol or use illicit drugs.  ALLERGIES: Metformin and related; Tape; Levaquin; and Penicillins  MEDICATIONS:  Current Outpatient Prescriptions  Medication Sig Dispense Refill  . aspirin EC 81 MG tablet Take 81 mg by mouth every morning.     . Biotin 5000 MCG TABS Take 1 tablet by mouth every morning.    . Cyanocobalamin (VITAMIN B 12 PO) Take 1 tablet by mouth every morning.     [name] levothyroxine (SYNTHROID, LEVOTHROID) 137 MCG tablet Take 137 mcg by mouth daily before breakfast.    . lidocaine-prilocaine (EMLA) cream Apply cream to port-a-cath 1-2 hours prior to access. 30 g 3  . Multiple Vitamin (MULITIVITAMIN WITH MINERALS) TABS Take 1 tablet by mouth every morning.     [name]  Vitamin D, Cholecalciferol, 1000 UNITS TABS Take 2,000 Int'l Units by mouth daily at 12 noon.     . [DISCONTINUED] calcium-vitamin D (OSCAL WITH D) 500-200 MG-UNIT per tablet Take 1 tablet by mouth daily.     No current facility-administered medications for this encounter.   Facility-Administered Medications Ordered in Other Encounters  Medication Dose Route Frequency Provider Last Rate Last Dose    . 0.9 %  sodium chloride infusion   Intravenous Once Maria Marla Roe, MD   Stopped at 10/25/14 1925  . sodium chloride 0.9 % injection 10 mL  10 mL Intracatheter PRN Maria Marla Roe, MD   10 mL at 10/25/14 1925    REVIEW OF SYSTEMS:  Notable for that above.   PHYSICAL EXAM:  height is 5\' 3"  (1.6 m) and weight is 170 lb 11.2 oz (77.429 kg). Her oral temperature is 98.1 F (36.7 C). Her blood pressure is 126/77 and her pulse is 79. Her respiration is 20.   General: Alert and oriented, in no acute distress HEENT: Head is normocephalic. Extraocular movements are intact. Oropharynx is clear. Neck: Neck is supple, no palpable cervical or supraclavicular lymphadenopathy. Scar from thyroidectomy Heart: Regular in rate and rhythm with no murmurs, rubs, or gallops. Chest: Clear to auscultation bilaterally, with no rhonchi, wheezes, or rales. Abdomen: Soft, nontender, nondistended, with no rigidity or guarding. Extremities: No cyanosis or edema. Lymphatics: see Neck Exam Skin: No concerning lesions. Musculoskeletal: symmetric strength and muscle tone throughout. Neurologic: Cranial nerves II through XII are grossly intact. No obvious focalities. Speech is fluent. Coordination is intact. Psychiatric: Judgment and insight are intact. Affect is appropriate.   ECOG = 0  0 - Asymptomatic (Fully active, able to carry on all predisease activities without restriction)  1 - Symptomatic but completely ambulatory (Restricted in physically strenuous activity but ambulatory and able to carry out work of a light or sedentary nature. For example, light housework, office work)  2 - Symptomatic, <50% in bed during the day (Ambulatory and capable of all self care but unable to carry out any work activities. Up and about more than 50% of waking hours)  3 - Symptomatic, >50% in bed, but not bedbound (Capable of only limited self-care, confined to bed or chair 50% or more of waking hours)  4 - Bedbound  (Completely disabled. Cannot carry on any self-care. Totally confined to bed or chair)  5 - Death   Maria Simmons MM, Maria Simmons, Maria Simmons, et al. [phone]). "Toxicity and response criteria of the [facility]". [place] Oncol. 5 (6): 649-55   LABORATORY DATA:  Lab Results  Component Value Date   WBC 2.8* 01/04/2015   HGB 10.8* 01/04/2015   HCT 33.0* 01/04/2015   MCV 86.6 01/04/2015   PLT 340 01/04/2015   CMP     Component Value Date/Time   NA 141 01/04/2015 0907   NA 140 11/01/2014 1424   K 4.1 01/04/2015 0907   K 4.2 11/01/2014 1424   CL 99 11/01/2014 1424   CL 102 08/31/2012 1336   CO2 28 01/04/2015 0907   CO2 26 11/01/2014 1424   GLUCOSE 106 01/04/2015 0907   GLUCOSE 101* 11/01/2014 1424   GLUCOSE 95 08/31/2012 1336   BUN 7.8 01/04/2015 0907   BUN 9 11/01/2014 1424   CREATININE 0.8 01/04/2015 0907   CREATININE 0.63 11/01/2014 1424   CALCIUM 8.5 01/04/2015 0907   CALCIUM 9.0 11/01/2014 1424   PROT 6.2* 01/04/2015 0907   PROT  6.7 11/01/2014 1424   ALBUMIN 3.5 01/04/2015 0907   ALBUMIN 3.5 11/01/2014 1424   AST 15 01/04/2015 0907   AST 9 11/01/2014 1424   ALT 11 01/04/2015 0907   ALT 9 11/01/2014 1424   ALKPHOS 79 01/04/2015 0907   ALKPHOS 86 11/01/2014 1424   BILITOT 0.48 01/04/2015 0907   BILITOT 0.6 11/01/2014 1424   GFRNONAA >90 11/01/2014 1424   GFRAA >90 11/01/2014 1424         RADIOGRAPHY: Nm Pet Image Restag (ps) Skull Base To Thigh  01/04/2015   CLINICAL DATA:  Subsequent treatment strategy for diffuse large B-cell lymphoma. Thyroid cancer.  EXAM: NUCLEAR MEDICINE PET SKULL BASE TO THIGH  TECHNIQUE: Eight point for mCi F-18 FDG was injected intravenously. Full-ring PET imaging was performed from the skull base to thigh after the radiotracer. CT data was obtained and used for attenuation correction and anatomic localization.  FASTING BLOOD GLUCOSE:  Value: 106 mg/dl  COMPARISON:  Multiple exams, including 09/14/2014  FINDINGS: NECK   Symmetric high activity in the salivary glands, palatine tonsils and tonsillar pillars, and lingual tonsils along with high muscular activity along the tongue base. Prior adenopathy in the neck appears to of resolved, with no currently enlarged lymph nodes in the neck and no additional abnormal hypermetabolic activity aside from that mentioned above.  CHEST  No hypermetabolic mediastinal or hilar nodes. No suspicious pulmonary nodules on the CT scan. Thyroidectomy. Dilated distal esophagus versus small hiatal hernia.  ABDOMEN/PELVIS  No abnormal hypermetabolic activity within the liver, pancreas, adrenal glands, or spleen. No hypermetabolic lymph nodes in the abdomen or pelvis. Lap band in expected position.  There is a comet tail type band of high activity along the right posterior urinary bladder. Although there is some adjacent mild asymmetry of the vaginal cuff in this vicinity, this asymmetry was also present previously and there was no hypermetabolic activity in this area previously to suggest some form of malignancy along the vaginal cuff. I suspect that this comet tail appearance is due to motion artifact and/or physiologic activity in the rectum. There is no abnormal activity in the rest of the vagina to suggest a vesicovaginal fistula.  SKELETON  There is a new lucency in the right humeral head with maximum standard uptake value of 5.8.  IMPRESSION: 1. Prior abnormal adenopathy in the neck has resolved, without significant pathologic adenopathy or abnormal hypermetabolic nodal activity remaining. Thyroidectomy. 2. Symmetric physiologic activity in the salivary glands and tonsillar tissues in the neck. 3. There is a new lucent lesion in the head of the right humerus with faint sclerotic margin and hypermetabolic activity with maximum standard uptake value 5.8. Differential diagnostic considerations include erosive arthropathy or solitary osseous metastatic lesion. 4. Comet tail shaped band of tapering high  activity along the right posterior urinary bladder is thought to be artifact. I reviewed this appearance with Dr. Abigail Miyamoto, who concurred. 5. Dilated distal esophagus versus small hiatal hernia.   Electronically Signed   By: Sherryl Barters M.D.   On: 01/04/2015 11:18      IMPRESSION/PLAN: Lovely 49 yo woman with STAGE II DLBCL.  Will have MRI to rule out metastatic disease to shoulder on right.  If this is positive, biopsy may be needed, and if that is confirmatory, she understands systemic therapy / stem cell transplant would be needed rather than radiotherapy.  Today, I talked to the patient about the findings and work-up thus far. We discussed the patient's diagnosis and general treatment  for lymphoma, highlighting the role of radiotherapy in the management if her disease is in fact Stage II and not Stage IV (pending MRI results). We discussed the available radiation techniques, and focused on the details of logistics and delivery.    We discussed the risks, benefits, and side effects of radiotherapy. Side effects may include but not necessarily be limited to: mucositis, fatigue, taste changes, skin irritation. No guarantees of treatment were given. A consent form was signed and placed in the patient's medical record.  I would using ISRT to treat the  patient's throat/neck and sub pectoral region, where the node was conspicuous at initial imaging, over approximately 4 weeks. Will schedule simulation, assuming that MRI will be reassuring.  The patient was encouraged to ask questions that I answered to the best of my ability.     Nutrition and PT appointments, as ordered by med/onc, are pending. __________________________________________   Eppie Gibson, MD

## 2015-01-16 NOTE — Telephone Encounter (Signed)
Pt confirmed flush per 02/03 POF,  gave pt AVS..... KJ

## 2015-01-16 NOTE — Progress Notes (Signed)
Patient denies pain today. She is scheduled for MRI right shoulder tonight. She states she has developed pain in this shoulder with certain positions, and she "feels something there all the time."

## 2015-01-17 ENCOUNTER — Ambulatory Visit (HOSPITAL_BASED_OUTPATIENT_CLINIC_OR_DEPARTMENT_OTHER): Payer: 59

## 2015-01-17 ENCOUNTER — Telehealth: Payer: Self-pay | Admitting: Hematology and Oncology

## 2015-01-17 ENCOUNTER — Encounter: Payer: Self-pay | Admitting: Hematology and Oncology

## 2015-01-17 VITALS — BP 114/62 | HR 79 | Temp 98.4°F

## 2015-01-17 DIAGNOSIS — Z95828 Presence of other vascular implants and grafts: Secondary | ICD-10-CM

## 2015-01-17 DIAGNOSIS — Z452 Encounter for adjustment and management of vascular access device: Secondary | ICD-10-CM

## 2015-01-17 DIAGNOSIS — C8331 Diffuse large B-cell lymphoma, lymph nodes of head, face, and neck: Secondary | ICD-10-CM

## 2015-01-17 MED ORDER — HEPARIN SOD (PORK) LOCK FLUSH 100 UNIT/ML IV SOLN
500.0000 [IU] | Freq: Once | INTRAVENOUS | Status: AC
  Administered 2015-01-17: 500 [IU] via INTRAVENOUS
  Filled 2015-01-17: qty 5

## 2015-01-17 MED ORDER — SODIUM CHLORIDE 0.9 % IJ SOLN
10.0000 mL | INTRAMUSCULAR | Status: DC | PRN
Start: 2015-01-17 — End: 2015-01-17
  Administered 2015-01-17: 10 mL via INTRAVENOUS
  Filled 2015-01-17: qty 10

## 2015-01-17 NOTE — Patient Instructions (Signed)

## 2015-01-17 NOTE — Progress Notes (Signed)
[place] OFFICE PROGRESS NOTE  Patient Care Team: Jacelyn Pi, MD as PCP - General (Endocrinology) Lyman Speller, MD as Consulting Physician (Gynecology) Heath Lark, MD as Consulting Physician (Hematology and Oncology) Brooks Sailors, RN as Oncology Nurse Navigator Eppie Gibson, MD as Attending Physician (Radiation Oncology)  SUMMARY OF ONCOLOGIC HISTORY: Oncology History   DLBCL (diffuse large B cell lymphoma)   Staging form: Lymphoid Neoplasms, AJCC 6th Edition     Clinical stage from 11/15/2014: Stage II - Signed by Heath Lark, MD on 11/15/2014       DLBCL (diffuse large B cell lymphoma)   09/14/2014 Imaging PET/CT scan showed Hypermetabolic bilateral cervical lymphadenopathy, left side greater than right, and sub-cm hypermetabolic left subpectoral lymph node. Hypermetabolic activity throughout the lingular and palatine tonsils in Waldeyer's ring   10/12/2014 Procedure Biopsy of the left neck lymph node came back positive for diffuse large B-cell lymphoma.Accession: [id number]   10/24/2014 Bone Marrow Biopsy Bone marrow biopsy was negative.Cytogenetics were normal   10/24/2014 Imaging Echocardiogram showed normal ejection fraction   10/25/2014 - 12/06/2014 Chemotherapy She received 3 cyles of R CHOP chemotherapy. Further doses of R CHOP chemotherapy was reduced due to recent neutropenic fever with cycle 1 of treatment.   11/01/2014 Adverse Reaction She was seen in the emergency Department for possible neutropenic fever. Cultures were negative. She received antibiotic therapy.   11/15/2014 - 12/06/2014 Chemotherapy Intrathecal chemotherapy is added due to risk of CNS relapse   01/04/2015 Imaging PET CT scan show complete remission from lymphadenopathy. Incidental finding of right shoulder arthropathy   01/16/2015 Imaging MRI of the right shoulder show no evidence of cancer, with evidence of tendon tear and possible changes related to trauma    INTERVAL  HISTORY: Please see below for problem oriented charting. She returns for further follow-up. She denies significant pain in her shoulder.  REVIEW OF SYSTEMS:   Constitutional: Denies fevers, chills or abnormal weight loss Eyes: Denies blurriness of vision Ears, nose, mouth, throat, and face: Denies mucositis or sore throat Respiratory: Denies cough, dyspnea or wheezes Cardiovascular: Denies palpitation, chest discomfort or lower extremity swelling Gastrointestinal:  Denies nausea, heartburn or change in bowel habits Skin: Denies abnormal skin rashes Lymphatics: Denies new lymphadenopathy or easy bruising Neurological:Denies numbness, tingling or new weaknesses Behavioral/Psych: Mood is stable, no new changes  All other systems were reviewed with the patient and are negative.  I have reviewed the past medical history, past surgical history, social history and family history with the patient and they are unchanged from previous note.  ALLERGIES:  is allergic to metformin and related; tape; levaquin; and penicillins.  MEDICATIONS:  Current Outpatient Prescriptions  Medication Sig Dispense Refill  . aspirin EC 81 MG tablet Take 81 mg by mouth every morning.     . Biotin 5000 MCG TABS Take 1 tablet by mouth every morning.    . Cyanocobalamin (VITAMIN B 12 PO) Take 1 tablet by mouth every morning.     [name] levothyroxine (SYNTHROID, LEVOTHROID) 137 MCG tablet Take 137 mcg by mouth daily before breakfast.    . lidocaine-prilocaine (EMLA) cream Apply cream to port-a-cath 1-2 hours prior to access. 30 g 3  . Multiple Vitamin (MULITIVITAMIN WITH MINERALS) TABS Take 1 tablet by mouth every morning.     . Vitamin D, Cholecalciferol, 1000 UNITS TABS Take 2,000 Int'l Units by mouth daily at 12 noon.     . [DISCONTINUED] calcium-vitamin D (OSCAL WITH D) 500-200 MG-UNIT per tablet Take 1  tablet by mouth daily.     No current facility-administered medications for this visit.   Facility-Administered  Medications Ordered in Other Visits  Medication Dose Route Frequency Provider Last Rate Last Dose  . 0.9 %  sodium chloride infusion   Intravenous Once Aasim Marla Roe, MD   Stopped at 10/25/14 1925  . sodium chloride 0.9 % injection 10 mL  10 mL Intracatheter PRN Aasim Marla Roe, MD   10 mL at 10/25/14 1925  . sodium chloride 0.9 % injection 10 mL  10 mL Intravenous PRN Heath Lark, MD   10 mL at 01/17/15 0909    PHYSICAL EXAMINATION: ECOG PERFORMANCE STATUS: 0 - Asymptomatic GENERAL:alert, no distress and comfortable SKIN: skin color, texture, turgor are normal, no rashes or significant lesions Musculoskeletal:no cyanosis of digits and no clubbing  NEURO: alert & oriented x 3 with fluent speech, no focal motor/sensory deficits  LABORATORY DATA:  I have reviewed the data as listed    Component Value Date/Time   NA 141 01/04/2015 0907   NA 140 11/01/2014 1424   K 4.1 01/04/2015 0907   K 4.2 11/01/2014 1424   CL 99 11/01/2014 1424   CL 102 08/31/2012 1336   CO2 28 01/04/2015 0907   CO2 26 11/01/2014 1424   GLUCOSE 106 01/04/2015 0907   GLUCOSE 101* 11/01/2014 1424   GLUCOSE 95 08/31/2012 1336   BUN 7.8 01/04/2015 0907   BUN 9 11/01/2014 1424   CREATININE 0.8 01/04/2015 0907   CREATININE 0.63 11/01/2014 1424   CALCIUM 8.5 01/04/2015 0907   CALCIUM 9.0 11/01/2014 1424   PROT 6.2* 01/04/2015 0907   PROT 6.7 11/01/2014 1424   ALBUMIN 3.5 01/04/2015 0907   ALBUMIN 3.5 11/01/2014 1424   AST 15 01/04/2015 0907   AST 9 11/01/2014 1424   ALT 11 01/04/2015 0907   ALT 9 11/01/2014 1424   ALKPHOS 79 01/04/2015 0907   ALKPHOS 86 11/01/2014 1424   BILITOT 0.48 01/04/2015 0907   BILITOT 0.6 11/01/2014 1424   GFRNONAA >90 11/01/2014 1424   GFRAA >90 11/01/2014 1424    No results found for: SPEP, UPEP  Lab Results  Component Value Date   WBC 2.8* 01/04/2015   NEUTROABS 1.2* 01/04/2015   HGB 10.8* 01/04/2015   HCT 33.0* 01/04/2015   MCV 86.6 01/04/2015   PLT 340  01/04/2015      Chemistry      Component Value Date/Time   NA 141 01/04/2015 0907   NA 140 11/01/2014 1424   K 4.1 01/04/2015 0907   K 4.2 11/01/2014 1424   CL 99 11/01/2014 1424   CL 102 08/31/2012 1336   CO2 28 01/04/2015 0907   CO2 26 11/01/2014 1424   BUN 7.8 01/04/2015 0907   BUN 9 11/01/2014 1424   CREATININE 0.8 01/04/2015 0907   CREATININE 0.63 11/01/2014 1424      Component Value Date/Time   CALCIUM 8.5 01/04/2015 0907   CALCIUM 9.0 11/01/2014 1424   ALKPHOS 79 01/04/2015 0907   ALKPHOS 86 11/01/2014 1424   AST 15 01/04/2015 0907   AST 9 11/01/2014 1424   ALT 11 01/04/2015 0907   ALT 9 11/01/2014 1424   BILITOT 0.48 01/04/2015 0907   BILITOT 0.6 11/01/2014 1424       RADIOGRAPHIC STUDIES: I have personally reviewed the radiological images as listed and agreed with the findings in the report. Mr Shoulder Right W Wo Contrast  01/17/2015   CLINICAL DATA:  Abnormal PET scan with  lesion in the right proximal humerus. B-cell lymphoma. Thyroid cancer.  EXAM: MRI OF THE RIGHT SHOULDER WITHOUT AND WITH CONTRAST  TECHNIQUE: Multiplanar, multisequence MR imaging of the shoulder was performed. No intravenous contrast was administered.  COMPARISON:  PET-CT dated 01/04/2015  FINDINGS: Bones: There is a 19 x 17 mm osteochondral lesion of the humeral head with slight to collapse of the cortex at that site. 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 Does the patient take steroids?  Rotator cuff: There is a focal 9 mm full-thickness non retracted tear of the distal supraspinatus tendon on the insertion on the greater tuberosity. There is a partial thickness articular surface tear of the infraspinatus tendon 1 cm proximal to the insertion on the greater tuberosity. This is seen on image 7 of series 3 and image 11 of series 16.  The subscapularis and teres minor tendons are normal.  Muscles:  No atrophy or edema.  Biceps long head:  Properly  located and intact.  Acromioclavicular Joint: Type 2 acromion. Minimal degenerative changes of the [facility] joint.  Glenohumeral Joint: Small glenohumeral joint effusion.  Labrum:  Intact.  IMPRESSION: 1. Focal osteochondral lesion of the humeral head which correlates with the abnormality on PET scan. This is felt to be posttraumatic or secondary to avascular necrosis. This is not felt to represent malignancy. 2. Small full-thickness tear of the distal supraspinatus tendon. 3. Small partial thickness articular surface tear of the infraspinatus tendon.   Electronically Signed   By: Rozetta Nunnery M.D.   On: 01/17/2015 09:57     ASSESSMENT & PLAN:  DLBCL (diffuse large B cell lymphoma) We reviewed her case at the ENT tumor board. Imaging study of her shoulder did not show any evidence of cancer. The patient will proceed with radiation therapy to her head and neck region. I plan to repeat PET CT scan in 6 months.    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 The patient denies recent history of bleeding such as epistaxis, hematuria or hematochezia. She is asymptomatic from the anemia. I will observe for now.   Right shoulder pain Thankfully, her MRI showed no evidence of cancer. It is probably traumatic in nature. The patient is relatively asymptomatic. I recommend conservative management only.    Orders Placed This Encounter  Procedures  . NM PET Image Restag (PS) Skull Base To Thigh    Standing Status: Future     Number of Occurrences:      Standing Expiration Date: 03/18/2016    Order Specific Question:  Reason for Exam (SYMPTOM  OR DIAGNOSIS REQUIRED)    Answer:  staging lymphoma, exclude recurrence    Order Specific Question:  Is the patient pregnant?    Answer:  No    Order Specific Question:  Preferred imaging location?    Answer:  [facility]  . Comprehensive metabolic panel    Standing Status: Future     Number of Occurrences:      Standing Expiration  Date: 02/21/2016  . Lactate dehydrogenase    Standing Status: Future     Number of Occurrences:      Standing Expiration Date: 02/21/2016   All questions were answered. The patient knows to call the clinic with any problems, questions or concerns. No barriers to learning was detected. I spent 25 minutes counseling the patient face to face. The total time spent in the appointment was 30 minutes and more than 50% was  on counseling and review of test results     [facility], , MD 01/17/2015 12:12 PM

## 2015-01-17 NOTE — Assessment & Plan Note (Signed)
Thankfully, her MRI showed no evidence of cancer. It is probably traumatic in nature. The patient is relatively asymptomatic. I recommend conservative management only.

## 2015-01-17 NOTE — Telephone Encounter (Signed)
Left message to confirm August appointments. Mailed all scheduled appointment calendars.

## 2015-01-17 NOTE — Assessment & Plan Note (Signed)
This is likely due to recent treatment. The patient denies recent history of bleeding such as epistaxis, hematuria or hematochezia. She is asymptomatic from the anemia. I will observe for now.   

## 2015-01-17 NOTE — Telephone Encounter (Signed)
I reviewed the MRI scan and discussed result with the patient over the telephone. The abnormal area on her shoulder appears to be trauma related. The patient is relatively asymptomatic and her shoulder pain is improving. Recommend we proceed with radiation therapy as scheduled as this does not look malignant.

## 2015-01-17 NOTE — Assessment & Plan Note (Signed)
We reviewed her case at the ENT tumor board. Imaging study of her shoulder did not show any evidence of cancer. The patient will proceed with radiation therapy to her head and neck region. I plan to repeat PET CT scan in 6 months.

## 2015-01-17 NOTE — Progress Notes (Signed)
Met with patient prior to her consult appt with Dr. Isidore Moos and during her est pt appt with Dr. Alvy Bimler.   Further explained my role as her navigator, provided her my contact information.   No barriers to care identified. She understands she can contact me with needs/concerns.  Gayleen Orem, RN, BSN, [place] at [place] [phone]

## 2015-01-25 ENCOUNTER — Encounter: Payer: Self-pay | Admitting: *Deleted

## 2015-01-25 ENCOUNTER — Ambulatory Visit
Admission: RE | Admit: 2015-01-25 | Discharge: 2015-01-25 | Disposition: A | Discharge status: Home / Self Care | Payer: 59 | Source: Ambulatory Visit | Attending: Radiation Oncology | Admitting: Radiation Oncology

## 2015-01-25 DIAGNOSIS — C8330 Diffuse large B-cell lymphoma, unspecified site: Secondary | ICD-10-CM | POA: Diagnosis not present

## 2015-01-25 NOTE — Progress Notes (Signed)
Simulation, IMRT treatment planning note   Outpatient  Diagnosis:    ICD-9-CM ICD-10-CM   1. DLBCL (diffuse large B cell lymphoma) 202.80 C83.30      The patient was taken to the CT simulator and laid in the supine position on the table. An Aquaplast head and shoulder mask was custom fitted to the patient's anatomy. High-resolution CT axial imaging was obtained of the head and neck with contrast. I verified that the quality of the imaging is good for treatment planning. 1 Medically Necessary Treatment Device was fabricated and supervised by me: Aquaplast mask.   Treatment planning note I plan to treat the patient with helical Tomotherapy, IMRT. I plan to treat the patient's tumor and bilateral neck nodes. I plan to treat to a total dose of 30.6 Gray in 17 fractions. Dose calculation was ordered from dosimetry.  IMRT planning Note  IMRT is an important modality to deliver adequate dose to the patient's at risk tissues while sparing the patient's normal structures, including the: esophagus, parotid tissue, mandible, brain stem, lungs, spinal cord, oral cavity, brachial plexus.  This justifies the use of IMRT in the patient's treatment.     -----------------------------------  Eppie Gibson, MD

## 2015-01-26 NOTE — Progress Notes (Signed)
To provide support and encouragement, care continuity and to assess for needs, met with patient during CT SIM. She denied any needs or concerns; I encouraged her to contact me if that changes before her [employer], she verbalized understanding.  Gayleen Orem, RN, BSN, [place] at [place] [phone]

## 2015-01-28 DIAGNOSIS — C8330 Diffuse large B-cell lymphoma, unspecified site: Secondary | ICD-10-CM | POA: Diagnosis not present

## 2015-01-29 DIAGNOSIS — C8330 Diffuse large B-cell lymphoma, unspecified site: Secondary | ICD-10-CM | POA: Diagnosis not present

## 2015-02-04 ENCOUNTER — Telehealth: Payer: Self-pay | Admitting: *Deleted

## 2015-02-04 NOTE — Telephone Encounter (Signed)
Called patient to provide encouragement and answer any questions she had prior to [place].  She confirmed her understanding of the RadOnc registration process.  She denied any questions/concerns.  I explained I would be joining her during her first tmt.  She verbalized appreciation.  Gayleen Orem, RN, BSN, [place] at [place] [phone]

## 2015-02-05 ENCOUNTER — Ambulatory Visit
Admission: RE | Admit: 2015-02-05 | Discharge: 2015-02-05 | Disposition: A | Discharge status: Home / Self Care | Payer: 59 | Source: Ambulatory Visit | Attending: Radiation Oncology | Admitting: Radiation Oncology

## 2015-02-05 ENCOUNTER — Encounter: Payer: Self-pay | Admitting: *Deleted

## 2015-02-05 DIAGNOSIS — C8330 Diffuse large B-cell lymphoma, unspecified site: Secondary | ICD-10-CM | POA: Diagnosis not present

## 2015-02-05 NOTE — Progress Notes (Signed)
To provide support and encouragement, care continuity and to assess for needs, met with patient during her [place] tomo. 1. I reviewed treatment procedure, reinforced registration/waiting room notifcation/preparation procedures.  She verbalized understanding of information provided. 2.  She denied any needs at this time, understands she can contact me.  Gayleen Orem, RN, BSN, [place] at [place] [phone]

## 2015-02-05 NOTE — Progress Notes (Signed)
IMRT Device Note    ICD-9-CM ICD-10-CM   1. DLBCL (diffuse large B cell lymphoma) 202.80 C83.30     10.3 delivered field widths represent one set of IMRT treatment devices. The code is [phone].  -----------------------------------  Eppie Gibson, MD

## 2015-02-06 ENCOUNTER — Telehealth: Payer: Self-pay | Admitting: *Deleted

## 2015-02-06 ENCOUNTER — Ambulatory Visit
Admission: RE | Admit: 2015-02-06 | Discharge: 2015-02-06 | Disposition: A | Discharge status: Home / Self Care | Payer: 59 | Source: Ambulatory Visit | Attending: Radiation Oncology | Admitting: Radiation Oncology

## 2015-02-06 DIAGNOSIS — C8330 Diffuse large B-cell lymphoma, unspecified site: Secondary | ICD-10-CM

## 2015-02-06 MED ORDER — BIAFINE EX EMUL
Freq: Two times a day (BID) | CUTANEOUS | Status: DC
  Administered 2015-02-06: 11:00:00 via TOPICAL

## 2015-02-06 NOTE — Progress Notes (Signed)
Patient education completed with patient. Gave her "Radiation and You" booklet with all pertinent information marked and discussed, re: fatigue, mouth changes/management, nausea/management, skin irritation/care, throat irritation/management, nutrition, pain. Gave her Biafine with instructions for proper use. Teach back method used. Pt verbalized understanding.

## 2015-02-06 NOTE — Telephone Encounter (Signed)
Patient returned call and stated she would arrive early for patient education with this RN.

## 2015-02-07 ENCOUNTER — Ambulatory Visit
Admission: RE | Admit: 2015-02-07 | Discharge: 2015-02-07 | Disposition: A | Discharge status: Home / Self Care | Payer: 59 | Source: Ambulatory Visit | Attending: Radiation Oncology | Admitting: Radiation Oncology

## 2015-02-07 ENCOUNTER — Encounter: Payer: Self-pay | Admitting: Nutrition

## 2015-02-07 DIAGNOSIS — C8330 Diffuse large B-cell lymphoma, unspecified site: Secondary | ICD-10-CM | POA: Diagnosis not present

## 2015-02-07 NOTE — Progress Notes (Unsigned)
Patient canceled nutrition appointment for February 26.  Patient did not reschedule.

## 2015-02-08 ENCOUNTER — Encounter: Payer: Self-pay | Admitting: *Deleted

## 2015-02-08 ENCOUNTER — Encounter: Payer: 59 | Admitting: Nutrition

## 2015-02-08 ENCOUNTER — Ambulatory Visit
Admission: RE | Admit: 2015-02-08 | Discharge: 2015-02-08 | Disposition: A | Discharge status: Home / Self Care | Payer: 59 | Source: Ambulatory Visit | Attending: Radiation Oncology | Admitting: Radiation Oncology

## 2015-02-08 DIAGNOSIS — C8330 Diffuse large B-cell lymphoma, unspecified site: Secondary | ICD-10-CM | POA: Diagnosis not present

## 2015-02-08 NOTE — Progress Notes (Signed)
To provide support and encouragement, care continuity and to assess for needs, met with patient prior to her scheduled Tomo. She reported tmts going well. She denied any needs or concerns; I encouraged her to contact me if that changes before I see her next, she verbalized understanding.  Gayleen Orem, RN, BSN, [place] at [place] [phone]

## 2015-02-11 ENCOUNTER — Encounter: Payer: Self-pay | Admitting: Radiation Oncology

## 2015-02-11 ENCOUNTER — Ambulatory Visit
Admission: RE | Admit: 2015-02-11 | Discharge: 2015-02-11 | Disposition: A | Discharge status: Home / Self Care | Payer: 59 | Source: Ambulatory Visit | Attending: Radiation Oncology | Admitting: Radiation Oncology

## 2015-02-11 VITALS — BP 116/71 | HR 78 | Temp 98.7°F | Resp 18 | Wt 169.9 lb

## 2015-02-11 DIAGNOSIS — C8330 Diffuse large B-cell lymphoma, unspecified site: Secondary | ICD-10-CM

## 2015-02-11 MED ORDER — SUCRALFATE 1 G PO TABS: ORAL_TABLET | ORAL | Status: DC

## 2015-02-11 NOTE — Progress Notes (Signed)
Patient denies pain, fatigue, difficulty eating, swallowing, loss of appetite. She is applying Biafine to neck treatment area; no skin changes at this time.

## 2015-02-11 NOTE — Progress Notes (Signed)
   Weekly Management Note:  Outpatient    ICD-9-CM ICD-10-CM   1. DLBCL (diffuse large B cell lymphoma) 202.80 C83.30     Current Dose:  9 Gy  Projected Dose: 30.6 Gy   Narrative:  The patient presents for routine under treatment assessment.  CBCT/MVCT images/Port film x-rays were reviewed.  The chart was checked. No complaints   Physical Findings:  Wt Readings from Last 3 Encounters:  01/16/15 170 lb 11.2 oz (77.429 kg)  01/11/15 170 lb (77.111 kg)  01/07/15 171 lb 4.8 oz (77.701 kg)    weight is 169 lb 14.4 oz (77.066 kg). Her oral temperature is 98.7 F (37.1 C). Her blood pressure is 116/71 and her pulse is 78. Her respiration is 18.  Oropharyngeal mucosa is intact with no thrush or lesions.  Skin intact and smooth over neck.     CBC    Component Value Date/Time   WBC 2.8* 01/04/2015 0907   WBC 0.6* 11/01/2014 1424   RBC 3.81 01/04/2015 0907   RBC 4.11 11/01/2014 1424   HGB 10.8* 01/04/2015 0907   HGB 10.9* 11/01/2014 1424   HCT 33.0* 01/04/2015 0907   HCT 34.0* 11/01/2014 1424   PLT 340 01/04/2015 0907   PLT 173 11/01/2014 1424   MCV 86.6 01/04/2015 0907   MCV 82.7 11/01/2014 1424   MCH 28.3 01/04/2015 0907   MCH 26.5 11/01/2014 1424   MCHC 32.7 01/04/2015 0907   MCHC 32.1 11/01/2014 1424   RDW 17.4* 01/04/2015 0907   RDW 12.7 11/01/2014 1424   LYMPHSABS 0.7* 01/04/2015 0907   LYMPHSABS 0.5* 11/01/2014 1424   MONOABS 0.8 01/04/2015 0907   MONOABS 0.1 11/01/2014 1424   EOSABS 0.1 01/04/2015 0907   EOSABS 0.0 11/01/2014 1424   BASOSABS 0.0 01/04/2015 0907   BASOSABS 0.0 11/01/2014 1424     CMP     Component Value Date/Time   NA 141 01/04/2015 0907   NA 140 11/01/2014 1424   K 4.1 01/04/2015 0907   K 4.2 11/01/2014 1424   CL 99 11/01/2014 1424   CL 102 08/31/2012 1336   CO2 28 01/04/2015 0907   CO2 26 11/01/2014 1424   GLUCOSE 106 01/04/2015 0907   GLUCOSE 101* 11/01/2014 1424   GLUCOSE 95 08/31/2012 1336   BUN 7.8 01/04/2015 0907   BUN 9  11/01/2014 1424   CREATININE 0.8 01/04/2015 0907   CREATININE 0.63 11/01/2014 1424   CALCIUM 8.5 01/04/2015 0907   CALCIUM 9.0 11/01/2014 1424   PROT 6.2* 01/04/2015 0907   PROT 6.7 11/01/2014 1424   ALBUMIN 3.5 01/04/2015 0907   ALBUMIN 3.5 11/01/2014 1424   AST 15 01/04/2015 0907   AST 9 11/01/2014 1424   ALT 11 01/04/2015 0907   ALT 9 11/01/2014 1424   ALKPHOS 79 01/04/2015 0907   ALKPHOS 86 11/01/2014 1424   BILITOT 0.48 01/04/2015 0907   BILITOT 0.6 11/01/2014 1424   GFRNONAA >90 11/01/2014 1424   GFRAA >90 11/01/2014 1424     Impression:  The patient is tolerating radiotherapy.   Plan:  Continue radiotherapy as planned. Sucralfate PRN future sore throat  -----------------------------------  Eppie Gibson, MD

## 2015-02-12 ENCOUNTER — Other Ambulatory Visit: Payer: Self-pay | Admitting: Radiation Oncology

## 2015-02-12 ENCOUNTER — Telehealth: Payer: Self-pay

## 2015-02-12 ENCOUNTER — Telehealth: Payer: Self-pay | Admitting: *Deleted

## 2015-02-12 ENCOUNTER — Ambulatory Visit
Admission: RE | Admit: 2015-02-12 | Discharge: 2015-02-12 | Disposition: A | Discharge status: Home / Self Care | Payer: 59 | Source: Ambulatory Visit | Attending: Radiation Oncology | Admitting: Radiation Oncology

## 2015-02-12 DIAGNOSIS — C8330 Diffuse large B-cell lymphoma, unspecified site: Secondary | ICD-10-CM | POA: Diagnosis not present

## 2015-02-12 DIAGNOSIS — E890 Postprocedural hypothyroidism: Secondary | ICD-10-CM

## 2015-02-12 DIAGNOSIS — Z85850 Personal history of malignant neoplasm of thyroid: Secondary | ICD-10-CM

## 2015-02-12 NOTE — Telephone Encounter (Signed)
Received fax from Dr Almetta Lovely office with order for TSH to be drawn today. Spoke with Tamala Ser, Management consultant who stated the [facility] lab is unable to perform thios service for Ms Osland due to the lab being ordered by outside physician, not oncology physician.  Left vm for patient re: her TSH lab draw in regards to above note. Left call back name and number should patient have any questions.   11:07 am  Received call back from patient who states Dr Isidore Moos informed her yesterday that she will put order in for her lab, ordering the exact lab Dr Chalmers Cater requests. Patient is aware Dr Isidore Moos is out of office today, will return tomorrow. Ms Lebron states "I am there every day and can get my lab any time."   1:00 pm Per in basket from Dr Isidore Moos, lab for [facility] scheduled for 02/13/15. Pt notified by Verdell Carmine, medical secretary.

## 2015-02-12 NOTE — Telephone Encounter (Signed)
Patient returned call.labs at 11:45 am after treatment on 02/13/15 is fine with patient.

## 2015-02-12 NOTE — Telephone Encounter (Signed)
CALLED PATIENT TO ASK ABOUT WHAT TIME THAT SHE WOULD LIKE TO GET HER LAB ON 02-13-15, LVM FOR A RETURN CALL

## 2015-02-13 ENCOUNTER — Ambulatory Visit
Admission: RE | Admit: 2015-02-13 | Discharge: 2015-02-13 | Disposition: A | Discharge status: Home / Self Care | Payer: 59 | Source: Ambulatory Visit | Attending: Radiation Oncology | Admitting: Radiation Oncology

## 2015-02-13 DIAGNOSIS — E890 Postprocedural hypothyroidism: Secondary | ICD-10-CM

## 2015-02-13 DIAGNOSIS — C8330 Diffuse large B-cell lymphoma, unspecified site: Secondary | ICD-10-CM | POA: Diagnosis not present

## 2015-02-13 DIAGNOSIS — Z85850 Personal history of malignant neoplasm of thyroid: Secondary | ICD-10-CM

## 2015-02-13 LAB — TSH CHCC: TSH: 0.012 m[IU]/L — AB (ref 0.308–3.960)

## 2015-02-14 ENCOUNTER — Telehealth: Payer: Self-pay | Admitting: *Deleted

## 2015-02-14 ENCOUNTER — Ambulatory Visit
Admission: RE | Admit: 2015-02-14 | Discharge: 2015-02-14 | Disposition: A | Discharge status: Home / Self Care | Payer: 59 | Source: Ambulatory Visit | Attending: Radiation Oncology | Admitting: Radiation Oncology

## 2015-02-14 DIAGNOSIS — C8330 Diffuse large B-cell lymphoma, unspecified site: Secondary | ICD-10-CM | POA: Diagnosis not present

## 2015-02-14 NOTE — Telephone Encounter (Signed)
VM from [facility],  Case Manager at [facility] (phone [phone] ext [phone]).  She asks if pt has both Thyroid Cancer and Lymphoma?  Called back and left VM informing pt has history of Thyroid cancer w/ thyroidectomy in 2007.   Pt currently being treated for Diffuse large B cell Lymphoma.   Faxed most recent office note from Dr. Alvy Bimler and Dr. Isidore Moos to [place] at fax [phone].

## 2015-02-14 NOTE — Telephone Encounter (Signed)
Called patient and left voice mail to inform her that her TSH lab result was faxed to Dr Chalmers Cater yesterday afternoon. Informed her she does not have to call back unless she has questions. Left this caller's name and number.

## 2015-02-15 ENCOUNTER — Ambulatory Visit
Admission: RE | Admit: 2015-02-15 | Discharge: 2015-02-15 | Disposition: A | Discharge status: Home / Self Care | Payer: 59 | Source: Ambulatory Visit | Attending: Radiation Oncology | Admitting: Radiation Oncology

## 2015-02-15 ENCOUNTER — Encounter: Payer: Self-pay | Admitting: *Deleted

## 2015-02-15 DIAGNOSIS — C8330 Diffuse large B-cell lymphoma, unspecified site: Secondary | ICD-10-CM | POA: Diagnosis not present

## 2015-02-15 NOTE — Progress Notes (Signed)
To provide support and encouragement, care continuity and to assess for needs, met with patient prior to Tomo.  She reported tmts continue to go well.  She expressed excitement that she is half way completed with tmts.  She denied any needs or concerns; I encouraged her to contact me if that changes before I see her next, she verbalized understanding.  Gayleen Orem, RN, BSN, [place] at [place] [phone]

## 2015-02-18 ENCOUNTER — Encounter: Payer: Self-pay | Admitting: Radiation Oncology

## 2015-02-18 ENCOUNTER — Ambulatory Visit
Admission: RE | Admit: 2015-02-18 | Discharge: 2015-02-18 | Disposition: A | Discharge status: Home / Self Care | Payer: 59 | Source: Ambulatory Visit | Attending: Radiation Oncology | Admitting: Radiation Oncology

## 2015-02-18 ENCOUNTER — Telehealth: Payer: Self-pay | Admitting: *Deleted

## 2015-02-18 VITALS — BP 136/81 | HR 103 | Temp 98.0°F | Resp 20 | Wt 165.3 lb

## 2015-02-18 DIAGNOSIS — C8330 Diffuse large B-cell lymphoma, unspecified site: Secondary | ICD-10-CM | POA: Diagnosis not present

## 2015-02-18 MED ORDER — LIDOCAINE VISCOUS 2 % MT SOLN: OROMUCOSAL | Status: DC

## 2015-02-18 NOTE — Telephone Encounter (Signed)
CALLED PATIENT TO INFORM OF APPT. WITH BARBARA NEFF ON 02-22-15 @ 9:45 AM, LVM FOR A RETURN CALL

## 2015-02-18 NOTE — Telephone Encounter (Signed)
Call from case manager, [place] at [place] [phone], ext. [phone].   She asks name and contact number for RadOnc and next appt for Dr. Alvy Bimler.  Called back and left Dr. Pearlie Oyster name and number for Rad Onc dept.. Informed her that pt's next appt w/ Dr. Alvy Bimler is on 07/18/15.

## 2015-02-18 NOTE — Progress Notes (Signed)
Weekly rad txs, tonsil/neck, no skin changes,using biafine daily, has sore throat since Saturday, threw up a couple times, dificuty swallowing solid foods, eating softer foods, started 146mcg thryroid medication today, will pick up the Carafate today, energy level fair, c/o pain  left hip radiating down to her ankle , almost buckled on her yesterday 12:03 PM

## 2015-02-18 NOTE — Progress Notes (Signed)
Weekly Management Note:  Outpatient    ICD-9-CM ICD-10-CM   1. 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  CBCT/MVCT images/Port film x-rays were reviewed.  The chart was checked. Developing a sore throat and regurgitating solid food at times.   She has ativan and zofran from chemotherapy.  Hasn't tried sucralfate for throat, yet.  Thick sputum. Drinking plenty of water.  Still working.  Physical Findings:  Wt Readings from Last 3 Encounters:  01/16/15 170 lb 11.2 oz (77.429 kg)  01/11/15 170 lb (77.111 kg)  01/07/15 171 lb 4.8 oz (77.701 kg)    weight is 165 lb 4.8 oz (74.98 kg). Her oral temperature is 98 F (36.7 C). Her blood pressure is 136/81 and her pulse is 103. Her respiration is 20 and oxygen saturation is 100%.  Oropharyngeal mucosa is intact with no thrush or lesions.  Skin intact and smooth over neck.     CBC    Component Value Date/Time   WBC 2.8* 01/04/2015 0907   WBC 0.6* 11/01/2014 1424   RBC 3.81 01/04/2015 0907   RBC 4.11 11/01/2014 1424   HGB 10.8* 01/04/2015 0907   HGB 10.9* 11/01/2014 1424   HCT 33.0* 01/04/2015 0907   HCT 34.0* 11/01/2014 1424   PLT 340 01/04/2015 0907   PLT 173 11/01/2014 1424   MCV 86.6 01/04/2015 0907   MCV 82.7 11/01/2014 1424   MCH 28.3 01/04/2015 0907   MCH 26.5 11/01/2014 1424   MCHC 32.7 01/04/2015 0907   MCHC 32.1 11/01/2014 1424   RDW 17.4* 01/04/2015 0907   RDW 12.7 11/01/2014 1424   LYMPHSABS 0.7* 01/04/2015 0907   LYMPHSABS 0.5* 11/01/2014 1424   MONOABS 0.8 01/04/2015 0907   MONOABS 0.1 11/01/2014 1424   EOSABS 0.1 01/04/2015 0907   EOSABS 0.0 11/01/2014 1424   BASOSABS 0.0 01/04/2015 0907   BASOSABS 0.0 11/01/2014 1424     CMP     Component Value Date/Time   NA 141 01/04/2015 0907   NA 140 11/01/2014 1424   K 4.1 01/04/2015 0907   K  4.2 11/01/2014 1424   CL 99 11/01/2014 1424   CL 102 08/31/2012 1336   CO2 28 01/04/2015 0907   CO2 26 11/01/2014 1424   GLUCOSE 106 01/04/2015 0907   GLUCOSE 101* 11/01/2014 1424   GLUCOSE 95 08/31/2012 1336   BUN 7.8 01/04/2015 0907   BUN 9 11/01/2014 1424   CREATININE 0.8 01/04/2015 0907   CREATININE 0.63 11/01/2014 1424   CALCIUM 8.5 01/04/2015 0907   CALCIUM 9.0 11/01/2014 1424   PROT 6.2* 01/04/2015 0907   PROT 6.7 11/01/2014 1424   ALBUMIN 3.5 01/04/2015 0907   ALBUMIN 3.5 11/01/2014 1424   AST 15 01/04/2015 0907   AST 9 11/01/2014 1424   ALT 11 01/04/2015 0907   ALT 9 11/01/2014 1424   ALKPHOS 79 01/04/2015 0907   ALKPHOS 86 11/01/2014 1424   BILITOT 0.48 01/04/2015 0907   BILITOT 0.6 11/01/2014 1424   GFRNONAA >90 11/01/2014 1424   GFRAA >90 11/01/2014 1424     Impression:  The patient is tolerating radiotherapy.   Plan:  Continue radiotherapy as planned. Sucralfate PRN future sore throat, lidocaine Rx as well.  Pt would like to hold off on pain meds.  Papaya juice and diet ginger ale for thick sputum.  Antiemetics prn (which may or may not help.) Refer to nutrition.  Advice given until then to improve oral intake.  -----------------------------------  Eppie Gibson, MD

## 2015-02-19 ENCOUNTER — Ambulatory Visit
Admission: RE | Admit: 2015-02-19 | Discharge: 2015-02-19 | Disposition: A | Discharge status: Home / Self Care | Payer: 59 | Source: Ambulatory Visit | Attending: Radiation Oncology | Admitting: Radiation Oncology

## 2015-02-19 DIAGNOSIS — C8330 Diffuse large B-cell lymphoma, unspecified site: Secondary | ICD-10-CM | POA: Diagnosis not present

## 2015-02-20 ENCOUNTER — Ambulatory Visit
Admission: RE | Admit: 2015-02-20 | Discharge: 2015-02-20 | Disposition: A | Discharge status: Home / Self Care | Payer: 59 | Source: Ambulatory Visit | Attending: Radiation Oncology | Admitting: Radiation Oncology

## 2015-02-20 DIAGNOSIS — C8330 Diffuse large B-cell lymphoma, unspecified site: Secondary | ICD-10-CM | POA: Diagnosis not present

## 2015-02-21 ENCOUNTER — Telehealth: Payer: Self-pay | Admitting: Radiation Oncology

## 2015-02-21 ENCOUNTER — Ambulatory Visit
Admission: RE | Admit: 2015-02-21 | Discharge: 2015-02-21 | Disposition: A | Discharge status: Home / Self Care | Payer: 59 | Source: Ambulatory Visit | Attending: Radiation Oncology | Admitting: Radiation Oncology

## 2015-02-21 DIAGNOSIS — C8330 Diffuse large B-cell lymphoma, unspecified site: Secondary | ICD-10-CM | POA: Diagnosis not present

## 2015-02-22 ENCOUNTER — Ambulatory Visit
Admission: RE | Admit: 2015-02-22 | Discharge: 2015-02-22 | Disposition: A | Discharge status: Home / Self Care | Payer: 59 | Source: Ambulatory Visit | Attending: Radiation Oncology | Admitting: Radiation Oncology

## 2015-02-22 ENCOUNTER — Ambulatory Visit: Payer: 59 | Admitting: Nutrition

## 2015-02-22 DIAGNOSIS — C8330 Diffuse large B-cell lymphoma, unspecified site: Secondary | ICD-10-CM | POA: Diagnosis not present

## 2015-02-22 NOTE — Progress Notes (Signed)
49 year old female diagnosed with B-cell lymphoma receiving radiation therapy.  She is a patient of Dr. Isidore Moos.  Past medical history includes diabetes, hypercholesterolemia, and papillary thyroid cancer.  Medications include biotin, vitamin B12, Synthroid, multivitamin, Carafate, and vitamin D.  Labs include TSH of 0.012.  Height: 63 inches. Weight: 165.3 pounds. Usual body weight: 180 pounds September 2015. BMI: 29.29.  Patient reports taste alterations.  Food tastes "chalky" to her. Patient also describes some nausea and vomiting as well as sore throat. Patient reports eating less secondary to taste alterations. Final radiation treatment.  March 16.  Nutrition diagnosis: Unintended weight loss related to taste alterations as evidenced by 8 percent weight loss over 6 months.  Intervention:  Patient educated to consume smaller more frequent meals and snacks utilizing high-calorie, high-protein foods. Educated patient on strategies for improving taste.  Provided handout on taste alterations. Recommended patient continue to gargle with salt and baking soda water as prescribed by physician. Encouraged patient continue to take medications to ease sore throat. Recommended patient try oral nutrition supplements to assist with weight maintenance.  Samples were provided. Questions answered.  Teach back method used.  Contact information given.  Monitoring, evaluation, goals: Patient will tolerate adequate calories and protein to promote weight maintenance.  Next visit: Patient will contact me for questions or concerns.  **Disclaimer: This note was dictated with voice recognition software. Similar sounding words can inadvertently be transcribed and this note may contain transcription errors which may not have been corrected upon publication of note.**

## 2015-02-25 ENCOUNTER — Ambulatory Visit
Admission: RE | Admit: 2015-02-25 | Discharge: 2015-02-25 | Disposition: A | Discharge status: Home / Self Care | Payer: 59 | Source: Ambulatory Visit | Attending: Radiation Oncology | Admitting: Radiation Oncology

## 2015-02-25 VITALS — BP 130/78 | HR 86 | Temp 97.8°F | Wt 158.5 lb

## 2015-02-25 DIAGNOSIS — C8330 Diffuse large B-cell lymphoma, unspecified site: Secondary | ICD-10-CM | POA: Diagnosis not present

## 2015-02-25 NOTE — Progress Notes (Signed)
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

## 2015-02-25 NOTE — Telephone Encounter (Signed)
completed

## 2015-02-25 NOTE — Progress Notes (Signed)
Weekly Management Note:  Outpatient    ICD-9-CM ICD-10-CM   1. DLBCL (diffuse large B cell lymphoma) 202.80 C83.30     Current Dose: 27 Gy  Projected Dose: 30.6 Gy   Narrative:  The patient presents for routine under treatment assessment.  CBCT/MVCT images/Port film x-rays were reviewed.  The chart was checked.   Doing well overall but does have sore throat.Takes ibuprofen during the day and 1 hydrocodone 5/325 at bedtime.Saliva is thick , doing mouth rinse of baking soda and water alternating with lidocaine.  She is not orthostatic.  7 lb loss in 1 week due to throat pain, but doesn't want to take narcotics during work. drinking a lot of H2O  Physical Findings:  Wt Readings from Last 3 Encounters:  02/25/15 158 lb 8 oz (71.895 kg)  01/16/15 170 lb 11.2 oz (77.429 kg)  01/11/15 170 lb (77.111 kg)    weight is 158 lb 8 oz (71.895 kg). Her temperature is 97.8 F (36.6 C). Her blood pressure is 130/78 and her pulse is 86. Her oxygen saturation is 100%.  Oropharyngeal mucosa is erythematous with no thrush or lesions.  Skin intact and smooth over neck.     CBC    Component Value Date/Time   WBC 2.8* 01/04/2015 0907   WBC 0.6* 11/01/2014 1424   RBC 3.81 01/04/2015 0907   RBC 4.11 11/01/2014 1424   HGB 10.8* 01/04/2015 0907   HGB 10.9* 11/01/2014 1424   HCT 33.0* 01/04/2015 0907   HCT 34.0* 11/01/2014 1424   PLT 340 01/04/2015 0907   PLT 173 11/01/2014 1424   MCV 86.6 01/04/2015 0907   MCV 82.7 11/01/2014 1424   MCH 28.3 01/04/2015 0907   MCH 26.5 11/01/2014 1424   MCHC 32.7 01/04/2015 0907   MCHC 32.1 11/01/2014 1424   RDW 17.4* 01/04/2015 0907   RDW 12.7 11/01/2014 1424   LYMPHSABS 0.7* 01/04/2015 0907   LYMPHSABS 0.5* 11/01/2014 1424   MONOABS 0.8 01/04/2015 0907   MONOABS 0.1 11/01/2014 1424   EOSABS 0.1 01/04/2015 0907   EOSABS 0.0 11/01/2014 1424   BASOSABS 0.0 01/04/2015 0907   BASOSABS 0.0 11/01/2014 1424     CMP     Component Value Date/Time   NA 141  01/04/2015 0907   NA 140 11/01/2014 1424   K 4.1 01/04/2015 0907   K 4.2 11/01/2014 1424   CL 99 11/01/2014 1424   CL 102 08/31/2012 1336   CO2 28 01/04/2015 0907   CO2 26 11/01/2014 1424   GLUCOSE 106 01/04/2015 0907   GLUCOSE 101* 11/01/2014 1424   GLUCOSE 95 08/31/2012 1336   BUN 7.8 01/04/2015 0907   BUN 9 11/01/2014 1424   CREATININE 0.8 01/04/2015 0907   CREATININE 0.63 11/01/2014 1424   CALCIUM 8.5 01/04/2015 0907   CALCIUM 9.0 11/01/2014 1424   PROT 6.2* 01/04/2015 0907   PROT 6.7 11/01/2014 1424   ALBUMIN 3.5 01/04/2015 0907   ALBUMIN 3.5 11/01/2014 1424   AST 15 01/04/2015 0907   AST 9 11/01/2014 1424   ALT 11 01/04/2015 0907   ALT 9 11/01/2014 1424   ALKPHOS 79 01/04/2015 0907   ALKPHOS 86 11/01/2014 1424   BILITOT 0.48 01/04/2015 0907   BILITOT 0.6 11/01/2014 1424   GFRNONAA >90 11/01/2014 1424   GFRAA >90 11/01/2014 1424     Impression:  The patient is tolerating radiotherapy.   Plan:  Continue radiotherapy as planned.  She has met with Dory Peru and knows to call  her if weight loss continues at >3lb/ week.  F.u in 6moafter completing RT, sooner if needed. She is drinking plenty of fluid. -----------------------------------  SEppie Gibson MD

## 2015-02-26 ENCOUNTER — Ambulatory Visit
Admission: RE | Admit: 2015-02-26 | Discharge: 2015-02-26 | Disposition: A | Discharge status: Home / Self Care | Payer: 59 | Source: Ambulatory Visit | Attending: Radiation Oncology | Admitting: Radiation Oncology

## 2015-02-26 DIAGNOSIS — C8330 Diffuse large B-cell lymphoma, unspecified site: Secondary | ICD-10-CM | POA: Diagnosis not present

## 2015-02-27 ENCOUNTER — Ambulatory Visit (HOSPITAL_BASED_OUTPATIENT_CLINIC_OR_DEPARTMENT_OTHER): Payer: 59

## 2015-02-27 ENCOUNTER — Encounter: Payer: Self-pay | Admitting: *Deleted

## 2015-02-27 ENCOUNTER — Encounter: Payer: Self-pay | Admitting: Radiation Oncology

## 2015-02-27 ENCOUNTER — Ambulatory Visit
Admission: RE | Admit: 2015-02-27 | Discharge: 2015-02-27 | Disposition: A | Discharge status: Home / Self Care | Payer: 59 | Source: Ambulatory Visit | Attending: Radiation Oncology | Admitting: Radiation Oncology

## 2015-02-27 VITALS — BP 130/71 | HR 79 | Temp 98.2°F | Resp 20

## 2015-02-27 DIAGNOSIS — Z95828 Presence of other vascular implants and grafts: Secondary | ICD-10-CM

## 2015-02-27 DIAGNOSIS — C8330 Diffuse large B-cell lymphoma, unspecified site: Secondary | ICD-10-CM

## 2015-02-27 DIAGNOSIS — Z452 Encounter for adjustment and management of vascular access device: Secondary | ICD-10-CM

## 2015-02-27 MED ORDER — HEPARIN SOD (PORK) LOCK FLUSH 100 UNIT/ML IV SOLN
500.0000 [IU] | Freq: Once | INTRAVENOUS | Status: AC
  Administered 2015-02-27: 500 [IU] via INTRAVENOUS
  Filled 2015-02-27: qty 5

## 2015-02-27 MED ORDER — SODIUM CHLORIDE 0.9 % IJ SOLN
10.0000 mL | INTRAMUSCULAR | Status: DC | PRN
  Administered 2015-02-27: 10 mL via INTRAVENOUS
  Filled 2015-02-27: qty 10

## 2015-02-27 NOTE — Patient Instructions (Signed)

## 2015-03-04 ENCOUNTER — Other Ambulatory Visit: Payer: 59

## 2015-03-04 NOTE — Progress Notes (Signed)
  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      Plan: The patient has completed radiation treatment. The patient will return to radiation oncology clinic for routine followup in one month. I advised them to call or return sooner if they have any questions or concerns related to their recovery or treatment.  -----------------------------------  Eppie Gibson, MD

## 2015-03-04 NOTE — Progress Notes (Signed)
Met with pt during final RT to offer support and to celebrate end of radiation treatment.  She was accompanied by her children.  I explained that my role as navigator will continue for several more months and that I will be calling and/or joining her during follow-up visits.    I encouraged her to call me with needs/concerns.  She verbalized understanding.  Gayleen Orem, RN, BSN, [facility] [phone]

## 2015-03-06 DIAGNOSIS — M79673 Pain in unspecified foot: Secondary | ICD-10-CM

## 2015-03-11 ENCOUNTER — Ambulatory Visit: Payer: 59

## 2015-03-12 ENCOUNTER — Other Ambulatory Visit: Payer: Self-pay | Admitting: Hematology and Oncology

## 2015-04-04 DIAGNOSIS — M79673 Pain in unspecified foot: Secondary | ICD-10-CM

## 2015-04-10 ENCOUNTER — Encounter: Payer: Self-pay | Admitting: Radiation Oncology

## 2015-04-10 ENCOUNTER — Ambulatory Visit
Admission: RE | Admit: 2015-04-10 | Discharge: 2015-04-10 | Disposition: A | Discharge status: Home / Self Care | Payer: 59 | Source: Ambulatory Visit | Attending: Radiation Oncology | Admitting: Radiation Oncology

## 2015-04-10 ENCOUNTER — Ambulatory Visit (HOSPITAL_BASED_OUTPATIENT_CLINIC_OR_DEPARTMENT_OTHER): Payer: 59

## 2015-04-10 VITALS — BP 139/79 | HR 97 | Temp 98.1°F | Resp 20 | Wt 151.8 lb

## 2015-04-10 VITALS — BP 128/81 | HR 85 | Temp 98.4°F

## 2015-04-10 DIAGNOSIS — Z95828 Presence of other vascular implants and grafts: Secondary | ICD-10-CM

## 2015-04-10 DIAGNOSIS — C8330 Diffuse large B-cell lymphoma, unspecified site: Secondary | ICD-10-CM

## 2015-04-10 DIAGNOSIS — Z452 Encounter for adjustment and management of vascular access device: Secondary | ICD-10-CM

## 2015-04-10 MED ORDER — HEPARIN SOD (PORK) LOCK FLUSH 100 UNIT/ML IV SOLN
500.0000 [IU] | Freq: Once | INTRAVENOUS | Status: AC
  Administered 2015-04-10: 500 [IU] via INTRAVENOUS
  Filled 2015-04-10: qty 5

## 2015-04-10 MED ORDER — SODIUM CHLORIDE 0.9 % IJ SOLN
10.0000 mL | INTRAMUSCULAR | Status: DC | PRN
  Administered 2015-04-10: 10 mL via INTRAVENOUS
  Filled 2015-04-10: qty 10

## 2015-04-10 NOTE — Progress Notes (Addendum)
Pain Status: benind ears and to jawline tenderness,  Ears stopped up  at times  Weight changes, if any: stable 1-3 lbs  Nutritional Status eating eggs, tolrates no taste though,  [place] bacon, ground beef, salads, taste off,  a) intake:50% b) using a feeding tube?: No c) weight changes, if any: no  Swallowing Status:  No difficulties, drinks water and milk no problem either  Dental (if applicable): When was last visit with dentistry  lst year Using fluoride trays daily?  No , just  biotene   When was last ENT visit?  None   When is next ENT visit? none  Other notable issues, if any:  No nausea, tongue coated yellow/white,   Food gets stuck at times and thick saliva, and gets choled, using biotene, thyroid is being changed to 116mg since yesterday last TSH done [facility]almers [facility] 1 wwek ago Monday 8:46 AM BP 139/79 mmHg  Pulse 97  Temp(Src) 98.1 F (36.7 C) (Oral)  Resp 20  Wt 151 lb 12.8 oz (68.856 kg)  SpO2 100%  LMP 04/13/2000  Wt Readings from Last 3 Encounters:  02/25/15 158 lb 8 oz (71.895 kg)  01/16/15 170 lb 11.2 oz (77.429 kg)  01/11/15 170 lb (77.111 kg)

## 2015-04-10 NOTE — Patient Instructions (Signed)

## 2015-04-10 NOTE — Addendum Note (Signed)
Encounter addended by: Eppie Gibson, MD on: 04/10/2015  9:20 AM<BR>     Documentation filed: Notes Section

## 2015-04-10 NOTE — Progress Notes (Addendum)
Radiation Oncology         [phone] ________________________________  Name: Maria Simmons MRN: [id number]  Date: 04/10/2015  DOB: 1966-09-29  Follow-Up Visit Note  Outpatient  CC: Jacelyn Pi, MD  Jacelyn Pi, MD  Diagnosis and Prior Radiotherapy:    ICD-9-CM ICD-10-CM   1. 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  Weight is down approximately 7 lbs. She is eating less than usual. Pt has a normal diet. Pt has dysgeusia. The pt has some mild swallowing problems. Food goes down properly but she then feels a lump in throat and spits out thick saliva. Her thyroid problem managed by her primary care physician. She has some pain behind the ears and tenderness along the jaw line.  ALLERGIES:  is allergic to metformin and related; tape; levaquin; and penicillins.  Meds: Current Outpatient Prescriptions  Medication Sig Dispense Refill  . aspirin EC 81 MG tablet Take 81 mg by mouth every morning.     . Biotin 5000 MCG TABS Take 1 tablet by mouth every morning.    . Cyanocobalamin (VITAMIN B 12 PO) Take 1 tablet by mouth every morning.     [name] HYDROcodone-acetaminophen (NORCO/VICODIN) 5-325 MG per tablet Take 1 tablet by mouth every 6 (six) hours as needed for moderate pain. Takes 1 tablet at bedtime    . ibuprofen (ADVIL,MOTRIN) 200 MG tablet Take 200 mg by mouth every 6 (six) hours as needed.    [name] levothyroxine (SYNTHROID, LEVOTHROID) 137 MCG tablet 137 mcg daily before breakfast. Decreased to 179mcg started 02/18/15    . lidocaine (XYLOCAINE) 2 % solution Patient: Mix 1part 2% viscous lidocaine, 1part H20. Swish and/or swallow 38mL of this mixture, 51min  before meals and at bedtime, up to QID 100 mL 5  . Multiple Vitamin (MULITIVITAMIN WITH MINERALS) TABS Take 1 tablet by mouth every morning.     . Vitamin D, Cholecalciferol, 1000 UNITS TABS Take 2,000 Int'l Units by mouth daily at 12 noon.     . lidocaine-prilocaine (EMLA) cream Apply cream to port-a-cath 1-2 hours prior to access. (Patient not taking: Reported on 04/10/2015) 30 g 3  . sucralfate (CARAFATE) 1 G tablet Dissolve 1 tablet in 10 mL H20 and swallow 30 min prior to meals and bedtime. (Patient not taking: Reported on 04/10/2015) 30 tablet 5  . [DISCONTINUED] calcium-vitamin D (OSCAL WITH D) 500-200 MG-UNIT per tablet Take 1 tablet by mouth daily.     No current facility-administered medications for this encounter.    Physical Findings: The patient is in no acute distress. Patient is alert and oriented. Oropharyngeal mucosa is intact with no thrush or lesions. No palpable cervical or supraclavicular lymphadenopathy. Skin intact and smooth over neck.   Slightly prominent submandibular glands.   weight is 151 lb 12.8 oz (68.856 kg). Her oral temperature is 98.1 F (36.7 C). Her blood pressure is 139/79 and her pulse is 97. Her respiration is 20 and oxygen saturation is 100%. .      Lab Findings: Lab Results  Component Value Date   WBC 2.8* 01/04/2015   HGB 10.8* 01/04/2015   HCT 33.0* 01/04/2015  MCV 86.6 01/04/2015   PLT 340 01/04/2015    Radiographic Findings: No results found.  Impression/Plan:  Patient given card to schedule follow up in June. The pt should follow up w/ Dr. Alvy Bimler in August as scheduled and has a PET at that time as well. If difficulty swallowing worsens, the pt should call Rad-Onc.  Patient urged to increase nutritional intake; techinques for this provided.   _____________________________________   Eppie Gibson, MD  This document serves as a record of services personally performed by Eppie Gibson, MD. It was created on her behalf by Darcus Austin,  a trained medical scribe. The creation of this record is based on the scribe's personal observations and the provider's statements to them. This document has been checked and approved by the attending provider.

## 2015-04-17 ENCOUNTER — Telehealth: Payer: Self-pay | Admitting: Oncology

## 2015-04-17 NOTE — Telephone Encounter (Signed)
Called Sarh and let her know to keep hydrated and to call us Monday if her voice is still hoarse.  Marisa verbalized agreement and understanding.

## 2015-04-17 NOTE — Telephone Encounter (Signed)
Maria Simmons called and said her voice has been hoarse.  She said it started Monday night and sometimes her voice is only a whisper.  She said she has been coughing and did have a coughing spell last night and has been taking [facility] over the counter night time cough medicine as needed.  She denies having trouble swallowing or a sore throat.  She is wondering if she should be concerned about the hoarseness.  Advised her that Dr. Isidore Moos will be consulted and we will call her back.

## 2015-04-22 ENCOUNTER — Ambulatory Visit
Admission: RE | Admit: 2015-04-22 | Discharge: 2015-04-22 | Disposition: A | Discharge status: Home / Self Care | Payer: 59 | Source: Ambulatory Visit | Attending: Radiation Oncology | Admitting: Radiation Oncology

## 2015-04-22 ENCOUNTER — Encounter: Payer: Self-pay | Admitting: *Deleted

## 2015-04-22 VITALS — BP 127/76 | HR 89 | Temp 98.4°F | Resp 12 | Wt 150.1 lb

## 2015-04-22 DIAGNOSIS — C8330 Diffuse large B-cell lymphoma, unspecified site: Secondary | ICD-10-CM

## 2015-04-22 NOTE — Progress Notes (Signed)
She rates her pain as a 4 on a scale of 0-10. intermittent and aching over Neck, left sided  Pt complains of fatigue and poor appetite. Hoarse voice and reports three tender "lumps" in throat and jaw. Pt has had dysphagia for solids. Pt reports a regular unmodified diet orally, no supplements. Oral exam reveals no erythema or exudates noted. Teeth and gums normal. Sputum white and "frothy" sputum. Skin exam reveals slight Hyperpigmentation on left side of neck.  Wt Readings from Last 3 Encounters:  04/22/15 150 lb 1.6 oz (68.085 kg)  02/25/15 158 lb 8 oz (71.895 kg)  01/16/15 170 lb 11.2 oz (77.429 kg)   BP 127/76 mmHg  Pulse 89  Temp(Src) 98.4 F (36.9 C) (Oral)  Resp 12  Wt 150 lb 1.6 oz (68.085 kg)  SpO2 100%  LMP 72/62/0355  Dental (if applicable): When was last visit with dentistry. NA, not yet

## 2015-04-22 NOTE — Progress Notes (Signed)
Returned patients phone call.  Pt reports she is still having a hoarse voice and can see and feel a "knot" in her throat.  She states the hoarseness gets better but never is completely gone.  Notified Dr. Isidore Moos and waiting further instruction.

## 2015-04-22 NOTE — Progress Notes (Signed)
Radiation Oncology         [phone] ________________________________  Name: Maria Simmons MRN: [id number]  Date: 04/22/2015  DOB: June 28, 1966  Follow-Up Visit Note  Outpatient  CC: Jacelyn Pi, MD  Jacelyn Pi, MD  Diagnosis and Prior Radiotherapy:    ICD-9-CM ICD-10-CM   1. 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 intermittent and aching over Neck, left sided  Pt complains of fatigue and poor appetite. Hoarse voice and reports three tender "lumps" in throat and jaw. Pt has had dysphagia for solids. Pt reports a regular unmodified diet orally, no supplements.     Reports NEW night sweats, but could be due to thyroid medications being titrated recently with Dr Chalmers Cater.  ALLERGIES:  is allergic to metformin and related; tape; levaquin; and penicillins.  Meds: Current Outpatient Prescriptions  Medication Sig Dispense Refill  . aspirin EC 81 MG tablet Take 81 mg by mouth every morning.     . Biotin 5000 MCG TABS Take 1 tablet by mouth every morning.    . Cyanocobalamin (VITAMIN B 12 PO) Take 1 tablet by mouth every morning.     [name] HYDROcodone-acetaminophen (NORCO/VICODIN) 5-325 MG per tablet Take 1 tablet by mouth every 6 (six) hours as needed for moderate pain. Takes 1 tablet at bedtime    . ibuprofen (ADVIL,MOTRIN) 200 MG tablet Take 200 mg by mouth every 6 (six) hours as needed.    [name] levothyroxine (SYNTHROID,  LEVOTHROID) 137 MCG tablet 137 mcg daily before breakfast. Decreased to 161mcg started 02/18/15    . lidocaine (XYLOCAINE) 2 % solution Patient: Mix 1part 2% viscous lidocaine, 1part H20. Swish and/or swallow 54mL of this mixture, 59min before meals and at bedtime, up to QID 100 mL 5  . lidocaine-prilocaine (EMLA) cream Apply cream to port-a-cath 1-2 hours prior to access. (Patient not taking: Reported on 04/10/2015) 30 g 3  . Multiple Vitamin (MULITIVITAMIN WITH MINERALS) TABS Take 1 tablet by mouth every morning.     . sucralfate (CARAFATE) 1 G tablet Dissolve 1 tablet in 10 mL H20 and swallow 30 min prior to meals and bedtime. (Patient not taking: Reported on 04/10/2015) 30 tablet 5  . Vitamin D, Cholecalciferol, 1000 UNITS TABS Take 2,000 Int'l Units by mouth daily at 12 noon.     . [DISCONTINUED] calcium-vitamin D (OSCAL WITH D) 500-200 MG-UNIT per tablet Take 1 tablet by mouth daily.     No current facility-administered medications for this encounter.    Physical Findings: The patient is in no acute distress. Patient is alert and oriented. Slightly hoarse Oropharyngeal mucosa is intact with no thrush or lesions. No palpable cervical or supraclavicular lymphadenopathy. Skin intact and smooth over neck.   Slightly prominent submandibular glands, R>L.   weight is 150 lb 1.6 oz (  68.085 kg). Her oral temperature is 98.4 F (36.9 C). Her blood pressure is 127/76 and her pulse is 89. Her respiration is 12 and oxygen saturation is 100%. .      Lab Findings: Lab Results  Component Value Date   WBC 2.8* 01/04/2015   HGB 10.8* 01/04/2015   HCT 33.0* 01/04/2015   MCV 86.6 01/04/2015   PLT 340 01/04/2015   CMP     Component Value Date/Time   NA 141 01/04/2015 0907   NA 140 11/01/2014 1424   K 4.1 01/04/2015 0907   K 4.2 11/01/2014 1424   CL 99 11/01/2014 1424   CL 102 08/31/2012 1336   CO2 28 01/04/2015 0907   CO2 26 11/01/2014 1424   GLUCOSE 106 01/04/2015 0907   GLUCOSE 101*  11/01/2014 1424   GLUCOSE 95 08/31/2012 1336   BUN 7.8 01/04/2015 0907   BUN 9 11/01/2014 1424   CREATININE 0.8 01/04/2015 0907   CREATININE 0.63 11/01/2014 1424   CALCIUM 8.5 01/04/2015 0907   CALCIUM 9.0 11/01/2014 1424   PROT 6.2* 01/04/2015 0907   PROT 6.7 11/01/2014 1424   ALBUMIN 3.5 01/04/2015 0907   ALBUMIN 3.5 11/01/2014 1424   AST 15 01/04/2015 0907   AST 9 11/01/2014 1424   ALT 11 01/04/2015 0907   ALT 9 11/01/2014 1424   ALKPHOS 79 01/04/2015 0907   ALKPHOS 86 11/01/2014 1424   BILITOT 0.48 01/04/2015 0907   BILITOT 0.6 11/01/2014 1424   GFRNONAA >90 11/01/2014 1424   GFRAA >90 11/01/2014 1424   Lab Results  Component Value Date   TSH 0.012* 02/13/2015     Radiographic Findings: No results found.  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 At this point I would expect her to feel better than she is.   Reports night sweats, but could be due to thyroid medications being titrated recently with Dr Chalmers Cater.  Pt feels lumps in neck, and is hoarse. I appreciated some submandibular fullness. She denies throat soreness or cold like symptoms otherwise.   I suspect there is an inflammatory process and think it less likely that there is disease recurrence, but out of caution will order CT scans  to rule out disease recurrence.  Refer to ENT to evaluate for other etiologies of symptoms (patient has not seen an ENT since diagnosis.)  Will call pt with results of imaging.     _____________________________________   Eppie Gibson, MD

## 2015-04-23 ENCOUNTER — Telehealth: Payer: Self-pay | Admitting: *Deleted

## 2015-04-23 NOTE — Telephone Encounter (Signed)
CALLED PATIENT TO INFORM OF CTS ON 04-25-15- ARRIVAL TIME - 3:15 PM @ [facility], SPOKE WITH PATIENT AND SHE IS AWARE OF THESE TESTS

## 2015-04-23 NOTE — Telephone Encounter (Signed)
CALLED PATIENT TO INFORM OF APPT. WITH DR. Erik Obey ON 6-78-93- ARRIVAL TIME - 2:20 PM, LVM FOR A RETURN CALL

## 2015-04-25 ENCOUNTER — Ambulatory Visit (HOSPITAL_BASED_OUTPATIENT_CLINIC_OR_DEPARTMENT_OTHER)
Admission: RE | Admit: 2015-04-25 | Discharge: 2015-04-25 | Disposition: A | Discharge status: Home / Self Care | Payer: 59 | Source: Ambulatory Visit | Attending: Radiation Oncology | Admitting: Radiation Oncology

## 2015-04-25 ENCOUNTER — Ambulatory Visit (HOSPITAL_BASED_OUTPATIENT_CLINIC_OR_DEPARTMENT_OTHER): Payer: 59

## 2015-04-25 ENCOUNTER — Encounter (HOSPITAL_BASED_OUTPATIENT_CLINIC_OR_DEPARTMENT_OTHER): Payer: Self-pay

## 2015-04-25 DIAGNOSIS — E890 Postprocedural hypothyroidism: Secondary | ICD-10-CM | POA: Insufficient documentation

## 2015-04-25 DIAGNOSIS — Z9221 Personal history of antineoplastic chemotherapy: Secondary | ICD-10-CM | POA: Diagnosis not present

## 2015-04-25 DIAGNOSIS — Z85850 Personal history of malignant neoplasm of thyroid: Secondary | ICD-10-CM | POA: Diagnosis not present

## 2015-04-25 DIAGNOSIS — M5136 Other intervertebral disc degeneration, lumbar region: Secondary | ICD-10-CM | POA: Diagnosis not present

## 2015-04-25 DIAGNOSIS — Z9884 Bariatric surgery status: Secondary | ICD-10-CM | POA: Diagnosis not present

## 2015-04-25 DIAGNOSIS — Z923 Personal history of irradiation: Secondary | ICD-10-CM | POA: Insufficient documentation

## 2015-04-25 DIAGNOSIS — Z90710 Acquired absence of both cervix and uterus: Secondary | ICD-10-CM | POA: Diagnosis not present

## 2015-04-25 DIAGNOSIS — C8330 Diffuse large B-cell lymphoma, unspecified site: Secondary | ICD-10-CM | POA: Diagnosis not present

## 2015-04-25 MED ORDER — IOHEXOL 300 MG/ML  SOLN
100.0000 mL | Freq: Once | INTRAMUSCULAR | Status: AC | PRN
  Administered 2015-04-25: 100 mL via INTRAVENOUS

## 2015-05-07 ENCOUNTER — Other Ambulatory Visit: Payer: Self-pay | Admitting: *Deleted

## 2015-05-09 ENCOUNTER — Telehealth: Payer: Self-pay | Admitting: *Deleted

## 2015-05-09 ENCOUNTER — Other Ambulatory Visit: Payer: Self-pay | Admitting: *Deleted

## 2015-05-09 NOTE — Patient Outreach (Signed)
[place] [facility]) Care Management  05/09/2015  Maria Simmons 12-Apr-1966 [id number]   RN spoke with pt concerning the purpose of today's call and offered [facility] services accordingly. Pt expressed her past services concerning her cancer treatments and specialities doctors sent to both in-network and out-of-network. Inquired on a few things RN referred pt to seek explanation from her involver provider. Pt detailed with upcoming scans needed in August has a follow up on her recent radiation therapy concerning mass in her neck and the consult that took place with an ENT doctor and [place]. Pt continues to work with no problems mentioned at this time as she will follow up with her providers concerning her ongoing progress. RN mentioned services available via [facility] for community resources, social workers and pharmacy. Also discussed benefit exception if needed in the future with any recommended providers that maybe out of network for her ongoing treatment or consults. Pt appreciative for the call and does not need [facility] services at this time.  Case close for decline for [facility] services.  Raina Mina, RN Care Management Coordinator [facility] Main Office [phone]

## 2015-05-21 ENCOUNTER — Encounter: Payer: Self-pay | Admitting: *Deleted

## 2015-05-22 ENCOUNTER — Ambulatory Visit (HOSPITAL_BASED_OUTPATIENT_CLINIC_OR_DEPARTMENT_OTHER): Payer: 59

## 2015-05-22 ENCOUNTER — Encounter: Payer: Self-pay | Admitting: Radiation Oncology

## 2015-05-22 ENCOUNTER — Ambulatory Visit
Admission: RE | Admit: 2015-05-22 | Discharge: 2015-05-22 | Disposition: A | Discharge status: Home / Self Care | Payer: 59 | Source: Ambulatory Visit | Attending: Radiation Oncology | Admitting: Radiation Oncology

## 2015-05-22 ENCOUNTER — Telehealth: Payer: Self-pay | Admitting: Radiation Oncology

## 2015-05-22 VITALS — BP 123/76 | HR 86 | Temp 97.9°F | Resp 12 | Wt 142.2 lb

## 2015-05-22 DIAGNOSIS — C8330 Diffuse large B-cell lymphoma, unspecified site: Secondary | ICD-10-CM

## 2015-05-22 DIAGNOSIS — Z452 Encounter for adjustment and management of vascular access device: Secondary | ICD-10-CM

## 2015-05-22 DIAGNOSIS — Z95828 Presence of other vascular implants and grafts: Secondary | ICD-10-CM

## 2015-05-22 MED ORDER — SODIUM CHLORIDE 0.9 % IJ SOLN
10.0000 mL | INTRAMUSCULAR | Status: DC | PRN
  Administered 2015-05-22: 10 mL via INTRAVENOUS
  Filled 2015-05-22: qty 10

## 2015-05-22 MED ORDER — HEPARIN SOD (PORK) LOCK FLUSH 100 UNIT/ML IV SOLN
500.0000 [IU] | Freq: Once | INTRAVENOUS | Status: AC
  Administered 2015-05-22: 500 [IU] via INTRAVENOUS
  Filled 2015-05-22: qty 5

## 2015-05-22 NOTE — Telephone Encounter (Signed)
Left a voicemail to advise Maria Simmons to call to confirm her nutrition appt for Friday at 9:45 with Ernestene Kiel.

## 2015-05-22 NOTE — Patient Instructions (Signed)

## 2015-05-22 NOTE — Progress Notes (Addendum)
Pain Status: She is currently in no pain.  Reports "tenderness" to right neck lymph nodes  Nutritional Status a) intake: PO Diet: Regular b) using a feeding tube?: no c) weight changes, if any: She continues to loss weight even though she reports she consistently is eating well Wt Readings from Last 3 Encounters:  05/22/15 142 lb 3.2 oz (64.501 kg)  04/22/15 150 lb 1.6 oz (68.085 kg)  04/10/15 151 lb 12.8 oz (68.856 kg)    Swallowing Status: Pt denies dysphagia.  Dental (if applicable): When was last visit with dentistry? Has yet to go  Using fluoride trays daily?    When was last ENT visit? 1/63/84 [place]- she reports Bactrim and Flonase, for "sinus disease" When is next ENT visit? 06/12/15  Other notable issues, if any: denies night sweats

## 2015-05-22 NOTE — Progress Notes (Addendum)
Radiation Oncology         [phone] ________________________________  Name: Maria Simmons MRN: [id number]  Date: 05/22/2015  DOB: 21-Jan-1966  Follow-Up Visit Note  Outpatient  CC: Jacelyn Pi, MD  Jacelyn Pi, MD  Diagnosis and Prior Radiotherapy:  No diagnosis found.   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  Reports "tenderness" to right neck lymph nodes that is now mild. Feels well overall.   Nutritional Status a) intake: PO Diet: Regular b) using a feeding tube?: no c) weight changes, if any: She continues to lose weight even though she reports she consistently is eating well.  Wt Readings from Last 3 Encounters:  05/22/15 142 lb 3.2 oz (64.501 kg)  04/22/15 150 lb 1.6 oz (68.085 kg)  04/10/15 151 lb 12.8 oz (68.856 kg)    Swallowing Status: Pt denies dysphagia.   When was last ENT visit? 06/12/51 [facility]- she reports Bactrim and Flonase, for "sinus disease" that has helped. When is next ENT visit? 06/12/15  Other notable issues, if any: denies night sweats    ALLERGIES:  is allergic to metformin and related; tape; levaquin; and penicillins.  Meds: Current Outpatient Prescriptions  Medication Sig Dispense Refill  . aspirin EC 81 MG tablet Take 81 mg by mouth every morning.     . Biotin 5000 MCG TABS Take 1 tablet by mouth every morning.    . Cyanocobalamin (VITAMIN B 12 PO) Take 1 tablet by mouth every morning.     [name] HYDROcodone-acetaminophen (NORCO/VICODIN) 5-325 MG per tablet Take 1 tablet by mouth every 6 (six) hours as needed for moderate pain. Takes 1 tablet at bedtime    . ibuprofen (ADVIL,MOTRIN) 200 MG tablet Take 200 mg by mouth  every 6 (six) hours as needed.    [name] levothyroxine (SYNTHROID, LEVOTHROID) 137 MCG tablet 137 mcg daily before breakfast. Decreased to 115mcg started 02/18/15    . lidocaine (XYLOCAINE) 2 % solution Patient: Mix 1part 2% viscous lidocaine, 1part H20. Swish and/or swallow 2mL of this mixture, 35min before meals and at bedtime, up to QID (Patient not taking: Reported on 05/22/2015) 100 mL 5  . lidocaine-prilocaine (EMLA) cream Apply cream to port-a-cath 1-2 hours prior to access. (Patient not taking: Reported on 04/10/2015) 30 g 3  . Multiple Vitamin (MULITIVITAMIN WITH MINERALS) TABS Take 1 tablet by mouth every morning.     . sucralfate (CARAFATE) 1 G tablet Dissolve 1 tablet in 10 mL H20 and swallow 30 min prior to meals and bedtime. (Patient not taking: Reported on 04/10/2015) 30 tablet 5  . Vitamin D, Cholecalciferol, 1000 UNITS TABS Take 2,000 Int'l Units by mouth daily at 12 noon.     . [DISCONTINUED] calcium-vitamin D (OSCAL WITH D) 500-200 MG-UNIT per tablet Take 1 tablet by mouth daily.     No current facility-administered medications for this encounter.    Physical Findings: The patient is in no acute distress. Patient is alert and oriented. Thin. General: as above HEENT: Head is normocephalic. Extraocular movements are intact. Oropharynx is clear. Neck: Neck is supple, no palpable cervical or supraclavicular lymphadenopathy. Heart: Regular in rate and rhythm with no murmurs, rubs,  or gallops. Chest: Clear to auscultation bilaterally, with no rhonchi, wheezes, or rales. Extremities: No cyanosis or edema. Lymphatics: see Neck Exam Skin: No concerning lesions. Musculoskeletal: ambulatory. Psychiatric: Judgment and insight are intact. Affect is appropriate.     weight is 142 lb 3.2 oz (64.501 kg). Her oral temperature is 97.9 F (36.6 C). Her blood pressure is 123/76 and her pulse is 86. Her respiration is 12 and oxygen saturation is 100%. .      Lab Findings: Lab Results  Component  Value Date   WBC 2.8* 01/04/2015   HGB 10.8* 01/04/2015   HCT 33.0* 01/04/2015   MCV 86.6 01/04/2015   PLT 340 01/04/2015   CMP     Component Value Date/Time   NA 141 01/04/2015 0907   NA 140 11/01/2014 1424   K 4.1 01/04/2015 0907   K 4.2 11/01/2014 1424   CL 99 11/01/2014 1424   CL 102 08/31/2012 1336   CO2 28 01/04/2015 0907   CO2 26 11/01/2014 1424   GLUCOSE 106 01/04/2015 0907   GLUCOSE 101* 11/01/2014 1424   GLUCOSE 95 08/31/2012 1336   BUN 7.8 01/04/2015 0907   BUN 9 11/01/2014 1424   CREATININE 0.8 01/04/2015 0907   CREATININE 0.63 11/01/2014 1424   CALCIUM 8.5 01/04/2015 0907   CALCIUM 9.0 11/01/2014 1424   PROT 6.2* 01/04/2015 0907   PROT 6.7 11/01/2014 1424   ALBUMIN 3.5 01/04/2015 0907   ALBUMIN 3.5 11/01/2014 1424   AST 15 01/04/2015 0907   AST 9 11/01/2014 1424   ALT 11 01/04/2015 0907   ALT 9 11/01/2014 1424   ALKPHOS 79 01/04/2015 0907   ALKPHOS 86 11/01/2014 1424   BILITOT 0.48 01/04/2015 0907   BILITOT 0.6 11/01/2014 1424   GFRNONAA >90 11/01/2014 1424   GFRAA >90 11/01/2014 1424   Lab Results  Component Value Date   TSH 0.012* 02/13/2015     Radiographic Findings: Ct Soft Tissue Neck W Contrast  04/25/2015   CLINICAL DATA:  Diffuse large B-cell lymphoma, palpable abnormality is tube bilateral neck status post radiation/ chemotherapy in March. Left neck mass removed in October/November. History of thyroid cancer status post resection  EXAM: CT NECK, CHEST, ABDOMEN, AND PELVIS WITH CONTRAST  TECHNIQUE: Multidetector CT imaging of the neck, chest, abdomen and pelvis was performed following the standard protocol during bolus administration of intravenous contrast.  CONTRAST:  16mL OMNIPAQUE IOHEXOL 300 MG/ML  SOLN  COMPARISON:  PET-CT dated 01/04/2015 and 09/14/2014.  FINDINGS: CT NECK FINDINGS  Pharynx and larynx: Within normal limits.  Salivary glands: Within normal limits. Two marked palpable abnormalities correspond to the bilateral submandibular  glands (series 8/ images 53 and 56).  Thyroid: Surgically absent. No abnormal soft tissue in the surgical bed.  Lymph nodes: No suspicious cervical lymphadenopathy.  Vascular: Within normal limits.  Limited intracranial: Within normal limits.  Visualized orbits: Visualize globes and retroconal soft tissues are within normal limits.  Mastoids and visualized paranasal sinuses: Partial opacification of the bilateral maxillary, ethmoid, and sphenoid sinuses. Mastoid air cells are clear.  Skeleton: No focal osseous lesions.  Third palpable abnormality corresponds to the left sternocleidomastoid muscle (series 8/ image 68).  CT CHEST FINDINGS  Mediastinum/Nodes: Heart is normal in size. No pericardial effusion.  No suspicious mediastinal, hilar, or axillary lymphadenopathy.  Right chest port terminates at the cavoatrial junction.  Lungs/Pleura: Lungs are clear.  No suspicious pulmonary nodules.  No focal consolidation.  No pleural effusion or pneumothorax.  Musculoskeletal: Degenerative changes of  the thoracic spine.  CT ABDOMEN PELVIS FINDINGS  Hepatobiliary: Liver is within normal limits. No suspicious/enhancing hepatic lesions.  Gallbladder is unremarkable. No intrahepatic or extrahepatic ductal dilatation.  Pancreas: Within normal limits.  Spleen: Spleen is normal in size.  Adrenals/Urinary Tract: Adrenal glands are within normal limits.  Kidneys are within normal limits.  No hydronephrosis.  Bladder is within normal limits.  Stomach/Bowel: Stomach is notable for a laparoscopic band in satisfactory position.  No evidence of bowel obstruction.  Normal appendix.  Vascular/Lymphatic: No evidence of abdominal aortic aneurysm.  No suspicious abdominopelvic lymphadenopathy.  Reproductive: Status post hysterectomy.  Bilateral ovaries are within normal limits.  Other: Small volume pelvic ascites.  Musculoskeletal: Degenerative changes of the lumbar spine.  IMPRESSION: No findings suspicious for active lymphomatous  involvement.  Palpable abnormalities correspond to the bilateral submandibular glands and the left sternocleidomastoid muscle.  Additional ancillary findings as above.   Electronically Signed   By: Julian Hy M.D.   On: 04/25/2015 16:40   Ct Chest W Contrast  04/25/2015   CLINICAL DATA:  Diffuse large B-cell lymphoma, palpable abnormality is tube bilateral neck status post radiation/ chemotherapy in March. Left neck mass removed in October/November. History of thyroid cancer status post resection  EXAM: CT NECK, CHEST, ABDOMEN, AND PELVIS WITH CONTRAST  TECHNIQUE: Multidetector CT imaging of the neck, chest, abdomen and pelvis was performed following the standard protocol during bolus administration of intravenous contrast.  CONTRAST:  113mL OMNIPAQUE IOHEXOL 300 MG/ML  SOLN  COMPARISON:  PET-CT dated 01/04/2015 and 09/14/2014.  FINDINGS: CT NECK FINDINGS  Pharynx and larynx: Within normal limits.  Salivary glands: Within normal limits. Two marked palpable abnormalities correspond to the bilateral submandibular glands (series 8/ images 53 and 56).  Thyroid: Surgically absent. No abnormal soft tissue in the surgical bed.  Lymph nodes: No suspicious cervical lymphadenopathy.  Vascular: Within normal limits.  Limited intracranial: Within normal limits.  Visualized orbits: Visualize globes and retroconal soft tissues are within normal limits.  Mastoids and visualized paranasal sinuses: Partial opacification of the bilateral maxillary, ethmoid, and sphenoid sinuses. Mastoid air cells are clear.  Skeleton: No focal osseous lesions.  Third palpable abnormality corresponds to the left sternocleidomastoid muscle (series 8/ image 68).  CT CHEST FINDINGS  Mediastinum/Nodes: Heart is normal in size. No pericardial effusion.  No suspicious mediastinal, hilar, or axillary lymphadenopathy.  Right chest port terminates at the cavoatrial junction.  Lungs/Pleura: Lungs are clear.  No suspicious pulmonary nodules.  No focal  consolidation.  No pleural effusion or pneumothorax.  Musculoskeletal: Degenerative changes of the thoracic spine.  CT ABDOMEN PELVIS FINDINGS  Hepatobiliary: Liver is within normal limits. No suspicious/enhancing hepatic lesions.  Gallbladder is unremarkable. No intrahepatic or extrahepatic ductal dilatation.  Pancreas: Within normal limits.  Spleen: Spleen is normal in size.  Adrenals/Urinary Tract: Adrenal glands are within normal limits.  Kidneys are within normal limits.  No hydronephrosis.  Bladder is within normal limits.  Stomach/Bowel: Stomach is notable for a laparoscopic band in satisfactory position.  No evidence of bowel obstruction.  Normal appendix.  Vascular/Lymphatic: No evidence of abdominal aortic aneurysm.  No suspicious abdominopelvic lymphadenopathy.  Reproductive: Status post hysterectomy.  Bilateral ovaries are within normal limits.  Other: Small volume pelvic ascites.  Musculoskeletal: Degenerative changes of the lumbar spine.  IMPRESSION: No findings suspicious for active lymphomatous involvement.  Palpable abnormalities correspond to the bilateral submandibular glands and the left sternocleidomastoid muscle.  Additional ancillary findings as above.   Electronically Signed  By: Julian Hy M.D.   On: 04/25/2015 16:40   Ct Abdomen Pelvis W Contrast  04/25/2015   CLINICAL DATA:  Diffuse large B-cell lymphoma, palpable abnormality is tube bilateral neck status post radiation/ chemotherapy in March. Left neck mass removed in October/November. History of thyroid cancer status post resection  EXAM: CT NECK, CHEST, ABDOMEN, AND PELVIS WITH CONTRAST  TECHNIQUE: Multidetector CT imaging of the neck, chest, abdomen and pelvis was performed following the standard protocol during bolus administration of intravenous contrast.  CONTRAST:  18mL OMNIPAQUE IOHEXOL 300 MG/ML  SOLN  COMPARISON:  PET-CT dated 01/04/2015 and 09/14/2014.  FINDINGS: CT NECK FINDINGS  Pharynx and larynx: Within normal  limits.  Salivary glands: Within normal limits. Two marked palpable abnormalities correspond to the bilateral submandibular glands (series 8/ images 53 and 56).  Thyroid: Surgically absent. No abnormal soft tissue in the surgical bed.  Lymph nodes: No suspicious cervical lymphadenopathy.  Vascular: Within normal limits.  Limited intracranial: Within normal limits.  Visualized orbits: Visualize globes and retroconal soft tissues are within normal limits.  Mastoids and visualized paranasal sinuses: Partial opacification of the bilateral maxillary, ethmoid, and sphenoid sinuses. Mastoid air cells are clear.  Skeleton: No focal osseous lesions.  Third palpable abnormality corresponds to the left sternocleidomastoid muscle (series 8/ image 68).  CT CHEST FINDINGS  Mediastinum/Nodes: Heart is normal in size. No pericardial effusion.  No suspicious mediastinal, hilar, or axillary lymphadenopathy.  Right chest port terminates at the cavoatrial junction.  Lungs/Pleura: Lungs are clear.  No suspicious pulmonary nodules.  No focal consolidation.  No pleural effusion or pneumothorax.  Musculoskeletal: Degenerative changes of the thoracic spine.  CT ABDOMEN PELVIS FINDINGS  Hepatobiliary: Liver is within normal limits. No suspicious/enhancing hepatic lesions.  Gallbladder is unremarkable. No intrahepatic or extrahepatic ductal dilatation.  Pancreas: Within normal limits.  Spleen: Spleen is normal in size.  Adrenals/Urinary Tract: Adrenal glands are within normal limits.  Kidneys are within normal limits.  No hydronephrosis.  Bladder is within normal limits.  Stomach/Bowel: Stomach is notable for a laparoscopic band in satisfactory position.  No evidence of bowel obstruction.  Normal appendix.  Vascular/Lymphatic: No evidence of abdominal aortic aneurysm.  No suspicious abdominopelvic lymphadenopathy.  Reproductive: Status post hysterectomy.  Bilateral ovaries are within normal limits.  Other: Small volume pelvic ascites.   Musculoskeletal: Degenerative changes of the lumbar spine.  IMPRESSION: No findings suspicious for active lymphomatous involvement.  Palpable abnormalities correspond to the bilateral submandibular glands and the left sternocleidomastoid muscle.  Additional ancillary findings as above.   Electronically Signed   By: Julian Hy M.D.   On: 04/25/2015 16:40    Impression/Plan: Her CT scans last month were reassuring and she feels well.  She has healed from radiotherapy.  Main concern is weight loss. This could be due to hormonal imbalance (thyroid medication is being titrated by her endocrinologist.)  Disease recurrence is less likely but not impossible.  Recommend: 1) refer back to nutritionist 2) pt to call endocrinologist to see if this month's appt should be moved up 3) f/u with med/onc in August with PET as scheduled  I encouraged her to continue followup with medical oncology regulary. I will see her back on an as-needed basis. I have encouraged her to call if she has any issues or concerns in the future. I wished her the very best.     _____________________________________   Eppie Gibson, MD

## 2015-05-24 ENCOUNTER — Encounter: Payer: 59 | Admitting: Nutrition

## 2015-05-28 ENCOUNTER — Encounter: Payer: Self-pay | Admitting: Nutrition

## 2015-05-28 NOTE — Progress Notes (Signed)
Patient called and cancelled nutrition appointment because she cannot get off work. Stated she would call later to reschedule.

## 2015-05-29 ENCOUNTER — Encounter: Payer: 59 | Admitting: Nutrition

## 2015-06-12 ENCOUNTER — Telehealth: Payer: Self-pay | Admitting: *Deleted

## 2015-06-12 ENCOUNTER — Other Ambulatory Visit (HOSPITAL_COMMUNITY): Payer: Self-pay | Admitting: Otolaryngology

## 2015-06-12 DIAGNOSIS — J324 Chronic pansinusitis: Secondary | ICD-10-CM

## 2015-06-12 NOTE — Telephone Encounter (Signed)
  Oncology Nurse Navigator Documentation  Oncology Nurse Navigator Flowsheets 06/12/2015  Navigator Encounter Type Telephone;3 month  Patient Visit Type Follow-up  Called patient for 62-month follow-up s/p RT completion in mid-March.  LVM with request to return my call.  Time Spent with Patient [place], RN, BSN, [place] at [place] [phone]

## 2015-06-19 ENCOUNTER — Ambulatory Visit (HOSPITAL_COMMUNITY)
Admission: RE | Admit: 2015-06-19 | Discharge: 2015-06-19 | Disposition: A | Discharge status: Home / Self Care | Payer: 59 | Source: Ambulatory Visit | Attending: Otolaryngology | Admitting: Otolaryngology

## 2015-06-19 DIAGNOSIS — J324 Chronic pansinusitis: Secondary | ICD-10-CM

## 2015-06-19 DIAGNOSIS — J019 Acute sinusitis, unspecified: Secondary | ICD-10-CM | POA: Diagnosis not present

## 2015-06-19 DIAGNOSIS — J342 Deviated nasal septum: Secondary | ICD-10-CM | POA: Insufficient documentation

## 2015-06-19 DIAGNOSIS — J343 Hypertrophy of nasal turbinates: Secondary | ICD-10-CM | POA: Insufficient documentation

## 2015-06-25 ENCOUNTER — Encounter: Payer: Self-pay | Admitting: Genetic Counselor

## 2015-07-16 ENCOUNTER — Other Ambulatory Visit: Payer: 59

## 2015-07-16 ENCOUNTER — Ambulatory Visit (HOSPITAL_COMMUNITY)
Admission: RE | Admit: 2015-07-16 | Discharge: 2015-07-16 | Disposition: A | Discharge status: Home / Self Care | Payer: 59 | Source: Ambulatory Visit | Attending: Hematology and Oncology | Admitting: Hematology and Oncology

## 2015-07-16 ENCOUNTER — Ambulatory Visit (HOSPITAL_BASED_OUTPATIENT_CLINIC_OR_DEPARTMENT_OTHER): Payer: 59

## 2015-07-16 ENCOUNTER — Other Ambulatory Visit (HOSPITAL_BASED_OUTPATIENT_CLINIC_OR_DEPARTMENT_OTHER): Payer: 59

## 2015-07-16 VITALS — BP 124/79 | HR 69 | Temp 98.9°F | Resp 16

## 2015-07-16 DIAGNOSIS — C8590 Non-Hodgkin lymphoma, unspecified, unspecified site: Secondary | ICD-10-CM | POA: Diagnosis present

## 2015-07-16 DIAGNOSIS — C8330 Diffuse large B-cell lymphoma, unspecified site: Secondary | ICD-10-CM

## 2015-07-16 DIAGNOSIS — Z9884 Bariatric surgery status: Secondary | ICD-10-CM | POA: Insufficient documentation

## 2015-07-16 DIAGNOSIS — Z95828 Presence of other vascular implants and grafts: Secondary | ICD-10-CM

## 2015-07-16 DIAGNOSIS — M899 Disorder of bone, unspecified: Secondary | ICD-10-CM | POA: Insufficient documentation

## 2015-07-16 LAB — CBC WITH DIFFERENTIAL/PLATELET
BASO%: 1.3 % (ref 0.0–2.0)
BASOS ABS: 0 10*3/uL (ref 0.0–0.1)
EOS ABS: 0.1 10*3/uL (ref 0.0–0.5)
EOS%: 4.3 % (ref 0.0–7.0)
HEMATOCRIT: 34.3 % — AB (ref 34.8–46.6)
HGB: 11.2 g/dL — ABNORMAL LOW (ref 11.6–15.9)
LYMPH%: 23.2 % (ref 14.0–49.7)
MCH: 28.1 pg (ref 25.1–34.0)
MCHC: 32.7 g/dL (ref 31.5–36.0)
MCV: 86.1 fL (ref 79.5–101.0)
MONO#: 0.3 10*3/uL (ref 0.1–0.9)
MONO%: 14.5 % — ABNORMAL HIGH (ref 0.0–14.0)
NEUT#: 1.2 10*3/uL — ABNORMAL LOW (ref 1.5–6.5)
NEUT%: 56.7 % (ref 38.4–76.8)
Platelets: 257 10*3/uL (ref 145–400)
RBC: 3.98 10*6/uL (ref 3.70–5.45)
RDW: 13.8 % (ref 11.2–14.5)
WBC: 2.1 10*3/uL — ABNORMAL LOW (ref 3.9–10.3)
lymph#: 0.5 10*3/uL — ABNORMAL LOW (ref 0.9–3.3)

## 2015-07-16 LAB — COMPREHENSIVE METABOLIC PANEL (CC13)
ALT: 11 U/L (ref 0–55)
AST: 15 U/L (ref 5–34)
Albumin: 3.6 g/dL (ref 3.5–5.0)
Alkaline Phosphatase: 56 U/L (ref 40–150)
Anion Gap: 6 mEq/L (ref 3–11)
BILIRUBIN TOTAL: 0.42 mg/dL (ref 0.20–1.20)
BUN: 13.5 mg/dL (ref 7.0–26.0)
CALCIUM: 8.9 mg/dL (ref 8.4–10.4)
CHLORIDE: 107 meq/L (ref 98–109)
CO2: 27 mEq/L (ref 22–29)
CREATININE: 0.8 mg/dL (ref 0.6–1.1)
EGFR: >90 mL/min/{1.73_m2} (ref >90)
Glucose: 105 mg/dl (ref 70–140)
POTASSIUM: 3.9 meq/L (ref 3.5–5.1)
SODIUM: 140 meq/L (ref 136–145)
TOTAL PROTEIN: 6.3 g/dL — AB (ref 6.4–8.3)

## 2015-07-16 LAB — LACTATE DEHYDROGENASE (CC13): LDH: 142 U/L (ref 125–245)

## 2015-07-16 LAB — GLUCOSE, CAPILLARY: Glucose-Capillary: 79 mg/dL (ref 65–99)

## 2015-07-16 MED ORDER — FLUDEOXYGLUCOSE F - 18 (FDG) INJECTION
7.2000 | Freq: Once | INTRAVENOUS | Status: AC | PRN
  Administered 2015-07-16: 7.2 via INTRAVENOUS

## 2015-07-16 MED ORDER — SODIUM CHLORIDE 0.9 % IJ SOLN
10.0000 mL | INTRAMUSCULAR | Status: DC | PRN
  Administered 2015-07-16: 10 mL via INTRAVENOUS
  Filled 2015-07-16: qty 10

## 2015-07-16 NOTE — Patient Instructions (Signed)
Please have port-a-cath needle deaccessed after PET scan today before leaving [facility] hospital.   [place] An implanted port is a type of central line that is placed under the skin. Central lines are used to provide IV access when treatment or nutrition needs to be given through a person's veins. Implanted ports are used for long-term IV access. An implanted port may be placed because:   You need IV medicine that would be irritating to the small veins in your hands or arms.   You need long-term IV medicines, such as antibiotics.   You need IV nutrition for a long period.   You need frequent blood draws for lab tests.   You need dialysis.  Implanted ports are usually placed in the chest area, but they can also be placed in the upper arm, the abdomen, or the leg. An implanted port has two main parts:   Reservoir. The reservoir is round and will appear as a small, raised area under your skin. The reservoir is the part where a needle is inserted to give medicines or draw blood.   Catheter. The catheter is a thin, flexible tube that extends from the reservoir. The catheter is placed into a large vein. Medicine that is inserted into the reservoir goes into the catheter and then into the vein.  HOW WILL I CARE FOR MY INCISION SITE? Do not get the incision site wet. Bathe or shower as directed by your health care provider.  HOW IS MY PORT ACCESSED? Special steps must be taken to access the port:   Before the port is accessed, a numbing cream can be placed on the skin. This helps numb the skin over the port site.   Your health care provider uses a sterile technique to access the port.  Your health care provider must put on a mask and sterile gloves.  The skin over your port is cleaned carefully with an antiseptic and allowed to dry.  The port is gently pinched between sterile gloves, and a needle is inserted into the port.  Only "non-coring" port needles should be used  to access the port. Once the port is accessed, a blood return should be checked. This helps ensure that the port is in the vein and is not clogged.   If your port needs to remain accessed for a constant infusion, a clear (transparent) bandage will be placed over the needle site. The bandage and needle will need to be changed every week, or as directed by your health care provider.   Keep the bandage covering the needle clean and dry. Do not get it wet. Follow your health care provider's instructions on how to take a shower or bath while the port is accessed.   If your port does not need to stay accessed, no bandage is needed over the port.  WHAT IS FLUSHING? Flushing helps keep the port from getting clogged. Follow your health care provider's instructions on how and when to flush the port. Ports are usually flushed with saline solution or a medicine called heparin. The need for flushing will depend on how the port is used.   If the port is used for intermittent medicines or blood draws, the port will need to be flushed:   After medicines have been given.   After blood has been drawn.   As part of routine maintenance.   If a constant infusion is running, the port may not need to be flushed.  HOW LONG WILL MY  PORT STAY IMPLANTED? The port can stay in for as long as your health care provider thinks it is needed. When it is time for the port to come out, surgery will be done to remove it. The procedure is similar to the one performed when the port was put in.  WHEN SHOULD I SEEK IMMEDIATE MEDICAL CARE? When you have an implanted port, you should seek immediate medical care if:   You notice a bad smell coming from the incision site.   You have swelling, redness, or drainage at the incision site.   You have more swelling or pain at the port site or the surrounding area.   You have a fever that is not controlled with medicine. Document Released: 11/30/2005 Document Revised:  09/20/2013 Document Reviewed: 08/07/2013 [facility] Patient Information 2015 [place], [place]. This information is not intended to replace advice given to you by your health care provider. Make sure you discuss any questions you have with your health care provider.

## 2015-07-18 ENCOUNTER — Ambulatory Visit (HOSPITAL_BASED_OUTPATIENT_CLINIC_OR_DEPARTMENT_OTHER): Payer: 59 | Admitting: Hematology and Oncology

## 2015-07-18 ENCOUNTER — Encounter: Payer: Self-pay | Admitting: Hematology and Oncology

## 2015-07-18 ENCOUNTER — Telehealth: Payer: Self-pay | Admitting: Hematology and Oncology

## 2015-07-18 VITALS — BP 151/87 | HR 88 | Temp 98.6°F | Resp 18 | Ht 63.0 in | Wt 137.8 lb

## 2015-07-18 DIAGNOSIS — D72819 Decreased white blood cell count, unspecified: Secondary | ICD-10-CM

## 2015-07-18 DIAGNOSIS — D638 Anemia in other chronic diseases classified elsewhere: Secondary | ICD-10-CM

## 2015-07-18 DIAGNOSIS — M25511 Pain in right shoulder: Secondary | ICD-10-CM | POA: Diagnosis not present

## 2015-07-18 DIAGNOSIS — C8330 Diffuse large B-cell lymphoma, unspecified site: Secondary | ICD-10-CM | POA: Diagnosis not present

## 2015-07-18 NOTE — Assessment & Plan Note (Signed)
This is likely anemia of chronic disease. The patient denies recent history of bleeding such as epistaxis, hematuria or hematochezia. She is asymptomatic from the anemia. We will observe for now.  She does not require transfusion now.   

## 2015-07-18 NOTE — Telephone Encounter (Signed)
Gave and printed appts ched and avs for pt for DEC  °

## 2015-07-18 NOTE — Progress Notes (Signed)
[place] OFFICE PROGRESS NOTE  Patient Care Team: Jacelyn Pi, MD as PCP - General (Endocrinology) Megan Salon, MD as Consulting Physician (Gynecology) Heath Lark, MD as Consulting Physician (Hematology and Oncology) Leota Sauers, RN as Oncology Nurse Navigator Eppie Gibson, MD as Attending Physician (Radiation Oncology) Frederik Pear, MD as Consulting Physician (Orthopedic Surgery)  SUMMARY OF ONCOLOGIC HISTORY: Oncology History   DLBCL (diffuse large B cell lymphoma)   Staging form: Lymphoid Neoplasms, AJCC 6th Edition     Clinical stage from 11/15/2014: Stage II - Signed by Heath Lark, MD on 11/15/2014       DLBCL (diffuse large B cell lymphoma)   09/14/2014 Imaging PET/CT scan showed Hypermetabolic bilateral cervical lymphadenopathy, left side greater than right, and sub-cm hypermetabolic left subpectoral lymph node. Hypermetabolic activity throughout the lingular and palatine tonsils in Waldeyer's ring   10/12/2014 Procedure Biopsy of the left neck lymph node came back positive for diffuse large B-cell lymphoma.Accession: [id number]   10/24/2014 Bone Marrow Biopsy Bone marrow biopsy was negative.Cytogenetics were normal   10/24/2014 Imaging Echocardiogram showed normal ejection fraction   10/25/2014 - 12/06/2014 Chemotherapy She received 3 cyles of R CHOP chemotherapy. Further doses of R CHOP chemotherapy was reduced due to recent neutropenic fever with cycle 1 of treatment.   11/01/2014 Adverse Reaction She was seen in the emergency Department for possible neutropenic fever. Cultures were negative. She received antibiotic therapy.   11/15/2014 - 12/06/2014 Chemotherapy Intrathecal chemotherapy is added due to risk of CNS relapse   01/04/2015 Imaging PET CT scan show complete remission from lymphadenopathy. Incidental finding of right shoulder arthropathy   01/16/2015 Imaging MRI of the right shoulder show no evidence of cancer, with evidence of tendon tear and  possible changes related to trauma   02/05/2015 - 02/27/2015 Radiation Therapy IMRT to tonsils, neck and L subpectoral nodal bed:  30.6 Gy in 17 fractions.   04/25/2015 Imaging CT neck, chest, abdomen/pelvis:  No findings for active lymphomatous involvement.   07/17/2015 Imaging PET/ CT scans show no evidence of lymphoma. It showed persistent abnormality in the right humeral area    INTERVAL HISTORY: Please see below for problem oriented charting.  she returns for further follow-up. She denies any new symptoms. No recent sinus infection. No new lymphadenopathy. She denies any shoulder pain.  REVIEW OF SYSTEMS:   Constitutional: Denies fevers, chills or abnormal weight loss Eyes: Denies blurriness of vision Ears, nose, mouth, throat, and face: Denies mucositis or sore throat Respiratory: Denies cough, dyspnea or wheezes Cardiovascular: Denies palpitation, chest discomfort or lower extremity swelling Gastrointestinal:  Denies nausea, heartburn or change in bowel habits Skin: Denies abnormal skin rashes Lymphatics: Denies new lymphadenopathy or easy bruising Neurological:Denies numbness, tingling or new weaknesses Behavioral/Psych: Mood is stable, no new changes  All other systems were reviewed with the patient and are negative.  I have reviewed the past medical history, past surgical history, social history and family history with the patient and they are unchanged from previous note.  ALLERGIES:  is allergic to metformin and related; tape; levaquin; and penicillins.  MEDICATIONS:  Current Outpatient Prescriptions  Medication Sig Dispense Refill  . aspirin EC 81 MG tablet Take 81 mg by mouth every morning.     . Biotin 5000 MCG TABS Take 1 tablet by mouth every morning.    . Cyanocobalamin (VITAMIN B 12 PO) Take 1 tablet by mouth every morning.     [name] levothyroxine (SYNTHROID, LEVOTHROID) 100 MCG tablet Take 100 mcg  by mouth daily before breakfast.    . Multiple Vitamin (MULITIVITAMIN  WITH MINERALS) TABS Take 1 tablet by mouth every morning.     . Vitamin D, Cholecalciferol, 1000 UNITS TABS Take 2,000 Int'l Units by mouth daily at 12 noon.     . [DISCONTINUED] calcium-vitamin D (OSCAL WITH D) 500-200 MG-UNIT per tablet Take 1 tablet by mouth daily.     No current facility-administered medications for this visit.    PHYSICAL EXAMINATION: ECOG PERFORMANCE STATUS: 0 - Asymptomatic  Filed Vitals:   07/18/15 1302  BP: 151/87  Pulse: 88  Temp: 98.6 F (37 C)  Resp: 18   Filed Weights   07/18/15 1302  Weight: 137 lb 12.8 oz (62.506 kg)    GENERAL:alert, no distress and comfortable SKIN: skin color, texture, turgor are normal, no rashes or significant lesions EYES: normal, Conjunctiva are pink and non-injected, sclera clear Musculoskeletal:no cyanosis of digits and no clubbing  NEURO: alert & oriented x 3 with fluent speech, no focal motor/sensory deficits  LABORATORY DATA:  I have reviewed the data as listed    Component Value Date/Time   NA 140 07/16/2015 0806   NA 140 11/01/2014 1424   K 3.9 07/16/2015 0806   K 4.2 11/01/2014 1424   CL 99 11/01/2014 1424   CL 102 08/31/2012 1336   CO2 27 07/16/2015 0806   CO2 26 11/01/2014 1424   GLUCOSE 105 07/16/2015 0806   GLUCOSE 101* 11/01/2014 1424   GLUCOSE 95 08/31/2012 1336   BUN 13.5 07/16/2015 0806   BUN 9 11/01/2014 1424   CREATININE 0.8 07/16/2015 0806   CREATININE 0.63 11/01/2014 1424   CALCIUM 8.9 07/16/2015 0806   CALCIUM 9.0 11/01/2014 1424   PROT 6.3* 07/16/2015 0806   PROT 6.7 11/01/2014 1424   ALBUMIN 3.6 07/16/2015 0806   ALBUMIN 3.5 11/01/2014 1424   AST 15 07/16/2015 0806   AST 9 11/01/2014 1424   ALT 11 07/16/2015 0806   ALT 9 11/01/2014 1424   ALKPHOS 56 07/16/2015 0806   ALKPHOS 86 11/01/2014 1424   BILITOT 0.42 07/16/2015 0806   BILITOT 0.6 11/01/2014 1424   GFRNONAA >90 11/01/2014 1424   GFRAA >90 11/01/2014 1424    No results found for: SPEP, UPEP  Lab Results  Component  Value Date   WBC 2.1* 07/16/2015   NEUTROABS 1.2* 07/16/2015   HGB 11.2* 07/16/2015   HCT 34.3* 07/16/2015   MCV 86.1 07/16/2015   PLT 257 07/16/2015      Chemistry      Component Value Date/Time   NA 140 07/16/2015 0806   NA 140 11/01/2014 1424   K 3.9 07/16/2015 0806   K 4.2 11/01/2014 1424   CL 99 11/01/2014 1424   CL 102 08/31/2012 1336   CO2 27 07/16/2015 0806   CO2 26 11/01/2014 1424   BUN 13.5 07/16/2015 0806   BUN 9 11/01/2014 1424   CREATININE 0.8 07/16/2015 0806   CREATININE 0.63 11/01/2014 1424      Component Value Date/Time   CALCIUM 8.9 07/16/2015 0806   CALCIUM 9.0 11/01/2014 1424   ALKPHOS 56 07/16/2015 0806   ALKPHOS 86 11/01/2014 1424   AST 15 07/16/2015 0806   AST 9 11/01/2014 1424   ALT 11 07/16/2015 0806   ALT 9 11/01/2014 1424   BILITOT 0.42 07/16/2015 0806   BILITOT 0.6 11/01/2014 1424       RADIOGRAPHIC STUDIES: I reviewed the PET CT scan with her. I have personally reviewed the  radiological images as listed and agreed with the findings in the report.  ASSESSMENT & PLAN:  DLBCL (diffuse large B cell lymphoma) She has complete response to treatment.  I will see her back in 4 months or repeat history, physical examination and blood work. There is no role for routine imaging study in the future.  Once we confirmed from the orthopedic office that nothing needs to be done with her right shoulder area, will get the port removed.   Anemia in chronic illness This is likely anemia of chronic disease. The patient denies recent history of bleeding such as epistaxis, hematuria or hematochezia. She is asymptomatic from the anemia. We will observe for now.  She does not require transfusion now.    Chronic leukopenia  The cause is unknown. She is not symptomatic. Recommend observation only.  Right shoulder pain  She had intermittent shoulder pain. PET CT scan show persistent lesion around the right humeral head. I recommend orthopedic  consultation. I will get copies of her PET/CT scan made. We will refer her to an orthopedic surgeon.   Orders Placed This Encounter  Procedures  . CBC with Differential/Platelet    Standing Status: Future     Number of Occurrences:      Standing Expiration Date: 08/21/2016  . Comprehensive metabolic panel    Standing Status: Future     Number of Occurrences:      Standing Expiration Date: 08/21/2016  . Lactate dehydrogenase    Standing Status: Future     Number of Occurrences:      Standing Expiration Date: 08/21/2016  . AMB referral to orthopedics    Referral Priority:  Routine    Referral Type:  Consultation    Number of Visits Requested:  1   All questions were answered. The patient knows to call the clinic with any problems, questions or concerns. No barriers to learning was detected. I spent 20 minutes counseling the patient face to face. The total time spent in the appointment was 30 minutes and more than 50% was on counseling and review of test results     [facility], Genia Perin, MD 07/18/2015 1:31 PM

## 2015-07-18 NOTE — Assessment & Plan Note (Signed)
The cause is unknown. She is not symptomatic. Recommend observation only.

## 2015-07-18 NOTE — Assessment & Plan Note (Signed)
She had intermittent shoulder pain. PET CT scan show persistent lesion around the right humeral head. I recommend orthopedic consultation. I will get copies of her PET/CT scan made. We will refer her to an orthopedic surgeon.

## 2015-07-18 NOTE — Assessment & Plan Note (Signed)
She has complete response to treatment.  I will see her back in 4 months or repeat history, physical examination and blood work. There is no role for routine imaging study in the future.  Once we confirmed from the orthopedic office that nothing needs to be done with her right shoulder area, will get the port removed.

## 2015-08-30 ENCOUNTER — Telehealth: Payer: Self-pay | Admitting: *Deleted

## 2015-08-30 NOTE — Telephone Encounter (Signed)
Pt left VM states she is "cleared" by her Ortho MD to have her PAC removed.  She asks if Dr. Alvy Bimler will order her [facility] removed?

## 2015-08-30 NOTE — Telephone Encounter (Signed)
Called Dr. Darrel Hoover office to request appt for [facility] removal.  S/w Scheduler named Pamala Hurry.  She will send message to Dr. Dalbert Batman and his nurse and they will contact pt for appt.  Left Vm for pt informing her to expect call from Surgeon's office or she can call them for appointment.

## 2015-08-30 NOTE — Telephone Encounter (Signed)
I will send a note to Dr. Dalbert Batman Can you also call his office to get the port removed? Thanks

## 2015-09-04 ENCOUNTER — Other Ambulatory Visit: Payer: Self-pay | Admitting: General Surgery

## 2015-09-05 ENCOUNTER — Telehealth: Payer: Self-pay | Admitting: *Deleted

## 2015-09-05 NOTE — Telephone Encounter (Signed)
  Oncology Nurse Navigator Documentation   Navigator Encounter Type: Telephone (09/05/15 0945) Patient Visit Type: Follow-up (09/05/15 0945)     Called pt to check on status of appt for PAC removal.  LVM requesting call back.  Gayleen Orem, RN, BSN, [place] at [place] [phone]                   Time Spent with Patient: 15 (09/05/15 0945)

## 2015-09-10 ENCOUNTER — Encounter: Payer: Self-pay | Admitting: *Deleted

## 2015-09-10 NOTE — Progress Notes (Signed)
  Oncology Nurse Navigator Documentation   Navigator Encounter Type: Telephone;6 month (09/10/15 1420) Patient Visit Type: Follow-up (09/10/15 1420)     I returned Delylah's VMM in which she indicated PAC is scheduled to be removed on 10/18/15. I asked about her wellbeing since completing tmts 6 months ago.  She reported that she is feeling fine, though seeing her PCP Dr. Chalmers Cater regularly for monitoring of a hyperactive thyroid, including an appt this week.  She will have post-tmt records through this week's apt faxed to my attention for scanning into her MR. I encouraged her to contact me with needs/concerns prior to her 11/18/15 f/u appt with Dr. Alvy Bimler.  She verbalized understanding.  Gayleen Orem, RN, BSN, [place] at [place] [phone]                    Time Spent with Patient: 15 (09/10/15 1420)

## 2015-10-12 NOTE — H&P (Signed)
  Maria Simmons  Location: [facility] Patient #: [id number] DOB: May 11, 1966 Divorced / Language: English / Race: Black or African American Female       History of Present Illness   The patient is a 49 year old female who presents with a complaint of preop visit. Port-A-Cath removal scheduled.. This very pleasant 49 year old woman returns to see me for a preop visit. We are planning to remove her Port-A-Cath at [facility] long under sedation on November 4.  The patient has a history of thyroid cancer. She then later was diagnosed with diffuse large B-cell lymphoma in November 2015. I placed a Port-A-Cath in November 2015. I was unable to access the right subclavian vein so the port was placed through the right internal jugular vein. The port has functioned well. She received chemotherapy and radiation therapy and is felt to be disease free at this time. She was referred back for port removal.  Discussed the indications, details, techniques, and numerous risk for port removal. Low probability of complications. She is aware of the risk of bleeding, infection, deeper dissection if removal was difficult, air embolus, catheter embolization, and other unforeseen problems. She understands all these issues and all of her questions were answered. She agrees with this plan.   Allergies  Tape 1"X5yd *MEDICAL DEVICES* MetFORMIN HCl ER (MOD) *ANTIDIABETICS* Levaquin *FLUOROQUINOLONES* Penicillin G Potassium *PENICILLINS*  Medication History  Aspirin Childrens (81MG  Tablet Chewable, Oral daily) Active. Biotin (10MG  Tablet, Oral daily) Active. Vitamin B-12 (1000MCG Tablet, Oral daily) Active. Multiple Vitamins (Oral daily) Active. Levothyroxine Sodium (88MCG Capsule, Oral daily) Active. Medications Reconciled  Vitals   Weight: 138 lb Height: 64in Body Surface Area: 1.67 m Body Mass Index: 23.69 kg/m  Temp.: 98.79F  Pulse: 56 (Regular)  BP: 138/80  (Sitting, Left Arm, Standard)     Physical Exam  General Note: Pleasant woman. Very cooperative. No distress. BMI 23.6   Head and Neck Note: Thyroidectomy scar well-healed. Catheter comes up over the right clavicle and enters the right internal jugular vein and this is palpable. The tissues are soft. There is no adenopathy. Left neck scar well-healed.   Chest and Lung Exam Note: Clear to auscultation bilaterally   Cardiovascular Note: Regular rate and rhythm. No murmur. No ectopy   Neurologic Note: Alert and oriented 4. No gross motor or sensory deficits. Gait normal.    Assessment & Plan  DIFFUSE LARGE B-CELL LYMPHOMA, LYMPH NODES OF HEAD, FACE, AND NECK (C83.31) Current Plans You are being scheduled for surgery - you are already scheduled for surgery for removal of the Port-A-Cath on November 4 at [place] long. I think all the paperwork has been done. We have discussed the techniques and numerous risks of this surgery in detail. Someone will need to drive you home from the hospital that day.   HISTORY OF THYROID CANCER (Z85.850)   Edsel Petrin. Dalbert Batman, M.D., [facility] General and Minimally invasive Surgery Breast and Colorectal Surgery Office:   [phone] Pager:   [phone]

## 2015-10-14 NOTE — Patient Instructions (Addendum)
Maria Simmons  10/14/2015   Your procedure is scheduled on: 10/18/2015    Report to [facility] Main  Entrance take [place]  elevators to 3rd floor to  [place] at    0730 AM.  Call this number if you have problems the morning of surgery [phone]   Remember: ONLY 1 PERSON MAY GO WITH YOU TO SHORT STAY TO GET  READY MORNING OF [place].  Do not eat food or drink liquids :After Midnight.     Take these medicines the morning of surgery with A SIP OF WATER: synthroid                                 You may not have any metal on your body including hair pins and              piercings  Do not wear jewelry, make-up, lotions, powders or perfumes, deodorant             Do not wear nail polish.  Do not shave  48 hours prior to surgery.                Do not bring valuables to the hospital. [place].  Contacts, dentures or bridgework may not be worn into surgery.       Patients discharged the day of surgery will not be allowed to drive home.  Name and phone number of your driver:  Special Instructions: N/A              Please read over the following fact sheets you were given: _____________________________________________________________________             [facility] - Preparing for Surgery Before surgery, you can play an important role.  Because skin is not sterile, your skin needs to be as free of germs as possible.  You can reduce the number of germs on your skin by washing with CHG (chlorahexidine gluconate) soap before surgery.  CHG is an antiseptic cleaner which kills germs and bonds with the skin to continue killing germs even after washing. Please DO NOT use if you have an allergy to CHG or antibacterial soaps.  If your skin becomes reddened/irritated stop using the CHG and inform your nurse when you arrive at Short Stay. Do not shave (including legs and underarms) for at least 48 hours  prior to the first CHG shower.  You may shave your face/neck. Please follow these instructions carefully:  1.  Shower with CHG Soap the night before surgery and the  morning of Surgery.  2.  If you choose to wash your hair, wash your hair first as usual with your  normal  shampoo.  3.  After you shampoo, rinse your hair and body thoroughly to remove the  shampoo.                           4.  Use CHG as you would any other liquid soap.  You can apply chg directly  to the skin and wash                       Gently with  a scrungie or clean washcloth.  5.  Apply the CHG Soap to your body ONLY FROM THE NECK DOWN.   Do not use on face/ open                           Wound or open sores. Avoid contact with eyes, ears mouth and genitals (private parts).                       Wash face,  Genitals (private parts) with your normal soap.             6.  Wash thoroughly, paying special attention to the area where your surgery  will be performed.  7.  Thoroughly rinse your body with warm water from the neck down.  8.  DO NOT shower/wash with your normal soap after using and rinsing off  the CHG Soap.                9.  Pat yourself dry with a clean towel.            10.  Wear clean pajamas.            11.  Place clean sheets on your bed the night of your first shower and do not  sleep with pets. Day of Surgery : Do not apply any lotions/deodorants the morning of surgery.  Please wear clean clothes to the hospital/surgery center.  FAILURE TO FOLLOW THESE INSTRUCTIONS MAY RESULT IN THE CANCELLATION OF YOUR SURGERY PATIENT SIGNATURE_________________________________  NURSE SIGNATURE__________________________________  ________________________________________________________________________

## 2015-10-16 ENCOUNTER — Encounter (HOSPITAL_COMMUNITY): Payer: Self-pay

## 2015-10-16 ENCOUNTER — Telehealth: Payer: Self-pay | Admitting: *Deleted

## 2015-10-16 ENCOUNTER — Encounter (HOSPITAL_COMMUNITY)
Admission: RE | Admit: 2015-10-16 | Discharge: 2015-10-16 | Disposition: A | Discharge status: Home / Self Care | Payer: 59 | Source: Ambulatory Visit | Attending: General Surgery | Admitting: General Surgery

## 2015-10-16 DIAGNOSIS — Z452 Encounter for adjustment and management of vascular access device: Secondary | ICD-10-CM | POA: Diagnosis present

## 2015-10-16 DIAGNOSIS — M1990 Unspecified osteoarthritis, unspecified site: Secondary | ICD-10-CM | POA: Diagnosis not present

## 2015-10-16 DIAGNOSIS — Z79899 Other long term (current) drug therapy: Secondary | ICD-10-CM | POA: Diagnosis not present

## 2015-10-16 DIAGNOSIS — C8331 Diffuse large B-cell lymphoma, lymph nodes of head, face, and neck: Secondary | ICD-10-CM | POA: Diagnosis not present

## 2015-10-16 DIAGNOSIS — Z85850 Personal history of malignant neoplasm of thyroid: Secondary | ICD-10-CM | POA: Diagnosis not present

## 2015-10-16 DIAGNOSIS — Z7982 Long term (current) use of aspirin: Secondary | ICD-10-CM | POA: Diagnosis not present

## 2015-10-16 DIAGNOSIS — E039 Hypothyroidism, unspecified: Secondary | ICD-10-CM | POA: Diagnosis not present

## 2015-10-16 DIAGNOSIS — Z923 Personal history of irradiation: Secondary | ICD-10-CM | POA: Diagnosis not present

## 2015-10-16 DIAGNOSIS — Z9221 Personal history of antineoplastic chemotherapy: Secondary | ICD-10-CM | POA: Diagnosis not present

## 2015-10-16 HISTORY — DX: Primary osteoarthritis, right shoulder: M19.011

## 2015-10-16 LAB — CBC
HEMATOCRIT: 37.5 % (ref 36.0–46.0)
HEMOGLOBIN: 12.5 g/dL (ref 12.0–15.0)
MCH: 29.1 pg (ref 26.0–34.0)
MCHC: 33.3 g/dL (ref 30.0–36.0)
MCV: 87.4 fL (ref 78.0–100.0)
Platelets: 304 10*3/uL (ref 150–400)
RBC: 4.29 MIL/uL (ref 3.87–5.11)
RDW: 14.2 % (ref 11.5–15.5)
WBC: 1.7 10*3/uL — ABNORMAL LOW (ref 4.0–10.5)

## 2015-10-16 NOTE — Progress Notes (Signed)
CBC done 10/16/15 sent via EPIC to Dr Dalbert Batman..  Called office and spoke with Sunday Spillers at office .  She will have Dr Dalbert Batman paged to my phone  Regarding CBC results.

## 2015-10-16 NOTE — Telephone Encounter (Signed)
VM message received from pre-surgical testing with abnormal CBC  Results of ANC 1.7. Pt is to have port removed on 10/18/15.  Surgeon plans to do it still but wanted Dr. Alvy Bimler made aware of low ANC.

## 2015-10-16 NOTE — Progress Notes (Signed)
See note in EPIC where Dr Heath Lark made aware of abnormal white count.

## 2015-10-16 NOTE — Progress Notes (Signed)
11/02/14-EKG-EPIC  10/24/14- [place]  04/2015- CT chest in EPIC

## 2015-10-16 NOTE — Progress Notes (Signed)
Dr Dalbert Batman returned call and made aware of white count of 1/7 on 10/16/2015 on CBC results.  Dr Dalbert Batman asked that I make Dr Alvy Bimler aware of white count of 1.7 done 10/16/2015. Dr Dalbert Batman stated that he would proceed with port removal on 10/18/2015 since a low risk procedure.    I called [place] and left voice mail message for triage nurse of Dr Alvy Bimler.  CBC results done 10/16/2015 faxed via EPIC to Dr Alvy Bimler.

## 2015-10-16 NOTE — Telephone Encounter (Signed)
OK 

## 2015-10-18 ENCOUNTER — Ambulatory Visit (HOSPITAL_COMMUNITY): Payer: 59 | Admitting: Anesthesiology

## 2015-10-18 ENCOUNTER — Encounter (HOSPITAL_COMMUNITY): Payer: Self-pay | Admitting: *Deleted

## 2015-10-18 ENCOUNTER — Ambulatory Visit (HOSPITAL_COMMUNITY)
Admission: RE | Admit: 2015-10-18 | Discharge: 2015-10-18 | Disposition: A | Discharge status: Home / Self Care | Payer: 59 | Source: Ambulatory Visit | Attending: General Surgery | Admitting: General Surgery

## 2015-10-18 ENCOUNTER — Encounter (HOSPITAL_COMMUNITY)
Admission: RE | Disposition: A | Discharge status: Home / Self Care | Payer: Self-pay | Source: Ambulatory Visit | Attending: General Surgery

## 2015-10-18 DIAGNOSIS — Z85850 Personal history of malignant neoplasm of thyroid: Secondary | ICD-10-CM | POA: Insufficient documentation

## 2015-10-18 DIAGNOSIS — Z7982 Long term (current) use of aspirin: Secondary | ICD-10-CM | POA: Insufficient documentation

## 2015-10-18 DIAGNOSIS — Z452 Encounter for adjustment and management of vascular access device: Secondary | ICD-10-CM | POA: Diagnosis not present

## 2015-10-18 DIAGNOSIS — Z8579 Personal history of other malignant neoplasms of lymphoid, hematopoietic and related tissues: Secondary | ICD-10-CM | POA: Diagnosis present

## 2015-10-18 DIAGNOSIS — Z923 Personal history of irradiation: Secondary | ICD-10-CM | POA: Insufficient documentation

## 2015-10-18 DIAGNOSIS — Z9221 Personal history of antineoplastic chemotherapy: Secondary | ICD-10-CM | POA: Insufficient documentation

## 2015-10-18 DIAGNOSIS — C8331 Diffuse large B-cell lymphoma, lymph nodes of head, face, and neck: Secondary | ICD-10-CM | POA: Insufficient documentation

## 2015-10-18 DIAGNOSIS — Z79899 Other long term (current) drug therapy: Secondary | ICD-10-CM | POA: Insufficient documentation

## 2015-10-18 DIAGNOSIS — M1990 Unspecified osteoarthritis, unspecified site: Secondary | ICD-10-CM | POA: Insufficient documentation

## 2015-10-18 DIAGNOSIS — C8330 Diffuse large B-cell lymphoma, unspecified site: Secondary | ICD-10-CM

## 2015-10-18 DIAGNOSIS — E039 Hypothyroidism, unspecified: Secondary | ICD-10-CM | POA: Insufficient documentation

## 2015-10-18 HISTORY — PX: PORT-A-CATH REMOVAL: SHX5289

## 2015-10-18 SURGERY — REMOVAL PORT-A-CATH
Anesthesia: General

## 2015-10-18 MED ORDER — HYDROCODONE-ACETAMINOPHEN 5-325 MG PO TABS: 1.0000 | ORAL_TABLET | Freq: Four times a day (QID) | ORAL | Status: DC | PRN

## 2015-10-18 MED ORDER — FENTANYL CITRATE (PF) 100 MCG/2ML IJ SOLN: 25.0000 ug | INTRAMUSCULAR | Status: DC | PRN

## 2015-10-18 MED ORDER — CEFAZOLIN SODIUM-DEXTROSE 2-3 GM-% IV SOLR
2.0000 g | INTRAVENOUS | Status: AC
  Administered 2015-10-18: 2 g via INTRAVENOUS

## 2015-10-18 MED ORDER — 0.9 % SODIUM CHLORIDE (POUR BTL) OPTIME
TOPICAL | Status: DC | PRN
  Administered 2015-10-18: 1000 mL

## 2015-10-18 MED ORDER — BUPIVACAINE-EPINEPHRINE (PF) 0.5% -1:200000 IJ SOLN
INTRAMUSCULAR | Status: AC
  Filled 2015-10-18: qty 30

## 2015-10-18 MED ORDER — LIDOCAINE HCL (CARDIAC) 20 MG/ML IV SOLN
INTRAVENOUS | Status: AC
  Filled 2015-10-18: qty 5

## 2015-10-18 MED ORDER — CHLORHEXIDINE GLUCONATE 4 % EX LIQD: 1.0000 "application " | Freq: Once | CUTANEOUS | Status: DC

## 2015-10-18 MED ORDER — ACETAMINOPHEN 650 MG RE SUPP
650.0000 mg | RECTAL | Status: DC | PRN
  Filled 2015-10-18: qty 1

## 2015-10-18 MED ORDER — FENTANYL CITRATE (PF) 100 MCG/2ML IJ SOLN
INTRAMUSCULAR | Status: AC
  Filled 2015-10-18: qty 4

## 2015-10-18 MED ORDER — SODIUM BICARBONATE 4 % IV SOLN
INTRAVENOUS | Status: AC
  Filled 2015-10-18: qty 5

## 2015-10-18 MED ORDER — FENTANYL CITRATE (PF) 100 MCG/2ML IJ SOLN
INTRAMUSCULAR | Status: DC | PRN
  Administered 2015-10-18: 50 ug via INTRAVENOUS

## 2015-10-18 MED ORDER — PROPOFOL 10 MG/ML IV BOLUS
INTRAVENOUS | Status: AC
  Filled 2015-10-18: qty 20

## 2015-10-18 MED ORDER — SODIUM CHLORIDE 0.9 % IJ SOLN: 3.0000 mL | Freq: Two times a day (BID) | INTRAMUSCULAR | Status: DC

## 2015-10-18 MED ORDER — SODIUM BICARBONATE 4 % IV SOLN
INTRAVENOUS | Status: DC | PRN
  Administered 2015-10-18: 5 mL via INTRAVENOUS

## 2015-10-18 MED ORDER — SODIUM CHLORIDE 0.9 % IV SOLN: INTRAVENOUS | Status: DC

## 2015-10-18 MED ORDER — MIDAZOLAM HCL 5 MG/5ML IJ SOLN
INTRAMUSCULAR | Status: DC | PRN
  Administered 2015-10-18: 2 mg via INTRAVENOUS

## 2015-10-18 MED ORDER — LIDOCAINE-EPINEPHRINE (PF) 1 %-1:200000 IJ SOLN
INTRAMUSCULAR | Status: DC | PRN
  Administered 2015-10-18: 30 mL

## 2015-10-18 MED ORDER — CEFAZOLIN SODIUM-DEXTROSE 2-3 GM-% IV SOLR
INTRAVENOUS | Status: AC
  Filled 2015-10-18: qty 50

## 2015-10-18 MED ORDER — PROPOFOL 10 MG/ML IV BOLUS
INTRAVENOUS | Status: DC | PRN
Start: 2015-10-18 — End: 2015-10-18
  Administered 2015-10-18: 20 mg via INTRAVENOUS

## 2015-10-18 MED ORDER — ONDANSETRON HCL 4 MG/2ML IJ SOLN
INTRAMUSCULAR | Status: AC
  Filled 2015-10-18: qty 2

## 2015-10-18 MED ORDER — OXYCODONE HCL 5 MG PO TABS
5.0000 mg | ORAL_TABLET | ORAL | Status: DC | PRN
Start: 2015-10-18 — End: 2015-10-18

## 2015-10-18 MED ORDER — HYDROMORPHONE HCL 1 MG/ML IJ SOLN: 0.2500 mg | INTRAMUSCULAR | Status: DC | PRN

## 2015-10-18 MED ORDER — ONDANSETRON HCL 4 MG/2ML IJ SOLN
INTRAMUSCULAR | Status: DC | PRN
  Administered 2015-10-18: 4 mg via INTRAVENOUS

## 2015-10-18 MED ORDER — SODIUM CHLORIDE 0.9 % IJ SOLN: 3.0000 mL | INTRAMUSCULAR | Status: DC | PRN

## 2015-10-18 MED ORDER — MIDAZOLAM HCL 2 MG/2ML IJ SOLN
INTRAMUSCULAR | Status: AC
  Filled 2015-10-18: qty 4

## 2015-10-18 MED ORDER — LIDOCAINE-EPINEPHRINE (PF) 1 %-1:200000 IJ SOLN
INTRAMUSCULAR | Status: AC
  Filled 2015-10-18: qty 30

## 2015-10-18 MED ORDER — SODIUM CHLORIDE 0.9 % IV SOLN: 250.0000 mL | INTRAVENOUS | Status: DC | PRN

## 2015-10-18 MED ORDER — PROPOFOL 500 MG/50ML IV EMUL
INTRAVENOUS | Status: DC | PRN
Start: 2015-10-18 — End: 2015-10-18
  Administered 2015-10-18: 140 ug/kg/min via INTRAVENOUS

## 2015-10-18 MED ORDER — ACETAMINOPHEN 325 MG PO TABS: 650.0000 mg | ORAL_TABLET | ORAL | Status: DC | PRN

## 2015-10-18 MED ORDER — PROMETHAZINE HCL 25 MG/ML IJ SOLN: 6.2500 mg | INTRAMUSCULAR | Status: DC | PRN

## 2015-10-18 MED ORDER — LACTATED RINGERS IV SOLN
INTRAVENOUS | Status: DC
  Administered 2015-10-18: 1000 mL via INTRAVENOUS

## 2015-10-18 SURGICAL SUPPLY — 28 items
ADH SKN CLS APL DERMABOND .7 (GAUZE/BANDAGES/DRESSINGS) ×1
APL SKNCLS STERI-STRIP NONHPOA (GAUZE/BANDAGES/DRESSINGS)
BENZOIN TINCTURE PRP APPL 2/3 (GAUZE/BANDAGES/DRESSINGS) IMPLANT
CHLORAPREP W/TINT 26ML (MISCELLANEOUS) ×2 IMPLANT
COVER SURGICAL LIGHT HANDLE (MISCELLANEOUS) ×2 IMPLANT
DECANTER SPIKE VIAL GLASS SM (MISCELLANEOUS) ×2 IMPLANT
DERMABOND ADVANCED (GAUZE/BANDAGES/DRESSINGS) ×1
DERMABOND ADVANCED .7 DNX12 (GAUZE/BANDAGES/DRESSINGS) IMPLANT
DRAPE LAPAROTOMY TRNSV 102X78 (DRAPE) ×2 IMPLANT
ELECT PENCIL ROCKER SW 15FT (MISCELLANEOUS) ×1 IMPLANT
ELECT REM PT RETURN 9FT ADLT (ELECTROSURGICAL) ×2
ELECTRODE REM PT RTRN 9FT ADLT (ELECTROSURGICAL) ×1 IMPLANT
GAUZE SPONGE 4X4 16PLY XRAY LF (GAUZE/BANDAGES/DRESSINGS) ×1 IMPLANT
GLOVE BIOGEL PI IND STRL 7.0 (GLOVE) ×1 IMPLANT
GLOVE BIOGEL PI INDICATOR 7.0 (GLOVE) ×1
GLOVE EUDERMIC 7 POWDERFREE (GLOVE) ×2 IMPLANT
GOWN STRL REUS W/TWL LRG LVL3 (GOWN DISPOSABLE) ×2 IMPLANT
GOWN STRL REUS W/TWL XL LVL3 (GOWN DISPOSABLE) ×3 IMPLANT
KIT BASIN OR (CUSTOM PROCEDURE TRAY) ×2 IMPLANT
NDL HYPO 25X1 1.5 SAFETY (NEEDLE) ×1 IMPLANT
NEEDLE HYPO 25X1 1.5 SAFETY (NEEDLE) ×2 IMPLANT
PACK BASIC VI WITH GOWN DISP (CUSTOM PROCEDURE TRAY) ×2 IMPLANT
STRIP CLOSURE SKIN 1/2X4 (GAUZE/BANDAGES/DRESSINGS) IMPLANT
SUT MNCRL AB 4-0 PS2 18 (SUTURE) ×2 IMPLANT
SUT VIC AB 3-0 SH 27 (SUTURE) ×2
SUT VIC AB 3-0 SH 27XBRD (SUTURE) ×1 IMPLANT
SYR CONTROL 10ML LL (SYRINGE) ×2 IMPLANT
TOWEL OR 17X26 10 PK STRL BLUE (TOWEL DISPOSABLE) ×2 IMPLANT

## 2015-10-18 NOTE — Transfer of Care (Signed)
Immediate Anesthesia Transfer of Care Note  Patient: Maria Simmons  Procedure(s) Performed: Procedure(s): REMOVAL PORT-A-CATH (N/A)  Patient Location: PACU  Anesthesia Type:MAC  Level of Consciousness: awake, sedated, patient cooperative and responds to stimulation  Airway & Oxygen Therapy: Patient Spontanous Breathing and Patient connected to face mask oxygen  Post-op Assessment: Report given to RN and Post -op Vital signs reviewed and stable  Post vital signs: Reviewed and stable  Last Vitals:  Filed Vitals:   10/18/15 0737  BP: 122/81  Pulse: 64  Temp: 36.6 C  Resp: 18    Complications: No apparent anesthesia complications

## 2015-10-18 NOTE — Discharge Instructions (Signed)
Keep wound clean and dry for 36 hours You may take a quick shower starting on Sunday  Ice pack for 10 minutes at a time, 2 or 3 times an hour for 24 hours  Stay in the house today.  You may walk around the block starting tomorrow  You may drive a car in a couple of days if you are very comfortable and do not need to take the narcotic pain medicine  Call Dr. Dalbert Batman if there are any problems with wound healing. Otherwise you do not need to follow-up with Dr. Dalbert Batman.

## 2015-10-18 NOTE — Op Note (Signed)
  Patient Name:           Maria Simmons   Date of Surgery:        10/18/2015  Pre op Diagnosis:      Diffuse large B-cell lymphoma  Post op Diagnosis:    Same  Procedure:                 Removal of Port-A-Cath  Surgeon:                     Edsel Petrin. Dalbert Batman, M.D., FACS  Assistant:                      OR staff  Operative Indications:   The patient is a 49 year old female who presents requesting Port-A-Cath removal.. This very pleasant 49 year old woman returns to see me for a preop visit. We are planning to remove her Port-A-Cath at [facility] long under sedation on November 4.     The patient has a history of thyroid cancer. She then later was diagnosed with diffuse large B-cell lymphoma in November 2015. I placed a Port-A-Cath in November 2015. I was unable to access the right subclavian vein so the port was placed through the right internal jugular vein. The port has functioned well. She received chemotherapy and radiation therapy and is felt to be disease free at this time. She was referred back for port removal.     Discussed the indications, details, techniques, and numerous risk for port removal. Low probability of complications. She is aware of the risk of bleeding, infection, deeper dissection if removal was difficult, air embolus, catheter embolization, and other unforeseen problems. She understands all these issues and all of her questions were answered. She agrees with this plan.  Operative Findings:       The port and the catheter were removed intact.  There was no sign of infection or bleeding.  There is no sign of catheter breakage.  Procedure in Detail:          The patient was brought to the operating room, placed supine,  and was monitored and sedated by the anesthesia department.  The right neck and right chest were prepped and draped in a sterile fashion.  Surgical timeout was performed.  Antibiotics were given.  1% Xylocaine with epinephrine was used as a local  infiltration anesthetic.  A transverse incision was made in the right infraclavicular area, through the old scar overlying the port.  Dissection was carried down to the port.  The capsule was incised.  The port was elevated and all of the Prolene sutures were cut and removed.  The port and catheter were then easily  removed and inspected and found to be intact.  Subcutaneous tissue was closed with 3-0 Vicryl sutures and the skin closed with a running subcuticular 4-0 Monocryl and Dermabond.  The patient tolerated the procedure well was taken to PACU in stable condition.  EBL 10 mL.  Counts correct.  Complications none.     Edsel Petrin. Dalbert Batman, M.D., FACS General and Minimally Invasive Surgery Breast and Colorectal Surgery  10/18/2015 9:42 AM

## 2015-10-18 NOTE — Anesthesia Preprocedure Evaluation (Addendum)
Anesthesia Evaluation  Patient identified by MRN, date of birth, ID band Patient awake    Reviewed: Allergy & Precautions, NPO status , Patient's Chart, lab work & pertinent test results  History of Anesthesia Complications (+) PONV  Airway Mallampati: II  TM Distance: >3 FB Neck ROM: Full    Dental   Pulmonary neg pulmonary ROS,    breath sounds clear to auscultation       Cardiovascular negative cardio ROS   Rhythm:Regular Rate:Normal     Neuro/Psych    GI/Hepatic negative GI ROS, Neg liver ROS,   Endo/Other  Hypothyroidism   Renal/GU negative Renal ROS     Musculoskeletal  (+) Arthritis ,   Abdominal   Peds  Hematology   Anesthesia Other Findings   Reproductive/Obstetrics                            Anesthesia Physical Anesthesia Plan  ASA: II  Anesthesia Plan: General   Post-op Pain Management:    Induction: Intravenous  Airway Management Planned: LMA  Additional Equipment:   Intra-op Plan:   Post-operative Plan: Extubation in OR  Informed Consent: I have reviewed the patients History and Physical, chart, labs and discussed the procedure including the risks, benefits and alternatives for the proposed anesthesia with the patient or authorized representative who has indicated his/her understanding and acceptance.   Dental advisory given  Plan Discussed with: CRNA and Anesthesiologist  Anesthesia Plan Comments:        Anesthesia Quick Evaluation

## 2015-10-18 NOTE — Anesthesia Postprocedure Evaluation (Signed)
  Anesthesia Post-op Note  Patient: Maria Simmons  Procedure(s) Performed: Procedure(s) (LRB): REMOVAL PORT-A-CATH (N/A)  Patient Location: PACU  Anesthesia Type: MAC  Level of Consciousness: awake and alert   Airway and Oxygen Therapy: Patient Spontanous Breathing  Post-op Pain: mild  Post-op Assessment: Post-op Vital signs reviewed, Patient's Cardiovascular Status Stable, Respiratory Function Stable, Patent Airway and No signs of Nausea or vomiting  Last Vitals:  Filed Vitals:   10/18/15 1105  BP: 129/71  Pulse: 62  Temp:   Resp: 16    Post-op Vital Signs: stable   Complications: No apparent anesthesia complications

## 2015-10-18 NOTE — Interval H&P Note (Signed)
History and Physical Interval Note:  10/18/2015 9:02 AM  Maria Simmons  has presented today for surgery, with the diagnosis of lymphoma  The various methods of treatment have been discussed with the patient and family. After consideration of risks, benefits and other options for treatment, the patient has consented to  Procedure(s): REMOVAL PORT-A-CATH (N/A) as a surgical intervention .  The patient's history has been reviewed, patient examined, no change in status, stable for surgery.  I have reviewed the patient's chart and labs.  Questions were answered to the patient's satisfaction.     Adin Hector

## 2015-11-11 DIAGNOSIS — M79673 Pain in unspecified foot: Secondary | ICD-10-CM

## 2015-11-18 ENCOUNTER — Ambulatory Visit (HOSPITAL_BASED_OUTPATIENT_CLINIC_OR_DEPARTMENT_OTHER): Payer: 59 | Admitting: Hematology and Oncology

## 2015-11-18 ENCOUNTER — Encounter: Payer: Self-pay | Admitting: Hematology and Oncology

## 2015-11-18 ENCOUNTER — Other Ambulatory Visit (HOSPITAL_BASED_OUTPATIENT_CLINIC_OR_DEPARTMENT_OTHER): Payer: 59

## 2015-11-18 VITALS — BP 121/75 | HR 88 | Temp 98.3°F | Resp 18 | Ht 63.0 in | Wt 135.3 lb

## 2015-11-18 DIAGNOSIS — Z8572 Personal history of non-Hodgkin lymphomas: Secondary | ICD-10-CM | POA: Insufficient documentation

## 2015-11-18 DIAGNOSIS — C8330 Diffuse large B-cell lymphoma, unspecified site: Secondary | ICD-10-CM

## 2015-11-18 DIAGNOSIS — C8331 Diffuse large B-cell lymphoma, lymph nodes of head, face, and neck: Secondary | ICD-10-CM

## 2015-11-18 DIAGNOSIS — Z8579 Personal history of other malignant neoplasms of lymphoid, hematopoietic and related tissues: Secondary | ICD-10-CM

## 2015-11-18 DIAGNOSIS — Z85850 Personal history of malignant neoplasm of thyroid: Secondary | ICD-10-CM | POA: Diagnosis not present

## 2015-11-18 DIAGNOSIS — D72819 Decreased white blood cell count, unspecified: Secondary | ICD-10-CM

## 2015-11-18 HISTORY — DX: Personal history of non-Hodgkin lymphomas: Z85.72

## 2015-11-18 LAB — COMPREHENSIVE METABOLIC PANEL
ALBUMIN: 3.9 g/dL (ref 3.5–5.0)
ALK PHOS: 73 U/L (ref 40–150)
ALT: 12 U/L (ref 0–55)
ANION GAP: 8 meq/L (ref 3–11)
AST: 20 U/L (ref 5–34)
BUN: 15.8 mg/dL (ref 7.0–26.0)
CALCIUM: 9.3 mg/dL (ref 8.4–10.4)
CO2: 29 mEq/L (ref 22–29)
Chloride: 104 mEq/L (ref 98–109)
Creatinine: 0.9 mg/dL (ref 0.6–1.1)
EGFR: 88 mL/min/{1.73_m2} — AB (ref >90)
Glucose: 103 mg/dl (ref 70–140)
POTASSIUM: 4.5 meq/L (ref 3.5–5.1)
Sodium: 140 mEq/L (ref 136–145)
Total Bilirubin: 0.48 mg/dL (ref 0.20–1.20)
Total Protein: 7.1 g/dL (ref 6.4–8.3)

## 2015-11-18 LAB — CBC WITH DIFFERENTIAL/PLATELET
BASO%: 1.1 % (ref 0.0–2.0)
BASOS ABS: 0 10*3/uL (ref 0.0–0.1)
EOS%: 2.9 % (ref 0.0–7.0)
Eosinophils Absolute: 0.1 10*3/uL (ref 0.0–0.5)
HEMATOCRIT: 38.9 % (ref 34.8–46.6)
HGB: 12.6 g/dL (ref 11.6–15.9)
LYMPH#: 0.5 10*3/uL — AB (ref 0.9–3.3)
LYMPH%: 25.3 % (ref 14.0–49.7)
MCH: 28.5 pg (ref 25.1–34.0)
MCHC: 32.4 g/dL (ref 31.5–36.0)
MCV: 87.9 fL (ref 79.5–101.0)
MONO#: 0.3 10*3/uL (ref 0.1–0.9)
MONO%: 13.1 % (ref 0.0–14.0)
NEUT#: 1.2 10*3/uL — ABNORMAL LOW (ref 1.5–6.5)
NEUT%: 57.6 % (ref 38.4–76.8)
PLATELETS: 311 10*3/uL (ref 145–400)
RBC: 4.43 10*6/uL (ref 3.70–5.45)
RDW: 15.4 % — ABNORMAL HIGH (ref 11.2–14.5)
WBC: 2.1 10*3/uL — ABNORMAL LOW (ref 3.9–10.3)

## 2015-11-18 LAB — LACTATE DEHYDROGENASE: LDH: 154 U/L (ref 125–245)

## 2015-11-19 NOTE — Assessment & Plan Note (Signed)
She has complete response to treatment.  I will see her back in 6 months or repeat history, physical examination and blood work. There is no role for routine imaging study in the future.

## 2015-11-19 NOTE — Progress Notes (Signed)
[facility] OFFICE PROGRESS NOTE  Patient Care Team: Jacelyn Pi, MD as PCP - General (Endocrinology) Megan Salon, MD as Consulting Physician (Gynecology) Heath Lark, MD as Consulting Physician (Hematology and Oncology) Leota Sauers, RN as Oncology Nurse Navigator Eppie Gibson, MD as Attending Physician (Radiation Oncology) Frederik Pear, MD as Consulting Physician (Orthopedic Surgery) Fanny Skates, MD as Consulting Physician (General Surgery)  SUMMARY OF ONCOLOGIC HISTORY: Oncology History   DLBCL (diffuse large B cell lymphoma)   Staging form: Lymphoid Neoplasms, AJCC 6th Edition     Clinical stage from 11/15/2014: Stage II - Signed by Heath Lark, MD on 11/15/2014       History of lymphoma   09/14/2014 Imaging PET/CT scan showed Hypermetabolic bilateral cervical lymphadenopathy, left side greater than right, and sub-cm hypermetabolic left subpectoral lymph node. Hypermetabolic activity throughout the lingular and palatine tonsils in Waldeyer's ring   10/12/2014 Procedure Biopsy of the left neck lymph node came back positive for diffuse large B-cell lymphoma.Accession: [id number]   10/24/2014 Procedure PAC placed.   10/24/2014 Bone Marrow Biopsy Bone marrow biopsy was negative.Cytogenetics were normal   10/24/2014 Imaging Echocardiogram showed normal ejection fraction   10/25/2014 - 12/06/2014 Chemotherapy She received 3 cyles of R CHOP chemotherapy. Further doses of R CHOP chemotherapy was reduced due to recent neutropenic fever with cycle 1 of treatment.   11/01/2014 Adverse Reaction She was seen in the emergency Department for possible neutropenic fever. Cultures were negative. She received antibiotic therapy.   11/15/2014 - 12/06/2014 Chemotherapy Intrathecal chemotherapy is added due to risk of CNS relapse   01/04/2015 Imaging PET CT scan show complete remission from lymphadenopathy. Incidental finding of right shoulder arthropathy   01/16/2015 Imaging MRI of the  right shoulder show no evidence of cancer, with evidence of tendon tear and possible changes related to trauma   02/05/2015 - 02/27/2015 Radiation Therapy IMRT to tonsils, neck and L subpectoral nodal bed:  30.6 Gy in 17 fractions.   04/25/2015 Imaging CT neck, chest, abdomen/pelvis:  No findings for active lymphomatous involvement.   07/16/2015 Imaging PET/ CT scans show no evidence of lymphoma. It showed persistent abnormality in the right humeral area   10/18/2015 Procedure PAC removed.    INTERVAL HISTORY: Please see below for problem oriented charting. She returns for further follow-up. Denies new lymphadenopathy. She complained of fatigue and her dose of thyroid medicine was recently changed. She denies recent infection  REVIEW OF SYSTEMS:   Constitutional: Denies fevers, chills or abnormal weight loss Eyes: Denies blurriness of vision Ears, nose, mouth, throat, and face: Denies mucositis or sore throat Respiratory: Denies cough, dyspnea or wheezes Cardiovascular: Denies palpitation, chest discomfort or lower extremity swelling Gastrointestinal:  Denies nausea, heartburn or change in bowel habits Skin: Denies abnormal skin rashes Lymphatics: Denies new lymphadenopathy or easy bruising Neurological:Denies numbness, tingling or new weaknesses Behavioral/Psych: Mood is stable, no new changes  All other systems were reviewed with the patient and are negative.  I have reviewed the past medical history, past surgical history, social history and family history with the patient and they are unchanged from previous note.  ALLERGIES:  is allergic to metformin and related; tape; levaquin; and penicillins.  MEDICATIONS:  Current Outpatient Prescriptions  Medication Sig Dispense Refill  . aspirin EC 81 MG tablet Take 81 mg by mouth every morning.     . Biotin 5000 MCG TABS Take 1 tablet by mouth every morning.    . cyanocobalamin 500 MCG tablet Take 500 mcg  by mouth daily.    [name]  HYDROcodone-acetaminophen (NORCO) 5-325 MG tablet Take 1-2 tablets by mouth every 6 (six) hours as needed. 30 tablet 0  . Multiple Vitamin (MULITIVITAMIN WITH MINERALS) TABS Take 1 tablet by mouth every morning.     [name] SYNTHROID 112 MCG tablet Take 112 mcg by mouth daily.  12  . Vitamin D, Cholecalciferol, 1000 UNITS TABS Take 1,000 Units by mouth daily at 12 noon.     . [DISCONTINUED] calcium-vitamin D (OSCAL WITH D) 500-200 MG-UNIT per tablet Take 1 tablet by mouth daily.     No current facility-administered medications for this visit.    PHYSICAL EXAMINATION: ECOG PERFORMANCE STATUS: 0 - Asymptomatic  Filed Vitals:   11/18/15 1304  BP: 121/75  Pulse: 88  Temp: 98.3 F (36.8 C)  Resp: 18   Filed Weights   11/18/15 1304  Weight: 135 lb 4.8 oz (61.372 kg)    GENERAL:alert, no distress and comfortable SKIN: skin color, texture, turgor are normal, no rashes or significant lesions EYES: normal, Conjunctiva are pink and non-injected, sclera clear OROPHARYNX:no exudate, no erythema and lips, buccal mucosa, and tongue normal  NECK: Mildly thick from prior radiation.  LYMPH:  no palpable lymphadenopathy in the cervical, axillary or inguinal LUNGS: clear to auscultation and percussion with normal breathing effort HEART: regular rate & rhythm and no murmurs and no lower extremity edema ABDOMEN:abdomen soft, non-tender and normal bowel sounds Musculoskeletal:no cyanosis of digits and no clubbing  NEURO: alert & oriented x 3 with fluent speech, no focal motor/sensory deficits  LABORATORY DATA:  I have reviewed the data as listed    Component Value Date/Time   NA 140 11/18/2015 1255   NA 140 11/01/2014 1424   K 4.5 11/18/2015 1255   K 4.2 11/01/2014 1424   CL 99 11/01/2014 1424   CL 102 08/31/2012 1336   CO2 29 11/18/2015 1255   CO2 26 11/01/2014 1424   GLUCOSE 103 11/18/2015 1255   GLUCOSE 101* 11/01/2014 1424   GLUCOSE 95 08/31/2012 1336   BUN 15.8 11/18/2015 1255   BUN 9  11/01/2014 1424   CREATININE 0.9 11/18/2015 1255   CREATININE 0.63 11/01/2014 1424   CALCIUM 9.3 11/18/2015 1255   CALCIUM 9.0 11/01/2014 1424   PROT 7.1 11/18/2015 1255   PROT 6.7 11/01/2014 1424   ALBUMIN 3.9 11/18/2015 1255   ALBUMIN 3.5 11/01/2014 1424   AST 20 11/18/2015 1255   AST 9 11/01/2014 1424   ALT 12 11/18/2015 1255   ALT 9 11/01/2014 1424   ALKPHOS 73 11/18/2015 1255   ALKPHOS 86 11/01/2014 1424   BILITOT 0.48 11/18/2015 1255   BILITOT 0.6 11/01/2014 1424   GFRNONAA >90 11/01/2014 1424   GFRAA >90 11/01/2014 1424    No results found for: SPEP, UPEP  Lab Results  Component Value Date   WBC 2.1* 11/18/2015   NEUTROABS 1.2* 11/18/2015   HGB 12.6 11/18/2015   HCT 38.9 11/18/2015   MCV 87.9 11/18/2015   PLT 311 11/18/2015      Chemistry      Component Value Date/Time   NA 140 11/18/2015 1255   NA 140 11/01/2014 1424   K 4.5 11/18/2015 1255   K 4.2 11/01/2014 1424   CL 99 11/01/2014 1424   CL 102 08/31/2012 1336   CO2 29 11/18/2015 1255   CO2 26 11/01/2014 1424   BUN 15.8 11/18/2015 1255   BUN 9 11/01/2014 1424   CREATININE 0.9 11/18/2015 1255   CREATININE  0.63 11/01/2014 1424      Component Value Date/Time   CALCIUM 9.3 11/18/2015 1255   CALCIUM 9.0 11/01/2014 1424   ALKPHOS 73 11/18/2015 1255   ALKPHOS 86 11/01/2014 1424   AST 20 11/18/2015 1255   AST 9 11/01/2014 1424   ALT 12 11/18/2015 1255   ALT 9 11/01/2014 1424   BILITOT 0.48 11/18/2015 1255   BILITOT 0.6 11/01/2014 1424      ASSESSMENT & PLAN:  History of lymphoma She has complete response to treatment.  I will see her back in 6 months or repeat history, physical examination and blood work. There is no role for routine imaging study in the future.    Chronic leukopenia  The cause is unknown. She is not symptomatic. Recommend observation only.    Hx of papillary thyroid carcinoma She is on chronic suppressive therapy with thyroid medicine. I will defer to his  endocrinologist for further management     Orders Placed This Encounter  Procedures  . CBC with Differential/Platelet    Standing Status: Future     Number of Occurrences:      Standing Expiration Date: 12/22/2016  . Comprehensive metabolic panel    Standing Status: Future     Number of Occurrences:      Standing Expiration Date: 12/22/2016   All questions were answered. The patient knows to call the clinic with any problems, questions or concerns. No barriers to learning was detected. I spent 15 minutes counseling the patient face to face. The total time spent in the appointment was 20 minutes and more than 50% was on counseling and review of test results     [facility], [place], MD 11/19/2015 9:14 AM

## 2015-11-19 NOTE — Assessment & Plan Note (Signed)
She is on chronic suppressive therapy with thyroid medicine. I will defer to his endocrinologist for further management   

## 2015-11-19 NOTE — Assessment & Plan Note (Signed)
The cause is unknown. She is not symptomatic. Recommend observation only. 

## 2015-12-27 ENCOUNTER — Telehealth: Payer: Self-pay | Admitting: *Deleted

## 2015-12-27 NOTE — Telephone Encounter (Signed)
  Oncology Nurse Navigator Documentation Navigator Location: [facility] (12/27/15 [id number]) Navigator Encounter Type: Telephone (12/27/15 0925) Telephone: Incoming Call;Patient Update (12/27/15 0925)                              Acuity: Level 1 (12/27/15 0925)     Ms Lambing called this morning to express concern about recent symptoms:  Extreme fatigue despite good sleep.  Recent onset of occasional night sweats followed by "feel cold".  Itching on outside of R breast, "same spot".  Occasional stomach distress. She indicated she saw her PCP Dr. Chalmers Cater on 12/08/15.  Dr. Chalmers Cater increased synthroid to 125 mcg (previously 112 mcg). I suggested she contact her PCP to describe symptoms as they appear to be related to underactive thyroid.  I noted I would inform Drs. Alvy Bimler and Isidore Moos of her call, notify her if either of them wants to see her. She agreed to this plan.  Gayleen Orem, RN, BSN, [facility] [place] [phone]          Time Spent with Patient: 15 (12/27/15 0925)

## 2016-01-04 DIAGNOSIS — H524 Presbyopia: Secondary | ICD-10-CM | POA: Diagnosis not present

## 2016-02-05 DIAGNOSIS — E7800 Pure hypercholesterolemia, unspecified: Secondary | ICD-10-CM | POA: Diagnosis not present

## 2016-02-14 MED FILL — SYNTHROID 112 MCG TABLET: 112 | 90 days supply | Qty: 90 | Fill #0

## 2016-02-18 ENCOUNTER — Encounter: Payer: Self-pay | Admitting: Nurse Practitioner

## 2016-02-18 ENCOUNTER — Telehealth: Payer: Self-pay | Admitting: Nurse Practitioner

## 2016-02-18 ENCOUNTER — Ambulatory Visit (INDEPENDENT_AMBULATORY_CARE_PROVIDER_SITE_OTHER): Payer: 59 | Admitting: Nurse Practitioner

## 2016-02-18 VITALS — BP 126/82 | HR 86 | Temp 98.3°F | Resp 16 | Ht 63.0 in | Wt 143.0 lb

## 2016-02-18 DIAGNOSIS — N8189 Other female genital prolapse: Secondary | ICD-10-CM

## 2016-02-18 DIAGNOSIS — R300 Dysuria: Secondary | ICD-10-CM

## 2016-02-18 LAB — POCT URINALYSIS DIPSTICK
BILIRUBIN UA: NEGATIVE
Glucose, UA: NEGATIVE
KETONES UA: NEGATIVE
NITRITE UA: NEGATIVE
PH UA: 5
Protein, UA: NEGATIVE
RBC UA: NEGATIVE
Urobilinogen, UA: NEGATIVE

## 2016-02-18 NOTE — Progress Notes (Signed)
Subjective:     Patient ID: Maria Simmons, female   DOB: 1965-12-17, 50 y.o.   MRN: [id number]  HPI  50 yo DAA female G3,P2,A1 presents with a sudden onset of lower pelvic pressure and noted while bathing this am a 'bulge' from the vagina.   States as she was washing and felt something coming out that was firm and round.  She had been noting an increase in urinary pressure and maybe even some incomplete emptying of her bladder.  Denies dysuria, signs of UTI.   She has been struggling with a correction of her hypothyroid and doses of Synthroid have been changing.  Dr. Chalmers Cater is following for this.  She has been quite cold most of the time but no hard shaking chills.  'Feverish' maybe at times. Several other concerns of a rash upper left thigh that is now gone and itching of her mid back.  After discussion she also relays a history that since her father passed she has been living at his home.  She does most of the work around the house and recently was trying to use a Conservation officer, nature that is quite heavy and she really strained to get the mower out of a ditch.  Neighbor helped but she really strained.  Same day she also helped to move a cycle into the garage that she also really strained.  She had a CT of abdomen and pelvis with contrast on 04/25/15 which showed a "small volume of pelvic ascites".  Now with the current symptoms she is concerned that something of an ovarian disorder or cancer could be wrong.  Given her PMH she is worried.  She was having PUS often secondary to OV cyst.  Last one was done 03/26/10 by Dr. Karle Starch on paper chart.  Review of Systems  Constitutional: Positive for fever, chills, fatigue and unexpected weight change.       Weight changes have been due to thyroid regulation.  Fatigue and chills is constant.  HENT:       History of Thyroid cancer treated with surgery 08/17/2006 and RAI 09/16/2006.  On replacement therapy.  Gastrointestinal: Positive for constipation.       Constipation is on  and off.  Endocrine: Positive for cold intolerance.       Due to hypothyroid  Genitourinary: Positive for decreased urine volume.       S/P TVH secondary DUB.  Allergic/Immunologic:       Diagnosis of Diffuse large B cell Lymphoma stage II 10/19/14.  She was treated with chemotherapy and radiation that was completed in summer 2016 to the neck area.       Objective:   Physical Exam  Constitutional: She is oriented to person, place, and time. She appears well-developed and well-nourished. She appears distressed.  Neck: Normal range of motion. No thyromegaly present.  Cardiovascular: Normal rate.   Pulmonary/Chest: Effort normal.  Abdominal: Soft. She exhibits no distension and no mass. There is no tenderness. There is no rebound and no guarding.  Genitourinary:  Patient has a pelvic relaxation with a prolapse of vaginal vault to the introitus.  There are no vulvar lesions, no inguinal nodes.  With insertion of the speculum - no discharge, vaginal atrophy is present.  With removal of speculum there is prolapse and mild cystocele.  On bimanual exam no mass, on rectal exam no mass and only mild rectocele.  Lymphadenopathy:    She has no cervical adenopathy.  Neurological: She is alert and oriented to  person, place, and time.  Psychiatric: She has a normal mood and affect. Her behavior is normal. Judgment and thought content normal.  She is anxious and nervous       Assessment:     1. S/P TVH secondary to DUB 04/2000 2. Currently pelvic relaxation and cystocele - most likely aggravated with recent heavy lifting and pulling 3. History of Ovarian cyst - last PUS 03/26/10 -- CT scan 04/25/15 showing a "small volume of pelvic ascites", concerned and wants to repeat PUS if Dr. Karle Starch feels is needed for evaluation. 4. History of both thyroid cancer 08/2006 and Diffuse large B cell Lymphoma stage II 11/15 5. Current weight loss with cold intolerance -- most likely due to alteration of thyroid  medication 6. Change in urine volume - will R/O UTI as cause    Plan:     Will get Uro PT to see and evaluate for pelvic floor relaxation. Will have Dr. Sabra Heck to review paper chart and most recent CT scans about need to proceed with further studies.

## 2016-02-18 NOTE — Telephone Encounter (Signed)
Note not needed. Patient scheduled for today.

## 2016-02-19 LAB — URINALYSIS, MICROSCOPIC ONLY
Bacteria, UA: NONE SEEN [HPF]
Casts: NONE SEEN [LPF]
Crystals: NONE SEEN [HPF]
RBC / HPF: NONE SEEN RBC/HPF (ref <=2)
Yeast: NONE SEEN [HPF]

## 2016-02-20 LAB — URINE CULTURE: Colony Count: 50000

## 2016-02-20 NOTE — Progress Notes (Signed)
Reviewed personally.  M. Suzanne Isamar Nazir, MD.  

## 2016-02-21 ENCOUNTER — Telehealth: Payer: Self-pay | Admitting: Nurse Practitioner

## 2016-02-21 MED ORDER — CIPROFLOXACIN HCL 500 MG PO TABS: 500.0000 mg | ORAL_TABLET | Freq: Two times a day (BID) | ORAL | Status: DC

## 2016-02-21 MED FILL — CIPROFLOXACIN HCL 500 MG TA: 500 | 3 days supply | Qty: 6 | Fill #0

## 2016-02-21 NOTE — Telephone Encounter (Signed)
Patient was called with the results of the urine culture.  It shows 50,000 colonies of mixed bacteria.  States she is still having urinary pressure but no dysuria.  Still has a  feeling somewhat bladder discomfort.  She will be given 3 days of Cipro - she has allergy to Levofloxacin but states she has taken Cipro well without side effects.  She is to stop med's if any reaction.  She was also given results of the note from Dr. Karle Starch.  She had reviewed the epic chart and paper chart and does not feel that a PUS is needed unless pt wanted one.  Pt is very appreciative of time that Dr. Karle Starch took to review and will not pursue PUS at this time.  She does have apt for pelvic PT on Thursday.

## 2016-02-27 DIAGNOSIS — R278 Other lack of coordination: Secondary | ICD-10-CM | POA: Diagnosis not present

## 2016-02-27 DIAGNOSIS — M6281 Muscle weakness (generalized): Secondary | ICD-10-CM | POA: Diagnosis not present

## 2016-02-27 DIAGNOSIS — N3941 Urge incontinence: Secondary | ICD-10-CM | POA: Diagnosis not present

## 2016-02-27 DIAGNOSIS — N8111 Cystocele, midline: Secondary | ICD-10-CM | POA: Diagnosis not present

## 2016-03-12 DIAGNOSIS — M6281 Muscle weakness (generalized): Secondary | ICD-10-CM | POA: Diagnosis not present

## 2016-03-12 DIAGNOSIS — N3941 Urge incontinence: Secondary | ICD-10-CM | POA: Diagnosis not present

## 2016-03-12 DIAGNOSIS — N8111 Cystocele, midline: Secondary | ICD-10-CM | POA: Diagnosis not present

## 2016-03-12 DIAGNOSIS — R278 Other lack of coordination: Secondary | ICD-10-CM | POA: Diagnosis not present

## 2016-03-19 ENCOUNTER — Telehealth: Payer: Self-pay | Admitting: Hematology and Oncology

## 2016-03-19 ENCOUNTER — Other Ambulatory Visit (HOSPITAL_BASED_OUTPATIENT_CLINIC_OR_DEPARTMENT_OTHER): Payer: 59

## 2016-03-19 ENCOUNTER — Ambulatory Visit (HOSPITAL_BASED_OUTPATIENT_CLINIC_OR_DEPARTMENT_OTHER): Payer: 59 | Admitting: Hematology and Oncology

## 2016-03-19 ENCOUNTER — Encounter: Payer: Self-pay | Admitting: Hematology and Oncology

## 2016-03-19 VITALS — BP 137/71 | HR 79 | Temp 98.3°F | Resp 18 | Wt 150.2 lb

## 2016-03-19 DIAGNOSIS — D72819 Decreased white blood cell count, unspecified: Secondary | ICD-10-CM

## 2016-03-19 DIAGNOSIS — Z8572 Personal history of non-Hodgkin lymphomas: Secondary | ICD-10-CM

## 2016-03-19 DIAGNOSIS — R194 Change in bowel habit: Secondary | ICD-10-CM

## 2016-03-19 DIAGNOSIS — Z8579 Personal history of other malignant neoplasms of lymphoid, hematopoietic and related tissues: Secondary | ICD-10-CM

## 2016-03-19 HISTORY — DX: Change in bowel habit: R19.4

## 2016-03-19 LAB — COMPREHENSIVE METABOLIC PANEL
ALBUMIN: 3.5 g/dL (ref 3.5–5.0)
ALK PHOS: 60 U/L (ref 40–150)
ALT: 9 U/L (ref 0–55)
ANION GAP: 7 meq/L (ref 3–11)
AST: 14 U/L (ref 5–34)
BILIRUBIN TOTAL: 0.52 mg/dL (ref 0.20–1.20)
BUN: 17.4 mg/dL (ref 7.0–26.0)
CALCIUM: 9.1 mg/dL (ref 8.4–10.4)
CO2: 29 mEq/L (ref 22–29)
Chloride: 104 mEq/L (ref 98–109)
Creatinine: 0.9 mg/dL (ref 0.6–1.1)
EGFR: 82 mL/min/{1.73_m2} — AB (ref >90)
Glucose: 103 mg/dl (ref 70–140)
Potassium: 4.3 mEq/L (ref 3.5–5.1)
Sodium: 140 mEq/L (ref 136–145)
TOTAL PROTEIN: 6.8 g/dL (ref 6.4–8.3)

## 2016-03-19 LAB — CBC WITH DIFFERENTIAL/PLATELET
BASO%: 0.6 % (ref 0.0–2.0)
Basophils Absolute: 0 10*3/uL (ref 0.0–0.1)
EOS ABS: 0.1 10*3/uL (ref 0.0–0.5)
EOS%: 2.5 % (ref 0.0–7.0)
HCT: 38.1 % (ref 34.8–46.6)
HEMOGLOBIN: 12.4 g/dL (ref 11.6–15.9)
LYMPH#: 0.6 10*3/uL — AB (ref 0.9–3.3)
LYMPH%: 29.7 % (ref 14.0–49.7)
MCH: 27.8 pg (ref 25.1–34.0)
MCHC: 32.5 g/dL (ref 31.5–36.0)
MCV: 85.4 fL (ref 79.5–101.0)
MONO#: 0.3 10*3/uL (ref 0.1–0.9)
MONO%: 15.6 % — ABNORMAL HIGH (ref 0.0–14.0)
NEUT%: 51.6 % (ref 38.4–76.8)
NEUTROS ABS: 1.1 10*3/uL — AB (ref 1.5–6.5)
PLATELETS: 214 10*3/uL (ref 145–400)
RBC: 4.45 10*6/uL (ref 3.70–5.45)
RDW: 13.8 % (ref 11.2–14.5)
WBC: 2.1 10*3/uL — AB (ref 3.9–10.3)

## 2016-03-19 NOTE — Progress Notes (Signed)
[place] OFFICE PROGRESS NOTE  Patient Care Team: Jacelyn Pi, MD as PCP - General (Endocrinology) Megan Salon, MD as Consulting Physician (Gynecology) Heath Lark, MD as Consulting Physician (Hematology and Oncology) Leota Sauers, RN as Oncology Nurse Navigator Eppie Gibson, MD as Attending Physician (Radiation Oncology) Frederik Pear, MD as Consulting Physician (Orthopedic Surgery) Fanny Skates, MD as Consulting Physician (General Surgery)  SUMMARY OF ONCOLOGIC HISTORY: Oncology History   DLBCL (diffuse large B cell lymphoma)   Staging form: Lymphoid Neoplasms, AJCC 6th Edition     Clinical stage from 11/15/2014: Stage II - Signed by Heath Lark, MD on 11/15/2014       History of lymphoma   09/14/2014 Imaging PET/CT scan showed Hypermetabolic bilateral cervical lymphadenopathy, left side greater than right, and sub-cm hypermetabolic left subpectoral lymph node. Hypermetabolic activity throughout the lingular and palatine tonsils in Waldeyer's ring   10/12/2014 Procedure Biopsy of the left neck lymph node came back positive for diffuse large B-cell lymphoma.Accession: [id number]   10/24/2014 Procedure PAC placed.   10/24/2014 Bone Marrow Biopsy Bone marrow biopsy was negative.Cytogenetics were normal   10/24/2014 Imaging Echocardiogram showed normal ejection fraction   10/25/2014 - 12/06/2014 Chemotherapy She received 3 cyles of R CHOP chemotherapy. Further doses of R CHOP chemotherapy was reduced due to recent neutropenic fever with cycle 1 of treatment.   11/01/2014 Adverse Reaction She was seen in the emergency Department for possible neutropenic fever. Cultures were negative. She received antibiotic therapy.   11/15/2014 - 12/06/2014 Chemotherapy Intrathecal chemotherapy is added due to risk of CNS relapse   01/04/2015 Imaging PET CT scan show complete remission from lymphadenopathy. Incidental finding of right shoulder arthropathy   01/16/2015 Imaging MRI of the  right shoulder show no evidence of cancer, with evidence of tendon tear and possible changes related to trauma   02/05/2015 - 02/27/2015 Radiation Therapy IMRT to tonsils, neck and L subpectoral nodal bed:  30.6 Gy in 17 fractions.   04/25/2015 Imaging CT neck, chest, abdomen/pelvis:  No findings for active lymphomatous involvement.   07/16/2015 Imaging PET/ CT scans show no evidence of lymphoma. It showed persistent abnormality in the right humeral area   10/18/2015 Procedure PAC removed.    INTERVAL HISTORY: Please see below for problem oriented charting. She complained of excessive fatigue, feeling cold, passage of dark stool, pelvic pain and pelvic dysfunction, recent night sweats and skin itching. Denies recent infection. She has been following closely with the endocrinologist who is adjusting her thyroid medications. Denies new lymphadenopathy.  REVIEW OF SYSTEMS:   Constitutional: Denies fevers, chills or abnormal weight loss Eyes: Denies blurriness of vision Ears, nose, mouth, throat, and face: Denies mucositis or sore throat Respiratory: Denies cough, dyspnea or wheezes Cardiovascular: Denies palpitation, chest discomfort or lower extremity swelling Skin: Denies abnormal skin rashes Lymphatics: Denies new lymphadenopathy or easy bruising Neurological:Denies numbness, tingling or new weaknesses Behavioral/Psych: Mood is stable, no new changes  All other systems were reviewed with the patient and are negative.  I have reviewed the past medical history, past surgical history, social history and family history with the patient and they are unchanged from previous note.  ALLERGIES:  is allergic to metformin and related; tape; levaquin; and penicillins.  MEDICATIONS:  Current Outpatient Prescriptions  Medication Sig Dispense Refill  . aspirin EC 81 MG tablet Take 81 mg by mouth every morning.     . Biotin 5000 MCG TABS Take 1 tablet by mouth every morning. Reported on 02/18/2016    .  ciprofloxacin (CIPRO) 500 MG tablet Take 1 tablet (500 mg total) by mouth 2 (two) times daily. Take BID x 3 days.  Take all antibiotics. 6 tablet 0  . cyanocobalamin 500 MCG tablet Take 500 mcg by mouth daily.    . Multiple Vitamin (MULITIVITAMIN WITH MINERALS) TABS Take 1 tablet by mouth every morning.     [name] SYNTHROID 112 MCG tablet Take 112 mcg by mouth daily.  12  . Vitamin D, Cholecalciferol, 1000 UNITS TABS Take 1,000 Units by mouth daily at 12 noon.     . [DISCONTINUED] calcium-vitamin D (OSCAL WITH D) 500-200 MG-UNIT per tablet Take 1 tablet by mouth daily.     No current facility-administered medications for this visit.    PHYSICAL EXAMINATION: ECOG PERFORMANCE STATUS: 1 - Symptomatic but completely ambulatory  Filed Vitals:   03/19/16 0825  BP: 137/71  Pulse: 79  Temp: 98.3 F (36.8 C)  Resp: 18   Filed Weights   03/19/16 0825  Weight: 150 lb 3.2 oz (68.13 kg)    GENERAL:alert, no distress and comfortable SKIN: skin color, texture, turgor are normal, no rashes or significant lesions EYES: normal, Conjunctiva are pink and non-injected, sclera clear OROPHARYNX:no exudate, no erythema and lips, buccal mucosa, and tongue normal  NECK: She has well healed surgical scars. No other abnormalities LYMPH:  no palpable lymphadenopathy in the cervical, axillary or inguinal LUNGS: clear to auscultation and percussion with normal breathing effort HEART: regular rate & rhythm and no murmurs and no lower extremity edema ABDOMEN:abdomen soft, non-tender and normal bowel sounds Musculoskeletal:no cyanosis of digits and no clubbing  NEURO: alert & oriented x 3 with fluent speech, no focal motor/sensory deficits  LABORATORY DATA:  I have reviewed the data as listed    Component Value Date/Time   NA 140 03/19/2016 0801   NA 140 11/01/2014 1424   K 4.3 03/19/2016 0801   K 4.2 11/01/2014 1424   CL 99 11/01/2014 1424   CL 102 08/31/2012 1336   CO2 29 03/19/2016 0801   CO2 26  11/01/2014 1424   GLUCOSE 103 03/19/2016 0801   GLUCOSE 101* 11/01/2014 1424   GLUCOSE 95 08/31/2012 1336   BUN 17.4 03/19/2016 0801   BUN 9 11/01/2014 1424   CREATININE 0.9 03/19/2016 0801   CREATININE 0.63 11/01/2014 1424   CALCIUM 9.1 03/19/2016 0801   CALCIUM 9.0 11/01/2014 1424   PROT 6.8 03/19/2016 0801   PROT 6.7 11/01/2014 1424   ALBUMIN 3.5 03/19/2016 0801   ALBUMIN 3.5 11/01/2014 1424   AST 14 03/19/2016 0801   AST 9 11/01/2014 1424   ALT 9 03/19/2016 0801   ALT 9 11/01/2014 1424   ALKPHOS 60 03/19/2016 0801   ALKPHOS 86 11/01/2014 1424   BILITOT 0.52 03/19/2016 0801   BILITOT 0.6 11/01/2014 1424   GFRNONAA >90 11/01/2014 1424   GFRAA >90 11/01/2014 1424    No results found for: SPEP, UPEP  Lab Results  Component Value Date   WBC 2.1* 03/19/2016   NEUTROABS 1.1* 03/19/2016   HGB 12.4 03/19/2016   HCT 38.1 03/19/2016   MCV 85.4 03/19/2016   PLT 214 03/19/2016      Chemistry      Component Value Date/Time   NA 140 03/19/2016 0801   NA 140 11/01/2014 1424   K 4.3 03/19/2016 0801   K 4.2 11/01/2014 1424   CL 99 11/01/2014 1424   CL 102 08/31/2012 1336   CO2 29 03/19/2016 0801   CO2 26 11/01/2014  1424   BUN 17.4 03/19/2016 0801   BUN 9 11/01/2014 1424   CREATININE 0.9 03/19/2016 0801   CREATININE 0.63 11/01/2014 1424      Component Value Date/Time   CALCIUM 9.1 03/19/2016 0801   CALCIUM 9.0 11/01/2014 1424   ALKPHOS 60 03/19/2016 0801   ALKPHOS 86 11/01/2014 1424   AST 14 03/19/2016 0801   AST 9 11/01/2014 1424   ALT 9 03/19/2016 0801   ALT 9 11/01/2014 1424   BILITOT 0.52 03/19/2016 0801   BILITOT 0.6 11/01/2014 1424      ASSESSMENT & PLAN:  History of lymphoma I am concerned about recurrence of disease. She has a lot of symptoms that cannot be explained such as night sweats, skin itching, profound fatigue and persistent leukopenia. I recommend CT scan for further evaluation and exclude lymphoma recurrence and she agreed to  proceed.  Chronic leukopenia  The cause is unknown. She is not symptomatic. I will order Ct scan as above     Change in bowel habits She complained of minor change in bowel habits with passage of dark stool, pelvic discomfort, pelvic dysfunction, etc. I will order a CT scan to exclude malignancy. She may benefit from repeat colonoscopy with her gastroenterologist in the near future   Orders Placed This Encounter  Procedures  . CT Soft Tissue Neck W Contrast    Standing Status: Future     Number of Occurrences:      Standing Expiration Date: 06/19/2017    Order Specific Question:  Reason for Exam (SYMPTOM  OR DIAGNOSIS REQUIRED)    Answer:  lymphoma, abnormal weight change, exclude cancer recurrence    Order Specific Question:  Is the patient pregnant?    Answer:  No    Order Specific Question:  Preferred imaging location?    Answer:  [facility]  . CT Chest W Contrast    Standing Status: Future     Number of Occurrences:      Standing Expiration Date: 05/19/2017    Order Specific Question:  Reason for Exam (SYMPTOM  OR DIAGNOSIS REQUIRED)    Answer:  lymphoma, abnormal weight change, exclude cancer recurrence    Order Specific Question:  Is the patient pregnant?    Answer:  No    Order Specific Question:  Preferred imaging location?    Answer:  [facility]  . CT Abdomen Pelvis W Contrast    Standing Status: Future     Number of Occurrences:      Standing Expiration Date: 06/19/2017    Order Specific Question:  Reason for Exam (SYMPTOM  OR DIAGNOSIS REQUIRED)    Answer:  lymphoma, abnormal weight change, exclude cancer recurrence    Order Specific Question:  Is the patient pregnant?    Answer:  No    Order Specific Question:  Preferred imaging location?    Answer:  [facility]   All questions were answered. The patient knows to call the clinic with any problems, questions or concerns. No barriers to learning was detected. I spent 15 minutes  counseling the patient face to face. The total time spent in the appointment was 20 minutes and more than 50% was on counseling and review of test results     [facility], Nuria Phebus, MD 03/19/2016 9:18 AM

## 2016-03-19 NOTE — Telephone Encounter (Signed)
per of to sch pt appt-gave pt copy of avs °

## 2016-03-19 NOTE — Assessment & Plan Note (Signed)
She complained of minor change in bowel habits with passage of dark stool, pelvic discomfort, pelvic dysfunction, etc. I will order a CT scan to exclude malignancy. She may benefit from repeat colonoscopy with her gastroenterologist in the near future

## 2016-03-19 NOTE — Assessment & Plan Note (Signed)
The cause is unknown. She is not symptomatic. I will order Ct scan as above

## 2016-03-19 NOTE — Assessment & Plan Note (Signed)
I am concerned about recurrence of disease. She has a lot of symptoms that cannot be explained such as night sweats, skin itching, profound fatigue and persistent leukopenia. I recommend CT scan for further evaluation and exclude lymphoma recurrence and she agreed to proceed.

## 2016-03-25 DIAGNOSIS — K625 Hemorrhage of anus and rectum: Secondary | ICD-10-CM | POA: Diagnosis not present

## 2016-03-25 DIAGNOSIS — Z800 Family history of malignant neoplasm of digestive organs: Secondary | ICD-10-CM | POA: Diagnosis not present

## 2016-03-25 DIAGNOSIS — R1030 Lower abdominal pain, unspecified: Secondary | ICD-10-CM | POA: Diagnosis not present

## 2016-03-25 DIAGNOSIS — K921 Melena: Secondary | ICD-10-CM | POA: Diagnosis not present

## 2016-03-25 DIAGNOSIS — R112 Nausea with vomiting, unspecified: Secondary | ICD-10-CM | POA: Diagnosis not present

## 2016-03-26 ENCOUNTER — Ambulatory Visit (HOSPITAL_COMMUNITY)
Admission: RE | Admit: 2016-03-26 | Discharge: 2016-03-26 | Disposition: A | Discharge status: Home / Self Care | Payer: 59 | Source: Ambulatory Visit | Attending: Hematology and Oncology | Admitting: Hematology and Oncology

## 2016-03-26 ENCOUNTER — Encounter (HOSPITAL_COMMUNITY): Payer: Self-pay

## 2016-03-26 ENCOUNTER — Other Ambulatory Visit: Payer: Self-pay | Admitting: *Deleted

## 2016-03-26 ENCOUNTER — Other Ambulatory Visit: Payer: Self-pay | Admitting: Hematology and Oncology

## 2016-03-26 DIAGNOSIS — R938 Abnormal findings on diagnostic imaging of other specified body structures: Secondary | ICD-10-CM | POA: Insufficient documentation

## 2016-03-26 DIAGNOSIS — D72819 Decreased white blood cell count, unspecified: Secondary | ICD-10-CM | POA: Insufficient documentation

## 2016-03-26 DIAGNOSIS — Z8572 Personal history of non-Hodgkin lymphomas: Secondary | ICD-10-CM | POA: Diagnosis not present

## 2016-03-26 DIAGNOSIS — K228 Other specified diseases of esophagus: Secondary | ICD-10-CM | POA: Insufficient documentation

## 2016-03-26 DIAGNOSIS — R918 Other nonspecific abnormal finding of lung field: Secondary | ICD-10-CM | POA: Diagnosis not present

## 2016-03-26 DIAGNOSIS — R194 Change in bowel habit: Secondary | ICD-10-CM | POA: Diagnosis not present

## 2016-03-26 DIAGNOSIS — M4802 Spinal stenosis, cervical region: Secondary | ICD-10-CM | POA: Diagnosis not present

## 2016-03-26 DIAGNOSIS — E890 Postprocedural hypothyroidism: Secondary | ICD-10-CM | POA: Diagnosis not present

## 2016-03-26 DIAGNOSIS — R935 Abnormal findings on diagnostic imaging of other abdominal regions, including retroperitoneum: Secondary | ICD-10-CM | POA: Diagnosis not present

## 2016-03-26 DIAGNOSIS — Z90710 Acquired absence of both cervix and uterus: Secondary | ICD-10-CM | POA: Diagnosis not present

## 2016-03-26 DIAGNOSIS — H0520 Unspecified exophthalmos: Secondary | ICD-10-CM | POA: Insufficient documentation

## 2016-03-26 DIAGNOSIS — Z9884 Bariatric surgery status: Secondary | ICD-10-CM | POA: Diagnosis not present

## 2016-03-26 DIAGNOSIS — J358 Other chronic diseases of tonsils and adenoids: Secondary | ICD-10-CM | POA: Diagnosis not present

## 2016-03-26 MED ORDER — IOPAMIDOL (ISOVUE-300) INJECTION 61%
100.0000 mL | Freq: Once | INTRAVENOUS | Status: AC | PRN
  Administered 2016-03-26: 100 mL via INTRAVENOUS

## 2016-03-27 ENCOUNTER — Ambulatory Visit (HOSPITAL_BASED_OUTPATIENT_CLINIC_OR_DEPARTMENT_OTHER): Payer: 59

## 2016-03-27 ENCOUNTER — Ambulatory Visit (HOSPITAL_BASED_OUTPATIENT_CLINIC_OR_DEPARTMENT_OTHER): Payer: 59 | Admitting: Hematology and Oncology

## 2016-03-27 ENCOUNTER — Telehealth: Payer: Self-pay | Admitting: Hematology and Oncology

## 2016-03-27 ENCOUNTER — Telehealth: Payer: Self-pay | Admitting: *Deleted

## 2016-03-27 ENCOUNTER — Encounter: Payer: Self-pay | Admitting: Hematology and Oncology

## 2016-03-27 VITALS — BP 140/75 | HR 95 | Temp 98.6°F | Resp 18 | Ht 63.0 in | Wt 150.4 lb

## 2016-03-27 DIAGNOSIS — D708 Other neutropenia: Secondary | ICD-10-CM | POA: Diagnosis not present

## 2016-03-27 DIAGNOSIS — Z8579 Personal history of other malignant neoplasms of lymphoid, hematopoietic and related tissues: Secondary | ICD-10-CM

## 2016-03-27 DIAGNOSIS — D72819 Decreased white blood cell count, unspecified: Secondary | ICD-10-CM

## 2016-03-27 DIAGNOSIS — Z8572 Personal history of non-Hodgkin lymphomas: Secondary | ICD-10-CM | POA: Diagnosis not present

## 2016-03-27 MED ORDER — TBO-FILGRASTIM 480 MCG/0.8ML ~~LOC~~ SOSY
480.0000 ug | PREFILLED_SYRINGE | Freq: Once | SUBCUTANEOUS | Status: AC
  Administered 2016-03-27: 480 ug via SUBCUTANEOUS
  Filled 2016-03-27: qty 0.8

## 2016-03-27 NOTE — Patient Instructions (Signed)

## 2016-03-27 NOTE — Telephone Encounter (Signed)
Per staff phone call and POF I have schedueld appts. Scheduler advised of appts.  JMW  

## 2016-03-27 NOTE — Telephone Encounter (Signed)
per pof to sch pt appt-gave pt copy of avs °

## 2016-03-27 NOTE — Assessment & Plan Note (Signed)
CT scan showed no evidence of lymphoma. I will get her case presented and imaging study reviews at the  next hematology tumor board. I plan to see her back in 3 months for further assessment

## 2016-03-27 NOTE — Progress Notes (Signed)
[place] OFFICE PROGRESS NOTE  Patient Care Team: Jacelyn Pi, MD as PCP - General (Endocrinology) Megan Salon, MD as Consulting Physician (Gynecology) Heath Lark, MD as Consulting Physician (Hematology and Oncology) Leota Sauers, RN as Oncology Nurse Navigator Eppie Gibson, MD as Attending Physician (Radiation Oncology) Frederik Pear, MD as Consulting Physician (Orthopedic Surgery) Fanny Skates, MD as Consulting Physician (General Surgery)  SUMMARY OF ONCOLOGIC HISTORY: Oncology History   DLBCL (diffuse large B cell lymphoma)   Staging form: Lymphoid Neoplasms, AJCC 6th Edition     Clinical stage from 11/15/2014: Stage II - Signed by Heath Lark, MD on 11/15/2014       History of lymphoma   09/14/2014 Imaging PET/CT scan showed Hypermetabolic bilateral cervical lymphadenopathy, left side greater than right, and sub-cm hypermetabolic left subpectoral lymph node. Hypermetabolic activity throughout the lingular and palatine tonsils in Waldeyer's ring   10/12/2014 Procedure Biopsy of the left neck lymph node came back positive for diffuse large B-cell lymphoma.Accession: [id number]   10/24/2014 Procedure PAC placed.   10/24/2014 Bone Marrow Biopsy Bone marrow biopsy was negative.Cytogenetics were normal   10/24/2014 Imaging Echocardiogram showed normal ejection fraction   10/25/2014 - 12/06/2014 Chemotherapy She received 3 cyles of R CHOP chemotherapy. Further doses of R CHOP chemotherapy was reduced due to recent neutropenic fever with cycle 1 of treatment.   11/01/2014 Adverse Reaction She was seen in the emergency Department for possible neutropenic fever. Cultures were negative. She received antibiotic therapy.   11/15/2014 - 12/06/2014 Chemotherapy Intrathecal chemotherapy is added due to risk of CNS relapse   01/04/2015 Imaging PET CT scan show complete remission from lymphadenopathy. Incidental finding of right shoulder arthropathy   01/16/2015 Imaging MRI of the  right shoulder show no evidence of cancer, with evidence of tendon tear and possible changes related to trauma   02/05/2015 - 02/27/2015 Radiation Therapy IMRT to tonsils, neck and L subpectoral nodal bed:  30.6 Gy in 17 fractions.   04/25/2015 Imaging CT neck, chest, abdomen/pelvis:  No findings for active lymphomatous involvement.   07/16/2015 Imaging PET/ CT scans show no evidence of lymphoma. It showed persistent abnormality in the right humeral area   10/18/2015 Procedure PAC removed.   03/26/2016 Imaging CT scan of chest, abdomen and pelvis showed no evidence of lymphoma. Noted changes suspicious of infection   03/26/2016 Imaging CT neck showed asymmetry of the lower palatine tonsils with slight fullness on the left and underfilling of air column at this level.    INTERVAL HISTORY: Please see below for problem oriented charting. She returns to review test results. She continues to feel fatigue. Denies swallowing problems. No recent fever or chills  REVIEW OF SYSTEMS:   Constitutional: Denies fevers, chills or abnormal weight loss Eyes: Denies blurriness of vision Ears, nose, mouth, throat, and face: Denies mucositis or sore throat Respiratory: Denies cough, dyspnea or wheezes Cardiovascular: Denies palpitation, chest discomfort or lower extremity swelling Gastrointestinal:  Denies nausea, heartburn or change in bowel habits Skin: Denies abnormal skin rashes Lymphatics: Denies new lymphadenopathy or easy bruising Neurological:Denies numbness, tingling or new weaknesses Behavioral/Psych: Mood is stable, no new changes  All other systems were reviewed with the patient and are negative.  I have reviewed the past medical history, past surgical history, social history and family history with the patient and they are unchanged from previous note.  ALLERGIES:  is allergic to metformin and related; tape; levaquin; and penicillins.  MEDICATIONS:  Current Outpatient Prescriptions  Medication Sig  Dispense Refill  . aspirin EC 81 MG tablet Take 81 mg by mouth every morning.     . Biotin 5000 MCG TABS Take 1 tablet by mouth every morning. Reported on 02/18/2016    . cyanocobalamin 500 MCG tablet Take 500 mcg by mouth daily.    . Multiple Vitamin (MULITIVITAMIN WITH MINERALS) TABS Take 1 tablet by mouth every morning.     [name] SYNTHROID 112 MCG tablet Take 112 mcg by mouth daily.  12  . Vitamin D, Cholecalciferol, 1000 UNITS TABS Take 1,000 Units by mouth daily at 12 noon.     . [DISCONTINUED] calcium-vitamin D (OSCAL WITH D) 500-200 MG-UNIT per tablet Take 1 tablet by mouth daily.     No current facility-administered medications for this visit.    PHYSICAL EXAMINATION: ECOG PERFORMANCE STATUS: 1 - Symptomatic but completely ambulatory  Filed Vitals:   03/27/16 0929  BP: 140/75  Pulse: 95  Temp: 98.6 F (37 C)  Resp: 18   Filed Weights   03/27/16 0929  Weight: 150 lb 6.4 oz (68.221 kg)    GENERAL:alert, no distress and comfortable SKIN: skin color, texture, turgor are normal, no rashes or significant lesions EYES: normal, Conjunctiva are pink and non-injected, sclera clear Musculoskeletal:no cyanosis of digits and no clubbing  NEURO: alert & oriented x 3 with fluent speech, no focal motor/sensory deficits  LABORATORY DATA:  I have reviewed the data as listed    Component Value Date/Time   NA 140 03/19/2016 0801   NA 140 11/01/2014 1424   K 4.3 03/19/2016 0801   K 4.2 11/01/2014 1424   CL 99 11/01/2014 1424   CL 102 08/31/2012 1336   CO2 29 03/19/2016 0801   CO2 26 11/01/2014 1424   GLUCOSE 103 03/19/2016 0801   GLUCOSE 101* 11/01/2014 1424   GLUCOSE 95 08/31/2012 1336   BUN 17.4 03/19/2016 0801   BUN 9 11/01/2014 1424   CREATININE 0.9 03/19/2016 0801   CREATININE 0.63 11/01/2014 1424   CALCIUM 9.1 03/19/2016 0801   CALCIUM 9.0 11/01/2014 1424   PROT 6.8 03/19/2016 0801   PROT 6.7 11/01/2014 1424   ALBUMIN 3.5 03/19/2016 0801   ALBUMIN 3.5 11/01/2014 1424    AST 14 03/19/2016 0801   AST 9 11/01/2014 1424   ALT 9 03/19/2016 0801   ALT 9 11/01/2014 1424   ALKPHOS 60 03/19/2016 0801   ALKPHOS 86 11/01/2014 1424   BILITOT 0.52 03/19/2016 0801   BILITOT 0.6 11/01/2014 1424   GFRNONAA >90 11/01/2014 1424   GFRAA >90 11/01/2014 1424    No results found for: SPEP, UPEP  Lab Results  Component Value Date   WBC 2.1* 03/19/2016   NEUTROABS 1.1* 03/19/2016   HGB 12.4 03/19/2016   HCT 38.1 03/19/2016   MCV 85.4 03/19/2016   PLT 214 03/19/2016      Chemistry      Component Value Date/Time   NA 140 03/19/2016 0801   NA 140 11/01/2014 1424   K 4.3 03/19/2016 0801   K 4.2 11/01/2014 1424   CL 99 11/01/2014 1424   CL 102 08/31/2012 1336   CO2 29 03/19/2016 0801   CO2 26 11/01/2014 1424   BUN 17.4 03/19/2016 0801   BUN 9 11/01/2014 1424   CREATININE 0.9 03/19/2016 0801   CREATININE 0.63 11/01/2014 1424      Component Value Date/Time   CALCIUM 9.1 03/19/2016 0801   CALCIUM 9.0 11/01/2014 1424   ALKPHOS 60 03/19/2016 0801   ALKPHOS 86  11/01/2014 1424   AST 14 03/19/2016 0801   AST 9 11/01/2014 1424   ALT 9 03/19/2016 0801   ALT 9 11/01/2014 1424   BILITOT 0.52 03/19/2016 0801   BILITOT 0.6 11/01/2014 1424       RADIOGRAPHIC STUDIES: I have personally reviewed the radiological images as listed and agreed with the findings in the report. Ct Soft Tissue Neck W Contrast  03/26/2016  CLINICAL DATA:  49 year old female with history of B-cell lymphoma. Post chemotherapy completed 2016. Thyroid cancer post thyroidectomy. Subsequent encounter. EXAM: CT NECK WITH CONTRAST TECHNIQUE: Multidetector CT imaging of the neck was performed using the standard protocol following the bolus administration of intravenous contrast. CONTRAST:  166m ISOVUE-300 IOPAMIDOL (ISOVUE-300) INJECTION 61% COMPARISON:  07/16/2015 and 09/14/2014 PET-CT. 06/19/2015 sinus CT. 04/25/2015 neck CT. 12/25/2008 neck CT. CT scan of the chest, abdomen and pelvis performed  03/26/2016 dictated separately. FINDINGS: Pharynx and larynx: Asymmetry of the lower palatine tonsils with slight fullness on the left and underfilling of air column at this level. This appears different than on 2016 examination. It is possible this represents coaptation of membranes however, primary tonsil abnormality not excluded. Salivary glands: Negative. Thyroid: Post thyroidectomy. Lymph nodes: No adenopathy or necrotic lymph node. Vascular: No high-grade stenosis or vascular occlusion. Limited intracranial: Negative Visualized orbits: Exophthalmos. Mastoids and visualized paranasal sinuses: Visualized aspects are clear. Skeleton: No osseous destructive lesion. 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 Upper chest: Please see chest CT report. IMPRESSION: Asymmetry of the lower palatine tonsils with slight fullness on the left and underfilling of air column at this level. This appears different than on 2016 examination. It is possible this represents coaptation of membranes however, primary tonsil abnormality not excluded. Post thyroidectomy. No neck adenopathy or necrotic lymph node. 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 CT scan of the chest, abdomen and pelvis performed 03/26/2016 dictated separately. Electronically Signed   By: SGenia DelM.D.   On: 03/26/2016 10:40   Ct Chest W Contrast  03/26/2016  CLINICAL DATA:  Patient with history of large B-cell lymphoma diagnosed in 2015. EXAM: CT CHEST, ABDOMEN, AND PELVIS WITH CONTRAST TECHNIQUE: Multidetector CT imaging of the chest, abdomen and pelvis was performed following the standard protocol during bolus administration of intravenous contrast. CONTRAST:  1021mISOVUE-300 IOPAMIDOL (ISOVUE-300)  INJECTION 61% COMPARISON:  PET-CT 07/16/2015; CT CAP 04/25/2015 FINDINGS: CT CHEST FINDINGS Mediastinum/Lymph Nodes: No enlarged axillary, mediastinal or hilar lymphadenopathy. Normal heart size. No pericardial effusion. Aorta and main pulmonary artery are normal in caliber. The esophagus is patulous. Lungs/Pleura: Central airways are patent. Interval development of multiple patchy ground-glass opacities within the medial aspect of the right lower lobe (image 85; series 7). Additional subpleural ground-glass nodular opacity measuring 8 mm within the right lower lobe (image 56; series 7). No pleural effusion or pneumothorax. Musculoskeletal: Grossly unchanged lucency within the right humeral head. No aggressive or acute appearing osseous lesions. Thoracic spine degenerative changes. CT ABDOMEN PELVIS FINDINGS Hepatobiliary: The liver is normal in size and contour. No focal hepatic lesion is identified. Gallbladder is decompressed. Pancreas: Unremarkable Spleen: Unremarkable Adrenals/Urinary Tract: The adrenal glands are normal. Kidneys enhance symmetrically with contrast. No hydronephrosis. Urinary bladder is unremarkable. Stomach/Bowel: No abnormal bowel wall thickening or evidence for bowel obstruction. Gastric band is present. No  free intraperitoneal air. Vascular/Lymphatic: Normal caliber abdominal aorta. No retroperitoneal lymphadenopathy. Other: Patient status post hysterectomy. Adnexal structures are unremarkable. Small amount of pelvic free fluid. Musculoskeletal: No aggressive or acute appearing osseous lesions. Lumbar spine degenerative changes. IMPRESSION: Patchy ground-glass opacities within the right lower lobe are nonspecific however likely infectious/inflammatory in etiology. Recommend attention on followup. Grossly unchanged lucency within the right humeral head, potentially representing an area of osteonecrosis. No pathologic adenopathy within the chest, abdomen or pelvis. See dedicated neck CT  report. Electronically Signed   By: Lovey Newcomer M.D.   On: 03/26/2016 10:04   Ct Abdomen Pelvis W Contrast  03/26/2016  CLINICAL DATA:  Patient with history of large B-cell lymphoma diagnosed in 2015. EXAM: CT CHEST, ABDOMEN, AND PELVIS WITH CONTRAST TECHNIQUE: Multidetector CT imaging of the chest, abdomen and pelvis was performed following the standard protocol during bolus administration of intravenous contrast. CONTRAST:  130m ISOVUE-300 IOPAMIDOL (ISOVUE-300) INJECTION 61% COMPARISON:  PET-CT 07/16/2015; CT CAP 04/25/2015 FINDINGS: CT CHEST FINDINGS Mediastinum/Lymph Nodes: No enlarged axillary, mediastinal or hilar lymphadenopathy. Normal heart size. No pericardial effusion. Aorta and main pulmonary artery are normal in caliber. The esophagus is patulous. Lungs/Pleura: Central airways are patent. Interval development of multiple patchy ground-glass opacities within the medial aspect of the right lower lobe (image 85; series 7). Additional subpleural ground-glass nodular opacity measuring 8 mm within the right lower lobe (image 56; series 7). No pleural effusion or pneumothorax. Musculoskeletal: Grossly unchanged lucency within the right humeral head. No aggressive or acute appearing osseous lesions. Thoracic spine degenerative changes. CT ABDOMEN PELVIS FINDINGS Hepatobiliary: The liver is normal in size and contour. No focal hepatic lesion is identified. Gallbladder is decompressed. Pancreas: Unremarkable Spleen: Unremarkable Adrenals/Urinary Tract: The adrenal glands are normal. Kidneys enhance symmetrically with contrast. No hydronephrosis. Urinary bladder is unremarkable. Stomach/Bowel: No abnormal bowel wall thickening or evidence for bowel obstruction. Gastric band is present. No free intraperitoneal air. Vascular/Lymphatic: Normal caliber abdominal aorta. No retroperitoneal lymphadenopathy. Other: Patient status post hysterectomy. Adnexal structures are unremarkable. Small amount of pelvic free  fluid. Musculoskeletal: No aggressive or acute appearing osseous lesions. Lumbar spine degenerative changes. IMPRESSION: Patchy ground-glass opacities within the right lower lobe are nonspecific however likely infectious/inflammatory in etiology. Recommend attention on followup. Grossly unchanged lucency within the right humeral head, potentially representing an area of osteonecrosis. No pathologic adenopathy within the chest, abdomen or pelvis. See dedicated neck CT report. Electronically Signed   By: DLovey NewcomerM.D.   On: 03/26/2016 10:04     ASSESSMENT & PLAN:  History of lymphoma CT scan showed no evidence of lymphoma. I will get her case presented and imaging study reviews at the  next hematology tumor board. 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 Imaging studies suggest that she may have recent respiratory tract infection. I recommend 1 dose of Neupogen today and reassess in 3 months. I will check her immunoglobulin levels in her next visit   Orders Placed This Encounter  Procedures  . CBC with Differential/Platelet    Standing Status: Future     Number of Occurrences:      Standing Expiration Date: 05/01/2017  . Comprehensive metabolic panel    Standing Status: Future     Number of Occurrences:      Standing Expiration Date: 05/01/2017  . IgG, IgA, IgM    Standing Status: Future     Number of Occurrences:  Standing Expiration Date: 05/01/2017   All questions were answered. The patient knows to call the clinic with any problems, questions or concerns. No barriers to learning was detected. I spent 15 minutes counseling the patient face to face. The total time spent in the appointment was 20 minutes and more than 50% was on counseling and review of test results     [facility], [place], MD 03/27/2016 11:43 AM

## 2016-03-27 NOTE — Assessment & Plan Note (Signed)
She has chronic neutropenia since treatment. Imaging studies suggest that she may have recent respiratory tract infection. I recommend 1 dose of Neupogen today and reassess in 3 months. I will check her immunoglobulin levels in her next visit

## 2016-03-31 ENCOUNTER — Other Ambulatory Visit: Payer: Self-pay | Admitting: Hematology and Oncology

## 2016-03-31 ENCOUNTER — Telehealth: Payer: Self-pay | Admitting: *Deleted

## 2016-03-31 NOTE — Telephone Encounter (Signed)
I reviewed imaging study with the patient over the telephone. She has a lot of questions as she is having a lot of pain in the right side of her abdomen. We review her imaging study this morning at the hematology tumor board and there were no findings to suggest residual disease or recurrence of lymphoma. On review of her abdominal imaging, it appears that the patient has significant amount of stool. I recommend a trial of bowel rest and liquid diet and close follow-up with her urologist and GI physician for further management.

## 2016-03-31 NOTE — Telephone Encounter (Signed)
Pt received message from Dr. Alvy Bimler that there is nothing to worry about on her CT scan.  She reports a "tremendous amount of pain" in her right side of abdomen.  She takes ibuprofen and uses heating pad at night.  She asks if CT shows any cause for her pain?  Says she read results and asks if "decompressed gallbladder" could cause right sided pain in abdomen?

## 2016-04-02 DIAGNOSIS — M6281 Muscle weakness (generalized): Secondary | ICD-10-CM | POA: Diagnosis not present

## 2016-04-02 DIAGNOSIS — R278 Other lack of coordination: Secondary | ICD-10-CM | POA: Diagnosis not present

## 2016-04-02 DIAGNOSIS — N8111 Cystocele, midline: Secondary | ICD-10-CM | POA: Diagnosis not present

## 2016-04-02 DIAGNOSIS — N3941 Urge incontinence: Secondary | ICD-10-CM | POA: Diagnosis not present

## 2016-04-15 DIAGNOSIS — M722 Plantar fascial fibromatosis: Secondary | ICD-10-CM

## 2016-04-16 DIAGNOSIS — N8111 Cystocele, midline: Secondary | ICD-10-CM | POA: Diagnosis not present

## 2016-04-16 DIAGNOSIS — R278 Other lack of coordination: Secondary | ICD-10-CM | POA: Diagnosis not present

## 2016-04-16 DIAGNOSIS — N3941 Urge incontinence: Secondary | ICD-10-CM | POA: Diagnosis not present

## 2016-04-16 DIAGNOSIS — E890 Postprocedural hypothyroidism: Secondary | ICD-10-CM | POA: Diagnosis not present

## 2016-04-16 DIAGNOSIS — M6281 Muscle weakness (generalized): Secondary | ICD-10-CM | POA: Diagnosis not present

## 2016-04-30 DIAGNOSIS — M6281 Muscle weakness (generalized): Secondary | ICD-10-CM | POA: Diagnosis not present

## 2016-04-30 DIAGNOSIS — R278 Other lack of coordination: Secondary | ICD-10-CM | POA: Diagnosis not present

## 2016-04-30 DIAGNOSIS — N8111 Cystocele, midline: Secondary | ICD-10-CM | POA: Diagnosis not present

## 2016-04-30 DIAGNOSIS — N3941 Urge incontinence: Secondary | ICD-10-CM | POA: Diagnosis not present

## 2016-05-01 MED FILL — SYNTHROID 100 MCG TABLET: 100 | 90 days supply | Qty: 90 | Fill #0

## 2016-05-28 ENCOUNTER — Other Ambulatory Visit: Payer: Self-pay | Admitting: Hematology and Oncology

## 2016-06-17 ENCOUNTER — Encounter (HOSPITAL_COMMUNITY): Payer: Self-pay | Admitting: *Deleted

## 2016-06-17 NOTE — Progress Notes (Signed)
Pt denies cardiac history, chest pain or sob.   EKG- 11/01/14 - in EPIC Echo -10/24/14 - in EPIC

## 2016-06-18 ENCOUNTER — Ambulatory Visit (HOSPITAL_COMMUNITY)
Admission: EM | Admit: 2016-06-18 | Discharge: 2016-06-18 | Disposition: A | Discharge status: Home / Self Care | Payer: 59 | Attending: Emergency Medicine | Admitting: Emergency Medicine

## 2016-06-18 ENCOUNTER — Other Ambulatory Visit (HOSPITAL_BASED_OUTPATIENT_CLINIC_OR_DEPARTMENT_OTHER): Payer: 59

## 2016-06-18 ENCOUNTER — Encounter (HOSPITAL_COMMUNITY): Payer: Self-pay | Admitting: Emergency Medicine

## 2016-06-18 DIAGNOSIS — Z8579 Personal history of other malignant neoplasms of lymphoid, hematopoietic and related tissues: Secondary | ICD-10-CM | POA: Diagnosis not present

## 2016-06-18 DIAGNOSIS — H00016 Hordeolum externum left eye, unspecified eyelid: Secondary | ICD-10-CM | POA: Diagnosis not present

## 2016-06-18 DIAGNOSIS — Z8572 Personal history of non-Hodgkin lymphomas: Secondary | ICD-10-CM | POA: Diagnosis not present

## 2016-06-18 DIAGNOSIS — D72819 Decreased white blood cell count, unspecified: Secondary | ICD-10-CM

## 2016-06-18 LAB — CBC WITH DIFFERENTIAL/PLATELET
BASO%: 0.5 % (ref 0.0–2.0)
Basophils Absolute: 0 10*3/uL (ref 0.0–0.1)
EOS%: 4.5 % (ref 0.0–7.0)
Eosinophils Absolute: 0.1 10*3/uL (ref 0.0–0.5)
HEMATOCRIT: 36.8 % (ref 34.8–46.6)
HGB: 12.3 g/dL (ref 11.6–15.9)
LYMPH%: 32.7 % (ref 14.0–49.7)
MCH: 28.6 pg (ref 25.1–34.0)
MCHC: 33.4 g/dL (ref 31.5–36.0)
MCV: 85.6 fL (ref 79.5–101.0)
MONO#: 0.4 10*3/uL (ref 0.1–0.9)
MONO%: 16.8 % — ABNORMAL HIGH (ref 0.0–14.0)
NEUT#: 1 10*3/uL — ABNORMAL LOW (ref 1.5–6.5)
NEUT%: 45.5 % (ref 38.4–76.8)
PLATELETS: 265 10*3/uL (ref 145–400)
RBC: 4.3 10*6/uL (ref 3.70–5.45)
RDW: 13.4 % (ref 11.2–14.5)
WBC: 2.2 10*3/uL — AB (ref 3.9–10.3)
lymph#: 0.7 10*3/uL — ABNORMAL LOW (ref 0.9–3.3)

## 2016-06-18 LAB — COMPREHENSIVE METABOLIC PANEL
ALT: 10 U/L (ref 0–55)
AST: 14 U/L (ref 5–34)
Albumin: 3.5 g/dL (ref 3.5–5.0)
Alkaline Phosphatase: 72 U/L (ref 40–150)
Anion Gap: 9 mEq/L (ref 3–11)
BILIRUBIN TOTAL: 0.36 mg/dL (ref 0.20–1.20)
BUN: 14 mg/dL (ref 7.0–26.0)
CALCIUM: 8.8 mg/dL (ref 8.4–10.4)
CHLORIDE: 104 meq/L (ref 98–109)
CO2: 27 meq/L (ref 22–29)
CREATININE: 0.9 mg/dL (ref 0.6–1.1)
EGFR: 89 mL/min/{1.73_m2} — AB (ref >90)
Glucose: 105 mg/dl (ref 70–140)
Potassium: 3.8 mEq/L (ref 3.5–5.1)
Sodium: 140 mEq/L (ref 136–145)
TOTAL PROTEIN: 6.7 g/dL (ref 6.4–8.3)

## 2016-06-18 MED ORDER — LIDOCAINE-EPINEPHRINE (PF) 2 %-1:200000 IJ SOLN
INTRAMUSCULAR | Status: AC
  Filled 2016-06-18: qty 20

## 2016-06-18 MED ORDER — ERYTHROMYCIN 5 MG/GM OP OINT: TOPICAL_OINTMENT | OPHTHALMIC | Status: DC

## 2016-06-18 MED FILL — ERYTHROMYCIN EYE OINTMENT: 5 | 7 days supply | Qty: 4 | Fill #0

## 2016-06-18 MED FILL — GAVILYTE-N SOLUTION: 420 | 1 days supply | Qty: 4000 | Fill #0

## 2016-06-18 NOTE — Discharge Instructions (Signed)
Stye Warm compresses frequently. Return for any problems or worsening or increased redness around the eye, more swelling or pain. A stye is a bump on your eyelid caused by a bacterial infection. A stye can form inside the eyelid (internal stye) or outside the eyelid (external stye). An internal stye may be caused by an infected oil-producing gland inside your eyelid. An external stye may be caused by an infection at the base of your eyelash (hair follicle). Styes are very common. Anyone can get them at any age. They usually occur in just one eye, but you may have more than one in either eye.  CAUSES  The infection is almost always caused by bacteria called Staphylococcus aureus. This is a common type of bacteria that lives on your skin. RISK FACTORS You may be at higher risk for a stye if you have had one before. You may also be at higher risk if you have:  Diabetes.  Long-term illness.  Long-term eye redness.  A skin condition called seborrhea.  High fat levels in your blood (lipids). SIGNS AND SYMPTOMS  Eyelid pain is the most common symptom of a stye. Internal styes are more painful than external styes. Other signs and symptoms may include:  Painful swelling of your eyelid.  A scratchy feeling in your eye.  Tearing and redness of your eye.  Pus draining from the stye. DIAGNOSIS  Your health care provider may be able to diagnose a stye just by examining your eye. The health care provider may also check to make sure:  You do not have a fever or other signs of a more serious infection.  The infection has not spread to other parts of your eye or areas around your eye. TREATMENT  Most styes will clear up in a few days without treatment. In some cases, you may need to use antibiotic drops or ointment to prevent infection. Your health care provider may have to drain the stye surgically if your stye is:  Large.  Causing a lot of pain.  Interfering with your vision. This can be  done using a thin blade or a needle.  HOME CARE INSTRUCTIONS   Take medicines only as directed by your health care provider.  Apply a clean, warm compress to your eye for 10 minutes, 4 times a day.  Do not wear contact lenses or eye makeup until your stye has healed.  Do not try to pop or drain the stye. SEEK MEDICAL CARE IF:  You have chills or a fever.  Your stye does not go away after several days.  Your stye affects your vision.  Your eyeball becomes swollen, red, or painful. MAKE SURE YOU:  Understand these instructions.  Will watch your condition.  Will get help right away if you are not doing well or get worse.   This information is not intended to replace advice given to you by your health care provider. Make sure you discuss any questions you have with your health care provider.   Document Released: 09/09/2005 Document Revised: 12/21/2014 Document Reviewed: 03/16/2014 Elsevier Interactive Patient Education [employer].

## 2016-06-18 NOTE — ED Provider Notes (Signed)
CSN: [id number]     Arrival date & time 06/18/16  [id number] History   First MD Initiated Contact with Patient 06/18/16 1034     Chief Complaint  Patient presents with  . Stye   (Consider location/radiation/quality/duration/timing/severity/associated sxs/prior Treatment) HPI Comments: 50 year old female complains of a sty to the left eye that started developing 2 weeks ago. It has increased in size over the past few days. She has noticed some draining. Denies involvement of the left eye. No visual changes or pain within the eye.   Past Medical History  Diagnosis Date  . Hypercholesterolemia   . Thyroid cancer ([place])   . Hypothyroidism   . Wears glasses   . DLBCL (diffuse large B cell lymphoma) ([place])   . Neutropenic fever ([place]) 11/01/2014  . Cancer [facility]) 2007    thyroid, papillary  . S/P radiation therapy 02/05/15-02/27/15    tonsil/neck,lt subpectoral nodal bed 30.6Gy /17 fx  . Arthritis of shoulder region, right, degenerative     right shoulder   . History of B-cell lymphoma 11/18/2015  . Change in bowel habits 03/19/2016  . Kidney stones   . Constipation   . Bladder prolapse, female, acquired   . PONV (postoperative nausea and vomiting)     during hysterectomy    Past Surgical History  Procedure Laterality Date  . 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  . Tubal ligation  1990  . Abdominal hysterectomy  2000  . Colonoscopy    . Lymph node biopsy Left 10/12/2014    Procedure: excisional biopsy deep left neck lymph node;  Surgeon: Fanny Skates, MD;  Location: [place];  Service: General;  Laterality: Left;  . Laparoscopic gastric banding with hiatal hernia repair  05/28/2009  . Portacath placement Right 10/24/2014    Procedure: INSERTION PORT-A-CATH WITH ULTRASOUND GUIDANCE;  Surgeon: Fanny Skates, MD;  Location: [place];  Service: General;  Laterality: Right;  . Port-a-cath removal N/A  10/18/2015    Procedure: REMOVAL PORT-A-CATH;  Surgeon: Fanny Skates, MD;  Location: [facility];  Service: General;  Laterality: N/A;   Family History  Problem Relation Age of Onset  . Colon cancer Mother   . Cancer Mother     colon ca  . Cancer Father     prostate  . Hypertension Father   . Hyperlipidemia Father   . Stroke Father   . Prostate cancer Father   . Cancer Paternal Grandmother     female organ ca   Social History  Substance Use Topics  . Smoking status: Never Smoker   . Smokeless tobacco: Never Used  . Alcohol Use: No   OB History    Gravida Para Term Preterm AB TAB SAB Ectopic Multiple Living   3 2   1  1   2      Review of Systems  Constitutional: Negative.   HENT: Negative.   Eyes: Negative for photophobia, discharge, redness, itching and visual disturbance.  Respiratory: Negative.   Neurological: Negative.   All other systems reviewed and are negative.   Allergies  Metformin and related; Tape; Levaquin; and Penicillins  Home Medications   Prior to Admission medications   Medication Sig Start Date End Date Taking? Authorizing Provider  aspirin EC 81 MG tablet Take 81 mg by mouth every morning.    Yes Historical Provider, MD  Biotin 5000 MCG TABS Take 1 tablet by mouth every morning. Reported on 02/18/2016   Yes  Historical Provider, MD  cyanocobalamin 500 MCG tablet Take 500 mcg by mouth daily.   Yes Historical Provider, MD  Multiple Vitamin (MULITIVITAMIN WITH MINERALS) TABS Take 1 tablet by mouth every morning.    Yes Historical Provider, MD  polyethylene glycol (MIRALAX / GLYCOLAX) packet Take 17 g by mouth daily as needed.   Yes Historical Provider, MD  SYNTHROID 112 MCG tablet Take 100 mcg by mouth daily.  10/23/15  Yes Historical Provider, MD  Vitamin D, Cholecalciferol, 1000 UNITS TABS Take 1,000 Units by mouth daily at 12 noon.    Yes Historical Provider, MD   Meds Ordered and Administered this Visit  Medications - No data to display  BP 120/84  mmHg  Pulse 101  Temp(Src) 98.3 F (36.8 C) (Oral)  Resp 16  SpO2 98%  LMP 04/13/2000 (Exact Date) No data found.   Physical Exam  Constitutional: She is oriented to person, place, and time. She appears well-developed and well-nourished. No distress.  Eyes: EOM are normal. Pupils are equal, round, and reactive to light.  There is a pustular hordeolum to the external aspect of the lower lid of the left eye. There is minimal surrounding swelling. No erythema or cellulitis. Minor lower lid conjunctival erythema. The sclera is white and clear. Anterior chamber is clear. Normal pupillary responses.  Neck: Normal range of motion. Neck supple.  Cardiovascular: Normal rate.   Pulmonary/Chest: Effort normal.  Neurological: She is alert and oriented to person, place, and time.  Skin: Skin is warm and dry.  Nursing note and vitals reviewed.   ED Course  .Marland KitchenIncision and Drainage Date/Time: 06/18/2016 11:36 AM Performed by: Marcha Dutton, Yvana Samonte Authorized by: Melony Overly Consent: Verbal consent obtained. Risks and benefits: risks, benefits and alternatives were discussed Consent given by: patient Patient understanding: patient states understanding of the procedure being performed Patient identity confirmed: verbally with patient Type: cyst Body area: head Location details: left eyelid Anesthesia: local infiltration Local anesthetic: lidocaine 2% with epinephrine Anesthetic total: 0.3 ml Patient sedated: no Scalpel size: 11 Incision type: single straight Incision depth: dermal Complexity: simple Drainage: purulent Drainage amount: moderate Wound treatment: wound left open Patient tolerance: Patient tolerated the procedure well with no immediate complications Comments: Moderate amount of purulence draining from the hordeolum. There remains a hard-core.   (including critical care time)  Labs Review Labs Reviewed - No data to display  Imaging Review No results found.   Visual Acuity  Review  Right Eye Distance:   Left Eye Distance:   Bilateral Distance:    Right Eye Near:   Left Eye Near:    Bilateral Near:         MDM   1. Hordeolum external, left    Warm compresses frequently. Return for any problems or worsening or increased redness around the eye, more swelling or pain. Meds ordered this encounter  Medications  . erythromycin ophthalmic ointment    Sig: Place a 1/2 inch ribbon of ointment into the lower left  Eyelid qid.    Dispense:  1 g    Refill:  0    Order Specific Question:  Supervising Provider    Answer:  Melony Overly [id number]           Janne Napoleon, NP 06/18/16 1141

## 2016-06-18 NOTE — ED Notes (Addendum)
Here for stye to lower left eyelid onset x4 days  Sx today include: discharge, watery and pain (4/10) Has been applying warm compressions w/no relief.  A&O x4... NAD

## 2016-06-19 ENCOUNTER — Other Ambulatory Visit: Payer: Self-pay | Admitting: Gastroenterology

## 2016-06-19 ENCOUNTER — Encounter (HOSPITAL_COMMUNITY): Payer: Self-pay | Admitting: *Deleted

## 2016-06-19 ENCOUNTER — Ambulatory Visit (HOSPITAL_COMMUNITY): Payer: 59 | Admitting: Certified Registered Nurse Anesthetist

## 2016-06-19 ENCOUNTER — Ambulatory Visit (HOSPITAL_COMMUNITY)
Admission: RE | Admit: 2016-06-19 | Discharge: 2016-06-19 | Disposition: A | Discharge status: Home / Self Care | Payer: 59 | Source: Ambulatory Visit | Attending: Gastroenterology | Admitting: Gastroenterology

## 2016-06-19 ENCOUNTER — Encounter (HOSPITAL_COMMUNITY): Admission: RE | Payer: Self-pay | Source: Ambulatory Visit

## 2016-06-19 ENCOUNTER — Ambulatory Visit (HOSPITAL_COMMUNITY): Admission: RE | Admit: 2016-06-19 | Payer: 59 | Source: Ambulatory Visit | Admitting: Gastroenterology

## 2016-06-19 ENCOUNTER — Encounter (HOSPITAL_COMMUNITY)
Admission: RE | Disposition: A | Discharge status: Home / Self Care | Payer: Self-pay | Source: Ambulatory Visit | Attending: Gastroenterology

## 2016-06-19 DIAGNOSIS — K449 Diaphragmatic hernia without obstruction or gangrene: Secondary | ICD-10-CM | POA: Insufficient documentation

## 2016-06-19 DIAGNOSIS — Z800 Family history of malignant neoplasm of digestive organs: Secondary | ICD-10-CM | POA: Diagnosis not present

## 2016-06-19 DIAGNOSIS — K209 Esophagitis, unspecified: Secondary | ICD-10-CM | POA: Diagnosis not present

## 2016-06-19 DIAGNOSIS — Z9884 Bariatric surgery status: Secondary | ICD-10-CM | POA: Insufficient documentation

## 2016-06-19 DIAGNOSIS — Z923 Personal history of irradiation: Secondary | ICD-10-CM | POA: Diagnosis not present

## 2016-06-19 DIAGNOSIS — Z8572 Personal history of non-Hodgkin lymphomas: Secondary | ICD-10-CM | POA: Diagnosis not present

## 2016-06-19 DIAGNOSIS — K625 Hemorrhage of anus and rectum: Secondary | ICD-10-CM | POA: Diagnosis not present

## 2016-06-19 DIAGNOSIS — K319 Disease of stomach and duodenum, unspecified: Secondary | ICD-10-CM | POA: Diagnosis not present

## 2016-06-19 DIAGNOSIS — Z7982 Long term (current) use of aspirin: Secondary | ICD-10-CM | POA: Diagnosis not present

## 2016-06-19 DIAGNOSIS — E039 Hypothyroidism, unspecified: Secondary | ICD-10-CM | POA: Diagnosis not present

## 2016-06-19 DIAGNOSIS — K208 Other esophagitis: Secondary | ICD-10-CM | POA: Diagnosis not present

## 2016-06-19 DIAGNOSIS — K210 Gastro-esophageal reflux disease with esophagitis: Secondary | ICD-10-CM | POA: Insufficient documentation

## 2016-06-19 DIAGNOSIS — R112 Nausea with vomiting, unspecified: Secondary | ICD-10-CM | POA: Insufficient documentation

## 2016-06-19 DIAGNOSIS — Z85850 Personal history of malignant neoplasm of thyroid: Secondary | ICD-10-CM | POA: Diagnosis not present

## 2016-06-19 DIAGNOSIS — K5900 Constipation, unspecified: Secondary | ICD-10-CM | POA: Diagnosis not present

## 2016-06-19 HISTORY — PX: ESOPHAGOGASTRODUODENOSCOPY (EGD) WITH PROPOFOL: SHX5813

## 2016-06-19 HISTORY — DX: Constipation, unspecified: K59.00

## 2016-06-19 HISTORY — DX: Cystocele, unspecified: N81.10

## 2016-06-19 HISTORY — PX: COLONOSCOPY WITH PROPOFOL: SHX5780

## 2016-06-19 LAB — IGG, IGA, IGM
IGA/IMMUNOGLOBULIN A, SERUM: 128 mg/dL (ref 87–352)
IGG (IMMUNOGLOBIN G), SERUM: 834 mg/dL (ref 700–1600)
IgM, Qn, Serum: 8 mg/dL — ABNORMAL LOW (ref 26–217)

## 2016-06-19 SURGERY — ESOPHAGOGASTRODUODENOSCOPY (EGD) WITH PROPOFOL
Anesthesia: Monitor Anesthesia Care

## 2016-06-19 MED ORDER — LIDOCAINE HCL (CARDIAC) 20 MG/ML IV SOLN
INTRAVENOUS | Status: DC | PRN
  Administered 2016-06-19 (×2): 50 mg via INTRATRACHEAL

## 2016-06-19 MED ORDER — PROPOFOL 500 MG/50ML IV EMUL
INTRAVENOUS | Status: DC | PRN
  Administered 2016-06-19: 100 ug/kg/min via INTRAVENOUS

## 2016-06-19 MED ORDER — LACTATED RINGERS IV SOLN
INTRAVENOUS | Status: DC
  Administered 2016-06-19: 1000 mL via INTRAVENOUS
  Administered 2016-06-19: 10:00:00 via INTRAVENOUS

## 2016-06-19 MED ORDER — SODIUM CHLORIDE 0.9 % IV SOLN: INTRAVENOUS | Status: DC

## 2016-06-19 MED FILL — OMEPRAZOLE DR 20 MG CAPSULE: 20 | 90 days supply | Qty: 90 | Fill #0

## 2016-06-19 NOTE — Transfer of Care (Signed)
Immediate Anesthesia Transfer of Care Note  Patient: Maria Simmons  Procedure(s) Performed: Procedure(s): ESOPHAGOGASTRODUODENOSCOPY (EGD) WITH PROPOFOL (N/A) COLONOSCOPY WITH PROPOFOL (N/A)  Patient Location: PACU  Anesthesia Type:MAC  Level of Consciousness: awake, alert , oriented, patient cooperative and responds to stimulation  Airway & Oxygen Therapy: Patient Spontanous Breathing and Patient connected to nasal cannula oxygen  Post-op Assessment: Report given to RN and Post -op Vital signs reviewed and stable  Post vital signs: Reviewed and stable  Last Vitals:  Filed Vitals:   06/19/16 0837  BP: 145/67  Temp: 36.8 C  Resp: 20    Last Pain: There were no vitals filed for this visit.       Complications: No apparent anesthesia complications

## 2016-06-19 NOTE — Discharge Instructions (Signed)
Colonoscopy, Care After °These instructions give you information on caring for yourself after your procedure. Your doctor may also give you more specific instructions. Call your doctor if you have any problems or questions after your procedure. °HOME CARE °· Do not drive for 24 hours. °· Do not sign important papers or use machinery for 24 hours. °· You may shower. °· You may go back to your usual activities, but go slower for the first 24 hours. °· Take rest breaks often during the first 24 hours. °· Walk around or use warm packs on your belly (abdomen) if you have belly cramping or gas. °· Drink enough fluids to keep your pee (urine) clear or pale yellow. °· Resume your normal diet. Avoid heavy or fried foods. °· Avoid drinking alcohol for 24 hours or as told by your doctor. °· Only take medicines as told by your doctor. °If a tissue sample (biopsy) was taken during the procedure:  °· Do not take aspirin or blood thinners for 7 days, or as told by your doctor. °· Do not drink alcohol for 7 days, or as told by your doctor. °· Eat soft foods for the first 24 hours. °GET HELP IF: °You still have a small amount of blood in your poop (stool) 2-3 days after the procedure. °GET HELP RIGHT AWAY IF: °· You have more than a small amount of blood in your poop. °· You see clumps of tissue (blood clots) in your poop. °· Your belly is puffy (swollen). °· You feel sick to your stomach (nauseous) or throw up (vomit). °· You have a fever. °· You have belly pain that gets worse and medicine does not help. °MAKE SURE YOU: °· Understand these instructions. °· Will watch your condition. °· Will get help right away if you are not doing well or get worse. °  °This information is not intended to replace advice given to you by your health care provider. Make sure you discuss any questions you have with your health care provider. °  °Document Released: 01/02/2011 Document Revised: 12/05/2013 Document Reviewed: 08/07/2013 °Elsevier  Interactive Patient Education ©2016 [employer] °Esophagogastroduodenoscopy, Care After °Refer to this sheet in the next few weeks. These instructions provide you with information about caring for yourself after your procedure. Your health care provider may also give you more specific instructions. Your treatment has been planned according to current medical practices, but problems sometimes occur. Call your health care provider if you have any problems or questions after your procedure. °WHAT TO EXPECT AFTER THE PROCEDURE °After your procedure, it is typical to feel: °· Soreness in your throat. °· Pain with swallowing. °· Sick to your stomach (nauseous). °· Bloated. °· Dizzy. °· Fatigued. °HOME CARE INSTRUCTIONS °· Do not eat or drink anything until the numbing medicine (local anesthetic) has worn off and your gag reflex has returned. You will know that the local anesthetic has worn off when you can swallow comfortably. °· Do not drive or operate machinery until directed by your health care provider. °· Take medicines only as directed by your health care provider. °SEEK MEDICAL CARE IF:  °· You cannot stop coughing. °· You are not urinating at all or less than usual. °SEEK IMMEDIATE MEDICAL CARE IF: °· You have difficulty swallowing. °· You cannot eat or drink. °· You have worsening throat or chest pain. °· You have dizziness or lightheadedness or you faint. °· You have nausea or vomiting. °· You have chills. °· You have a fever. °·   You have severe abdominal pain. °· You have black, tarry, or bloody stools. °  °This information is not intended to replace advice given to you by your health care provider. Make sure you discuss any questions you have with your health care provider. °  °Document Released: 11/16/2012 Document Revised: 12/21/2014 Document Reviewed: 11/16/2012 °Elsevier Interactive Patient Education ©2016 Elsevier Inc. ° °

## 2016-06-19 NOTE — H&P (Signed)
Maria Simmons is an 50 y.o. female.   Chief Complaint:  Nausea and vomiting, rectal bleeding, family history of colon cancer. HPI: h/o remote lap band surgery, with sporadic n/v despite lap band being loosened.  Fam hx of colon cancer in sister at young age, also some rectal bleeding, last colonoscopy 8/07.  Past Medical History  Diagnosis Date  . Hypercholesterolemia   . Thyroid cancer ([place])   . Hypothyroidism   . Wears glasses   . DLBCL (diffuse large B cell lymphoma) ([place])   . Neutropenic fever ([place]) 11/01/2014  . Cancer [facility]) 2007    thyroid, papillary  . S/P radiation therapy 02/05/15-02/27/15    tonsil/neck,lt subpectoral nodal bed 30.6Gy /17 fx  . Arthritis of shoulder region, right, degenerative     right shoulder   . History of B-cell lymphoma 11/18/2015  . Change in bowel habits 03/19/2016  . Kidney stones   . Constipation   . Bladder prolapse, female, acquired   . PONV (postoperative nausea and vomiting)     during hysterectomy     Past Surgical History  Procedure Laterality Date  . 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  . Tubal ligation  1990  . Abdominal hysterectomy  2000  . Colonoscopy    . Lymph node biopsy Left 10/12/2014    Procedure: excisional biopsy deep left neck lymph node;  Surgeon: Fanny Skates, MD;  Location: [place];  Service: General;  Laterality: Left;  . Laparoscopic gastric banding with hiatal hernia repair  05/28/2009  . Portacath placement Right 10/24/2014    Procedure: INSERTION PORT-A-CATH WITH ULTRASOUND GUIDANCE;  Surgeon: Fanny Skates, MD;  Location: [place];  Service: General;  Laterality: Right;  . Port-a-cath removal N/A 10/18/2015    Procedure: REMOVAL PORT-A-CATH;  Surgeon: Fanny Skates, MD;  [facility];  Service: General;  Laterality: N/A;    Family History  Problem Relation Age of Onset  . Colon cancer Mother   . Cancer Mother      colon ca  . Cancer Father     prostate  . Hypertension Father   . Hyperlipidemia Father   . Stroke Father   . Prostate cancer Father   . Cancer Paternal Grandmother     female organ ca   Social History:  reports that she has never smoked. She has never used smokeless tobacco. She reports that she does not drink alcohol or use illicit drugs.  Allergies:  Allergies  Allergen Reactions  . Metformin And Related Rash    Fingers turned  Blue.  . Tape Itching    Surgical tape.  . Levaquin [Levofloxacin] Rash  . Penicillins Rash    Has patient had a PCN reaction causing immediate rash, facial/tongue/throat swelling, SOB or lightheadedness with hypotension: No Has patient had a PCN reaction causing severe rash involving mucus membranes or skin necrosis: No Has patient had a PCN reaction that required hospitalization No Has patient had a PCN reaction occurring within the last 10 years: No If all of the above answers are "NO", then may proceed with Cephalosporin use.     Medications Prior to Admission  Medication Sig Dispense Refill  . aspirin EC 81 MG tablet Take 81 mg by mouth every morning.     . Biotin 5000 MCG TABS Take 1 tablet by mouth every morning. Reported on 02/18/2016    . cyanocobalamin 500 MCG tablet Take 500 mcg by mouth daily.    [name]  Multiple Vitamin (MULITIVITAMIN WITH MINERALS) TABS Take 1 tablet by mouth every morning.     . polyethylene glycol (MIRALAX / GLYCOLAX) packet Take 17 g by mouth daily as needed.    [name] SYNTHROID 112 MCG tablet Take 100 mcg by mouth daily.   12  . Vitamin D, Cholecalciferol, 1000 UNITS TABS Take 1,000 Units by mouth daily at 12 noon.     [name] erythromycin ophthalmic ointment Place a 1/2 inch ribbon of ointment into the lower left  Eyelid qid. 1 g 0    Results for orders placed or performed in visit on 06/18/16 (from the past 48 hour(s))  CBC with Differential/Platelet     Status: Abnormal   Collection Time: 06/18/16  8:28 AM  Result Value  Ref Range   WBC 2.2 (L) 3.9 - 10.3 10e3/uL   NEUT# 1.0 (L) 1.5 - 6.5 10e3/uL   HGB 12.3 11.6 - 15.9 g/dL   HCT 36.8 34.8 - 46.6 %   Platelets 265 145 - 400 10e3/uL   MCV 85.6 79.5 - 101.0 fL   MCH 28.6 25.1 - 34.0 pg   MCHC 33.4 31.5 - 36.0 g/dL   RBC 4.30 3.70 - 5.45 10e6/uL   RDW 13.4 11.2 - 14.5 %   lymph# 0.7 (L) 0.9 - 3.3 10e3/uL   MONO# 0.4 0.1 - 0.9 10e3/uL   Eosinophils Absolute 0.1 0.0 - 0.5 10e3/uL   Basophils Absolute 0.0 0.0 - 0.1 10e3/uL   NEUT% 45.5 38.4 - 76.8 %   LYMPH% 32.7 14.0 - 49.7 %   MONO% 16.8 (H) 0.0 - 14.0 %   EOS% 4.5 0.0 - 7.0 %   BASO% 0.5 0.0 - 2.0 %  IgG, IgA, IgM     Status: Abnormal   Collection Time: 06/18/16  8:28 AM  Result Value Ref Range   IgG, Qn, Serum 834 700 - 1600 mg/dL   IgA, Qn, Serum 128 87 - 352 mg/dL   IgM, Qn, Serum 8 (L) 26 - 217 mg/dL    Comment: Result confirmed on concentration.  Comprehensive metabolic panel     Status: Abnormal   Collection Time: 06/18/16  8:28 AM  Result Value Ref Range   Sodium 140 136 - 145 mEq/L   Potassium 3.8 3.5 - 5.1 mEq/L   Chloride 104 98 - 109 mEq/L   CO2 27 22 - 29 mEq/L   Glucose 105 70 - 140 mg/dl    Comment: Glucose reference range is for nonfasting patients. Fasting glucose reference range is 70- 100.   BUN 14.0 7.0 - 26.0 mg/dL   Creatinine 0.9 0.6 - 1.1 mg/dL   Total Bilirubin 0.36 0.20 - 1.20 mg/dL   Alkaline Phosphatase 72 40 - 150 U/L   AST 14 5 - 34 U/L   ALT 10 0 - 55 U/L   Total Protein 6.7 6.4 - 8.3 g/dL   Albumin 3.5 3.5 - 5.0 g/dL   Calcium 8.8 8.4 - 10.4 mg/dL   Anion Gap 9 3 - 11 mEq/L   EGFR 89 (L) >90 ml/min/1.73 m2    Comment: eGFR is calculated using the CKD-EPI Creatinine Equation (2009)   No results found.  ROS see HPI  Blood pressure 145/67, temperature 98.3 F (36.8 C), temperature source Oral, resp. rate 20, height _0  (1.626 m), weight 68.04 kg (150 lb), last menstrual period 04/13/2000, SpO2 99 %.   Physical Exam NAD.  Alert.  Chest clr.  Heart  w/out g/r/m.  Abd nondistended, nontender.  No focal neuro deficits.  Mood nl.  Assessment/Plan]  Nausea/vomiting--will do egd (n/p/r reviewed) Rect bld, fam hx colon cancer--will do colonoscopy (pt familiar)  Cleotis Nipper, MD 06/19/2016, 10:01 AM

## 2016-06-19 NOTE — Op Note (Signed)
[facility] Patient Name: Maria Simmons Procedure Date : 06/19/2016 MRN: [id number] Attending MD: Ronald Lobo , MD Date of Birth: 31-Oct-1966 CSN: [id number] Age: 50 Admit Type: Outpatient Procedure:                Upper GI endoscopy Indications:              Nausea with vomiting Providers:                Ronald Lobo, MD, Cleda Daub, RN, William Dalton, Technician Referring MD:              Medicines:                Monitored Anesthesia Care Complications:            No immediate complications. Estimated Blood Loss:     Estimated blood loss was minimal. Procedure:                Pre-Anesthesia Assessment:                           - Prior to the procedure, a History and Physical                            was performed, and patient medications and                            allergies were reviewed. The patient's tolerance of                            previous anesthesia was also reviewed. The risks                            and benefits of the procedure and the sedation                            options and risks were discussed with the patient.                            All questions were answered, and informed consent                            was obtained. Prior Anticoagulants: The patient has                            taken aspirin, last dose was 7 days prior to                            procedure. ASA Grade Assessment: II - A patient                            with mild systemic disease. After reviewing the  risks and benefits, the patient was deemed in                            satisfactory condition to undergo the procedure.                           After obtaining informed consent, the endoscope was                            passed under direct vision. Throughout the                            procedure, the patient's blood pressure, pulse, and                            oxygen saturations  were monitored continuously. The                            [id number]) scope was introduced through the                            mouth, and advanced to the second part of duodenum.                            The upper GI endoscopy was accomplished without                            difficulty. The patient tolerated the procedure                            well. Scope In: Scope Out: Findings:      Mild distal non-erosive esophagitis was found, with salmon-colored       mucosa possibly representing Barrett's. Biopsies were taken with a cold       forceps for histology. Estimated blood loss was minimal.      A 3 cm hiatal hernia was present.      The exam of the esophagus was otherwise normal.      A single localized, 3 mm bleeding erosion with mucosal hemorrhage was       found in the gastric fundus and on the greater curvature of the stomach.       This might have been due to scope trauma, in which case there is       probably some element of mucosal friability.      No other significant abnormalities were identified in a careful       examination of the stomach. Although the patient has had lap-band       surgery, the patient indicates the lap band has been relaxed, and there       is currently no endoscopic evidence of deformity or impingement on the       gastric wall from the band.      The cardia and gastric fundus were normal on retroflexion apart from the       above-mentioned erosion.      The examined duodenum was normal. Impression:               -  Mild reflux esophagitis. Rule out Barrett's                            esophagus. Biopsied.                           - 3 cm hiatal hernia.                           - Erosive gastropathy in proximal stomach with                            mucosal hemorrhage, ?due to scope trauma. No                            findings of classic aspirin gastropathy seen.                           - Normal examined duodenum. Moderate  Sedation:      This patient was sedated with monitored anesthesia care, not moderate       sedation. Recommendation:           - Continue present medications.                           - Await pathology results.                           - Use Prilosec (omeprazole) 20 mg PO daily                            indefinitely.                           - Resume previous diet. Procedure Code(s):        --- Professional ---                           [phone], Esophagogastroduodenoscopy, flexible,                            transoral; with biopsy, single or multiple Diagnosis Code(s):        --- Professional ---                           K21.0, Gastro-esophageal reflux disease with                            esophagitis                           R11.2, Nausea with vomiting, unspecified                           K31.89, Other diseases of stomach and duodenum CPT copyright 2016 [employer]. All rights reserved. The codes documented in this report are preliminary and upon coder review may  be revised to meet current compliance requirements. Ronald Lobo, MD  06/19/2016 11:06:14 AM This report has been signed electronically. Number of Addenda: 0

## 2016-06-19 NOTE — Anesthesia Preprocedure Evaluation (Addendum)
Anesthesia Evaluation  Patient identified by MRN, date of birth, ID band Patient awake    Reviewed: Allergy & Precautions, NPO status , Patient's Chart, lab work & pertinent test results  History of Anesthesia Complications (+) PONV and history of anesthetic complications  Airway Mallampati: II  TM Distance: >3 FB Neck ROM: Full    Dental no notable dental hx.    Pulmonary neg pulmonary ROS,    Pulmonary exam normal breath sounds clear to auscultation       Cardiovascular negative cardio ROS Normal cardiovascular exam Rhythm:Regular Rate:Normal     Neuro/Psych negative neurological ROS  negative psych ROS   GI/Hepatic negative GI ROS, Neg liver ROS,   Endo/Other  negative endocrine ROSHypothyroidism   Renal/GU Renal diseasenegative Renal ROS     Musculoskeletal  (+) Arthritis ,   Abdominal   Peds  Hematology negative hematology ROS (+) anemia ,   Anesthesia Other Findings   Reproductive/Obstetrics negative OB ROS                             Anesthesia Physical Anesthesia Plan  ASA: II  Anesthesia Plan: MAC   Post-op Pain Management:    Induction: Intravenous  Airway Management Planned:   Additional Equipment:   Intra-op Plan:   Post-operative Plan:   Informed Consent: I have reviewed the patients History and Physical, chart, labs and discussed the procedure including the risks, benefits and alternatives for the proposed anesthesia with the patient or authorized representative who has indicated his/her understanding and acceptance.   Dental advisory given  Plan Discussed with: CRNA  Anesthesia Plan Comments:         Anesthesia Quick Evaluation                                   Anesthesia Evaluation  Patient identified by MRN, date of birth, ID band Patient awake    Reviewed: Allergy & Precautions, NPO status , Patient's Chart, lab work & pertinent test  results  History of Anesthesia Complications (+) PONV  Airway Mallampati: II  TM Distance: >3 FB Neck ROM: Full    Dental   Pulmonary neg pulmonary ROS,    breath sounds clear to auscultation       Cardiovascular negative cardio ROS   Rhythm:Regular Rate:Normal     Neuro/Psych    GI/Hepatic negative GI ROS, Neg liver ROS,   Endo/Other  Hypothyroidism   Renal/GU negative Renal ROS     Musculoskeletal  (+) Arthritis ,   Abdominal   Peds  Hematology   Anesthesia Other Findings   Reproductive/Obstetrics                            Anesthesia Physical Anesthesia Plan  ASA: II  Anesthesia Plan: General   Post-op Pain Management:    Induction: Intravenous  Airway Management Planned: LMA  Additional Equipment:   Intra-op Plan:   Post-operative Plan: Extubation in OR  Informed Consent: I have reviewed the patients History and Physical, chart, labs and discussed the procedure including the risks, benefits and alternatives for the proposed anesthesia with the patient or authorized representative who has indicated his/her understanding and acceptance.   Dental advisory given  Plan Discussed with: CRNA and Anesthesiologist  Anesthesia Plan Comments:        Anesthesia Quick Evaluation

## 2016-06-19 NOTE — Op Note (Signed)
[facility] [place] Patient Name: Maria Simmons Procedure Date : 06/19/2016 MRN: [id number] Attending MD: Ronald Lobo , MD Date of Birth: [birth year] CSN: [id number] Age: 50 Admit Type: Outpatient Procedure:                Colonoscopy Indications:              Rectal bleeding; also, family history of colon                            cancer in mother at early age, last colonoscopy                            August 2007. Providers:                Ronald Lobo, MD, Cleda Daub, RN, William Dalton, Technician Referring MD:              Medicines:                Monitored Anesthesia Care Complications:            No immediate complications. Estimated Blood Loss:     Estimated blood loss: none. Procedure:                Pre-Anesthesia Assessment:                           - Prior to the procedure, a History and Physical                            was performed, and patient medications and                            allergies were reviewed. The patient's tolerance of                            previous anesthesia was also reviewed. The risks                            and benefits of the procedure and the sedation                            options and risks were discussed with the patient.                            All questions were answered, and informed consent                            was obtained. Prior Anticoagulants: The patient has                            taken aspirin, last dose was 7 days prior to  procedure. ASA Grade Assessment: II - A patient                            with mild systemic disease. After reviewing the                            risks and benefits, the patient was deemed in                            satisfactory condition to undergo the procedure.                           - Prior to the procedure, a History and Physical                            was performed, and patient medications and                             allergies were reviewed. The patient's tolerance of                            previous anesthesia was also reviewed. The risks                            and benefits of the procedure and the sedation                            options and risks were discussed with the patient.                            All questions were answered, and informed consent                            was obtained. Prior Anticoagulants: The patient has                            taken aspirin, last dose was 7 days prior to                            procedure. ASA Grade Assessment: II - A patient                            with mild systemic disease. After reviewing the                            risks and benefits, the patient was deemed in                            satisfactory condition to undergo the procedure.                           After obtaining informed consent, the colonoscope  was passed under direct vision. Throughout the                            procedure, the patient's blood pressure, pulse, and                            oxygen saturations were monitored continuously. The                            [id number] scope was introduced through                            the anus and advanced to the the terminal ileum.                            The colonoscopy was technically difficult and                            complex due to a tortuous colon. Successful                            completion of the procedure was aided by using                            manual pressure and straightening and shortening                            the scope to obtain bowel loop reduction. The                            patient tolerated the procedure well. The quality                            of the bowel preparation was adequate. The terminal                            ileum, ileocecal valve, appendiceal orifice, and                             rectum were photographed. Scope In: 10:23:32 AM Scope Out: 10:52:31 AM Scope Withdrawal Time: 0 hours 15 minutes 11 seconds  Total Procedure Duration: 0 hours 28 minutes 59 seconds  Findings:      The perianal examination was normal.      The colon (entire examined portion) appeared normal.      There is no endoscopic evidence of diverticula, inflammation, mass,       polyps or hemorrhoids in the entire colon.      The retroflexed view of the distal rectum and anal verge was normal and       showed no anal or rectal abnormalities.      The terminal ileum appeared normal. Impression:               - The entire examined colon is normal. No source of  rectal bleeding seen.                           - The distal rectum and anal verge are normal on                            retroflexion view.                           - The examined portion of the ileum was normal.                           - No specimens collected. Moderate Sedation:      This patient was sedated with monitored anesthesia care, not moderate       sedation. Recommendation:           - Repeat colonoscopy in 5 years for screening                            purposes in view of family history.                           - Return to my office in 3 weeks.                           - Resume previous diet.                           - Continue present medications. Procedure Code(s):        --- Professional ---                           [phone], Colonoscopy, flexible; diagnostic, including                            collection of specimen(s) by brushing or washing,                            when performed (separate procedure) Diagnosis Code(s):        --- Professional ---                           K62.5, Hemorrhage of anus and rectum CPT copyright 2016 [employer]. All rights reserved. The codes documented in this report are preliminary and upon coder review may  be revised to meet  current compliance requirements. Ronald Lobo, MD 06/19/2016 11:16:58 AM This report has been signed electronically. Number of Addenda: 0

## 2016-06-19 NOTE — Anesthesia Postprocedure Evaluation (Signed)
Anesthesia Post Note  Patient: YETIVE SANTILLANA  Procedure(s) Performed: Procedure(s) (LRB): ESOPHAGOGASTRODUODENOSCOPY (EGD) WITH PROPOFOL (N/A) COLONOSCOPY WITH PROPOFOL (N/A)  Patient location during evaluation: Endoscopy Anesthesia Type: MAC Level of consciousness: responds to stimulation and patient cooperative Pain management: pain level controlled Vital Signs Assessment: post-procedure vital signs reviewed and stable Respiratory status: spontaneous breathing and respiratory function stable Cardiovascular status: blood pressure returned to baseline Postop Assessment: no headache Anesthetic complications: no    Last Vitals:  Filed Vitals:   06/19/16 0837 06/19/16 1058  BP: 145/67 170/146  Pulse:  75  Temp: 36.8 C   Resp: 20 25    Last Pain: There were no vitals filed for this visit.               Judeth Cornfield T

## 2016-06-25 ENCOUNTER — Encounter: Payer: Self-pay | Admitting: Hematology and Oncology

## 2016-06-25 ENCOUNTER — Ambulatory Visit (HOSPITAL_BASED_OUTPATIENT_CLINIC_OR_DEPARTMENT_OTHER): Payer: 59 | Admitting: Hematology and Oncology

## 2016-06-25 VITALS — BP 116/76 | HR 96 | Temp 98.4°F | Resp 18 | Wt 155.0 lb

## 2016-06-25 DIAGNOSIS — D72819 Decreased white blood cell count, unspecified: Secondary | ICD-10-CM

## 2016-06-25 DIAGNOSIS — Z8579 Personal history of other malignant neoplasms of lymphoid, hematopoietic and related tissues: Secondary | ICD-10-CM | POA: Diagnosis not present

## 2016-06-25 DIAGNOSIS — H00019 Hordeolum externum unspecified eye, unspecified eyelid: Secondary | ICD-10-CM | POA: Insufficient documentation

## 2016-06-25 DIAGNOSIS — Z85850 Personal history of malignant neoplasm of thyroid: Secondary | ICD-10-CM | POA: Diagnosis not present

## 2016-06-25 DIAGNOSIS — K209 Esophagitis, unspecified without bleeding: Secondary | ICD-10-CM | POA: Insufficient documentation

## 2016-06-25 DIAGNOSIS — H00016 Hordeolum externum left eye, unspecified eyelid: Secondary | ICD-10-CM

## 2016-06-25 NOTE — Progress Notes (Signed)
[facility] OFFICE PROGRESS NOTE  Patient Care Team: Jacelyn Pi, MD as PCP - General (Endocrinology) Megan Salon, MD as Consulting Physician (Gynecology) Heath Lark, MD as Consulting Physician (Hematology and Oncology) Leota Sauers, RN as Oncology Nurse Navigator Eppie Gibson, MD as Attending Physician (Radiation Oncology) Frederik Pear, MD as Consulting Physician (Orthopedic Surgery) Fanny Skates, MD as Consulting Physician (General Surgery)  SUMMARY OF ONCOLOGIC HISTORY: Oncology History   DLBCL (diffuse large B cell lymphoma)   Staging form: Lymphoid Neoplasms, AJCC 6th Edition     Clinical stage from 11/15/2014: Stage II - Signed by Heath Lark, MD on 11/15/2014       History of lymphoma   09/14/2014 Imaging PET/CT scan showed Hypermetabolic bilateral cervical lymphadenopathy, left side greater than right, and sub-cm hypermetabolic left subpectoral lymph node. Hypermetabolic activity throughout the lingular and palatine tonsils in Waldeyer's ring   10/12/2014 Procedure Biopsy of the left neck lymph node came back positive for diffuse large B-cell lymphoma.Accession: [id number]   10/24/2014 Procedure PAC placed.   10/24/2014 Bone Marrow Biopsy Bone marrow biopsy was negative.Cytogenetics were normal   10/24/2014 Imaging Echocardiogram showed normal ejection fraction   10/25/2014 - 12/06/2014 Chemotherapy She received 3 cyles of R CHOP chemotherapy. Further doses of R CHOP chemotherapy was reduced due to recent neutropenic fever with cycle 1 of treatment.   11/01/2014 Adverse Reaction She was seen in the emergency Department for possible neutropenic fever. Cultures were negative. She received antibiotic therapy.   11/15/2014 - 12/06/2014 Chemotherapy Intrathecal chemotherapy is added due to risk of CNS relapse   01/04/2015 Imaging PET CT scan show complete remission from lymphadenopathy. Incidental finding of right shoulder arthropathy   01/16/2015 Imaging MRI of the  right shoulder show no evidence of cancer, with evidence of tendon tear and possible changes related to trauma   02/05/2015 - 02/27/2015 Radiation Therapy IMRT to tonsils, neck and L subpectoral nodal bed:  30.6 Gy in 17 fractions.   04/25/2015 Imaging CT neck, chest, abdomen/pelvis:  No findings for active lymphomatous involvement.   07/16/2015 Imaging PET/ CT scans show no evidence of lymphoma. It showed persistent abnormality in the right humeral area   10/18/2015 Procedure PAC removed.   03/26/2016 Imaging CT scan of chest, abdomen and pelvis showed no evidence of lymphoma. Noted changes suspicious of infection   03/26/2016 Imaging CT neck showed asymmetry of the lower palatine tonsils with slight fullness on the left and underfilling of air column at this level.    INTERVAL HISTORY: Please see below for problem oriented charting. She returns for further follow-up. She had recent brief hemoptysis/hematemesis. She had EGD and colonoscopy on 06/19/2016 which showed non-erosive esophagitis. She was on proton pump inhibitor. Biopsy was negative for malignancy She has recent left eye stye, treated with topical antibiotic drops. It is improving. She denies visual changes. No new lymphadenopathy She denies recent other infections She has appointment pending to see her endocrinologist.  REVIEW OF SYSTEMS:   Constitutional: Denies fevers, chills or abnormal weight loss Eyes: Denies blurriness of vision Ears, nose, mouth, throat, and face: Denies mucositis or sore throat Respiratory: Denies cough, dyspnea or wheezes Cardiovascular: Denies palpitation, chest discomfort or lower extremity swelling Gastrointestinal:  Denies nausea, heartburn or change in bowel habits Skin: Denies abnormal skin rashes Lymphatics: Denies new lymphadenopathy or easy bruising Neurological:Denies numbness, tingling or new weaknesses Behavioral/Psych: Mood is stable, no new changes  All other systems were reviewed with the  patient and are negative.  I have reviewed the past medical history, past surgical history, social history and family history with the patient and they are unchanged from previous note.  ALLERGIES:  is allergic to metformin and related; tape; levaquin; and penicillins.  MEDICATIONS:  Current Outpatient Prescriptions  Medication Sig Dispense Refill  . aspirin EC 81 MG tablet Take 81 mg by mouth every morning.     . Biotin 5000 MCG TABS Take 1 tablet by mouth every morning. Reported on 02/18/2016    . cyanocobalamin 500 MCG tablet Take 500 mcg by mouth daily.    [name] erythromycin ophthalmic ointment Place a 1/2 inch ribbon of ointment into the lower left  Eyelid qid. 1 g 0  . Multiple Vitamin (MULITIVITAMIN WITH MINERALS) TABS Take 1 tablet by mouth every morning.     . polyethylene glycol (MIRALAX / GLYCOLAX) packet Take 17 g by mouth daily as needed.    [name] SYNTHROID 112 MCG tablet Take 100 mcg by mouth daily.   12  . Vitamin D, Cholecalciferol, 1000 UNITS TABS Take 1,000 Units by mouth daily at 12 noon.     . [DISCONTINUED] calcium-vitamin D (OSCAL WITH D) 500-200 MG-UNIT per tablet Take 1 tablet by mouth daily.     No current facility-administered medications for this visit.    PHYSICAL EXAMINATION: ECOG PERFORMANCE STATUS: 0 - Asymptomatic  Filed Vitals:   06/25/16 1536  BP: 116/76  Pulse: 96  Temp: 98.4 F (36.9 C)  Resp: 18   Filed Weights   06/25/16 1536  Weight: 155 lb (70.308 kg)    GENERAL:alert, no distress and comfortable SKIN: skin color, texture, turgor are normal, no rashes or significant lesions EYES: Noted left eye stye.  OROPHARYNX:no exudate, no erythema and lips, buccal mucosa, and tongue normal  NECK: supple, thyroid normal size, non-tender, without nodularity LYMPH:  no palpable lymphadenopathy in the cervical, axillary or inguinal LUNGS: clear to auscultation and percussion with normal breathing effort HEART: regular rate & rhythm and no murmurs and no  lower extremity edema ABDOMEN:abdomen soft, non-tender and normal bowel sounds Musculoskeletal:no cyanosis of digits and no clubbing  NEURO: alert & oriented x 3 with fluent speech, no focal motor/sensory deficits  LABORATORY DATA:  I have reviewed the data as listed    Component Value Date/Time   NA 140 06/18/2016 0828   NA 140 11/01/2014 1424   K 3.8 06/18/2016 0828   K 4.2 11/01/2014 1424   CL 99 11/01/2014 1424   CL 102 08/31/2012 1336   CO2 27 06/18/2016 0828   CO2 26 11/01/2014 1424   GLUCOSE 105 06/18/2016 0828   GLUCOSE 101* 11/01/2014 1424   GLUCOSE 95 08/31/2012 1336   BUN 14.0 06/18/2016 0828   BUN 9 11/01/2014 1424   CREATININE 0.9 06/18/2016 0828   CREATININE 0.63 11/01/2014 1424   CALCIUM 8.8 06/18/2016 0828   CALCIUM 9.0 11/01/2014 1424   PROT 6.7 06/18/2016 0828   PROT 6.7 11/01/2014 1424   ALBUMIN 3.5 06/18/2016 0828   ALBUMIN 3.5 11/01/2014 1424   AST 14 06/18/2016 0828   AST 9 11/01/2014 1424   ALT 10 06/18/2016 0828   ALT 9 11/01/2014 1424   ALKPHOS 72 06/18/2016 0828   ALKPHOS 86 11/01/2014 1424   BILITOT 0.36 06/18/2016 0828   BILITOT 0.6 11/01/2014 1424   GFRNONAA >90 11/01/2014 1424   GFRAA >90 11/01/2014 1424    No results found for: SPEP, UPEP  Lab Results  Component Value Date   WBC 2.2* 06/18/2016  NEUTROABS 1.0* 06/18/2016   HGB 12.3 06/18/2016   HCT 36.8 06/18/2016   MCV 85.6 06/18/2016   PLT 265 06/18/2016      Chemistry      Component Value Date/Time   NA 140 06/18/2016 0828   NA 140 11/01/2014 1424   K 3.8 06/18/2016 0828   K 4.2 11/01/2014 1424   CL 99 11/01/2014 1424   CL 102 08/31/2012 1336   CO2 27 06/18/2016 0828   CO2 26 11/01/2014 1424   BUN 14.0 06/18/2016 0828   BUN 9 11/01/2014 1424   CREATININE 0.9 06/18/2016 0828   CREATININE 0.63 11/01/2014 1424      Component Value Date/Time   CALCIUM 8.8 06/18/2016 0828   CALCIUM 9.0 11/01/2014 1424   ALKPHOS 72 06/18/2016 0828   ALKPHOS 86 11/01/2014 1424    AST 14 06/18/2016 0828   AST 9 11/01/2014 1424   ALT 10 06/18/2016 0828   ALT 9 11/01/2014 1424   BILITOT 0.36 06/18/2016 0828   BILITOT 0.6 11/01/2014 1424     I reviewed her EGD, colonoscopy and biopsy report ASSESSMENT & PLAN:  History of lymphoma Her recent CT scan showed no evidence of lymphoma. I plan to see her back in 6 months for further assessment  Hx of papillary thyroid carcinoma She is on chronic suppressive therapy with thyroid medicine. 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 She has been asymptomatic for the last 3 months. Monitor only.   Esophagitis She has intermittent hemoptysis. Recent EGD show evidence of distal nonerosive esophagitis. Biopsy was negative for malignancy. Recommend she continue on proton pump inhibitor  Stye This is improving on topical antibiotic eyedrops. Continue to same.   Orders Placed This Encounter  Procedures  . CBC with Differential/Platelet    Standing Status: Future     Number of Occurrences:      Standing Expiration Date: 07/30/2017  . Comprehensive metabolic panel    Standing Status: Future     Number of Occurrences:      Standing Expiration Date: 07/30/2017  . Lactate dehydrogenase (LDH)    Standing Status: Future     Number of Occurrences:      Standing Expiration Date: 07/30/2017   All questions were answered. The patient knows to call the clinic with any problems, questions or concerns. No barriers to learning was detected. I spent 15 minutes counseling the patient face to face. The total time spent in the appointment was 20 minutes and more than 50% was on counseling and review of test results     [facility], [place], MD 06/25/2016 3:49 PM

## 2016-06-25 NOTE — Assessment & Plan Note (Signed)
She is on chronic suppressive therapy with thyroid medicine. I will defer to his endocrinologist for further management

## 2016-06-25 NOTE — Assessment & Plan Note (Signed)
She has intermittent hemoptysis. Recent EGD show evidence of distal nonerosive esophagitis. Biopsy was negative for malignancy. Recommend she continue on proton pump inhibitor

## 2016-06-25 NOTE — Assessment & Plan Note (Signed)
This is improving on topical antibiotic eyedrops. Continue to same.

## 2016-06-25 NOTE — Assessment & Plan Note (Signed)
She has chronic neutropenia since treatment. She has been asymptomatic for the last 3 months. Monitor only.

## 2016-06-25 NOTE — Assessment & Plan Note (Signed)
Her recent CT scan showed no evidence of lymphoma. I plan to see her back in 6 months for further assessment

## 2016-06-30 ENCOUNTER — Telehealth: Payer: Self-pay | Admitting: Hematology and Oncology

## 2016-06-30 DIAGNOSIS — E890 Postprocedural hypothyroidism: Secondary | ICD-10-CM | POA: Diagnosis not present

## 2016-06-30 NOTE — Telephone Encounter (Signed)
Called patient to confirm 12/25/16 appointment. Left voice message. Appointment letter & schedule mailed. Merleen Nicely.

## 2016-07-06 DIAGNOSIS — I10 Essential (primary) hypertension: Secondary | ICD-10-CM | POA: Diagnosis not present

## 2016-07-06 DIAGNOSIS — C73 Malignant neoplasm of thyroid gland: Secondary | ICD-10-CM | POA: Diagnosis not present

## 2016-07-06 DIAGNOSIS — E890 Postprocedural hypothyroidism: Secondary | ICD-10-CM | POA: Diagnosis not present

## 2016-07-06 DIAGNOSIS — E7800 Pure hypercholesterolemia, unspecified: Secondary | ICD-10-CM | POA: Diagnosis not present

## 2016-07-06 DIAGNOSIS — E1165 Type 2 diabetes mellitus with hyperglycemia: Secondary | ICD-10-CM | POA: Diagnosis not present

## 2016-07-06 MED FILL — SYNTHROID 112 MCG TABLET: 112 | 90 days supply | Qty: 90 | Fill #0

## 2016-07-11 IMAGING — CT NM PET TUM IMG RESTAG (PS) SKULL BASE T - THIGH
1 of 7 series · 1 of 25 positions shown · non-contrast
Comparison: None.

CLINICAL DATA: Subsequent treatment strategy for papillary thyroid
carcinoma.

EXAM:
NUCLEAR MEDICINE PET SKULL BASE TO THIGH
TECHNIQUE: 9.7 mCi F-18 FDG was injected intravenously. Full-ring PET imaging
was performed from the skull base to thigh after the radiotracer. CT
data was obtained and used for attenuation correction and anatomic
localization.
FASTING BLOOD GLUCOSE:  Value: 102 mg/dl

[Series 3: pet hn_sk_thigh ac · axial · 5.0mm · 4.07mm/px · 1 of 221 slices shown]
[im 111/221]
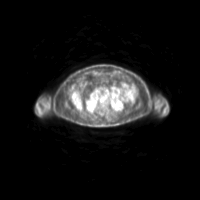

[1 of 25 positions shown; findings below may reference images not displayed]

FINDINGS: NECK

Left upper internal jugular chain level-II lymphadenopathy is seen,
with largest node measuring 1.9 cm on image 37 of series 4. This is
hypermetabolic with SUV max of 32.9. Other tiny less than 1 cm
hypermetabolic lymph lymph node is seen in the high left posterior
triangle level 5A.

In the right neck, there is a tiny less than 1 cm hypermetabolic
level 2 jugular node on image 34 of series 4 which has an SUV max of
3.2.

Mild hypermetabolic sub-cm lymph nodes are also seen in the
supraclavicular regions bilaterally.

In addition, there is prominent hypermetabolic activity throughout
the lingular and palatine tonsils in Waldeyer's ring.

CHEST

No hypermetabolic mediastinal or hilar nodes. A mildly
hypermetabolic sub-cm left subpectoral lymph node is seen with SUV
max of 1.9. No suspicious pulmonary nodules on the CT scan.

ABDOMEN/PELVIS

No abnormal hypermetabolic activity within the liver, pancreas,
adrenal glands, or spleen. No hypermetabolic lymph nodes in the
abdomen or pelvis. Gastric lap band noted.

SKELETON

No focal hypermetabolic activity to suggest skeletal metastasis.
IMPRESSION: Hypermetabolic bilateral cervical lymphadenopathy, left side greater
than right, as described above.

Sub-cm hypermetabolic left subpectoral lymph node. No hypermetabolic
mediastinal or hilar lymph nodes identified.

Hypermetabolic activity throughout the lingular and palatine tonsils
in Waldeyer's ring.

No evidence of metastatic disease within the abdomen or pelvis.

## 2016-07-17 ENCOUNTER — Other Ambulatory Visit: Payer: Self-pay | Admitting: Nurse Practitioner

## 2016-07-17 DIAGNOSIS — Z1231 Encounter for screening mammogram for malignant neoplasm of breast: Secondary | ICD-10-CM

## 2016-07-17 DIAGNOSIS — K3189 Other diseases of stomach and duodenum: Secondary | ICD-10-CM | POA: Diagnosis not present

## 2016-07-17 DIAGNOSIS — K449 Diaphragmatic hernia without obstruction or gangrene: Secondary | ICD-10-CM | POA: Diagnosis not present

## 2016-07-17 DIAGNOSIS — Z800 Family history of malignant neoplasm of digestive organs: Secondary | ICD-10-CM | POA: Diagnosis not present

## 2016-07-17 DIAGNOSIS — K2210 Ulcer of esophagus without bleeding: Secondary | ICD-10-CM | POA: Diagnosis not present

## 2016-07-17 MED FILL — PANTOPRAZOLE SOD DR 40 MG T: 40 | 30 days supply | Qty: 30 | Fill #0

## 2016-08-24 ENCOUNTER — Ambulatory Visit
Admission: RE | Admit: 2016-08-24 | Discharge: 2016-08-24 | Disposition: A | Discharge status: Home / Self Care | Payer: 59 | Source: Ambulatory Visit | Attending: Nurse Practitioner | Admitting: Nurse Practitioner

## 2016-08-24 DIAGNOSIS — Z1231 Encounter for screening mammogram for malignant neoplasm of breast: Secondary | ICD-10-CM

## 2016-09-07 DIAGNOSIS — E890 Postprocedural hypothyroidism: Secondary | ICD-10-CM | POA: Diagnosis not present

## 2016-09-10 DIAGNOSIS — M722 Plantar fascial fibromatosis: Secondary | ICD-10-CM

## 2016-09-11 MED FILL — SYNTHROID 125 MCG TABLET: 125 | 90 days supply | Qty: 90 | Fill #0

## 2016-09-11 MED FILL — PANTOPRAZOLE SOD DR 40 MG T: 40 | 90 days supply | Qty: 90 | Fill #1

## 2016-10-22 DIAGNOSIS — E890 Postprocedural hypothyroidism: Secondary | ICD-10-CM | POA: Diagnosis not present

## 2016-10-22 DIAGNOSIS — E1165 Type 2 diabetes mellitus with hyperglycemia: Secondary | ICD-10-CM | POA: Diagnosis not present

## 2016-10-22 DIAGNOSIS — C73 Malignant neoplasm of thyroid gland: Secondary | ICD-10-CM | POA: Diagnosis not present

## 2016-10-22 DIAGNOSIS — E559 Vitamin D deficiency, unspecified: Secondary | ICD-10-CM | POA: Diagnosis not present

## 2016-12-25 ENCOUNTER — Ambulatory Visit (HOSPITAL_BASED_OUTPATIENT_CLINIC_OR_DEPARTMENT_OTHER): Payer: 59 | Admitting: Hematology and Oncology

## 2016-12-25 ENCOUNTER — Encounter: Payer: Self-pay | Admitting: Hematology and Oncology

## 2016-12-25 ENCOUNTER — Telehealth: Payer: Self-pay | Admitting: Hematology and Oncology

## 2016-12-25 ENCOUNTER — Other Ambulatory Visit (HOSPITAL_BASED_OUTPATIENT_CLINIC_OR_DEPARTMENT_OTHER): Payer: 59

## 2016-12-25 VITALS — BP 125/78 | HR 87 | Temp 98.6°F | Resp 17 | Ht 64.0 in | Wt 158.1 lb

## 2016-12-25 DIAGNOSIS — Z85850 Personal history of malignant neoplasm of thyroid: Secondary | ICD-10-CM | POA: Diagnosis not present

## 2016-12-25 DIAGNOSIS — R079 Chest pain, unspecified: Secondary | ICD-10-CM

## 2016-12-25 DIAGNOSIS — D72819 Decreased white blood cell count, unspecified: Secondary | ICD-10-CM

## 2016-12-25 DIAGNOSIS — R1310 Dysphagia, unspecified: Secondary | ICD-10-CM

## 2016-12-25 DIAGNOSIS — Z8572 Personal history of non-Hodgkin lymphomas: Secondary | ICD-10-CM | POA: Diagnosis not present

## 2016-12-25 DIAGNOSIS — Z8579 Personal history of other malignant neoplasms of lymphoid, hematopoietic and related tissues: Secondary | ICD-10-CM

## 2016-12-25 DIAGNOSIS — K209 Esophagitis, unspecified without bleeding: Secondary | ICD-10-CM

## 2016-12-25 LAB — COMPREHENSIVE METABOLIC PANEL
ALBUMIN: 3.9 g/dL (ref 3.5–5.0)
ALK PHOS: 67 U/L (ref 40–150)
ALT: 9 U/L (ref 0–55)
AST: 17 U/L (ref 5–34)
Anion Gap: 8 mEq/L (ref 3–11)
BUN: 10.7 mg/dL (ref 7.0–26.0)
CALCIUM: 9.2 mg/dL (ref 8.4–10.4)
CO2: 29 mEq/L (ref 22–29)
CREATININE: 0.9 mg/dL (ref 0.6–1.1)
Chloride: 104 mEq/L (ref 98–109)
EGFR: 84 mL/min/{1.73_m2} — ABNORMAL LOW (ref >90)
GLUCOSE: 96 mg/dL (ref 70–140)
Potassium: 3.9 mEq/L (ref 3.5–5.1)
SODIUM: 141 meq/L (ref 136–145)
Total Bilirubin: 0.81 mg/dL (ref 0.20–1.20)
Total Protein: 7.2 g/dL (ref 6.4–8.3)

## 2016-12-25 LAB — CBC WITH DIFFERENTIAL/PLATELET
BASO%: 0.3 % (ref 0.0–2.0)
Basophils Absolute: 0 10*3/uL (ref 0.0–0.1)
EOS%: 2.4 % (ref 0.0–7.0)
Eosinophils Absolute: 0.1 10*3/uL (ref 0.0–0.5)
HEMATOCRIT: 37.3 % (ref 34.8–46.6)
HEMOGLOBIN: 12.6 g/dL (ref 11.6–15.9)
LYMPH%: 37 % (ref 14.0–49.7)
MCH: 28.9 pg (ref 25.1–34.0)
MCHC: 33.8 g/dL (ref 31.5–36.0)
MCV: 85.6 fL (ref 79.5–101.0)
MONO#: 0.3 10*3/uL (ref 0.1–0.9)
MONO%: 10.6 % (ref 0.0–14.0)
NEUT#: 1.5 10*3/uL (ref 1.5–6.5)
NEUT%: 49.7 % (ref 38.4–76.8)
Platelets: 251 10*3/uL (ref 145–400)
RBC: 4.36 10*6/uL (ref 3.70–5.45)
RDW: 13.3 % (ref 11.2–14.5)
WBC: 2.9 10*3/uL — AB (ref 3.9–10.3)
lymph#: 1.1 10*3/uL (ref 0.9–3.3)

## 2016-12-25 LAB — LACTATE DEHYDROGENASE: LDH: 165 U/L (ref 125–245)

## 2016-12-25 NOTE — Assessment & Plan Note (Signed)
She has chronic neutropenia since treatment. She has been asymptomatic for the last 3 months. Monitor only.

## 2016-12-25 NOTE — Assessment & Plan Note (Signed)
She has history of esophagitis She will continue proton pump inhibitor

## 2016-12-25 NOTE — Telephone Encounter (Signed)
Appointments scheduled per 11/2 LOS. Patient given AVS report and calendars with future scheduled appointments °

## 2016-12-25 NOTE — Progress Notes (Signed)
[facility] OFFICE PROGRESS NOTE  Patient Care Team: Jacelyn Pi, MD as PCP - General (Endocrinology) Megan Salon, MD as Consulting Physician (Gynecology) Heath Lark, MD as Consulting Physician (Hematology and Oncology) Leota Sauers, RN as Oncology Nurse Navigator Eppie Gibson, MD as Attending Physician (Radiation Oncology) Frederik Pear, MD as Consulting Physician (Orthopedic Surgery) Fanny Skates, MD as Consulting Physician (General Surgery)  SUMMARY OF ONCOLOGIC HISTORY: Oncology History   DLBCL (diffuse large B cell lymphoma)   Staging form: Lymphoid Neoplasms, AJCC 6th Edition     Clinical stage from 11/15/2014: Stage II - Signed by Heath Lark, MD on 11/15/2014       History of lymphoma   09/14/2014 Imaging    PET/CT scan showed Hypermetabolic bilateral cervical lymphadenopathy, left side greater than right, and sub-cm hypermetabolic left subpectoral lymph node. Hypermetabolic activity throughout the lingular and palatine tonsils in Waldeyer's ring      10/12/2014 Procedure    Biopsy of the left neck lymph node came back positive for diffuse large B-cell lymphoma.Accession: [id number]      10/24/2014 Procedure    PAC placed.      10/24/2014 Bone Marrow Biopsy    Bone marrow biopsy was negative.Cytogenetics were normal      10/24/2014 Imaging    Echocardiogram showed normal ejection fraction      10/25/2014 - 12/06/2014 Chemotherapy    She received 3 cyles of R CHOP chemotherapy. Further doses of R CHOP chemotherapy was reduced due to recent neutropenic fever with cycle 1 of treatment.      11/01/2014 Adverse Reaction    She was seen in the emergency Department for possible neutropenic fever. Cultures were negative. She received antibiotic therapy.      11/15/2014 - 12/06/2014 Chemotherapy    Intrathecal chemotherapy is added due to risk of CNS relapse      01/04/2015 Imaging    PET CT scan show complete remission from lymphadenopathy.  Incidental finding of right shoulder arthropathy      01/16/2015 Imaging    MRI of the right shoulder show no evidence of cancer, with evidence of tendon tear and possible changes related to trauma      02/05/2015 - 02/27/2015 Radiation Therapy    IMRT to tonsils, neck and L subpectoral nodal bed:  30.6 Gy in 17 fractions.      04/25/2015 Imaging    CT neck, chest, abdomen/pelvis:  No findings for active lymphomatous involvement.      07/16/2015 Imaging    PET/ CT scans show no evidence of lymphoma. It showed persistent abnormality in the right humeral area      10/18/2015 Procedure    PAC removed.      03/26/2016 Imaging    CT scan of chest, abdomen and pelvis showed no evidence of lymphoma. Noted changes suspicious of infection      03/26/2016 Imaging    CT neck showed asymmetry of the lower palatine tonsils with slight fullness on the left and underfilling of air column at this level.      06/19/2016 Procedure    EGD and colonoscopy showed nonerosive esophagitis       INTERVAL HISTORY: Please see below for problem oriented charting. She returns for follow-up She complained of mild atypical chest discomfort/swallowing difficulties Denies reflux No new lymphadenopathy Denies recent infection  REVIEW OF SYSTEMS:   Constitutional: Denies fevers, chills or abnormal weight loss Eyes: Denies blurriness of vision Ears, nose, mouth, throat, and face: Denies mucositis or  sore throat Respiratory: Denies cough, dyspnea or wheezes Cardiovascular: Denies palpitation, chest discomfort or lower extremity swelling Gastrointestinal:  Denies nausea, heartburn or change in bowel habits Skin: Denies abnormal skin rashes Lymphatics: Denies new lymphadenopathy or easy bruising Neurological:Denies numbness, tingling or new weaknesses Behavioral/Psych: Mood is stable, no new changes  All other systems were reviewed with the patient and are negative.  I have reviewed the past medical history,  past surgical history, social history and family history with the patient and they are unchanged from previous note.  ALLERGIES:  is allergic to metformin and related; tape; levaquin [levofloxacin]; and penicillins.  MEDICATIONS:  Current Outpatient Prescriptions  Medication Sig Dispense Refill  . aspirin EC 81 MG tablet Take 81 mg by mouth every morning.     . Biotin 5000 MCG TABS Take 1 tablet by mouth every morning. Reported on 02/18/2016    . cyanocobalamin 500 MCG tablet Take 500 mcg by mouth daily.    [name] levothyroxine (SYNTHROID, LEVOTHROID) 125 MCG tablet Take 125 mcg by mouth daily before breakfast.    . Multiple Vitamin (MULITIVITAMIN WITH MINERALS) TABS Take 1 tablet by mouth every morning.     . polyethylene glycol (MIRALAX / GLYCOLAX) packet Take 17 g by mouth daily as needed.    . Vitamin D, Cholecalciferol, 1000 UNITS TABS Take 1,000 Units by mouth daily at 12 noon.      No current facility-administered medications for this visit.     PHYSICAL EXAMINATION: ECOG PERFORMANCE STATUS: 1 - Symptomatic but completely ambulatory  Vitals:   12/25/16 1516  BP: 125/78  Pulse: 87  Resp: 17  Temp: 98.6 F (37 C)   Filed Weights   12/25/16 1516  Weight: 158 lb 1.6 oz (71.7 kg)    GENERAL:alert, no distress and comfortable SKIN: skin color, texture, turgor are normal, no rashes or significant lesions EYES: normal, Conjunctiva are pink and non-injected, sclera clear OROPHARYNX:no exudate, no erythema and lips, buccal mucosa, and tongue normal  NECK: supple, thyroid normal size, non-tender, without nodularity LYMPH:  no palpable lymphadenopathy in the cervical, axillary or inguinal LUNGS: clear to auscultation and percussion with normal breathing effort HEART: regular rate & rhythm and no murmurs and no lower extremity edema ABDOMEN:abdomen soft, non-tender and normal bowel sounds Musculoskeletal:no cyanosis of digits and no clubbing  NEURO: alert & oriented x 3 with fluent  speech, no focal motor/sensory deficits  LABORATORY DATA:  I have reviewed the data as listed    Component Value Date/Time   NA 141 12/25/2016 1454   K 3.9 12/25/2016 1454   CL 99 11/01/2014 1424   CL 102 08/31/2012 1336   CO2 29 12/25/2016 1454   GLUCOSE 96 12/25/2016 1454   GLUCOSE 95 08/31/2012 1336   BUN 10.7 12/25/2016 1454   CREATININE 0.9 12/25/2016 1454   CALCIUM 9.2 12/25/2016 1454   PROT 7.2 12/25/2016 1454   ALBUMIN 3.9 12/25/2016 1454   AST 17 12/25/2016 1454   ALT 9 12/25/2016 1454   ALKPHOS 67 12/25/2016 1454   BILITOT 0.81 12/25/2016 1454   GFRNONAA >90 11/01/2014 1424   GFRAA >90 11/01/2014 1424    No results found for: SPEP, UPEP  Lab Results  Component Value Date   WBC 2.9 (L) 12/25/2016   NEUTROABS 1.5 12/25/2016   HGB 12.6 12/25/2016   HCT 37.3 12/25/2016   MCV 85.6 12/25/2016   PLT 251 12/25/2016      Chemistry      Component Value Date/Time  NA 141 12/25/2016 1454   K 3.9 12/25/2016 1454   CL 99 11/01/2014 1424   CL 102 08/31/2012 1336   CO2 29 12/25/2016 1454   BUN 10.7 12/25/2016 1454   CREATININE 0.9 12/25/2016 1454      Component Value Date/Time   CALCIUM 9.2 12/25/2016 1454   ALKPHOS 67 12/25/2016 1454   AST 17 12/25/2016 1454   ALT 9 12/25/2016 1454   BILITOT 0.81 12/25/2016 1454       ASSESSMENT & PLAN:  History of lymphoma Her recent CT scan showed no evidence of lymphoma. She has some persistent chest discomfort/swallowing difficulties Just to be sure, I plan to order a CT scan of the chest to exclude lymphoma recurrence I will call her with test results If the CT scan is negative, I plan to see her back in 9 months with history, physical examination and blood work  Esophagitis She has history of esophagitis She will continue proton pump inhibitor  Hx of papillary thyroid carcinoma She is on chronic suppressive therapy with thyroid medicine. I will defer to his endocrinologist for further management    Chronic  leukopenia She has chronic neutropenia since treatment. She has been asymptomatic for the last 3 months. Monitor only.    Orders Placed This Encounter  Procedures  . CT CHEST W CONTRAST    Standing Status:   Future    Standing Expiration Date:   01/29/2018    Order Specific Question:   Reason for exam:    Answer:   staging lymphoma, dysphagia sendation, atypical chest discomfort, exclude recurrence    Order Specific Question:   Is the patient pregnant?    Answer:   No    Order Specific Question:   Preferred imaging location?    Answer:   [facility]  . CBC with Differential/Platelet    Standing Status:   Future    Standing Expiration Date:   01/29/2018  . Comprehensive metabolic panel    Standing Status:   Future    Standing Expiration Date:   01/29/2018  . Lactate dehydrogenase    Standing Status:   Future    Standing Expiration Date:   01/29/2018   All questions were answered. The patient knows to call the clinic with any problems, questions or concerns. No barriers to learning was detected. I spent 15 minutes counseling the patient face to face. The total time spent in the appointment was 20 minutes and more than 50% was on counseling and review of test results     Heath Lark, MD 12/25/2016 4:18 PM

## 2016-12-25 NOTE — Assessment & Plan Note (Signed)
Her recent CT scan showed no evidence of lymphoma. She has some persistent chest discomfort/swallowing difficulties Just to be sure, I plan to order a CT scan of the chest to exclude lymphoma recurrence I will call her with test results If the CT scan is negative, I plan to see her back in 9 months with history, physical examination and blood work

## 2016-12-25 NOTE — Assessment & Plan Note (Signed)
She is on chronic suppressive therapy with thyroid medicine. I will defer to his endocrinologist for further management

## 2016-12-29 ENCOUNTER — Encounter (HOSPITAL_COMMUNITY): Payer: Self-pay

## 2017-01-11 ENCOUNTER — Telehealth: Payer: Self-pay | Admitting: *Deleted

## 2017-01-11 ENCOUNTER — Encounter (HOSPITAL_COMMUNITY): Payer: Self-pay

## 2017-01-11 ENCOUNTER — Ambulatory Visit (HOSPITAL_COMMUNITY)
Admission: RE | Admit: 2017-01-11 | Discharge: 2017-01-11 | Disposition: A | Discharge status: Home / Self Care | Payer: 59 | Source: Ambulatory Visit | Attending: Hematology and Oncology | Admitting: Hematology and Oncology

## 2017-01-11 DIAGNOSIS — Z8579 Personal history of other malignant neoplasms of lymphoid, hematopoietic and related tissues: Secondary | ICD-10-CM | POA: Diagnosis not present

## 2017-01-11 DIAGNOSIS — C8590 Non-Hodgkin lymphoma, unspecified, unspecified site: Secondary | ICD-10-CM | POA: Diagnosis not present

## 2017-01-11 DIAGNOSIS — H524 Presbyopia: Secondary | ICD-10-CM | POA: Diagnosis not present

## 2017-01-11 MED ORDER — IOPAMIDOL (ISOVUE-300) INJECTION 61%
INTRAVENOUS | Status: AC
  Filled 2017-01-11: qty 75

## 2017-01-11 MED ORDER — IOPAMIDOL (ISOVUE-300) INJECTION 61%
75.0000 mL | Freq: Once | INTRAVENOUS | Status: AC | PRN
  Administered 2017-01-11: 75 mL via INTRAVENOUS

## 2017-01-11 NOTE — Telephone Encounter (Signed)
-----   Message from Heath Lark, MD sent at 01/11/2017 12:12 PM EST ----- Regarding: CT result Pls tell patient CT is negative for lymphoma Her symptoms are likely related to probably esophagitis. ----- Message ----- From: Interface, Rad Results In Sent: 01/11/2017  12:06 PM To: Heath Lark, MD

## 2017-01-11 NOTE — Telephone Encounter (Signed)
Pt notified of results

## 2017-03-15 DIAGNOSIS — E890 Postprocedural hypothyroidism: Secondary | ICD-10-CM | POA: Diagnosis not present

## 2017-03-15 DIAGNOSIS — E559 Vitamin D deficiency, unspecified: Secondary | ICD-10-CM | POA: Diagnosis not present

## 2017-03-15 DIAGNOSIS — E7800 Pure hypercholesterolemia, unspecified: Secondary | ICD-10-CM | POA: Diagnosis not present

## 2017-03-15 DIAGNOSIS — C73 Malignant neoplasm of thyroid gland: Secondary | ICD-10-CM | POA: Diagnosis not present

## 2017-03-15 DIAGNOSIS — E1165 Type 2 diabetes mellitus with hyperglycemia: Secondary | ICD-10-CM | POA: Diagnosis not present

## 2017-05-13 MED FILL — SYNTHROID 125 MCG TABLET: 125 | 90 days supply | Qty: 90 | Fill #0

## 2017-05-30 ENCOUNTER — Emergency Department (HOSPITAL_COMMUNITY)
Admission: EM | Admit: 2017-05-30 | Discharge: 2017-05-30 | Disposition: A | Discharge status: Home / Self Care | Payer: 59 | Attending: Emergency Medicine | Admitting: Emergency Medicine

## 2017-05-30 ENCOUNTER — Emergency Department (HOSPITAL_COMMUNITY): Payer: 59

## 2017-05-30 ENCOUNTER — Encounter (HOSPITAL_COMMUNITY): Payer: Self-pay | Admitting: Emergency Medicine

## 2017-05-30 DIAGNOSIS — Z85850 Personal history of malignant neoplasm of thyroid: Secondary | ICD-10-CM | POA: Insufficient documentation

## 2017-05-30 DIAGNOSIS — R42 Dizziness and giddiness: Secondary | ICD-10-CM

## 2017-05-30 DIAGNOSIS — Z7982 Long term (current) use of aspirin: Secondary | ICD-10-CM | POA: Insufficient documentation

## 2017-05-30 DIAGNOSIS — E039 Hypothyroidism, unspecified: Secondary | ICD-10-CM | POA: Insufficient documentation

## 2017-05-30 DIAGNOSIS — R079 Chest pain, unspecified: Secondary | ICD-10-CM | POA: Diagnosis not present

## 2017-05-30 DIAGNOSIS — Z79899 Other long term (current) drug therapy: Secondary | ICD-10-CM | POA: Insufficient documentation

## 2017-05-30 DIAGNOSIS — Z880 Allergy status to penicillin: Secondary | ICD-10-CM | POA: Insufficient documentation

## 2017-05-30 DIAGNOSIS — L231 Allergic contact dermatitis due to adhesives: Secondary | ICD-10-CM | POA: Insufficient documentation

## 2017-05-30 LAB — CBC
HEMATOCRIT: 36.5 % (ref 36.0–46.0)
Hemoglobin: 11.9 g/dL — ABNORMAL LOW (ref 12.0–15.0)
MCH: 28.5 pg (ref 26.0–34.0)
MCHC: 32.6 g/dL (ref 30.0–36.0)
MCV: 87.5 fL (ref 78.0–100.0)
PLATELETS: 262 10*3/uL (ref 150–400)
RBC: 4.17 MIL/uL (ref 3.87–5.11)
RDW: 13.6 % (ref 11.5–15.5)
WBC: 3.2 10*3/uL — ABNORMAL LOW (ref 4.0–10.5)

## 2017-05-30 LAB — BASIC METABOLIC PANEL
Anion gap: 11 (ref 5–15)
BUN: 15 mg/dL (ref 6–20)
CHLORIDE: 103 mmol/L (ref 101–111)
CO2: 27 mmol/L (ref 22–32)
CREATININE: 0.91 mg/dL (ref 0.44–1.00)
Calcium: 9.2 mg/dL (ref 8.9–10.3)
GFR calc Af Amer: >60 mL/min (ref >60)
GFR calc non Af Amer: >60 mL/min (ref >60)
GLUCOSE: 106 mg/dL — AB (ref 65–99)
POTASSIUM: 3.4 mmol/L — AB (ref 3.5–5.1)
SODIUM: 141 mmol/L (ref 135–145)

## 2017-05-30 LAB — TROPONIN I: Troponin I: <0.03 ng/mL (ref <0.03)

## 2017-05-30 MED ORDER — CEPHALEXIN 500 MG PO CAPS: 500.0000 mg | ORAL_CAPSULE | Freq: Once | ORAL | Status: DC

## 2017-05-30 NOTE — ED Provider Notes (Signed)
[facility] DEPT Provider Note   CSN: [id number] Arrival date & time: 05/30/17  1333     History   Chief Complaint Chief Complaint  Patient presents with  . Dizziness  . Chest Pain    HPI Maria Simmons is a 51 y.o. female.  The patient presents for evaluation of dizziness described as a feeling of being off balance when standing and walking.  She denies dizziness, at rest or with head movement.  Yesterday she had a brief episode of headache, behind her left eye that lasted for 20 minutes.  She has not had any fever, chills, nausea, vomiting, weakness or paresthesia.  No similar problems in the past.  She did not eat breakfast or lunch, because of the dizziness, but she is hungry now.  She denies recent cough, chest pain, abdominal or back pain.  There are no other known modifying factors.  HPI  Past Medical History:  Diagnosis Date  . Arthritis of shoulder region, right, degenerative    right shoulder   . Bladder prolapse, female, acquired   . Cancer [facility]) 2007   thyroid, papillary  . Change in bowel habits 03/19/2016  . Constipation   . DLBCL (diffuse large B cell lymphoma) ([place])   . History of B-cell lymphoma 11/18/2015  . Hypercholesterolemia   . Hypothyroidism   . Kidney stones   . Neutropenic fever ([place]) 11/01/2014  . PONV (postoperative nausea and vomiting)    during hysterectomy   . S/P radiation therapy 02/05/15-02/27/15   tonsil/neck,lt subpectoral nodal bed 30.6Gy /17 fx  . Thyroid cancer ([place])   . Wears glasses     Patient Active Problem List   Diagnosis Date Noted  . Esophagitis 06/25/2016  . Stye 06/25/2016  . Change in bowel habits 03/19/2016  . History of B-cell lymphoma 11/18/2015  . Right shoulder pain 01/07/2015  . Anemia in chronic illness 11/15/2014  . Chronic leukopenia 11/15/2014  . 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 History of lymphoma 10/19/2014  . 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  . Leucopenia  09/02/2012    Past Surgical History:  Procedure Laterality Date  . ABDOMINAL HYSTERECTOMY  2000  . COLONOSCOPY    . COLONOSCOPY WITH PROPOFOL N/A 06/19/2016   Procedure: COLONOSCOPY WITH PROPOFOL;  Surgeon: Ronald Lobo, MD;  Location: [facility];  Service: Endoscopy;  Laterality: N/A;  . ESOPHAGOGASTRODUODENOSCOPY (EGD) WITH PROPOFOL N/A 06/19/2016   Procedure: ESOPHAGOGASTRODUODENOSCOPY (EGD) WITH PROPOFOL;  Surgeon: Ronald Lobo, MD;  Location[facility] ENDOSCOPY;  Service: Endoscopy;  Laterality: N/A;  . LAPAROSCOPIC GASTRIC BANDING WITH HIATAL HERNIA REPAIR  05/28/2009  . LYMPH NODE BIOPSY Left 10/12/2014   Procedure: excisional biopsy deep left neck lymph node;  Surgeon: Fanny Skates, MD;  Location: [facility];  Service: General;  Laterality: Left;  . PORT-A-CATH REMOVAL N/A 10/18/2015   Procedure: REMOVAL PORT-A-CATH;  Surgeon: Fanny Skates, MD;  Location: [facility];  Service: General;  Laterality: N/A;  . PORTACATH PLACEMENT Right 10/24/2014   Procedure: INSERTION PORT-A-CATH WITH ULTRASOUND GUIDANCE;  Surgeon: Fanny Skates, MD;  Location: [facility];  Service: General;  Laterality: Right;  . 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  . TUBAL LIGATION  1990    OB History    Gravida Para Term Preterm AB Living   3 2     1 2    SAB TAB Ectopic Multiple Live Births   1  Home Medications    Prior to Admission medications   Medication Sig Start Date End Date Taking? Authorizing Provider  aspirin EC 81 MG tablet Take 81 mg by mouth every morning.    Yes [provider]  Biotin 5000 MCG TABS Take 1 tablet by mouth every morning. Reported on 02/18/2016   Yes [provider]  cyanocobalamin 500 MCG tablet Take 500 mcg by mouth daily.   Yes [provider]  levothyroxine (SYNTHROID, LEVOTHROID) 125 MCG tablet Take 125 mcg by mouth daily before breakfast.   Yes  [provider]  Vitamin D, Cholecalciferol, 1000 UNITS TABS Take 1,000 Units by mouth daily at 12 noon.    Yes [provider]  Multiple Vitamin (MULITIVITAMIN WITH MINERALS) TABS Take 1 tablet by mouth every morning.     [provider]    Family History Family History  Problem Relation Age of Onset  . Colon cancer Mother   . Cancer Mother        colon ca  . Cancer Father        prostate  . Hypertension Father   . Hyperlipidemia Father   . Stroke Father   . Prostate cancer Father   . Cancer Paternal Grandmother        female organ ca    Social History Social History  Substance Use Topics  . Smoking status: Never Smoker  . Smokeless tobacco: Never Used  . Alcohol use No     Allergies   Metformin and related; Tape; Levaquin [levofloxacin]; and Penicillins   Review of Systems Review of Systems  All other systems reviewed and are negative.    Physical Exam Updated Vital Signs BP 126/72   Pulse 71   Temp 98.2 F (36.8 C) (Oral)   Resp 16   Ht 5\' 4"  (1.626 m)   Wt 72.6 kg (160 lb)   LMP 04/13/2000 (Exact Date)   SpO2 100%   BMI 27.46 kg/m   Physical Exam  Constitutional: She is oriented to person, place, and time. She appears well-developed and well-nourished. No distress.  HENT:  Head: Normocephalic and atraumatic.  Eyes: Conjunctivae and EOM are normal. Pupils are equal, round, and reactive to light.  Neck: Normal range of motion and phonation normal. Neck supple.  Cardiovascular: Normal rate and regular rhythm.   Pulmonary/Chest: Effort normal and breath sounds normal. She exhibits no tenderness.  Abdominal: Soft. She exhibits no distension. There is no tenderness. There is no guarding.  Musculoskeletal: Normal range of motion.  Neurological: She is alert and oriented to person, place, and time. She exhibits normal muscle tone.  No dysarthria, aphasia or nystagmus.  No pronator drift.  No ataxia.  She walks with a normal gait.   Skin: Skin is warm and dry.  Psychiatric: She has a normal mood and affect. Her behavior is normal. Judgment and thought content normal.  Nursing note and vitals reviewed.    ED Treatments / Results  Labs (all labs ordered are listed, but only abnormal results are displayed) Labs Reviewed  BASIC METABOLIC PANEL - Abnormal; Notable for the following:       Result Value   Potassium 3.4 (*)    Glucose, Bld 106 (*)    All other components within normal limits  CBC - Abnormal; Notable for the following:    WBC 3.2 (*)    Hemoglobin 11.9 (*)    All other components within normal limits  TROPONIN I    EKG  EKG Interpretation  Date/Time:  Sunday May 30 2017 13:48:25 EDT Ventricular Rate:  59 PR Interval:    QRS Duration: 96 QT Interval:  445 QTC Calculation: 441 R Axis:   49 Text Interpretation:  Sinus rhythm Since last tracing T wave abnormality and abnormal QT have resolved Confirmed by Daleen Bo [id number]) on 05/30/2017 2:54:01 PM       Radiology Dg Chest 2 View  Result Date: 05/30/2017 CLINICAL DATA:  Chest pain EXAM: CHEST  2 VIEW COMPARISON:  11/01/2014 chest radiograph. FINDINGS: Stable cardiomediastinal silhouette with normal heart size. No pneumothorax. No pleural effusion. Lungs appear clear, with no acute consolidative airspace disease and no pulmonary edema. IMPRESSION: No active cardiopulmonary disease. Electronically Signed   By: Ilona Sorrel M.D.   On: 05/30/2017 14:52    Procedures Procedures (including critical care time)  Medications Ordered in ED Medications - No data to display   Initial Impression / Assessment and Plan / ED Course  I have reviewed the triage vital signs and the nursing notes.  Pertinent labs & imaging results that were available during my care of the patient were reviewed by me and considered in my medical decision making (see chart for details).      Patient Vitals for the past 24 hrs:  BP Temp Temp src Pulse Resp SpO2  Height Weight  05/30/17 1620 126/72 - - 71 16 100 % - -  05/30/17 1500 130/71 - - 69 16 100 % - -  05/30/17 1401 - 98.2 F (36.8 C) Oral - - - - -  05/30/17 1348 (!) 151/76 - - 64 16 100 % 5\' 4"  (1.517 m) 61.6 kg (160 lb)    At Discharge- reevaluation with update and discussion. After initial assessment and treatment, an updated evaluation reveals at this time she is able to eat and drink and is comfortable.  She denies dizziness at rest.  She has been able to eat and drink without problem.  Findings discussed with the patient and all questions were answered. Alli Jasmer L    Final Clinical Impressions(s) / ED Diagnoses   Final diagnoses:  Dizziness   Nonspecific dizziness with ambulation.  No other persistent neurologic complaints.  Doubt CVA, ACS, PE or pneumonia.  Nursing Notes Reviewed/ Care Coordinated Applicable Imaging Reviewed Interpretation of Laboratory Data incorporated into ED treatment  The patient appears reasonably screened and/or stabilized for discharge and I doubt any other medical condition or other [facility] requiring further screening, evaluation, or treatment in the ED at this time prior to discharge.  Plan: Home Medications-continue usual medications; Home Treatments-rest, fluids; return here if the recommended treatment, does not improve the symptoms; Recommended follow up-PCP, as needed    New Prescriptions Discharge Medication List as of 05/30/2017  4:06 PM       Daleen Bo, MD 05/30/17 1718

## 2017-05-30 NOTE — Discharge Instructions (Signed)
The cause of your dizziness is not clear.  If the dizziness returns, or if you develop new and concerning symptoms, check with your doctor or return here.  In the meantime make sure that you are eating and drinking regularly, and taking your usual medications.

## 2017-05-30 NOTE — ED Notes (Signed)
Pt tolerating PO fluids and crackers at this time. Denies N/V/ or abd pain. MD notified.

## 2017-05-30 NOTE — ED Triage Notes (Signed)
Patient complains of chest pain and dizziness that started this morning. Patient states that dizziness gets worse when she stands up and tries to walk. States nausea from dizziness.

## 2017-09-20 MED FILL — SYNTHROID 125 MCG TABLET: 125 | 90 days supply | Qty: 90 | Fill #1

## 2017-09-24 ENCOUNTER — Ambulatory Visit (HOSPITAL_BASED_OUTPATIENT_CLINIC_OR_DEPARTMENT_OTHER): Payer: 59 | Admitting: Hematology and Oncology

## 2017-09-24 ENCOUNTER — Other Ambulatory Visit (HOSPITAL_BASED_OUTPATIENT_CLINIC_OR_DEPARTMENT_OTHER): Payer: 59

## 2017-09-24 ENCOUNTER — Telehealth: Payer: Self-pay

## 2017-09-24 ENCOUNTER — Encounter: Payer: Self-pay | Admitting: Hematology and Oncology

## 2017-09-24 VITALS — BP 128/77 | HR 77 | Temp 98.2°F | Resp 16 | Wt 165.5 lb

## 2017-09-24 DIAGNOSIS — Z8579 Personal history of other malignant neoplasms of lymphoid, hematopoietic and related tissues: Secondary | ICD-10-CM | POA: Diagnosis not present

## 2017-09-24 DIAGNOSIS — D519 Vitamin B12 deficiency anemia, unspecified: Secondary | ICD-10-CM | POA: Diagnosis not present

## 2017-09-24 DIAGNOSIS — Z9884 Bariatric surgery status: Secondary | ICD-10-CM

## 2017-09-24 DIAGNOSIS — Z85850 Personal history of malignant neoplasm of thyroid: Secondary | ICD-10-CM

## 2017-09-24 DIAGNOSIS — R42 Dizziness and giddiness: Secondary | ICD-10-CM | POA: Diagnosis not present

## 2017-09-24 DIAGNOSIS — M25552 Pain in left hip: Secondary | ICD-10-CM

## 2017-09-24 DIAGNOSIS — E538 Deficiency of other specified B group vitamins: Secondary | ICD-10-CM | POA: Diagnosis not present

## 2017-09-24 DIAGNOSIS — D509 Iron deficiency anemia, unspecified: Secondary | ICD-10-CM | POA: Insufficient documentation

## 2017-09-24 DIAGNOSIS — Z85820 Personal history of malignant melanoma of skin: Secondary | ICD-10-CM

## 2017-09-24 LAB — COMPREHENSIVE METABOLIC PANEL
ALT: 8 U/L (ref 0–55)
AST: 15 U/L (ref 5–34)
Albumin: 3.5 g/dL (ref 3.5–5.0)
Alkaline Phosphatase: 73 U/L (ref 40–150)
Anion Gap: 8 mEq/L (ref 3–11)
BUN: 12.6 mg/dL (ref 7.0–26.0)
CHLORIDE: 105 meq/L (ref 98–109)
CO2: 28 meq/L (ref 22–29)
CREATININE: 0.9 mg/dL (ref 0.6–1.1)
Calcium: 9 mg/dL (ref 8.4–10.4)
EGFR: >60 mL/min/{1.73_m2} (ref >60)
Glucose: 92 mg/dl (ref 70–140)
Potassium: 3.6 mEq/L (ref 3.5–5.1)
Sodium: 142 mEq/L (ref 136–145)
Total Bilirubin: 0.35 mg/dL (ref 0.20–1.20)
Total Protein: 6.7 g/dL (ref 6.4–8.3)

## 2017-09-24 LAB — CBC WITH DIFFERENTIAL/PLATELET
BASO%: 0.3 % (ref 0.0–2.0)
Basophils Absolute: 0 10*3/uL (ref 0.0–0.1)
EOS%: 3 % (ref 0.0–7.0)
Eosinophils Absolute: 0.1 10*3/uL (ref 0.0–0.5)
HCT: 34.9 % (ref 34.8–46.6)
HGB: 11.3 g/dL — ABNORMAL LOW (ref 11.6–15.9)
LYMPH%: 33.7 % (ref 14.0–49.7)
MCH: 28.5 pg (ref 25.1–34.0)
MCHC: 32.4 g/dL (ref 31.5–36.0)
MCV: 87.9 fL (ref 79.5–101.0)
MONO#: 0.3 10*3/uL (ref 0.1–0.9)
MONO%: 9.4 % (ref 0.0–14.0)
NEUT#: 1.6 10*3/uL (ref 1.5–6.5)
NEUT%: 53.6 % (ref 38.4–76.8)
Platelets: 260 10*3/uL (ref 145–400)
RBC: 3.97 10*6/uL (ref 3.70–5.45)
RDW: 13.6 % (ref 11.2–14.5)
WBC: 3 10*3/uL — AB (ref 3.9–10.3)
lymph#: 1 10*3/uL (ref 0.9–3.3)

## 2017-09-24 LAB — LACTATE DEHYDROGENASE: LDH: 155 U/L (ref 125–245)

## 2017-09-24 NOTE — Assessment & Plan Note (Signed)
The patient have nonspecific symptoms She had significant fatigue, mild pancytopenia and new left hip pain I am concerned about possible cancer recurrence I will order PET CT scan for staging

## 2017-09-24 NOTE — Addendum Note (Signed)
Addended by: Flo Shanks on: 09/24/2017 04:01 PM   Modules accepted: Orders

## 2017-09-24 NOTE — Assessment & Plan Note (Signed)
I am concerned about iron deficiency anemia I would order iron studies

## 2017-09-24 NOTE — Addendum Note (Signed)
Addended by: Flo Shanks on: 09/24/2017 04:08 PM   Modules accepted: Orders

## 2017-09-24 NOTE — Assessment & Plan Note (Signed)
She had nonspecific changes on her last PET CT scan She is now having worsening hip pain and recent dizziness Examination reveals significant tenderness over the left hip region I am concerned about possible cancer recurrence and plan to order PET CT scan for further evaluation

## 2017-09-24 NOTE — Telephone Encounter (Signed)
Patient on MyChart aware of scheduled appointment date, and time. Per 10/12 los

## 2017-09-24 NOTE — Assessment & Plan Note (Signed)
The patient have nonspecific symptoms of fatigue She follows with endocrinologist carefully With her nonspecific symptoms of pain and pancytopenia, I will order PET CT scan to rule out metastatic cancer

## 2017-09-24 NOTE — Assessment & Plan Note (Signed)
Suspect she may have B12 deficiency given her history of laparoscopic gastric banding I will order B12 level in her next visit

## 2017-09-24 NOTE — Progress Notes (Signed)
[facility] OFFICE PROGRESS NOTE  Patient Care Team: Jacelyn Pi, MD as PCP - General (Endocrinology) Megan Salon, MD as Consulting Physician (Gynecology) Heath Lark, MD as Consulting Physician (Hematology and Oncology) Leota Sauers, RN as Oncology Nurse Navigator Eppie Gibson, MD as Attending Physician (Radiation Oncology) Frederik Pear, MD as Consulting Physician (Orthopedic Surgery) Fanny Skates, MD as Consulting Physician (General Surgery)  SUMMARY OF ONCOLOGIC HISTORY: Oncology History   DLBCL (diffuse large B cell lymphoma)   Staging form: Lymphoid Neoplasms, AJCC 6th Edition     Clinical stage from 11/15/2014: Stage II - Signed by Heath Lark, MD on 11/15/2014       History of lymphoma   09/14/2014 Imaging    PET/CT scan showed Hypermetabolic bilateral cervical lymphadenopathy, left side greater than right, and sub-cm hypermetabolic left subpectoral lymph node. Hypermetabolic activity throughout the lingular and palatine tonsils in Waldeyer's ring      10/12/2014 Procedure    Biopsy of the left neck lymph node came back positive for diffuse large B-cell lymphoma.Accession: [id number]      10/24/2014 Procedure    PAC placed.      10/24/2014 Bone Marrow Biopsy    Bone marrow biopsy was negative.Cytogenetics were normal      10/24/2014 Imaging    Echocardiogram showed normal ejection fraction      10/25/2014 - 12/06/2014 Chemotherapy    She received 3 cyles of R CHOP chemotherapy. Further doses of R CHOP chemotherapy was reduced due to recent neutropenic fever with cycle 1 of treatment.      11/01/2014 Adverse Reaction    She was seen in the emergency Department for possible neutropenic fever. Cultures were negative. She received antibiotic therapy.      11/15/2014 - 12/06/2014 Chemotherapy    Intrathecal chemotherapy is added due to risk of CNS relapse      01/04/2015 Imaging    PET CT scan show complete remission from  lymphadenopathy. Incidental finding of right shoulder arthropathy      01/16/2015 Imaging    MRI of the right shoulder show no evidence of cancer, with evidence of tendon tear and possible changes related to trauma      02/05/2015 - 02/27/2015 Radiation Therapy    IMRT to tonsils, neck and L subpectoral nodal bed:  30.6 Gy in 17 fractions.      04/25/2015 Imaging    CT neck, chest, abdomen/pelvis:  No findings for active lymphomatous involvement.      07/16/2015 Imaging    PET/ CT scans show no evidence of lymphoma. It showed persistent abnormality in the right humeral area      10/18/2015 Procedure    PAC removed.      03/26/2016 Imaging    CT scan of chest, abdomen and pelvis showed no evidence of lymphoma. Noted changes suspicious of infection      03/26/2016 Imaging    CT neck showed asymmetry of the lower palatine tonsils with slight fullness on the left and underfilling of air column at this level.      06/19/2016 Procedure    EGD and colonoscopy showed nonerosive esophagitis      01/11/2017 Imaging    No evidence of recurrent lymphoma or other acute findings within the thorax.       INTERVAL HISTORY: Please see below for problem oriented charting. She is seen for further follow-up She complain of recent dizziness She denies new lymphadenopathy She complain of mild weakness, fatigue and recent new onset of  left thigh/hip pain The pain is intermittent and is getting slightly more severe over the last month She denies recent infection  REVIEW OF SYSTEMS:   Constitutional: Denies fevers, chills or abnormal weight loss Eyes: Denies blurriness of vision Ears, nose, mouth, throat, and face: Denies mucositis or sore throat Respiratory: Denies cough, dyspnea or wheezes Cardiovascular: Denies palpitation, chest discomfort or lower extremity swelling Gastrointestinal:  Denies nausea, heartburn or change in bowel habits Skin: Denies abnormal skin rashes Lymphatics: Denies new  lymphadenopathy or easy bruising Neurological:Denies numbness, tingling or new weaknesses Behavioral/Psych: Mood is stable, no new changes  All other systems were reviewed with the patient and are negative.  I have reviewed the past medical history, past surgical history, social history and family history with the patient and they are unchanged from previous note.  ALLERGIES:  is allergic to metformin and related; tape; levaquin [levofloxacin]; and penicillins.  MEDICATIONS:  Current Outpatient Prescriptions  Medication Sig Dispense Refill  . aspirin EC 81 MG tablet Take 81 mg by mouth every morning.     . Biotin 5000 MCG TABS Take 1 tablet by mouth every morning. Reported on 02/18/2016    . cyanocobalamin 500 MCG tablet Take 500 mcg by mouth daily.    [name] levothyroxine (SYNTHROID, LEVOTHROID) 125 MCG tablet Take 125 mcg by mouth daily before breakfast.    . Multiple Vitamin (MULITIVITAMIN WITH MINERALS) TABS Take 1 tablet by mouth every morning.     . Vitamin D, Cholecalciferol, 1000 UNITS TABS Take 1,000 Units by mouth daily at 12 noon.      No current facility-administered medications for this visit.     PHYSICAL EXAMINATION: ECOG PERFORMANCE STATUS: 1 - Symptomatic but completely ambulatory  Vitals:   09/24/17 1505  BP: 128/77  Pulse: 77  Resp: 16  Temp: 98.2 F (36.8 C)  SpO2: 100%   Filed Weights   09/24/17 1505  Weight: 165 lb 8 oz (75.1 kg)    GENERAL:alert, no distress and comfortable SKIN: skin color, texture, turgor are normal, no rashes or significant lesions EYES: normal, Conjunctiva are pink and non-injected, sclera clear OROPHARYNX:no exudate, no erythema and lips, buccal mucosa, and tongue normal  NECK: Noted well-healed thyroidectomy scar LYMPH:  no palpable lymphadenopathy in the cervical, axillary or inguinal LUNGS: clear to auscultation and percussion with normal breathing effort HEART: regular rate & rhythm and no murmurs and no lower extremity  edema ABDOMEN:abdomen soft, non-tender and normal bowel sounds Musculoskeletal:no cyanosis of digits and no clubbing.  She has significant left hip tenderness on exam NEURO: alert & oriented x 3 with fluent speech, no focal motor/sensory deficits  LABORATORY DATA:  I have reviewed the data as listed    Component Value Date/Time   NA 142 09/24/2017 1438   K 3.6 09/24/2017 1438   CL 103 05/30/2017 1345   CL 102 08/31/2012 1336   CO2 28 09/24/2017 1438   GLUCOSE 92 09/24/2017 1438   GLUCOSE 95 08/31/2012 1336   BUN 12.6 09/24/2017 1438   CREATININE 0.9 09/24/2017 1438   CALCIUM 9.0 09/24/2017 1438   PROT 6.7 09/24/2017 1438   ALBUMIN 3.5 09/24/2017 1438   AST 15 09/24/2017 1438   ALT 8 09/24/2017 1438   ALKPHOS 73 09/24/2017 1438   BILITOT 0.35 09/24/2017 1438   GFRNONAA >60 05/30/2017 1345   GFRAA >60 05/30/2017 1345    No results found for: SPEP, UPEP  Lab Results  Component Value Date   WBC 3.0 (L) 09/24/2017  NEUTROABS 1.6 09/24/2017   HGB 11.3 (L) 09/24/2017   HCT 34.9 09/24/2017   MCV 87.9 09/24/2017   PLT 260 09/24/2017      Chemistry      Component Value Date/Time   NA 142 09/24/2017 1438   K 3.6 09/24/2017 1438   CL 103 05/30/2017 1345   CL 102 08/31/2012 1336   CO2 28 09/24/2017 1438   BUN 12.6 09/24/2017 1438   CREATININE 0.9 09/24/2017 1438      Component Value Date/Time   CALCIUM 9.0 09/24/2017 1438   ALKPHOS 73 09/24/2017 1438   AST 15 09/24/2017 1438   ALT 8 09/24/2017 1438   BILITOT 0.35 09/24/2017 1438      ASSESSMENT & PLAN:  History of lymphoma The patient have nonspecific symptoms She had significant fatigue, mild pancytopenia and new left hip pain I am concerned about possible cancer recurrence I will order PET CT scan for staging  Hx of papillary thyroid carcinoma The patient have nonspecific symptoms of fatigue She follows with endocrinologist carefully With her nonspecific symptoms of pain and pancytopenia, I will order PET  CT scan to rule out metastatic cancer  B12 deficiency anemia Suspect she may have B12 deficiency given her history of laparoscopic gastric banding I will order B12 level in her next visit  Iron deficiency anemia I am concerned about iron deficiency anemia I would order iron studies  Left hip pain She had nonspecific changes on her last PET CT scan She is now having worsening hip pain and recent dizziness Examination reveals significant tenderness over the left hip region I am concerned about possible cancer recurrence and plan to order PET CT scan for further evaluation   Orders Placed This Encounter  Procedures  . NM PET Image Restag (PS) Skull Base To Thigh    Standing Status:   Future    Standing Expiration Date:   09/24/2018    Order Specific Question:   If indicated for the ordered procedure, I authorize the administration of a radiopharmaceutical per Radiology protocol    Answer:   Yes    Order Specific Question:   Preferred imaging location?    Answer:   [facility]    Order Specific Question:   Radiology Contrast Protocol - do NOT remove file path    Answer:   [email or web address]    Order Specific Question:   Is the patient pregnant?    Answer:   Yes   All questions were answered. The patient knows to call the clinic with any problems, questions or concerns. No barriers to learning was detected. I spent 25 minutes counseling the patient face to face. The total time spent in the appointment was 30 minutes and more than 50% was on counseling and review of test results     Heath Lark, MD 09/24/2017 3:30 PM

## 2017-09-27 ENCOUNTER — Telehealth: Payer: Self-pay | Admitting: *Deleted

## 2017-09-27 LAB — IRON AND TIBC
%SAT: 14 % — AB (ref 21–57)
IRON: 49 ug/dL (ref 41–142)
TIBC: 349 ug/dL (ref 236–444)
UIBC: 300 ug/dL (ref 120–384)

## 2017-09-27 LAB — VITAMIN B12: Vitamin B12: 452 pg/mL (ref 232–1245)

## 2017-09-27 NOTE — Telephone Encounter (Signed)
Pt called to let Dr Alvy Bimler know she has changed OB/GYN MD from Dr Edwinna Areola to Dr Servando Salina.  MD list updated

## 2017-09-29 ENCOUNTER — Other Ambulatory Visit: Payer: Self-pay | Admitting: Obstetrics and Gynecology

## 2017-09-29 DIAGNOSIS — Z6828 Body mass index (BMI) 28.0-28.9, adult: Secondary | ICD-10-CM | POA: Diagnosis not present

## 2017-09-29 DIAGNOSIS — Z01419 Encounter for gynecological examination (general) (routine) without abnormal findings: Secondary | ICD-10-CM | POA: Diagnosis not present

## 2017-10-06 ENCOUNTER — Ambulatory Visit (HOSPITAL_COMMUNITY)
Admission: RE | Admit: 2017-10-06 | Discharge: 2017-10-06 | Disposition: A | Discharge status: Home / Self Care | Payer: 59 | Source: Ambulatory Visit | Attending: Hematology and Oncology | Admitting: Hematology and Oncology

## 2017-10-06 DIAGNOSIS — K149 Disease of tongue, unspecified: Secondary | ICD-10-CM | POA: Insufficient documentation

## 2017-10-06 DIAGNOSIS — Z9884 Bariatric surgery status: Secondary | ICD-10-CM | POA: Diagnosis not present

## 2017-10-06 DIAGNOSIS — M1612 Unilateral primary osteoarthritis, left hip: Secondary | ICD-10-CM | POA: Diagnosis not present

## 2017-10-06 DIAGNOSIS — C8330 Diffuse large B-cell lymphoma, unspecified site: Secondary | ICD-10-CM | POA: Diagnosis not present

## 2017-10-06 DIAGNOSIS — Z8572 Personal history of non-Hodgkin lymphomas: Secondary | ICD-10-CM | POA: Diagnosis not present

## 2017-10-06 DIAGNOSIS — K118 Other diseases of salivary glands: Secondary | ICD-10-CM | POA: Diagnosis not present

## 2017-10-06 DIAGNOSIS — M25552 Pain in left hip: Secondary | ICD-10-CM

## 2017-10-06 DIAGNOSIS — Z85850 Personal history of malignant neoplasm of thyroid: Secondary | ICD-10-CM | POA: Insufficient documentation

## 2017-10-06 DIAGNOSIS — Z8579 Personal history of other malignant neoplasms of lymphoid, hematopoietic and related tissues: Secondary | ICD-10-CM

## 2017-10-06 DIAGNOSIS — D519 Vitamin B12 deficiency anemia, unspecified: Secondary | ICD-10-CM

## 2017-10-06 DIAGNOSIS — D509 Iron deficiency anemia, unspecified: Secondary | ICD-10-CM

## 2017-10-06 LAB — GLUCOSE, CAPILLARY: Glucose-Capillary: 106 mg/dL — ABNORMAL HIGH (ref 65–99)

## 2017-10-06 MED ORDER — FLUDEOXYGLUCOSE F - 18 (FDG) INJECTION
8.1000 | Freq: Once | INTRAVENOUS | Status: AC | PRN
  Administered 2017-10-06: 8.1 via INTRAVENOUS

## 2017-10-08 ENCOUNTER — Telehealth: Payer: Self-pay | Admitting: *Deleted

## 2017-10-08 NOTE — Telephone Encounter (Signed)
FYI "I had a PET scan October 24 th.  Calling for results.  No F./U scheduled except for lab October 30 th." Request will be routed to provider with expected return to [facility] on October 29 th.

## 2017-10-11 NOTE — Telephone Encounter (Signed)
Called back, okay with appts.

## 2017-10-11 NOTE — Telephone Encounter (Signed)
Called and left below message. Ask to call nurse back regarding appt.

## 2017-10-11 NOTE — Telephone Encounter (Signed)
Pls let her know PET scan showed no signs of lymphoma Her recent iron studies are mildly low. B12 is OK I have cancelled her labs tomorrow If OK with her, I can see her back in 6 months only. Let me know and I can place scheduling orders

## 2017-10-12 ENCOUNTER — Other Ambulatory Visit: Payer: 59

## 2017-10-14 ENCOUNTER — Telehealth: Payer: Self-pay | Admitting: Hematology and Oncology

## 2017-10-14 NOTE — Telephone Encounter (Signed)
Scheduled appt per 10/29 sch msg. Left msg for patient and sending a confirmation letter as well.

## 2017-10-16 DIAGNOSIS — M1612 Unilateral primary osteoarthritis, left hip: Secondary | ICD-10-CM | POA: Diagnosis not present

## 2017-10-16 DIAGNOSIS — M25552 Pain in left hip: Secondary | ICD-10-CM | POA: Diagnosis not present

## 2017-10-28 ENCOUNTER — Other Ambulatory Visit: Payer: Self-pay | Admitting: Orthopedic Surgery

## 2017-10-28 ENCOUNTER — Other Ambulatory Visit: Payer: Self-pay | Admitting: Hematology and Oncology

## 2017-10-28 ENCOUNTER — Telehealth: Payer: Self-pay

## 2017-10-28 DIAGNOSIS — D72819 Decreased white blood cell count, unspecified: Secondary | ICD-10-CM

## 2017-10-28 DIAGNOSIS — Z8572 Personal history of non-Hodgkin lymphomas: Secondary | ICD-10-CM

## 2017-10-28 DIAGNOSIS — D638 Anemia in other chronic diseases classified elsewhere: Secondary | ICD-10-CM

## 2017-10-28 NOTE — Telephone Encounter (Signed)
Faxed surgical clearance request back to [place].

## 2017-10-30 ENCOUNTER — Telehealth: Payer: Self-pay | Admitting: Hematology and Oncology

## 2017-10-30 NOTE — Telephone Encounter (Signed)
Scheduled appt per 11/15 sch msg. Left message for patient regarding appts - sending confirmation letter.

## 2017-11-01 ENCOUNTER — Encounter (HOSPITAL_COMMUNITY): Payer: Self-pay

## 2017-11-01 NOTE — Patient Instructions (Signed)
Maria Simmons  11/01/2017   Your procedure is scheduled on: 11-31-18  Report to [facility] Main  Entrance Take [place]  elevators to 3rd floor to  [place] at   [place] AM.    Call this number if you have problems the morning of surgery [phone]   Remember: ONLY 1 PERSON MAY GO WITH YOU TO SHORT STAY TO GET  READY MORNING OF [place].  Do not eat food or drink liquids :After Midnight.     Take these medicines the morning of surgery with A SIP OF WATER: levothyroxine DO NOT TAKE ANY DIABETIC MEDICATIONS DAY OF YOUR SURGERY                               You may not have any metal on your body including hair pins and              piercings  Do not wear jewelry, make-up, lotions, powders or perfumes, deodorant             Do not wear nail polish.  Do not shave  48 hours prior to surgery.     Do not bring valuables to the hospital. [place].  Contacts, dentures or bridgework may not be worn into surgery.  Leave suitcase in the car. After surgery it may be brought to your room.                  Please read over the following fact sheets you were given: _____________________________________________________________________          [facility] - Preparing for Surgery Before surgery, you can play an important role.  Because skin is not sterile, your skin needs to be as free of germs as possible.  You can reduce the number of germs on your skin by washing with CHG (chlorahexidine gluconate) soap before surgery.  CHG is an antiseptic cleaner which kills germs and bonds with the skin to continue killing germs even after washing. Please DO NOT use if you have an allergy to CHG or antibacterial soaps.  If your skin becomes reddened/irritated stop using the CHG and inform your nurse when you arrive at Short Stay. Do not shave (including legs and underarms) for at least 48 hours prior to the first CHG  shower.  You may shave your face/neck. Please follow these instructions carefully:  1.  Shower with CHG Soap the night before surgery and the  morning of Surgery.  2.  If you choose to wash your hair, wash your hair first as usual with your  normal  shampoo.  3.  After you shampoo, rinse your hair and body thoroughly to remove the  shampoo.                           4.  Use CHG as you would any other liquid soap.  You can apply chg directly  to the skin and wash                       Gently with a scrungie or clean washcloth.  5.  Apply the CHG Soap to your body ONLY FROM THE NECK  DOWN.   Do not use on face/ open                           Wound or open sores. Avoid contact with eyes, ears mouth and genitals (private parts).                       Wash face,  Genitals (private parts) with your normal soap.             6.  Wash thoroughly, paying special attention to the area where your surgery  will be performed.  7.  Thoroughly rinse your body with warm water from the neck down.  8.  DO NOT shower/wash with your normal soap after using and rinsing off  the CHG Soap.                9.  Pat yourself dry with a clean towel.            10.  Wear clean pajamas.            11.  Place clean sheets on your bed the night of your first shower and do not  sleep with pets. Day of Surgery : Do not apply any lotions/deodorants the morning of surgery.  Please wear clean clothes to the hospital/surgery center.  FAILURE TO FOLLOW THESE INSTRUCTIONS MAY RESULT IN THE CANCELLATION OF YOUR SURGERY PATIENT SIGNATURE_________________________________  NURSE SIGNATURE__________________________________  ________________________________________________________________________  WHAT IS A BLOOD TRANSFUSION? Blood Transfusion Information  A transfusion is the replacement of blood or some of its parts. Blood is made up of multiple cells which provide different functions.  Red blood cells carry oxygen and are used for  blood loss replacement.  White blood cells fight against infection.  Platelets control bleeding.  Plasma helps clot blood.  Other blood products are available for specialized needs, such as hemophilia or other clotting disorders. BEFORE THE TRANSFUSION  Who gives blood for transfusions?   Healthy volunteers who are fully evaluated to make sure their blood is safe. This is blood bank blood. Transfusion therapy is the safest it has ever been in the practice of medicine. Before blood is taken from a donor, a complete history is taken to make sure that person has no history of diseases nor engages in risky social behavior (examples are intravenous drug use or sexual activity with multiple partners). The donor's travel history is screened to minimize risk of transmitting infections, such as malaria. The donated blood is tested for signs of infectious diseases, such as HIV and hepatitis. The blood is then tested to be sure it is compatible with you in order to minimize the chance of a transfusion reaction. If you or a relative donates blood, this is often done in anticipation of surgery and is not appropriate for emergency situations. It takes many days to process the donated blood. RISKS AND COMPLICATIONS Although transfusion therapy is very safe and saves many lives, the main dangers of transfusion include:   Getting an infectious disease.  Developing a transfusion reaction. This is an allergic reaction to something in the blood you were given. Every precaution is taken to prevent this. The decision to have a blood transfusion has been considered carefully by your caregiver before blood is given. Blood is not given unless the benefits outweigh the risks. AFTER THE TRANSFUSION  Right after receiving a blood transfusion, you will usually feel much better and more energetic.  This is especially true if your red blood cells have gotten low (anemic). The transfusion raises the level of the red blood  cells which carry oxygen, and this usually causes an energy increase.  The nurse administering the transfusion will monitor you carefully for complications. HOME CARE INSTRUCTIONS  No special instructions are needed after a transfusion. You may find your energy is better. Speak with your caregiver about any limitations on activity for underlying diseases you may have. SEEK MEDICAL CARE IF:   Your condition is not improving after your transfusion.  You develop redness or irritation at the intravenous (IV) site. SEEK IMMEDIATE MEDICAL CARE IF:  Any of the following symptoms occur over the next 12 hours:  Shaking chills.  You have a temperature by mouth above 102 F (38.9 C), not controlled by medicine.  Chest, back, or muscle pain.  People around you feel you are not acting correctly or are confused.  Shortness of breath or difficulty breathing.  Dizziness and fainting.  You get a rash or develop hives.  You have a decrease in urine output.  Your urine turns a dark color or changes to pink, red, or brown. Any of the following symptoms occur over the next 10 days:  You have a temperature by mouth above 102 F (38.9 C), not controlled by medicine.  Shortness of breath.  Weakness after normal activity.  The white part of the eye turns yellow (jaundice).  You have a decrease in the amount of urine or are urinating less often.  Your urine turns a dark color or changes to pink, red, or brown. Document Released: 11/27/2000 Document Revised: 02/22/2012 Document Reviewed: 07/16/2008 ExitCare Patient Information 2014 [place].  _______________________________________________________________________  Incentive Spirometer  An incentive spirometer is a tool that can help keep your lungs clear and active. This tool measures how well you are filling your lungs with each breath. Taking long deep breaths may help reverse or decrease the chance of developing breathing  (pulmonary) problems (especially infection) following:  A long period of time when you are unable to move or be active. BEFORE THE PROCEDURE   If the spirometer includes an indicator to show your best effort, your nurse or respiratory therapist will set it to a desired goal.  If possible, sit up straight or lean slightly forward. Try not to slouch.  Hold the incentive spirometer in an upright position. INSTRUCTIONS FOR USE  1. Sit on the edge of your bed if possible, or sit up as far as you can in bed or on a chair. 2. Hold the incentive spirometer in an upright position. 3. Breathe out normally. 4. Place the mouthpiece in your mouth and seal your lips tightly around it. 5. Breathe in slowly and as deeply as possible, raising the piston or the ball toward the top of the column. 6. Hold your breath for 3-5 seconds or for as long as possible. Allow the piston or ball to fall to the bottom of the column. 7. Remove the mouthpiece from your mouth and breathe out normally. 8. Rest for a few seconds and repeat Steps 1 through 7 at least 10 times every 1-2 hours when you are awake. Take your time and take a few normal breaths between deep breaths. 9. The spirometer may include an indicator to show your best effort. Use the indicator as a goal to work toward during each repetition. 10. After each set of 10 deep breaths, practice coughing to be sure your lungs are clear.  If you have an incision (the cut made at the time of surgery), support your incision when coughing by placing a pillow or rolled up towels firmly against it. Once you are able to get out of bed, walk around indoors and cough well. You may stop using the incentive spirometer when instructed by your caregiver.  RISKS AND COMPLICATIONS  Take your time so you do not get dizzy or light-headed.  If you are in pain, you may need to take or ask for pain medication before doing incentive spirometry. It is harder to take a deep breath if you  are having pain. AFTER USE  Rest and breathe slowly and easily.  It can be helpful to keep track of a log of your progress. Your caregiver can provide you with a simple table to help with this. If you are using the spirometer at home, follow these instructions: [place] IF:   You are having difficultly using the spirometer.  You have trouble using the spirometer as often as instructed.  Your pain medication is not giving enough relief while using the spirometer.  You develop fever of 100.5 F (38.1 C) or higher. SEEK IMMEDIATE MEDICAL CARE IF:   You cough up bloody sputum that had not been present before.  You develop fever of 102 F (38.9 C) or greater.  You develop worsening pain at or near the incision site. MAKE SURE YOU:   Understand these instructions.  Will watch your condition.  Will get help right away if you are not doing well or get worse. Document Released: 04/12/2007 Document Revised: 02/22/2012 Document Reviewed: 06/13/2007 [facility] Patient Information 2014 [place], [place].   ________________________________________________________________________

## 2017-11-01 NOTE — Progress Notes (Addendum)
cxr 05/31/17 epic  ekg 05/31/17 epic  Clearance Dr. Alvy Bimler  For surgery 11/12/17 on chart

## 2017-11-02 ENCOUNTER — Encounter (HOSPITAL_COMMUNITY): Payer: Self-pay

## 2017-11-02 ENCOUNTER — Other Ambulatory Visit: Payer: Self-pay | Admitting: *Deleted

## 2017-11-02 ENCOUNTER — Encounter (HOSPITAL_COMMUNITY)
Admission: RE | Admit: 2017-11-02 | Discharge: 2017-11-02 | Disposition: A | Discharge status: Home / Self Care | Payer: 59 | Source: Ambulatory Visit | Attending: Orthopedic Surgery | Admitting: Orthopedic Surgery

## 2017-11-02 ENCOUNTER — Other Ambulatory Visit: Payer: Self-pay

## 2017-11-02 DIAGNOSIS — Z01818 Encounter for other preprocedural examination: Secondary | ICD-10-CM

## 2017-11-02 DIAGNOSIS — Z01812 Encounter for preprocedural laboratory examination: Secondary | ICD-10-CM | POA: Insufficient documentation

## 2017-11-02 DIAGNOSIS — M1612 Unilateral primary osteoarthritis, left hip: Secondary | ICD-10-CM | POA: Insufficient documentation

## 2017-11-02 LAB — COMPREHENSIVE METABOLIC PANEL
ALBUMIN: 3.8 g/dL (ref 3.5–5.0)
ALT: 11 U/L — ABNORMAL LOW (ref 14–54)
ANION GAP: 8 (ref 5–15)
AST: 19 U/L (ref 15–41)
Alkaline Phosphatase: 68 U/L (ref 38–126)
BILIRUBIN TOTAL: 0.6 mg/dL (ref 0.3–1.2)
BUN: 15 mg/dL (ref 6–20)
CALCIUM: 9.2 mg/dL (ref 8.9–10.3)
CO2: 30 mmol/L (ref 22–32)
Chloride: 104 mmol/L (ref 101–111)
Creatinine, Ser: 0.9 mg/dL (ref 0.44–1.00)
GLUCOSE: 111 mg/dL — AB (ref 65–99)
POTASSIUM: 4.3 mmol/L (ref 3.5–5.1)
Sodium: 142 mmol/L (ref 135–145)
TOTAL PROTEIN: 7.1 g/dL (ref 6.5–8.1)

## 2017-11-02 LAB — SURGICAL PCR SCREEN
MRSA, PCR: NEGATIVE
Staphylococcus aureus: NEGATIVE

## 2017-11-02 LAB — URINALYSIS, ROUTINE W REFLEX MICROSCOPIC
Bilirubin Urine: NEGATIVE
Glucose, UA: NEGATIVE mg/dL
KETONES UR: NEGATIVE mg/dL
Nitrite: NEGATIVE
PROTEIN: NEGATIVE mg/dL
Specific Gravity, Urine: 1.026 (ref 1.005–1.030)
pH: 5 (ref 5.0–8.0)

## 2017-11-02 LAB — APTT: aPTT: 27 seconds (ref 24–36)

## 2017-11-02 LAB — CBC WITH DIFFERENTIAL/PLATELET
BASOS PCT: 0 %
Basophils Absolute: 0 10*3/uL (ref 0.0–0.1)
Eosinophils Absolute: 0.1 10*3/uL (ref 0.0–0.7)
Eosinophils Relative: 3 %
HEMATOCRIT: 36.4 % (ref 36.0–46.0)
Hemoglobin: 11.9 g/dL — ABNORMAL LOW (ref 12.0–15.0)
LYMPHS ABS: 1 10*3/uL (ref 0.7–4.0)
Lymphocytes Relative: 40 %
MCH: 28.8 pg (ref 26.0–34.0)
MCHC: 32.7 g/dL (ref 30.0–36.0)
MCV: 88.1 fL (ref 78.0–100.0)
MONO ABS: 0.3 10*3/uL (ref 0.1–1.0)
MONOS PCT: 11 %
NEUTROS ABS: 1.2 10*3/uL — AB (ref 1.7–7.7)
Neutrophils Relative %: 46 %
Platelets: 269 10*3/uL (ref 150–400)
RBC: 4.13 MIL/uL (ref 3.87–5.11)
RDW: 13.1 % (ref 11.5–15.5)
WBC: 2.5 10*3/uL — ABNORMAL LOW (ref 4.0–10.5)

## 2017-11-02 LAB — ABO/RH: ABO/RH(D): B POS

## 2017-11-02 LAB — PROTIME-INR
INR: 0.94
PROTHROMBIN TIME: 12.4 s (ref 11.4–15.2)

## 2017-11-02 NOTE — Patient Outreach (Signed)
[facility]) Care Management  11/02/2017  Maria Simmons 01-20-1966 [id number]   Subjective: Telephone call to patient's home / mobile number, call from patient's work, call to patient, spoke with patient, and HIPAA verified.  Discussed [facility] Care Management UMR Transition of care follow up, preoperative call follow up, patient voiced understanding, and is in agreement to both types of follow up.  Patient states she is doing well, currently at work mobile phone with bad connection, called back on work phone, and advised to call work phone if needed.   States she is ready for surgery, daughter Maria Simmons available to assist post discharge as needed, and will have hip surgery at [facility] on 11/12/17, estimated length of stay 2 days.  Patient voices understanding of medical diagnosis, pending surgery and treatment plan.  States she is accessing the following [facility] benefits: outpatient pharmacy, hospital indemnity (verbally given contact number for [facility] [phone], given contact for [employer] [phone], for itemized bill request, will file claim if appropriate) , and has been approved family medical leave act (FMLA).  Also verbally given contact information for [employer] [phone] document completion benefit for [facility] employees.  States she will call to set up an appointment if needed.  Patient states she does not have any preoperative questions, care coordination, disease management, disease monitoring, transportation, community resource, or pharmacy needs at this time.   States he is very appreciative of the follow up and is in agreement to receive [facility] Management information post transition of care follow up.     Objective: Per KPN (Knowledge Performance Now, point of care tool) and chart review, patient to be admitted to [facility] on 11/12/17 for LEFT TOTAL HIP ARTHROPLASTY  ANTERIOR APPROACH.   Patient has a history of papillary thyroid cancer, DLBCL (diffuse large B cell lymphoma), Hypercholesterolemia, and Hypothyroidism.       Assessment: Received UMR Preoperative  / Transition of care follow up referral on 10/1917.  Preoperative call completed, and transition of care follow up pending notification of patient discharge.    Plan: RNCM will call patient for  telephone outreach attempt, transition of care follow up, within 3 business days of hospital discharge notification.     Maryagnes Carrasco H. Annia Friendly, BSN, [place] Management [facility] Telephonic CM Phone: [phone] Fax: [phone]

## 2017-11-09 MED FILL — CIPROFLOXACIN HCL 500 MG TA: 500 | 3 days supply | Qty: 6 | Fill #0

## 2017-11-12 ENCOUNTER — Other Ambulatory Visit: Payer: Self-pay

## 2017-11-12 ENCOUNTER — Encounter (HOSPITAL_COMMUNITY)
Admission: RE | Disposition: A | Discharge status: Home Health | Payer: Self-pay | Source: Ambulatory Visit | Attending: Orthopedic Surgery

## 2017-11-12 ENCOUNTER — Encounter (HOSPITAL_COMMUNITY): Payer: Self-pay | Admitting: Emergency Medicine

## 2017-11-12 ENCOUNTER — Inpatient Hospital Stay (HOSPITAL_COMMUNITY): Payer: 59 | Admitting: Anesthesiology

## 2017-11-12 ENCOUNTER — Inpatient Hospital Stay (HOSPITAL_COMMUNITY): Payer: 59

## 2017-11-12 ENCOUNTER — Inpatient Hospital Stay (HOSPITAL_COMMUNITY)
Admission: RE | Admit: 2017-11-12 | Discharge: 2017-11-13 | DRG: 470 | Disposition: A | Discharge status: Home Health | Payer: 59 | Source: Ambulatory Visit | Attending: Orthopedic Surgery | Admitting: Orthopedic Surgery

## 2017-11-12 DIAGNOSIS — Z8572 Personal history of non-Hodgkin lymphomas: Secondary | ICD-10-CM

## 2017-11-12 DIAGNOSIS — Z90710 Acquired absence of both cervix and uterus: Secondary | ICD-10-CM

## 2017-11-12 DIAGNOSIS — M1612 Unilateral primary osteoarthritis, left hip: Secondary | ICD-10-CM | POA: Diagnosis not present

## 2017-11-12 DIAGNOSIS — Z419 Encounter for procedure for purposes other than remedying health state, unspecified: Secondary | ICD-10-CM

## 2017-11-12 DIAGNOSIS — R269 Unspecified abnormalities of gait and mobility: Secondary | ICD-10-CM | POA: Diagnosis not present

## 2017-11-12 DIAGNOSIS — Z85850 Personal history of malignant neoplasm of thyroid: Secondary | ICD-10-CM

## 2017-11-12 DIAGNOSIS — D638 Anemia in other chronic diseases classified elsewhere: Secondary | ICD-10-CM | POA: Diagnosis present

## 2017-11-12 DIAGNOSIS — Z923 Personal history of irradiation: Secondary | ICD-10-CM

## 2017-11-12 DIAGNOSIS — E890 Postprocedural hypothyroidism: Secondary | ICD-10-CM | POA: Diagnosis present

## 2017-11-12 DIAGNOSIS — Z96642 Presence of left artificial hip joint: Secondary | ICD-10-CM | POA: Diagnosis not present

## 2017-11-12 DIAGNOSIS — D509 Iron deficiency anemia, unspecified: Secondary | ICD-10-CM | POA: Diagnosis not present

## 2017-11-12 DIAGNOSIS — Z471 Aftercare following joint replacement surgery: Secondary | ICD-10-CM | POA: Diagnosis not present

## 2017-11-12 DIAGNOSIS — E039 Hypothyroidism, unspecified: Secondary | ICD-10-CM | POA: Diagnosis not present

## 2017-11-12 DIAGNOSIS — K209 Esophagitis, unspecified: Secondary | ICD-10-CM | POA: Diagnosis not present

## 2017-11-12 HISTORY — PX: TOTAL HIP ARTHROPLASTY: SHX124

## 2017-11-12 LAB — TYPE AND SCREEN
ABO/RH(D): B POS
ANTIBODY SCREEN: NEGATIVE

## 2017-11-12 SURGERY — ARTHROPLASTY, HIP, TOTAL, ANTERIOR APPROACH
Anesthesia: Monitor Anesthesia Care | Site: Hip | Laterality: Left

## 2017-11-12 MED ORDER — ASPIRIN EC 325 MG PO TBEC
325.0000 mg | DELAYED_RELEASE_TABLET | Freq: Two times a day (BID) | ORAL | Status: DC
  Administered 2017-11-12 – 2017-11-13 (×2): 325 mg via ORAL
  Filled 2017-11-12 (×2): qty 1

## 2017-11-12 MED ORDER — CLINDAMYCIN PHOSPHATE 900 MG/50ML IV SOLN
INTRAVENOUS | Status: AC
  Filled 2017-11-12: qty 50

## 2017-11-12 MED ORDER — ACETAMINOPHEN 650 MG RE SUPP: 650.0000 mg | RECTAL | Status: DC | PRN

## 2017-11-12 MED ORDER — CHLORHEXIDINE GLUCONATE 4 % EX LIQD: 60.0000 mL | Freq: Once | CUTANEOUS | Status: DC

## 2017-11-12 MED ORDER — OXYCODONE HCL 5 MG/5ML PO SOLN
5.0000 mg | Freq: Once | ORAL | Status: DC | PRN
  Filled 2017-11-12: qty 5

## 2017-11-12 MED ORDER — TIZANIDINE HCL 2 MG PO TABS: 2.0000 mg | ORAL_TABLET | Freq: Three times a day (TID) | ORAL | 0 refills | Status: DC | PRN

## 2017-11-12 MED ORDER — BUPIVACAINE HCL (PF) 0.25 % IJ SOLN
INTRAMUSCULAR | Status: DC | PRN
  Administered 2017-11-12: 10 mL

## 2017-11-12 MED ORDER — BUPIVACAINE LIPOSOME 1.3 % IJ SUSP
INTRAMUSCULAR | Status: DC | PRN
  Administered 2017-11-12: 10 mL

## 2017-11-12 MED ORDER — BUPIVACAINE HCL (PF) 0.25 % IJ SOLN
INTRAMUSCULAR | Status: AC
Start: 2017-11-12 — End: 2017-11-12
  Filled 2017-11-12: qty 30

## 2017-11-12 MED ORDER — ACETAMINOPHEN 325 MG PO TABS: 650.0000 mg | ORAL_TABLET | ORAL | Status: DC | PRN

## 2017-11-12 MED ORDER — SODIUM CHLORIDE 0.9 % IR SOLN
Status: DC | PRN
  Administered 2017-11-12: 1000 mL

## 2017-11-12 MED ORDER — ZOLPIDEM TARTRATE 5 MG PO TABS: 5.0000 mg | ORAL_TABLET | Freq: Every evening | ORAL | Status: DC | PRN

## 2017-11-12 MED ORDER — FENTANYL CITRATE (PF) 100 MCG/2ML IJ SOLN
25.0000 ug | INTRAMUSCULAR | Status: DC | PRN
Start: 2017-11-12 — End: 2017-11-12

## 2017-11-12 MED ORDER — FENTANYL CITRATE (PF) 100 MCG/2ML IJ SOLN
INTRAMUSCULAR | Status: DC | PRN
  Administered 2017-11-12: 75 ug via INTRAVENOUS
  Administered 2017-11-12: 25 ug via INTRAVENOUS

## 2017-11-12 MED ORDER — PROPOFOL 500 MG/50ML IV EMUL
INTRAVENOUS | Status: DC | PRN
  Administered 2017-11-12: 75 ug/kg/min via INTRAVENOUS

## 2017-11-12 MED ORDER — DEXAMETHASONE SODIUM PHOSPHATE 10 MG/ML IJ SOLN
10.0000 mg | Freq: Two times a day (BID) | INTRAMUSCULAR | Status: AC
  Administered 2017-11-12 (×2): 10 mg via INTRAVENOUS
  Filled 2017-11-12 (×2): qty 1

## 2017-11-12 MED ORDER — DOCUSATE SODIUM 100 MG PO CAPS: 100.0000 mg | ORAL_CAPSULE | Freq: Two times a day (BID) | ORAL | 0 refills | Status: DC

## 2017-11-12 MED ORDER — BISACODYL 5 MG PO TBEC: 5.0000 mg | DELAYED_RELEASE_TABLET | Freq: Every day | ORAL | Status: DC | PRN

## 2017-11-12 MED ORDER — MAGNESIUM CITRATE PO SOLN: 1.0000 | Freq: Once | ORAL | Status: DC | PRN

## 2017-11-12 MED ORDER — METHOCARBAMOL 1000 MG/10ML IJ SOLN
500.0000 mg | Freq: Four times a day (QID) | INTRAVENOUS | Status: DC | PRN
  Administered 2017-11-12: 500 mg via INTRAVENOUS
  Filled 2017-11-12: qty 550

## 2017-11-12 MED ORDER — EPHEDRINE SULFATE-NACL 50-0.9 MG/10ML-% IV SOSY
PREFILLED_SYRINGE | INTRAVENOUS | Status: DC | PRN
  Administered 2017-11-12 (×3): 5 mg via INTRAVENOUS
  Administered 2017-11-12: 10 mg via INTRAVENOUS

## 2017-11-12 MED ORDER — DOCUSATE SODIUM 100 MG PO CAPS
100.0000 mg | ORAL_CAPSULE | Freq: Two times a day (BID) | ORAL | Status: DC
  Administered 2017-11-12 – 2017-11-13 (×2): 100 mg via ORAL
  Filled 2017-11-12 (×2): qty 1

## 2017-11-12 MED ORDER — TRANEXAMIC ACID 1000 MG/10ML IV SOLN: 1000.0000 mg | INTRAVENOUS | Status: DC

## 2017-11-12 MED ORDER — TRANEXAMIC ACID 1000 MG/10ML IV SOLN
1000.0000 mg | INTRAVENOUS | Status: AC
  Administered 2017-11-12: 1000 mg via INTRAVENOUS
  Filled 2017-11-12: qty 1100

## 2017-11-12 MED ORDER — DIPHENHYDRAMINE HCL 12.5 MG/5ML PO ELIX: 12.5000 mg | ORAL_SOLUTION | ORAL | Status: DC | PRN

## 2017-11-12 MED ORDER — OXYCODONE HCL 5 MG PO TABS: 5.0000 mg | ORAL_TABLET | Freq: Once | ORAL | Status: DC | PRN

## 2017-11-12 MED ORDER — LACTATED RINGERS IV SOLN
INTRAVENOUS | Status: DC | PRN
  Administered 2017-11-12 (×3): via INTRAVENOUS

## 2017-11-12 MED ORDER — TRANEXAMIC ACID 1000 MG/10ML IV SOLN
1000.0000 mg | Freq: Once | INTRAVENOUS | Status: AC
  Administered 2017-11-12: 1000 mg via INTRAVENOUS
  Filled 2017-11-12: qty 1100

## 2017-11-12 MED ORDER — BUPIVACAINE HCL (PF) 0.5 % IJ SOLN
INTRAMUSCULAR | Status: DC | PRN
  Administered 2017-11-12: 3 mL

## 2017-11-12 MED ORDER — MIDAZOLAM HCL 2 MG/2ML IJ SOLN
INTRAMUSCULAR | Status: AC
  Filled 2017-11-12: qty 2

## 2017-11-12 MED ORDER — ASPIRIN EC 325 MG PO TBEC: 325.0000 mg | DELAYED_RELEASE_TABLET | Freq: Two times a day (BID) | ORAL | 0 refills | Status: DC

## 2017-11-12 MED ORDER — METHOCARBAMOL 500 MG PO TABS
500.0000 mg | ORAL_TABLET | Freq: Four times a day (QID) | ORAL | Status: DC | PRN
  Administered 2017-11-12 – 2017-11-13 (×2): 500 mg via ORAL
  Filled 2017-11-12 (×2): qty 1

## 2017-11-12 MED ORDER — PHENYLEPHRINE 40 MCG/ML (10ML) SYRINGE FOR IV PUSH (FOR BLOOD PRESSURE SUPPORT)
PREFILLED_SYRINGE | INTRAVENOUS | Status: DC | PRN
  Administered 2017-11-12 (×9): 80 ug via INTRAVENOUS

## 2017-11-12 MED ORDER — LEVOTHYROXINE SODIUM 125 MCG PO TABS
125.0000 ug | ORAL_TABLET | Freq: Every day | ORAL | Status: DC
  Administered 2017-11-13: 125 ug via ORAL
  Filled 2017-11-12: qty 1

## 2017-11-12 MED ORDER — ONDANSETRON HCL 4 MG PO TABS: 4.0000 mg | ORAL_TABLET | Freq: Four times a day (QID) | ORAL | Status: DC | PRN

## 2017-11-12 MED ORDER — FENTANYL CITRATE (PF) 100 MCG/2ML IJ SOLN
INTRAMUSCULAR | Status: AC
  Filled 2017-11-12: qty 2

## 2017-11-12 MED ORDER — HYDROMORPHONE HCL 1 MG/ML IJ SOLN
0.5000 mg | INTRAMUSCULAR | Status: DC | PRN
  Administered 2017-11-12: 1 mg via INTRAVENOUS
  Filled 2017-11-12: qty 1

## 2017-11-12 MED ORDER — CELECOXIB 200 MG PO CAPS
200.0000 mg | ORAL_CAPSULE | Freq: Two times a day (BID) | ORAL | Status: DC
  Administered 2017-11-12 – 2017-11-13 (×2): 200 mg via ORAL
  Filled 2017-11-12 (×2): qty 1

## 2017-11-12 MED ORDER — OXYCODONE-ACETAMINOPHEN 5-325 MG PO TABS: 1.0000 | ORAL_TABLET | Freq: Four times a day (QID) | ORAL | 0 refills | Status: DC | PRN

## 2017-11-12 MED ORDER — CLINDAMYCIN PHOSPHATE 600 MG/50ML IV SOLN
600.0000 mg | Freq: Three times a day (TID) | INTRAVENOUS | Status: DC
  Administered 2017-11-12 – 2017-11-13 (×3): 600 mg via INTRAVENOUS
  Filled 2017-11-12 (×4): qty 50

## 2017-11-12 MED ORDER — DEXAMETHASONE SODIUM PHOSPHATE 10 MG/ML IJ SOLN
INTRAMUSCULAR | Status: DC | PRN
  Administered 2017-11-12: 10 mg via INTRAVENOUS

## 2017-11-12 MED ORDER — MIDAZOLAM HCL 5 MG/5ML IJ SOLN
INTRAMUSCULAR | Status: DC | PRN
  Administered 2017-11-12: 2 mg via INTRAVENOUS

## 2017-11-12 MED ORDER — ONDANSETRON HCL 4 MG/2ML IJ SOLN
INTRAMUSCULAR | Status: DC | PRN
  Administered 2017-11-12: 4 mg via INTRAVENOUS

## 2017-11-12 MED ORDER — PROPOFOL 10 MG/ML IV BOLUS
INTRAVENOUS | Status: AC
Start: 2017-11-12 — End: 2017-11-12
  Filled 2017-11-12: qty 60

## 2017-11-12 MED ORDER — POLYETHYLENE GLYCOL 3350 17 G PO PACK: 17.0000 g | PACK | Freq: Every day | ORAL | Status: DC | PRN

## 2017-11-12 MED ORDER — STERILE WATER FOR IRRIGATION IR SOLN
Status: DC | PRN
  Administered 2017-11-12: 2000 mL

## 2017-11-12 MED ORDER — ALUM & MAG HYDROXIDE-SIMETH 200-200-20 MG/5ML PO SUSP
30.0000 mL | ORAL | Status: DC | PRN
Start: 2017-11-12 — End: 2017-11-13

## 2017-11-12 MED ORDER — ONDANSETRON HCL 4 MG/2ML IJ SOLN: 4.0000 mg | Freq: Four times a day (QID) | INTRAMUSCULAR | Status: DC | PRN

## 2017-11-12 MED ORDER — MEPERIDINE HCL 50 MG/ML IJ SOLN
INTRAMUSCULAR | Status: AC
  Filled 2017-11-12: qty 1

## 2017-11-12 MED ORDER — PHENYLEPHRINE 40 MCG/ML (10ML) SYRINGE FOR IV PUSH (FOR BLOOD PRESSURE SUPPORT)
PREFILLED_SYRINGE | INTRAVENOUS | Status: AC
  Filled 2017-11-12: qty 10

## 2017-11-12 MED ORDER — GABAPENTIN 300 MG PO CAPS
300.0000 mg | ORAL_CAPSULE | Freq: Two times a day (BID) | ORAL | Status: DC
  Administered 2017-11-12 – 2017-11-13 (×2): 300 mg via ORAL
  Filled 2017-11-12 (×2): qty 1

## 2017-11-12 MED ORDER — MEPERIDINE HCL 50 MG/ML IJ SOLN
25.0000 mg | Freq: Once | INTRAMUSCULAR | Status: AC
  Administered 2017-11-12: 25 mg via INTRAVENOUS

## 2017-11-12 MED ORDER — SODIUM CHLORIDE 0.9 % IV SOLN
INTRAVENOUS | Status: DC
  Administered 2017-11-12 – 2017-11-13 (×2): via INTRAVENOUS

## 2017-11-12 MED ORDER — BUPIVACAINE LIPOSOME 1.3 % IJ SUSP
20.0000 mL | Freq: Once | INTRAMUSCULAR | Status: DC
  Filled 2017-11-12: qty 20

## 2017-11-12 MED ORDER — OXYCODONE HCL 5 MG PO TABS
5.0000 mg | ORAL_TABLET | ORAL | Status: DC | PRN
  Administered 2017-11-12: 10 mg via ORAL
  Administered 2017-11-12: 5 mg via ORAL
  Administered 2017-11-13: 10 mg via ORAL
  Filled 2017-11-12 (×2): qty 2
  Filled 2017-11-12: qty 1

## 2017-11-12 MED ORDER — CLINDAMYCIN PHOSPHATE 900 MG/50ML IV SOLN
900.0000 mg | INTRAVENOUS | Status: AC
  Administered 2017-11-12: 900 mg via INTRAVENOUS

## 2017-11-12 SURGICAL SUPPLY — 36 items
APL SKNCLS STERI-STRIP NONHPOA (GAUZE/BANDAGES/DRESSINGS) ×1
BAG SPEC THK2 15X12 ZIP CLS (MISCELLANEOUS)
BAG ZIPLOCK 12X15 (MISCELLANEOUS) IMPLANT
BENZOIN TINCTURE PRP APPL 2/3 (GAUZE/BANDAGES/DRESSINGS) ×1 IMPLANT
BLADE SAW SGTL 18X1.27X75 (BLADE) ×2 IMPLANT
BNDG COHESIVE 6X5 TAN STRL LF (GAUZE/BANDAGES/DRESSINGS) IMPLANT
CAPT HIP TOTAL 2 ×1 IMPLANT
CELLS DAT CNTRL 66122 CELL SVR (MISCELLANEOUS) ×1 IMPLANT
COVER PERINEAL POST (MISCELLANEOUS) ×2 IMPLANT
COVER SURGICAL LIGHT HANDLE (MISCELLANEOUS) ×2 IMPLANT
DRAPE STERI IOBAN 125X83 (DRAPES) ×2 IMPLANT
DRAPE U-SHAPE 47X51 STRL (DRAPES) ×4 IMPLANT
DRSG AQUACEL AG ADV 3.5X10 (GAUZE/BANDAGES/DRESSINGS) ×2 IMPLANT
DURAPREP 26ML APPLICATOR (WOUND CARE) ×2 IMPLANT
ELECT REM PT RETURN 15FT ADLT (MISCELLANEOUS) ×2 IMPLANT
GAUZE XEROFORM 1X8 LF (GAUZE/BANDAGES/DRESSINGS) IMPLANT
GLOVE BIOGEL PI IND STRL 8 (GLOVE) ×2 IMPLANT
GLOVE BIOGEL PI INDICATOR 8 (GLOVE) ×2
GLOVE ECLIPSE 7.5 STRL STRAW (GLOVE) ×4 IMPLANT
GOWN STRL REUS W/TWL XL LVL3 (GOWN DISPOSABLE) ×4 IMPLANT
HOLDER FOLEY CATH W/STRAP (MISCELLANEOUS) ×2 IMPLANT
HOOD PEEL AWAY FLYTE STAYCOOL (MISCELLANEOUS) ×4 IMPLANT
PACK ANTERIOR HIP CUSTOM (KITS) ×2 IMPLANT
RETRACTOR WND ALEXIS 18 MED (MISCELLANEOUS) ×1 IMPLANT
RTRCTR WOUND ALEXIS 18CM MED (MISCELLANEOUS) ×2
STAPLER VISISTAT 35W (STAPLE) IMPLANT
STRIP CLOSURE SKIN 1/2X4 (GAUZE/BANDAGES/DRESSINGS) ×1 IMPLANT
SUT ETHIBOND NAB CT1 #1 30IN (SUTURE) ×4 IMPLANT
SUT MNCRL AB 3-0 PS2 18 (SUTURE) IMPLANT
SUT VIC AB 0 CT1 36 (SUTURE) ×2 IMPLANT
SUT VIC AB 1 CT1 36 (SUTURE) ×4 IMPLANT
SUT VIC AB 2-0 CT1 27 (SUTURE) ×4
SUT VIC AB 2-0 CT1 TAPERPNT 27 (SUTURE) ×2 IMPLANT
TRAY FOLEY CATH 14FRSI W/METER (CATHETERS) ×1 IMPLANT
TRAY FOLEY W/METER SILVER 16FR (SET/KITS/TRAYS/PACK) ×1 IMPLANT
YANKAUER SUCT BULB TIP NO VENT (SUCTIONS) ×2 IMPLANT

## 2017-11-12 NOTE — Transfer of Care (Signed)
Immediate Anesthesia Transfer of Care Note  Patient: Maria Simmons  Procedure(s) Performed: LEFT TOTAL HIP ARTHROPLASTY ANTERIOR APPROACH (Left Hip)  Patient Location: PACU  Anesthesia Type:Spinal  Level of Consciousness: awake, alert  and oriented  Airway & Oxygen Therapy: Patient Spontanous Breathing and Patient connected to face mask oxygen  Post-op Assessment: Report given to RN  Post vital signs: Reviewed and stable  Last Vitals:  Vitals:   11/12/17 0550  BP: 126/83  Pulse: 96  Resp: 18  Temp: 36.6 C  SpO2: 100%    Last Pain:  Vitals:   11/12/17 0550  TempSrc: Oral      Patients Stated Pain Goal: 4 (22/63/33 5456)  Complications: No apparent anesthesia complications

## 2017-11-12 NOTE — Anesthesia Postprocedure Evaluation (Addendum)
Anesthesia Post Note  Patient: Maria Simmons  Procedure(s) Performed: LEFT TOTAL HIP ARTHROPLASTY ANTERIOR APPROACH (Left Hip)     Patient location during evaluation: PACU Anesthesia Type: MAC and Spinal Level of consciousness: awake and alert Pain management: pain level controlled Vital Signs Assessment: post-procedure vital signs reviewed and stable Respiratory status: spontaneous breathing, nonlabored ventilation, respiratory function stable and patient connected to nasal cannula oxygen Cardiovascular status: blood pressure returned to baseline and stable Postop Assessment: no apparent nausea or vomiting and spinal receding Anesthetic complications: no    Last Vitals:  Vitals:   11/12/17 1444 11/12/17 1727  BP: 119/64 122/67  Pulse: 81 79  Resp: 16 15  Temp: 36.4 C 36.7 C  SpO2: 100% 100%    Last Pain:  Vitals:   11/12/17 1727  TempSrc: Oral  PainSc:                  Robie Mcniel

## 2017-11-12 NOTE — Discharge Instructions (Signed)

## 2017-11-12 NOTE — H&P (Signed)
TOTAL HIP ADMISSION H&P  Patient is admitted for left total hip arthroplasty.  Subjective:  Chief Complaint: left hip pain  HPI: Maria Simmons, 51 y.o. female, has a history of pain and functional disability in the left hip(s) due to arthritis and patient has failed non-surgical conservative treatments for greater than 12 weeks to include NSAID's and/or analgesics, flexibility and strengthening excercises and activity modification.  Onset of symptoms was gradual starting 3 years ago with gradually worsening course since that time.The patient noted no past surgery on the left hip(s).  Patient currently rates pain in the left hip at 9 out of 10 with activity. Patient has night pain, worsening of pain with activity and weight bearing, trendelenberg gait, pain that interfers with activities of daily living, pain with passive range of motion, crepitus and joint swelling. Patient has evidence of subchondral cysts, periarticular osteophytes, joint subluxation and joint space narrowing by imaging studies. This condition presents safety issues increasing the risk of falls. This patient has had failure of all reasonable conservative care.  There is no current active infection.  Patient Active Problem List   Diagnosis Date Noted  . H/O laparoscopic adjustable gastric banding 09/24/2017  . B12 deficiency anemia 09/24/2017  . Iron deficiency anemia 09/24/2017  . Left hip pain 09/24/2017  . Esophagitis 06/25/2016  . Stye 06/25/2016  . Change in bowel habits 03/19/2016  . History of B-cell lymphoma 11/18/2015  . Right shoulder pain 01/07/2015  . Anemia in chronic illness 11/15/2014  . Chronic leukopenia 11/15/2014  . Neuropathy due to chemotherapeutic drug ([place]) 11/15/2014  . History of lymphoma 10/19/2014  . 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  . Leucopenia 09/02/2012   Past Medical History:  Diagnosis Date  . Arthritis of shoulder region, right, degenerative    right shoulder   .  Bladder prolapse, female, acquired   . Cancer [facility]) 2007   thyroid, papillary  . Change in bowel habits 03/19/2016  . Constipation   . DLBCL (diffuse large B cell lymphoma) ([place])   . History of B-cell lymphoma 11/18/2015  . Hypothyroidism   . Neutropenic fever ([place]) 11/01/2014  . PONV (postoperative nausea and vomiting)    during hysterectomy   . S/P radiation therapy 02/05/15-02/27/15   tonsil/neck,lt subpectoral nodal bed 30.6Gy /17 fx  . Thyroid cancer ([place])   . Wears glasses     Past Surgical History:  Procedure Laterality Date  . ABDOMINAL HYSTERECTOMY  2000  . COLONOSCOPY    . COLONOSCOPY WITH PROPOFOL N/A 06/19/2016   Procedure: COLONOSCOPY WITH PROPOFOL;  Surgeon: Ronald Lobo, MD;  Location: [place];  Service: Endoscopy;  Laterality: N/A;  . ESOPHAGOGASTRODUODENOSCOPY (EGD) WITH PROPOFOL N/A 06/19/2016   Procedure: ESOPHAGOGASTRODUODENOSCOPY (EGD) WITH PROPOFOL;  Surgeon: Ronald Lobo, MD;  Location: [facility] ENDOSCOPY;  Service: Endoscopy;  Laterality: N/A;  . LAPAROSCOPIC GASTRIC BANDING WITH HIATAL HERNIA REPAIR  05/28/2009  . LYMPH NODE BIOPSY Left 10/12/2014   Procedure: excisional biopsy deep left neck lymph node;  Surgeon: Fanny Skates, MD;  Location: [place];  Service: General;  Laterality: Left;  . PORT-A-CATH REMOVAL N/A 10/18/2015   Procedure: REMOVAL PORT-A-CATH;  Surgeon: Fanny Skates, MD;  Location: [facility];  Service: General;  Laterality: N/A;  . PORTACATH PLACEMENT Right 10/24/2014   Procedure: INSERTION PORT-A-CATH WITH ULTRASOUND GUIDANCE;  Surgeon: Fanny Skates, MD;  Location: [place];  Service: General;  Laterality: Right;  . 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  . TUBAL LIGATION  1990    Current Facility-Administered Medications  Medication Dose Route Frequency Provider Last Rate Last Dose  . bupivacaine liposome (EXPAREL) 1.3 % injection 266 mg  20 mL  Infiltration Once Dorna Leitz, MD      . chlorhexidine (HIBICLENS) 4 % liquid 4 application  60 mL Topical Once Dorna Leitz, MD      . clindamycin (CLEOCIN) 900 MG/50ML IVPB           . clindamycin (CLEOCIN) IVPB 900 mg  900 mg Intravenous On Call to OR Dorna Leitz, MD      . tranexamic acid (CYKLOKAPRON) 1,000 mg in sodium chloride 0.9 % 100 mL IVPB  1,000 mg Intravenous To OR Ashrith Sagan, MD      . tranexamic acid (CYKLOKAPRON) 1,000 mg in sodium chloride 0.9 % 100 mL IVPB  1,000 mg Intravenous To OR Dorna Leitz, MD       Facility-Administered Medications Ordered in Other Encounters  Medication Dose Route Frequency Provider Last Rate Last Dose  . lactated ringers infusion   Intravenous Continuous PRN Key, Kristopher, CRNA       Allergies  Allergen Reactions  . Metformin And Related Rash    Fingers turned  Blue.  . Tape Itching    Surgical tape.  . Levaquin [Levofloxacin] Rash  . Penicillins Rash    Has patient had a PCN reaction causing immediate rash, facial/tongue/throat swelling, SOB or lightheadedness with hypotension: No Has patient had a PCN reaction causing severe rash involving mucus membranes or skin necrosis: No Has patient had a PCN reaction that required hospitalization No Has patient had a PCN reaction occurring within the last 10 years: No If all of the above answers are "NO", then may proceed with Cephalosporin use.     Social History   Tobacco Use  . Smoking status: Never Smoker  . Smokeless tobacco: Never Used  Substance Use Topics  . Alcohol use: No    Family History  Problem Relation Age of Onset  . Colon cancer Mother   . Cancer Mother        colon ca  . Cancer Father        prostate  . Hypertension Father   . Hyperlipidemia Father   . Stroke Father   . Prostate cancer Father   . Cancer Paternal Grandmother        female organ ca     ROS ROS: I have reviewed the patient's review of systems thoroughly and there are no positive responses as  relates to the HPI. Objective:  Physical Exam  Vital signs in last 24 hours: Temp:  [97.9 F (36.6 C)] 97.9 F (36.6 C) (11/30 0550) Pulse Rate:  [96] 96 (11/30 0550) Resp:  [18] 18 (11/30 0550) BP: (126)/(83) 126/83 (11/30 0550) SpO2:  [100 %] 100 % (11/30 0550) Weight:  [75.3 kg (166 lb)] 75.3 kg (166 lb) (11/30 0543) Well-developed well-nourished patient in no acute distress. Alert and oriented x3 HEENT:within normal limits Cardiac: Regular rate and rhythm Pulmonary: Lungs clear to auscultation Abdomen: Soft and nontender.  Normal active bowel sounds  Musculoskeletal: (l hip: limited rom painful rom limited IR NVI distally)  Labs: Recent Results (from the past 2160 hour(s))  CBC with Differential/Platelet     Status: Abnormal   Collection Time: 09/24/17  2:38 PM  Result Value Ref Range   WBC 3.0 (L) 3.9 - 10.3 10e3/uL   NEUT# 1.6 1.5 - 6.5 10e3/uL   HGB  11.3 (L) 11.6 - 15.9 g/dL   HCT 34.9 34.8 - 46.6 %   Platelets 260 145 - 400 10e3/uL   MCV 87.9 79.5 - 101.0 fL   MCH 28.5 25.1 - 34.0 pg   MCHC 32.4 31.5 - 36.0 g/dL   RBC 3.97 3.70 - 5.45 10e6/uL   RDW 13.6 11.2 - 14.5 %   lymph# 1.0 0.9 - 3.3 10e3/uL   MONO# 0.3 0.1 - 0.9 10e3/uL   Eosinophils Absolute 0.1 0.0 - 0.5 10e3/uL   Basophils Absolute 0.0 0.0 - 0.1 10e3/uL   NEUT% 53.6 38.4 - 76.8 %   LYMPH% 33.7 14.0 - 49.7 %   MONO% 9.4 0.0 - 14.0 %   EOS% 3.0 0.0 - 7.0 %   BASO% 0.3 0.0 - 2.0 %  Lactate dehydrogenase     Status: None   Collection Time: 09/24/17  2:38 PM  Result Value Ref Range   LDH 155 125 - 245 U/L  Comprehensive metabolic panel     Status: None   Collection Time: 09/24/17  2:38 PM  Result Value Ref Range   Sodium 142 136 - 145 mEq/L   Potassium 3.6 3.5 - 5.1 mEq/L   Chloride 105 98 - 109 mEq/L   CO2 28 22 - 29 mEq/L   Glucose 92 70 - 140 mg/dl    Comment: Glucose reference range is for nonfasting patients. Fasting glucose reference range is 70- 100.   BUN 12.6 7.0 - 26.0 mg/dL    Creatinine 0.9 0.6 - 1.1 mg/dL   Total Bilirubin 0.35 0.20 - 1.20 mg/dL   Alkaline Phosphatase 73 40 - 150 U/L   AST 15 5 - 34 U/L   ALT 8 0 - 55 U/L   Total Protein 6.7 6.4 - 8.3 g/dL   Albumin 3.5 3.5 - 5.0 g/dL   Calcium 9.0 8.4 - 10.4 mg/dL   Anion Gap 8 3 - 11 mEq/L   EGFR >60 >60 ml/min/1.73 m2    Comment: eGFR is calculated using the CKD-EPI Creatinine Equation (2009)  Iron and TIBC     Status: Abnormal   Collection Time: 09/24/17  4:10 PM  Result Value Ref Range   Iron 49 41 - 142 ug/dL   TIBC 349 236 - 444 ug/dL   UIBC 300 120 - 384 ug/dL   %SAT 14 (L) 21 - 57 %  Vitamin B12     Status: None   Collection Time: 09/24/17  4:10 PM  Result Value Ref Range   Vitamin B12 452 232 - 1,245 pg/mL  Glucose, capillary     Status: Abnormal   Collection Time: 10/06/17  7:03 AM  Result Value Ref Range   Glucose-Capillary 106 (H) 65 - 99 mg/dL  Urinalysis, Routine w reflex microscopic     Status: Abnormal   Collection Time: 11/02/17 10:16 AM  Result Value Ref Range   Color, Urine YELLOW YELLOW   APPearance CLEAR CLEAR   Specific Gravity, Urine 1.026 1.005 - 1.030   pH 5.0 5.0 - 8.0   Glucose, UA NEGATIVE NEGATIVE mg/dL   Hgb urine dipstick SMALL (A) NEGATIVE   Bilirubin Urine NEGATIVE NEGATIVE   Ketones, ur NEGATIVE NEGATIVE mg/dL   Protein, ur NEGATIVE NEGATIVE mg/dL   Nitrite NEGATIVE NEGATIVE   Leukocytes, UA SMALL (A) NEGATIVE   RBC / HPF 0-5 0 - 5 RBC/hpf   WBC, UA 0-5 0 - 5 WBC/hpf   Bacteria, UA FEW (A) NONE SEEN   Squamous Epithelial /  LPF 0-5 (A) NONE SEEN   Mucus PRESENT   Surgical pcr screen     Status: None   Collection Time: 11/02/17 10:18 AM  Result Value Ref Range   MRSA, PCR NEGATIVE NEGATIVE   Staphylococcus aureus NEGATIVE NEGATIVE    Comment: (NOTE) The Xpert SA Assay (FDA approved for NASAL specimens in patients 29 years of age and older), is one component of a comprehensive surveillance program. It is not intended to diagnose infection nor to guide  or monitor treatment.   APTT     Status: None   Collection Time: 11/02/17 11:35 AM  Result Value Ref Range   aPTT 27 24 - 36 seconds  CBC WITH DIFFERENTIAL     Status: Abnormal   Collection Time: 11/02/17 11:35 AM  Result Value Ref Range   WBC 2.5 (L) 4.0 - 10.5 K/uL   RBC 4.13 3.87 - 5.11 MIL/uL   Hemoglobin 11.9 (L) 12.0 - 15.0 g/dL   HCT 36.4 36.0 - 46.0 %   MCV 88.1 78.0 - 100.0 fL   MCH 28.8 26.0 - 34.0 pg   MCHC 32.7 30.0 - 36.0 g/dL   RDW 13.1 11.5 - 15.5 %   Platelets 269 150 - 400 K/uL   Neutrophils Relative % 46 %   Neutro Abs 1.2 (L) 1.7 - 7.7 K/uL   Lymphocytes Relative 40 %   Lymphs Abs 1.0 0.7 - 4.0 K/uL   Monocytes Relative 11 %   Monocytes Absolute 0.3 0.1 - 1.0 K/uL   Eosinophils Relative 3 %   Eosinophils Absolute 0.1 0.0 - 0.7 K/uL   Basophils Relative 0 %   Basophils Absolute 0.0 0.0 - 0.1 K/uL  Comprehensive metabolic panel     Status: Abnormal   Collection Time: 11/02/17 11:35 AM  Result Value Ref Range   Sodium 142 135 - 145 mmol/L   Potassium 4.3 3.5 - 5.1 mmol/L   Chloride 104 101 - 111 mmol/L   CO2 30 22 - 32 mmol/L   Glucose, Bld 111 (H) 65 - 99 mg/dL   BUN 15 6 - 20 mg/dL   Creatinine, Ser 0.90 0.44 - 1.00 mg/dL   Calcium 9.2 8.9 - 10.3 mg/dL   Total Protein 7.1 6.5 - 8.1 g/dL   Albumin 3.8 3.5 - 5.0 g/dL   AST 19 15 - 41 U/L   ALT 11 (L) 14 - 54 U/L   Alkaline Phosphatase 68 38 - 126 U/L   Total Bilirubin 0.6 0.3 - 1.2 mg/dL   GFR calc non Af Amer >60 >60 mL/min   GFR calc Af Amer >60 >60 mL/min    Comment: (NOTE) The eGFR has been calculated using the CKD EPI equation. This calculation has not been validated in all clinical situations. eGFR's persistently <60 mL/min signify possible Chronic Kidney Disease.    Anion gap 8 5 - 15  Protime-INR     Status: None   Collection Time: 11/02/17 11:35 AM  Result Value Ref Range   Prothrombin Time 12.4 11.4 - 15.2 seconds   INR 0.94   Type and screen Order type and screen if day of surgery  is less than 15 days from draw of preadmission visit or order morning of surgery if day of surgery is greater than 6 days from preadmission visit.     Status: None   Collection Time: 11/02/17 11:35 AM  Result Value Ref Range   ABO/RH(D) B POS    Antibody Screen NEG    Sample  Expiration 11/15/2017    Extend sample reason NO TRANSFUSIONS OR PREGNANCY IN THE PAST 3 MONTHS   ABO/Rh     Status: None   Collection Time: 11/02/17 11:35 AM  Result Value Ref Range   ABO/RH(D) B POS      Estimated body mass index is 28.49 kg/m as calculated from the following:   Height as of this encounter: 5' 4"  (1.626 m).   Weight as of this encounter: 75.3 kg (166 lb).   Imaging Review Plain radiographs demonstrate severe degenerative joint disease of the left hip(s). The bone quality appears to be fair for age and reported activity level.  Assessment/Plan:  End stage arthritis, left hip(s)  The patient history, physical examination, clinical judgement of the provider and imaging studies are consistent with end stage degenerative joint disease of the left hip(s) and total hip arthroplasty is deemed medically necessary. The treatment options including medical management, injection therapy, arthroscopy and arthroplasty were discussed at length. The risks and benefits of total hip arthroplasty were presented and reviewed. The risks due to aseptic loosening, infection, stiffness, dislocation/subluxation,  thromboembolic complications and other imponderables were discussed.  The patient acknowledged the explanation, agreed to proceed with the plan and consent was signed. Patient is being admitted for inpatient treatment for surgery, pain control, PT, OT, prophylactic antibiotics, VTE prophylaxis, progressive ambulation and ADL's and discharge planning.The patient is planning to be discharged home with home health services

## 2017-11-12 NOTE — Brief Op Note (Signed)
11/12/2017  9:07 AM  PATIENT:  Mallie Darting  51 y.o. female  PRE-OPERATIVE DIAGNOSIS:  OSTEOARTHRITIS LEFT HIP  POST-OPERATIVE DIAGNOSIS:  OSTEOARTHRITIS LEFT HIP  PROCEDURE:  Procedure(s): LEFT TOTAL HIP ARTHROPLASTY ANTERIOR APPROACH (Left)  SURGEON:  Surgeon(s) and Role:    Dorna Leitz, MD - Primary  PHYSICIAN ASSISTANT:   ASSISTANTS: bethune   ANESTHESIA:   spinal  EBL:  200 mL   BLOOD ADMINISTERED:none  DRAINS: none   LOCAL MEDICATIONS USED:  MARCAINE    and OTHER experel  SPECIMEN:  No Specimen  DISPOSITION OF SPECIMEN:  N/A  COUNTS:  YES  TOURNIQUET:  * No tourniquets in log *  DICTATION: .Other Dictation: Dictation Number [id number]  PLAN OF CARE: Admit to inpatient   PATIENT DISPOSITION:  PACU - hemodynamically stable.   Delay start of Pharmacological VTE agent (>24hrs) due to surgical blood loss or risk of bleeding: no

## 2017-11-12 NOTE — Evaluation (Signed)
Physical Therapy Evaluation Patient Details Name: Maria Simmons MRN: [id number] DOB: [birth year] Today's Date: 11/12/2017   History of Present Illness  Pt s/p L THR   Clinical Impression  Pt s/p L THR and presents with decreased L LE strength/ROM and post op pain limiting functional mobility.  Pt should progress to dc home with family assist    Follow Up Recommendations Home health PT;DC plan and follow up therapy as arranged by surgeon    Equipment Recommendations  Rolling walker with 5" wheels    Recommendations for Other Services       Precautions / Restrictions Precautions Precautions: Fall Restrictions Weight Bearing Restrictions: No      Mobility  Bed Mobility Overal bed mobility: Needs Assistance Bed Mobility: Supine to Sit     Supine to sit: Min assist     General bed mobility comments: cues for sequence and use of R LE to self assist  Transfers Overall transfer level: Needs assistance Equipment used: Rolling walker (2 wheeled) Transfers: Sit to/from Stand Sit to Stand: Min assist;Mod assist         General transfer comment: cues for LE management and use of UEs to self assist  Ambulation/Gait Ambulation/Gait assistance: Min assist Ambulation Distance (Feet): 80 Feet Assistive device: Rolling walker (2 wheeled) Gait Pattern/deviations: Step-to pattern;Step-through pattern;Decreased step length - right;Decreased step length - left;Shuffle;Trunk flexed Gait velocity: decr Gait velocity interpretation: Below normal speed for age/gender General Gait Details: cues for posture, position from RW and initial sequence  Stairs            Wheelchair Mobility    Modified Rankin (Stroke Patients Only)       Balance                                             Pertinent Vitals/Pain Pain Assessment: 0-10 Pain Score: 4  Pain Location: L hip Pain Descriptors / Indicators: Aching;Sore Pain Intervention(s): Limited activity  within patient's tolerance;Monitored during session;Premedicated before session;Ice applied    Home Living Family/patient expects to be discharged to:: Private residence Living Arrangements: Alone Available Help at Discharge: Family Type of Home: House Home Access: Level entry     Home Layout: One level Home Equipment: Environmental consultant - 4 wheels      Prior Function Level of Independence: Independent               Hand Dominance        Extremity/Trunk Assessment   Upper Extremity Assessment Upper Extremity Assessment: Overall WFL for tasks assessed    Lower Extremity Assessment Lower Extremity Assessment: LLE deficits/detail    Cervical / Trunk Assessment Cervical / Trunk Assessment: Normal  Communication   Communication: No difficulties  Cognition Arousal/Alertness: Awake/alert Behavior During Therapy: WFL for tasks assessed/performed Overall Cognitive Status: Within Functional Limits for tasks assessed                                        General Comments      Exercises Total Joint Exercises Ankle Circles/Pumps: AROM;Both;20 reps;Supine   Assessment/Plan    PT Assessment Patient needs continued PT services  PT Problem List Decreased strength;Decreased range of motion;Decreased activity tolerance;Decreased mobility;Decreased knowledge of use of DME;Pain       PT Treatment Interventions DME  instruction;Gait training;Stair training;Functional mobility training;Therapeutic activities;Therapeutic exercise;Patient/family education    PT Goals (Current goals can be found in the Care Plan section)  Acute Rehab PT Goals Patient Stated Goal: Regain IND and walk with less pain PT Goal Formulation: With patient Time For Goal Achievement: 11/16/17 Potential to Achieve Goals: Good    Frequency 7X/week   Barriers to discharge        Co-evaluation               AM-PAC PT "6 Clicks" Daily Activity  Outcome Measure Difficulty turning over  in bed (including adjusting bedclothes, sheets and blankets)?: Unable Difficulty moving from lying on back to sitting on the side of the bed? : Unable Difficulty sitting down on and standing up from a chair with arms (e.g., wheelchair, bedside commode, etc,.)?: Unable Help needed moving to and from a bed to chair (including a wheelchair)?: A Little Help needed walking in hospital room?: A Little Help needed climbing 3-5 steps with a railing? : A Little 6 Click Score: 12    End of Session Equipment Utilized During Treatment: Gait belt Activity Tolerance: Patient tolerated treatment well Patient left: in chair;with call bell/phone within reach;with family/visitor present Nurse Communication: Mobility status PT Visit Diagnosis: Difficulty in walking, not elsewhere classified (R26.2)    Time: 8110-3159 PT Time Calculation (min) (ACUTE ONLY): 23 min   Charges:   PT Evaluation $PT Eval Low Complexity: 1 Low PT Treatments $Gait Training: 8-22 mins   PT G Codes:        Pg [phone]   Verle Wheeling 11/12/2017, 6:23 PM

## 2017-11-12 NOTE — Anesthesia Preprocedure Evaluation (Signed)
Anesthesia Evaluation  Patient identified by MRN, date of birth, ID band Patient awake    Reviewed: Allergy & Precautions, NPO status , Patient's Chart, lab work & pertinent test results  History of Anesthesia Complications (+) PONV and history of anesthetic complications  Airway Mallampati: II  TM Distance: >3 FB Neck ROM: Full    Dental  (+) Teeth Intact   Pulmonary neg pulmonary ROS,    breath sounds clear to auscultation       Cardiovascular negative cardio ROS   Rhythm:Regular     Neuro/Psych  Neuromuscular disease negative psych ROS   GI/Hepatic negative GI ROS, Neg liver ROS,   Endo/Other  Hypothyroidism   Renal/GU      Musculoskeletal  (+) Arthritis ,   Abdominal   Peds  Hematology  (+) anemia ,   Anesthesia Other Findings   Reproductive/Obstetrics                             Anesthesia Physical Anesthesia Plan  ASA: II  Anesthesia Plan: MAC and Spinal   Post-op Pain Management:    Induction: Intravenous  PONV Risk Score and Plan: 3 and Ondansetron  Airway Management Planned: Nasal Cannula  Additional Equipment:   Intra-op Plan:   Post-operative Plan:   Informed Consent: I have reviewed the patients History and Physical, chart, labs and discussed the procedure including the risks, benefits and alternatives for the proposed anesthesia with the patient or authorized representative who has indicated his/her understanding and acceptance.   Dental advisory given  Plan Discussed with: CRNA and Surgeon  Anesthesia Plan Comments:         Anesthesia Quick Evaluation

## 2017-11-12 NOTE — Anesthesia Procedure Notes (Signed)
Spinal  Patient location during procedure: OR Start time: 11/12/2017 7:25 AM End time: 11/12/2017 7:35 AM Staffing Resident/CRNA: Lavina Hamman, CRNA Performed: resident/CRNA  Preanesthetic Checklist Completed: patient identified, site marked, surgical consent, pre-op evaluation, timeout performed, IV checked, risks and benefits discussed and monitors and equipment checked Spinal Block Patient position: sitting Prep: Betadine Patient monitoring: heart rate, cardiac monitor, continuous pulse ox and blood pressure Approach: midline Location: L3-4 Injection technique: single-shot Needle Needle type: Pencan  Needle gauge: 24 G Needle length: 9 cm Needle insertion depth: 7 cm Assessment Sensory level: T6 Additional Notes -heme, -para, VSS.  Pt tolerated well.  Lot and expiration date OK.

## 2017-11-13 LAB — CBC
HCT: 32 % — ABNORMAL LOW (ref 36.0–46.0)
HEMOGLOBIN: 10.6 g/dL — AB (ref 12.0–15.0)
MCH: 28.7 pg (ref 26.0–34.0)
MCHC: 33.1 g/dL (ref 30.0–36.0)
MCV: 86.7 fL (ref 78.0–100.0)
Platelets: 251 10*3/uL (ref 150–400)
RBC: 3.69 MIL/uL — ABNORMAL LOW (ref 3.87–5.11)
RDW: 13.1 % (ref 11.5–15.5)
WBC: 10.9 10*3/uL — ABNORMAL HIGH (ref 4.0–10.5)

## 2017-11-13 LAB — BASIC METABOLIC PANEL
Anion gap: 5 (ref 5–15)
BUN: 12 mg/dL (ref 6–20)
CALCIUM: 8.4 mg/dL — AB (ref 8.9–10.3)
CHLORIDE: 103 mmol/L (ref 101–111)
CO2: 28 mmol/L (ref 22–32)
CREATININE: 0.86 mg/dL (ref 0.44–1.00)
GFR calc non Af Amer: >60 mL/min (ref >60)
Glucose, Bld: 176 mg/dL — ABNORMAL HIGH (ref 65–99)
Potassium: 4.3 mmol/L (ref 3.5–5.1)
SODIUM: 136 mmol/L (ref 135–145)

## 2017-11-13 NOTE — Plan of Care (Signed)
Plan of care reviewed with patient and family.  Questions answered to patient's satisfaction. 

## 2017-11-13 NOTE — Progress Notes (Signed)
   PATIENT ID: Maria Simmons   1 Day Post-Op Procedure(s) (LRB): LEFT TOTAL HIP ARTHROPLASTY ANTERIOR APPROACH (Left)  Subjective:Doing well, reports minimal pain. Got up w PT last night and tolerated that well. Feels ready to go home today when cleared by PT.   Objective:  Vitals:   11/13/17 0115 11/13/17 0544  BP: 101/64 109/68  Pulse: 62 64  Resp: 15 16  Temp: 97.7 F (36.5 C) 97.6 F (36.4 C)  SpO2: 100% 100%     Ant L hip dressing c/d/i Wiggles toes, distally NVI  Labs:  Recent Labs    11/13/17 0529  HGB 10.6*   Recent Labs    11/13/17 0529  WBC 10.9*  RBC 3.69*  HCT 32.0*  PLT 251   Recent Labs    11/13/17 0529  NA 136  K 4.3  CL 103  CO2 28  BUN 12  CREATININE 0.86  GLUCOSE 176*  CALCIUM 8.4*    Assessment and Plan: 1 day s/p L ant THA Up with PT Cont pain mgmt D/c home today when cleared by PT Scripts in chart Fu w Dr. Berenice Primas in 2 weeks  VTE proph: asa, scds

## 2017-11-13 NOTE — Discharge Summary (Signed)
Patient ID: Maria Simmons MRN: [id number] DOB/AGE: [birth year] 51 y.o.  Admit date: 11/12/2017 Discharge date: 11/13/2017  Admission Diagnoses:  Principal Problem:   Primary osteoarthritis of left hip   Discharge Diagnoses:  Same  Past Medical History:  Diagnosis Date  . Arthritis of shoulder region, right, degenerative    right shoulder   . Bladder prolapse, female, acquired   . 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 Change in bowel habits 03/19/2016  . Constipation   . DLBCL (diffuse large B cell lymphoma) ([place])   . History of B-cell lymphoma 11/18/2015  . Hypothyroidism   . Neutropenic fever ([place]) 11/01/2014  . PONV (postoperative nausea and vomiting)    during hysterectomy   . S/P radiation therapy 02/05/15-02/27/15   tonsil/neck,lt subpectoral nodal bed 30.6Gy /17 fx  . Thyroid cancer ([place])   . Wears glasses     Surgeries: Procedure(s): LEFT TOTAL HIP ARTHROPLASTY ANTERIOR APPROACH on 11/12/2017   Consultants:   Discharged Condition: Improved  Hospital Course: Maria Simmons is an 51 y.o. female who was admitted 11/12/2017 for operative treatment ofPrimary osteoarthritis of left hip. Patient has severe unremitting pain that affects sleep, daily activities, and work/hobbies. After pre-op clearance the patient was taken to the operating room on 11/12/2017 and underwent  Procedure(s): LEFT TOTAL HIP ARTHROPLASTY ANTERIOR APPROACH.    Patient was given perioperative antibiotics:  Anti-infectives (From admission, onward)   Start     Dose/Rate Route Frequency Ordered Stop   11/12/17 1600  clindamycin (CLEOCIN) IVPB 600 mg     600 mg 100 mL/hr over 30 Minutes Intravenous Every 8 hours 11/12/17 1159 11/13/17 2159   11/12/17 0642  clindamycin (CLEOCIN) 900 MG/50ML IVPB    Comments:  Key, Kristopher   : cabinet override      11/12/17 0642 11/12/17 0738   11/12/17 0529  clindamycin (CLEOCIN) IVPB 900 mg     900 mg 100 mL/hr over 30 Minutes Intravenous On call to  O.R. 11/12/17 0529 11/12/17 0747       Patient was given sequential compression devices, early ambulation, and asa to prevent DVT.  Patient benefited maximally from hospital stay and there were no complications.    Recent vital signs:  Patient Vitals for the past 24 hrs:  BP Temp Temp src Pulse Resp SpO2  11/13/17 0544 109/68 97.6 F (36.4 C) Oral 64 16 100 %  11/13/17 0115 101/64 97.7 F (36.5 C) Oral 62 15 100 %  11/12/17 2122 109/67 98 F (36.7 C) Oral 73 14 99 %  11/12/17 1727 122/67 98 F (36.7 C) Oral 79 15 100 %  11/12/17 1444 119/64 97.6 F (36.4 C) Oral 81 16 100 %  11/12/17 1342 (!) 101/51 97.8 F (36.6 C) Oral 89 15 100 %  11/12/17 1245 120/64 97.7 F (36.5 C) Oral 65 15 100 %  11/12/17 1145 123/60 97.6 F (36.4 C) - 69 16 100 %  11/12/17 1130 108/65 97.6 F (36.4 C) - 62 14 100 %  11/12/17 1115 114/65 - - 60 14 100 %  11/12/17 1100 108/66 - - 60 13 100 %  11/12/17 1045 (!) 122/56 - - 71 18 100 %  11/12/17 1015 (!) 100/57 - - 62 13 100 %  11/12/17 1000 (!) 103/54 - - 79 15 100 %  11/12/17 0950 101/83 - - 77 18 100 %  11/12/17 0945 (!) 84/63 - - 67 (!) 21 100 %  11/12/17 0935 (!) 90/59 - - 73  13 100 %  11/12/17 0930 (!) 89/59 (!) 97.5 F (36.4 C) - 91 16 100 %     Recent laboratory studies:  Recent Labs    11/13/17 0529  WBC 10.9*  HGB 10.6*  HCT 32.0*  PLT 251  NA 136  K 4.3  CL 103  CO2 28  BUN 12  CREATININE 0.86  GLUCOSE 176*  CALCIUM 8.4*     Discharge Medications:   Allergies as of 11/13/2017      Reactions   Metformin And Related Rash   Fingers turned  Blue.   Tape Itching   Surgical tape.   Levaquin [levofloxacin] Rash   Penicillins Rash   Has patient had a PCN reaction causing immediate rash, facial/tongue/throat swelling, SOB or lightheadedness with hypotension: No Has patient had a PCN reaction causing severe rash involving mucus membranes or skin necrosis: No Has patient had a PCN reaction that required hospitalization  No Has patient had a PCN reaction occurring within the last 10 years: No If all of the above answers are "NO", then may proceed with Cephalosporin use.      Medication List    TAKE these medications   aspirin EC 325 MG tablet Take 1 tablet (325 mg total) by mouth 2 (two) times daily after a meal. Take x 1 month post op to decrease risk of blood clots.   Biotin 5000 MCG Tabs Take 1 tablet by mouth every morning. Reported on 02/18/2016   cyanocobalamin 500 MCG tablet Take 500 mcg by mouth daily.   docusate sodium 100 MG capsule Commonly known as:  COLACE Take 1 capsule (100 mg total) by mouth 2 (two) times daily.   levothyroxine 125 MCG tablet Commonly known as:  SYNTHROID, LEVOTHROID Take 125 mcg by mouth daily before breakfast.   multivitamin with minerals Tabs tablet Take 1 tablet by mouth every morning.   oxyCODONE-acetaminophen 5-325 MG tablet Commonly known as:  PERCOCET/ROXICET Take 1-2 tablets by mouth every 6 (six) hours as needed for severe pain.   tiZANidine 2 MG tablet Commonly known as:  ZANAFLEX Take 1 tablet (2 mg total) by mouth every 8 (eight) hours as needed for muscle spasms.   Vitamin D (Cholecalciferol) 1000 units Tabs Take 1,000 Units by mouth daily at 12 noon.       Diagnostic Studies: Dg C-arm 1-60 Min-no Report  Result Date: 11/12/2017 Fluoroscopy was utilized by the requesting physician.  No radiographic interpretation.   Dg Hip Operative Unilat W Or W/o Pelvis Left  Result Date: 11/12/2017 CLINICAL DATA:  Left hip replacement. EXAM: OPERATIVE LEFT HIP (WITH PELVIS IF PERFORMED) 3 VIEWS TECHNIQUE: Fluoroscopic spot image(s) were submitted for interpretation post-operatively. COMPARISON:  No recent. FINDINGS: Total left hip replacement. Hardware intact. Anatomic alignment. No acute bony abnormality. IMPRESSION: Total left hip replacement.  Anatomic alignment . Electronically Signed   By: Marcello Moores  Register   On: 11/12/2017 10:05    Disposition:  01-Home or Self Care  Discharge Instructions    Call MD / Call 911   Complete by:  As directed    If you experience chest pain or shortness of breath, CALL 911 and be transported to the hospital emergency room.  If you develope a fever above 101 F, pus (white drainage) or increased drainage or redness at the wound, or calf pain, call your surgeon's office.   Constipation Prevention   Complete by:  As directed    Drink plenty of fluids.  Prune juice may be helpful.  You may use a stool softener, such as Colace (over the counter) 100 mg twice a day.  Use MiraLax (over the counter) for constipation as needed.   Diet - low sodium heart healthy   Complete by:  As directed    Increase activity slowly as tolerated   Complete by:  As directed       Follow-up Information    Dorna Leitz, MD. Schedule an appointment as soon as possible for a visit in 2 weeks.   Specialty:  Orthopedic Surgery Contact information: [place] [address] [phone]            Signed: Grier Mitts 11/13/2017, 8:59 AM

## 2017-11-13 NOTE — Progress Notes (Signed)
Discharged from floor via w/c for transport home by car. Belongings & family with pt. No changes in assessment. Madysen Faircloth  

## 2017-11-13 NOTE — Progress Notes (Signed)
Physical Therapy Treatment Patient Details Name: EDMONIA GONSER MRN: [id number] DOB: 1966/05/05 Today's Date: 11/13/2017    History of Present Illness Pt s/p L THR     PT Comments    Reviewed home therex program with progression and written instruction.  Reviewed car transfers, LE dressing, and shower transfers.  Pt dtr present.   Follow Up Recommendations  Home health PT;DC plan and follow up therapy as arranged by surgeon     Equipment Recommendations  Rolling walker with 5" wheels    Recommendations for Other Services       Precautions / Restrictions Precautions Precautions: Fall Restrictions Weight Bearing Restrictions: No    Mobility  Bed Mobility Overal bed mobility: Needs Assistance Bed Mobility: Supine to Sit     Supine to sit: Supervision     General bed mobility comments: cues for sequence and use of R LE to self assist  Transfers Overall transfer level: Needs assistance Equipment used: Rolling walker (2 wheeled) Transfers: Sit to/from Stand Sit to Stand: Min guard;Supervision         General transfer comment: cues for LE management and use of UEs to self assist  Ambulation/Gait Ambulation/Gait assistance: Min guard;Supervision Ambulation Distance (Feet): 280 Feet Assistive device: Rolling walker (2 wheeled) Gait Pattern/deviations: Step-to pattern;Step-through pattern;Decreased step length - right;Decreased step length - left;Shuffle;Trunk flexed Gait velocity: decr Gait velocity interpretation: Below normal speed for age/gender General Gait Details: min cues for posture, position from RW and initial sequence   Stairs            Wheelchair Mobility    Modified Rankin (Stroke Patients Only)       Balance                                            Cognition Arousal/Alertness: Awake/alert Behavior During Therapy: WFL for tasks assessed/performed Overall Cognitive Status: Within Functional Limits for tasks  assessed                                        Exercises Total Joint Exercises Ankle Circles/Pumps: AROM;Both;20 reps;Supine Quad Sets: AROM;Both;10 reps;Supine Heel Slides: AAROM;Left;20 reps;Supine Hip ABduction/ADduction: AAROM;Left;15 reps;Supine Long Arc Quad: AROM;Left;10 reps;Seated    General Comments        Pertinent Vitals/Pain Pain Assessment: 0-10 Pain Score: 3  Pain Location: L hip Pain Descriptors / Indicators: Aching;Sore Pain Intervention(s): Limited activity within patient's tolerance;Monitored during session;Premedicated before session;Ice applied    Home Living                      Prior Function            PT Goals (current goals can now be found in the care plan section) Acute Rehab PT Goals Patient Stated Goal: Regain IND and walk with less pain PT Goal Formulation: With patient Time For Goal Achievement: 11/16/17 Potential to Achieve Goals: Good Progress towards PT goals: Progressing toward goals    Frequency    7X/week      PT Plan Current plan remains appropriate    Co-evaluation              AM-PAC PT "6 Clicks" Daily Activity  Outcome Measure  Difficulty turning over in bed (including adjusting bedclothes, sheets and blankets)?:  A Lot Difficulty moving from lying on back to sitting on the side of the bed? : A Lot Difficulty sitting down on and standing up from a chair with arms (e.g., wheelchair, bedside commode, etc,.)?: A Lot Help needed moving to and from a bed to chair (including a wheelchair)?: A Little Help needed walking in hospital room?: A Little Help needed climbing 3-5 steps with a railing? : A Little 6 Click Score: 15    End of Session Equipment Utilized During Treatment: Gait belt Activity Tolerance: Patient tolerated treatment well Patient left: in chair;with call bell/phone within reach;with family/visitor present Nurse Communication: Mobility status PT Visit Diagnosis:  Difficulty in walking, not elsewhere classified (R26.2)     Time: 4431-5400 PT Time Calculation (min) (ACUTE ONLY): 29 min  Charges:  $Gait Training: 8-22 mins $Therapeutic Exercise: 8-22 mins $Therapeutic Activity: 8-22 mins                    G Codes:       QQ [phone]    Pieter Fooks 11/13/2017, 12:33 PM

## 2017-11-13 NOTE — Progress Notes (Addendum)
Pt will not be at the address in the facesheet. Instead, after discharge she will be at [address] [place], [place], [address]. Arion Morgan, [employer]

## 2017-11-13 NOTE — Care Management Note (Signed)
Case Management Note  Patient Details  Name: ATZIRI ZUBIATE MRN: [id number] Date of Birth: 01-15-1966  Subjective/Objective:   Left THA                 Action/Plan: Discharge Planning: NCM spoke to pt and offered choice for HH/list provided. Pt requested RW for home. [facility] will deliver to room prior to dc. Contacted [facility] with new referral for [facility]. Pt will dc to her family's [facility], [address] [place], [place], [address]. Made [facility] aware of new address.     PCP COUSINS, Alanda Slim  Expected Discharge Date:  11/13/17               Expected Discharge Plan:  [place]  In-House Referral:  NA  Discharge planning Services  CM Consult  Post Acute Care Choice:  Home Health Choice offered to:  Patient  DME Arranged:  Walker rolling DME Agency:  [place]:  PT [facility] Agency:  [place]  Status of Service:  Completed, signed off  If discussed at [place] of Stay Meetings, dates discussed:    Additional Comments:  Erenest Rasher, RN 11/13/2017, 12:00 PM

## 2017-11-13 NOTE — Progress Notes (Signed)
Physical Therapy Treatment Patient Details Name: Maria Simmons MRN: [id number] DOB: February 27, 1966 Today's Date: 11/13/2017    History of Present Illness Pt s/p L THR     PT Comments    Pt progressing well with mobility and eager for dc home.  Will review home therex with dtr at next session.   Follow Up Recommendations  Home health PT;DC plan and follow up therapy as arranged by surgeon     Equipment Recommendations  Rolling walker with 5" wheels    Recommendations for Other Services       Precautions / Restrictions Precautions Precautions: Fall Restrictions Weight Bearing Restrictions: No    Mobility  Bed Mobility Overal bed mobility: Needs Assistance Bed Mobility: Supine to Sit     Supine to sit: Supervision     General bed mobility comments: cues for sequence and use of R LE to self assist  Transfers Overall transfer level: Needs assistance Equipment used: Rolling walker (2 wheeled) Transfers: Sit to/from Stand Sit to Stand: Min guard;Supervision         General transfer comment: cues for LE management and use of UEs to self assist  Ambulation/Gait Ambulation/Gait assistance: Min guard;Supervision Ambulation Distance (Feet): 280 Feet Assistive device: Rolling walker (2 wheeled) Gait Pattern/deviations: Step-to pattern;Step-through pattern;Decreased step length - right;Decreased step length - left;Shuffle;Trunk flexed Gait velocity: decr Gait velocity interpretation: Below normal speed for age/gender General Gait Details: min cues for posture, position from RW and initial sequence   Stairs            Wheelchair Mobility    Modified Rankin (Stroke Patients Only)       Balance                                            Cognition Arousal/Alertness: Awake/alert Behavior During Therapy: WFL for tasks assessed/performed Overall Cognitive Status: Within Functional Limits for tasks assessed                                         Exercises Total Joint Exercises Ankle Circles/Pumps: AROM;Both;20 reps;Supine Quad Sets: AROM;Both;10 reps;Supine Heel Slides: AAROM;Left;20 reps;Supine Hip ABduction/ADduction: AAROM;Left;15 reps;Supine    General Comments        Pertinent Vitals/Pain Pain Assessment: 0-10 Pain Score: 4  Pain Location: L hip Pain Descriptors / Indicators: Aching;Sore Pain Intervention(s): Limited activity within patient's tolerance;Monitored during session;Premedicated before session;Ice applied    Home Living                      Prior Function            PT Goals (current goals can now be found in the care plan section) Acute Rehab PT Goals Patient Stated Goal: Regain IND and walk with less pain PT Goal Formulation: With patient Time For Goal Achievement: 11/16/17 Potential to Achieve Goals: Good Progress towards PT goals: Progressing toward goals    Frequency    7X/week      PT Plan Current plan remains appropriate    Co-evaluation              AM-PAC PT "6 Clicks" Daily Activity  Outcome Measure  Difficulty turning over in bed (including adjusting bedclothes, sheets and blankets)?: A Lot Difficulty moving from lying on  back to sitting on the side of the bed? : A Lot Difficulty sitting down on and standing up from a chair with arms (e.g., wheelchair, bedside commode, etc,.)?: A Lot Help needed moving to and from a bed to chair (including a wheelchair)?: A Little Help needed walking in hospital room?: A Little Help needed climbing 3-5 steps with a railing? : A Little 6 Click Score: 15    End of Session Equipment Utilized During Treatment: Gait belt Activity Tolerance: Patient tolerated treatment well Patient left: in chair;with call bell/phone within reach;with family/visitor present Nurse Communication: Mobility status PT Visit Diagnosis: Difficulty in walking, not elsewhere classified (R26.2)     Time: 6010-9323 PT Time  Calculation (min) (ACUTE ONLY): 27 min  Charges:  $Gait Training: 8-22 mins $Therapeutic Exercise: 8-22 mins                    G Codes:       Pg [phone]    Momodou Consiglio 11/13/2017, 12:29 PM

## 2017-11-14 DIAGNOSIS — Z85850 Personal history of malignant neoplasm of thyroid: Secondary | ICD-10-CM | POA: Diagnosis not present

## 2017-11-14 DIAGNOSIS — C8330 Diffuse large B-cell lymphoma, unspecified site: Secondary | ICD-10-CM | POA: Diagnosis not present

## 2017-11-14 DIAGNOSIS — Z96642 Presence of left artificial hip joint: Secondary | ICD-10-CM | POA: Diagnosis not present

## 2017-11-14 DIAGNOSIS — E039 Hypothyroidism, unspecified: Secondary | ICD-10-CM | POA: Diagnosis not present

## 2017-11-14 DIAGNOSIS — Z471 Aftercare following joint replacement surgery: Secondary | ICD-10-CM | POA: Diagnosis not present

## 2017-11-14 DIAGNOSIS — Z79891 Long term (current) use of opiate analgesic: Secondary | ICD-10-CM | POA: Diagnosis not present

## 2017-11-14 DIAGNOSIS — M19011 Primary osteoarthritis, right shoulder: Secondary | ICD-10-CM | POA: Diagnosis not present

## 2017-11-14 DIAGNOSIS — Z7982 Long term (current) use of aspirin: Secondary | ICD-10-CM | POA: Diagnosis not present

## 2017-11-15 ENCOUNTER — Encounter (HOSPITAL_COMMUNITY): Payer: Self-pay | Admitting: Orthopedic Surgery

## 2017-11-15 NOTE — Op Note (Signed)
NAME:  Maria Simmons, Maria Simmons                 ACCOUNT NO.:  MEDICAL RECORD NO.:  [id number]  LOCATION:                                 FACILITY:  PHYSICIAN:  Alta Corning, M.D.   DATE OF BIRTH:  [birth year]  DATE OF PROCEDURE:  11/12/2017 DATE OF DISCHARGE:                              OPERATIVE REPORT   POSTOPERATIVE DIAGNOSIS:  End-stage degenerative joint disease, left hip with bone-on-bone changes, and superior migration of the femoral head.  POSTOPERATIVE DIAGNOSIS:  End-stage degenerative joint disease, left hip with bone-on-bone changes, and superior migration of the femoral head.  PROCEDURES: 1. Left total hip replacement with a Corail stem size 10, 48 mm     Gription cup, 32 mm head with a +9 offset, +4 neutral polyethylene     liner, and a 9 mm delta ceramic hip ball, 32 mm. 2. Interpretation of multiple intraoperative fluoroscopic images.  SURGEON:  Alta Corning, M.D.  Terrence DupontModena Slater.  ANESTHESIA:  __________.  BRIEF HISTORY:  Maria Simmons is a 51 year old female with a long history and significant complaints of left hip pain.  X-ray showed superior migration of her head with widening and bone-on-bone change. She was having night pain and light activity pain, and after failure of all conservative care, she was taken to the operating room for left total hip replacement.  DESCRIPTION OF PROCEDURE:  The patient was taken to the operating room and after adequate anesthesia was obtained with __________ anesthetic, the patient was placed supine on the operating table.  The left hip was prepped and draped in usual sterile fashion.  Following this, the patient was placed onto the [facility] bed.  All bony prominences well padded and she was prepped and draped in the usual sterile fashion.  Following this, an incision was made for an anterior approach to the hip, subcutaneous tissue down the level of the tensor fascia, which was divided in line with its fibers.   Retractors were put in place above and below the femoral neck after finger dissecting the vastus off the fascia.  Once the retractors were put in place, the capsule was opened and tagged.  Provisional neck cut was made and the head was removed to the back table.  Following this, acetabulum was exposed, labrectomy performed.  The acetabulum sequentially reamed to a level of 47 mm, and a 48 Gription cup with no holes was hammered into place, and 30 degrees of anteversion and 45 degrees of lateral opening.  Once this was completed, attention was turned towards the stem side, where stem was put in, sequentially rasped.  The hip was externally rotated and adducted and extended and the femur was sequentially rasped to a level of 10.  Trial reduction was undertaken, looked a bit short.  The final stem was placed and then we trialed with a +9 mm that appears to be the right leg length symmetric by fluoroscopic imaging.  The 9 was opened and placed and the hip was reduced.  Final images were taken.  The capsule was closed with 0 Vicryl running, the tensor fascia closed with 1 Vicryl running, the skin was closed with 0 and 2-0 Vicryl  and 3-0 Monocryl subcutaneous.  Benzoin and Steri-Strips were applied and compressive dressing was applied.  The patient was taken to the recovery, was noted to be in satisfactory condition.  Estimated blood loss for the procedure was 200 mL with the final can be gotten from the anesthetic record.  Exparel was used throughout the case for postoperative pain control.     Alta Corning, M.D.     Corliss Skains  D:  11/12/2017  T:  11/13/2017  Job:  [id number]  cc:   Alta Corning, M.D. Fax: [phone]

## 2017-11-16 ENCOUNTER — Other Ambulatory Visit: Payer: Self-pay | Admitting: *Deleted

## 2017-11-16 DIAGNOSIS — Z7982 Long term (current) use of aspirin: Secondary | ICD-10-CM | POA: Diagnosis not present

## 2017-11-16 DIAGNOSIS — M19011 Primary osteoarthritis, right shoulder: Secondary | ICD-10-CM | POA: Diagnosis not present

## 2017-11-16 DIAGNOSIS — Z471 Aftercare following joint replacement surgery: Secondary | ICD-10-CM | POA: Diagnosis not present

## 2017-11-16 DIAGNOSIS — Z79891 Long term (current) use of opiate analgesic: Secondary | ICD-10-CM | POA: Diagnosis not present

## 2017-11-16 DIAGNOSIS — C8330 Diffuse large B-cell lymphoma, unspecified site: Secondary | ICD-10-CM | POA: Diagnosis not present

## 2017-11-16 DIAGNOSIS — Z85850 Personal history of malignant neoplasm of thyroid: Secondary | ICD-10-CM | POA: Diagnosis not present

## 2017-11-16 DIAGNOSIS — E039 Hypothyroidism, unspecified: Secondary | ICD-10-CM | POA: Diagnosis not present

## 2017-11-16 DIAGNOSIS — Z96642 Presence of left artificial hip joint: Secondary | ICD-10-CM | POA: Diagnosis not present

## 2017-11-16 NOTE — Patient Outreach (Signed)
[facility]) Care Management  11/16/2017  Maria Simmons 1966-04-24 [id number]   Subjective: Telephone call to patient's home  / mobile number, no answer, left HIPAA compliant voicemail message, and requested call back.   Objective: Per KPN (Knowledge Performance Now, point of care tool) and chart review, patient hospitalized at  [facility]  11/12/17 -11/13/17  for LEFT TOTAL HIP ARTHROPLASTY ANTERIOR APPROACH.   Patient has a history of papillary thyroid cancer, DLBCL (diffuse large B cell lymphoma), Hypercholesterolemia, and Hypothyroidism.       Assessment: Received UMR Preoperative  / Transition of care follow up referral on 10/1917.  Preoperative call completed, and transition of care follow up pending patient contact.      Plan: RNCM will call patient for 2nd telephone outreach attempt, transition of care follow up, within 10 business days if no return call.     Ahmaya Ostermiller H. Annia Friendly, BSN, [place] [facility] Telephonic CM Phone: [phone] Fax: [phone]

## 2017-11-17 ENCOUNTER — Other Ambulatory Visit: Payer: Self-pay | Admitting: *Deleted

## 2017-11-17 ENCOUNTER — Ambulatory Visit: Payer: Self-pay | Admitting: *Deleted

## 2017-11-17 ENCOUNTER — Encounter: Payer: Self-pay | Admitting: *Deleted

## 2017-11-17 NOTE — Patient Outreach (Signed)
[facility]) Care Management  11/17/2017  Maria Simmons 1966/11/03 [id number]   Subjective: Telephone call to patient's home / mobile number, spoke with patient, and HIPAA verified.  Discussed [facility] Care Management UMR Transition of care follow up, patient voiced understanding, and is in agreement to follow up.   Patient states she remembers speaking with this RNCM in the past and is recovering well from recent surgery.  States she is staying in her families handicapped accessible home in [place], able to self manage care, daughter is assisting with activities of daily / home management as needed, has a follow up appointment with oncologist on 11/19/17, and surgeon on 11/25/17.  Home Health services have started and going well.  Patient voices understanding of medical diagnosis, surgery, and treatment plan. [facility] benefits discussed on 11/02/17 preoperative call and patient states no additional questions at this time.  Patient states she does not have any education material, transition of care, care coordination, disease management, disease monitoring, transportation, community resource, or pharmacy needs at this time.  States she is very appreciative of the follow up and is in agreement to receive [place] Management information.    Objective:Per KPN (Knowledge Performance Now, point of care tool) and chart review,patient hospitalized at  [facility]  11/12/17 -11/13/17  forLEFT TOTAL HIP ARTHROPLASTY ANTERIOR APPROACH. Patient has a history of papillary thyroid cancer, DLBCL (diffuse large B cell lymphoma),Hypercholesterolemia, andHypothyroidism.     Assessment: Received UMR Preoperative / Transition of care follow up referral on 10/1917. Preoperative call completed, and Transition of care follow up completed, no care management needs, and will proceed with case closure.       Plan:RNCM will send patient successful outreach letter, [facility] pamphlet, and  magnet. RNCM will send case closure due to follow up completed / no care management needs request to [facility].    Franchesca Veneziano H. Annia Friendly, BSN, [place] [facility] Telephonic CM Phone: [phone] Fax: [phone]

## 2017-11-19 ENCOUNTER — Ambulatory Visit (HOSPITAL_BASED_OUTPATIENT_CLINIC_OR_DEPARTMENT_OTHER): Payer: 59 | Admitting: Hematology and Oncology

## 2017-11-19 ENCOUNTER — Other Ambulatory Visit (HOSPITAL_BASED_OUTPATIENT_CLINIC_OR_DEPARTMENT_OTHER): Payer: 59

## 2017-11-19 ENCOUNTER — Encounter: Payer: Self-pay | Admitting: Hematology and Oncology

## 2017-11-19 DIAGNOSIS — D638 Anemia in other chronic diseases classified elsewhere: Secondary | ICD-10-CM

## 2017-11-19 DIAGNOSIS — D72819 Decreased white blood cell count, unspecified: Secondary | ICD-10-CM

## 2017-11-19 DIAGNOSIS — D649 Anemia, unspecified: Secondary | ICD-10-CM

## 2017-11-19 DIAGNOSIS — Z8579 Personal history of other malignant neoplasms of lymphoid, hematopoietic and related tissues: Secondary | ICD-10-CM

## 2017-11-19 DIAGNOSIS — Z8572 Personal history of non-Hodgkin lymphomas: Secondary | ICD-10-CM

## 2017-11-19 LAB — CBC WITH DIFFERENTIAL/PLATELET
BASO%: 0.2 % (ref 0.0–2.0)
Basophils Absolute: 0 10*3/uL (ref 0.0–0.1)
EOS ABS: 0.1 10*3/uL (ref 0.0–0.5)
EOS%: 3.3 % (ref 0.0–7.0)
HCT: 32.7 % — ABNORMAL LOW (ref 34.8–46.6)
HGB: 10.7 g/dL — ABNORMAL LOW (ref 11.6–15.9)
LYMPH%: 21.4 % (ref 14.0–49.7)
MCH: 28.6 pg (ref 25.1–34.0)
MCHC: 32.7 g/dL (ref 31.5–36.0)
MCV: 87.4 fL (ref 79.5–101.0)
MONO#: 0.5 10*3/uL (ref 0.1–0.9)
MONO%: 11.6 % (ref 0.0–14.0)
NEUT%: 63.5 % (ref 38.4–76.8)
NEUTROS ABS: 2.7 10*3/uL (ref 1.5–6.5)
Platelets: 295 10*3/uL (ref 145–400)
RBC: 3.74 10*6/uL (ref 3.70–5.45)
RDW: 13.2 % (ref 11.2–14.5)
WBC: 4.2 10*3/uL (ref 3.9–10.3)
lymph#: 0.9 10*3/uL (ref 0.9–3.3)

## 2017-11-19 LAB — IRON AND TIBC
%SAT: 13 % — ABNORMAL LOW (ref 21–57)
Iron: 44 ug/dL (ref 41–142)
TIBC: 346 ug/dL (ref 236–444)
UIBC: 303 ug/dL (ref 120–384)

## 2017-11-19 LAB — FERRITIN: Ferritin: 93 ng/ml (ref 9–269)

## 2017-11-19 NOTE — Assessment & Plan Note (Signed)
She had recent anemia, likely related to recent surgery She is not symptomatic She does not need blood transfusion Observe only

## 2017-11-19 NOTE — Progress Notes (Signed)
[place] OFFICE PROGRESS NOTE  Patient Care Team: Servando Salina, MD as PCP - General (Obstetrics and Gynecology) Servando Salina, MD as PCP - OBGYN (Obstetrics and Gynecology) Heath Lark, MD as Consulting Physician (Hematology and Oncology) Leota Sauers, RN as Oncology Nurse Navigator Eppie Gibson, MD as Attending Physician (Radiation Oncology) Frederik Pear, MD as Consulting Physician (Orthopedic Surgery) Fanny Skates, MD as Consulting Physician (General Surgery)  SUMMARY OF ONCOLOGIC HISTORY: Oncology History   DLBCL (diffuse large B cell lymphoma)   Staging form: Lymphoid Neoplasms, AJCC 6th Edition     Clinical stage from 11/15/2014: Stage II - Signed by Heath Lark, MD on 11/15/2014       History of lymphoma   09/14/2014 Imaging    PET/CT scan showed Hypermetabolic bilateral cervical lymphadenopathy, left side greater than right, and sub-cm hypermetabolic left subpectoral lymph node. Hypermetabolic activity throughout the lingular and palatine tonsils in Waldeyer's ring      10/12/2014 Procedure    Biopsy of the left neck lymph node came back positive for diffuse large B-cell lymphoma.Accession: [id number]      10/24/2014 Procedure    PAC placed.      10/24/2014 Bone Marrow Biopsy    Bone marrow biopsy was negative.Cytogenetics were normal      10/24/2014 Imaging    Echocardiogram showed normal ejection fraction      10/25/2014 - 12/06/2014 Chemotherapy    She received 3 cyles of R CHOP chemotherapy. Further doses of R CHOP chemotherapy was reduced due to recent neutropenic fever with cycle 1 of treatment.      11/01/2014 Adverse Reaction    She was seen in the emergency Department for possible neutropenic fever. Cultures were negative. She received antibiotic therapy.      11/15/2014 - 12/06/2014 Chemotherapy    Intrathecal chemotherapy is added due to risk of CNS relapse      01/04/2015 Imaging    PET CT scan show complete  remission from lymphadenopathy. Incidental finding of right shoulder arthropathy      01/16/2015 Imaging    MRI of the right shoulder show no evidence of cancer, with evidence of tendon tear and possible changes related to trauma      02/05/2015 - 02/27/2015 Radiation Therapy    IMRT to tonsils, neck and L subpectoral nodal bed:  30.6 Gy in 17 fractions.      04/25/2015 Imaging    CT neck, chest, abdomen/pelvis:  No findings for active lymphomatous involvement.      07/16/2015 Imaging    PET/ CT scans show no evidence of lymphoma. It showed persistent abnormality in the right humeral area      10/18/2015 Procedure    PAC removed.      03/26/2016 Imaging    CT scan of chest, abdomen and pelvis showed no evidence of lymphoma. Noted changes suspicious of infection      03/26/2016 Imaging    CT neck showed asymmetry of the lower palatine tonsils with slight fullness on the left and underfilling of air column at this level.      06/19/2016 Procedure    EGD and colonoscopy showed nonerosive esophagitis      01/11/2017 Imaging    No evidence of recurrent lymphoma or other acute findings within the thorax.      10/06/2017 PET scan    1. No hypermetabolic lymphadenopathy or other definite findings of recurrent lymphoma. 2. New mild patchy hypermetabolism in the left hip joint with associated asymmetric osteoarthritis on  the CT images, most compatible with degenerative uptake. No discrete joint erosions. 3. New symmetric uniform hypermetabolism in the bilateral parotid glands, without discrete parotid mass on the CT images, favoring inflammatory/physiologic uptake. 4. Symmetric mild hypermetabolism in the palatine tonsils and base of tongue bilaterally, without discrete mass correlate on the CT images, favoring inflammatory/physiologic uptake.       INTERVAL HISTORY: Please see below for problem oriented charting. She is seeing after her recent hip surgery Wound is healing well She is  undergoing physical therapy She denies recent infection She complained of recent frequent sweating No recent fever or chills The patient denies any recent signs or symptoms of bleeding such as spontaneous epistaxis, hematuria or hematochezia.  REVIEW OF SYSTEMS:   Constitutional: Denies fevers, chills or abnormal weight loss Eyes: Denies blurriness of vision Ears, nose, mouth, throat, and face: Denies mucositis or sore throat Respiratory: Denies cough, dyspnea or wheezes Cardiovascular: Denies palpitation, chest discomfort or lower extremity swelling Gastrointestinal:  Denies nausea, heartburn or change in bowel habits Skin: Denies abnormal skin rashes Lymphatics: Denies new lymphadenopathy or easy bruising Neurological:Denies numbness, tingling or new weaknesses Behavioral/Psych: Mood is stable, no new changes  All other systems were reviewed with the patient and are negative.  I have reviewed the past medical history, past surgical history, social history and family history with the patient and they are unchanged from previous note.  ALLERGIES:  is allergic to metformin and related; tape; levaquin [levofloxacin]; and penicillins.  MEDICATIONS:  Current Outpatient Medications  Medication Sig Dispense Refill  . aspirin EC 325 MG tablet Take 1 tablet (325 mg total) by mouth 2 (two) times daily after a meal. Take x 1 month post op to decrease risk of blood clots. 60 tablet 0  . Biotin 5000 MCG TABS Take 1 tablet by mouth every morning. Reported on 02/18/2016    . cyanocobalamin 500 MCG tablet Take 500 mcg by mouth daily.    [name] docusate sodium (COLACE) 100 MG capsule Take 1 capsule (100 mg total) by mouth 2 (two) times daily. 30 capsule 0  . levothyroxine (SYNTHROID, LEVOTHROID) 125 MCG tablet Take 125 mcg by mouth daily before breakfast.    . Multiple Vitamin (MULITIVITAMIN WITH MINERALS) TABS Take 1 tablet by mouth every morning.     [name] oxyCODONE-acetaminophen (PERCOCET/ROXICET) 5-325 MG  tablet Take 1-2 tablets by mouth every 6 (six) hours as needed for severe pain. 40 tablet 0  . tiZANidine (ZANAFLEX) 2 MG tablet Take 1 tablet (2 mg total) by mouth every 8 (eight) hours as needed for muscle spasms. 40 tablet 0  . Vitamin D, Cholecalciferol, 1000 UNITS TABS Take 1,000 Units by mouth daily at 12 noon.      No current facility-administered medications for this visit.     PHYSICAL EXAMINATION: ECOG PERFORMANCE STATUS: 1 - Symptomatic but completely ambulatory  Vitals:   11/19/17 1250  BP: 113/67  Pulse: 81  Resp: 20  Temp: 98.5 F (36.9 C)  SpO2: 100%   Filed Weights   11/19/17 1250  Weight: 168 lb 9.6 oz (76.5 kg)    GENERAL:alert, no distress and comfortable SKIN: skin color, texture, turgor are normal, no rashes or significant lesions. Well healed wound EYES: normal, Conjunctiva are pink and non-injected, sclera clear Musculoskeletal:no cyanosis of digits and no clubbing  NEURO: alert & oriented x 3 with fluent speech, no focal motor/sensory deficits  LABORATORY DATA:  I have reviewed the data as listed    Component Value Date/Time  NA 136 11/13/2017 0529   NA 142 09/24/2017 1438   K 4.3 11/13/2017 0529   K 3.6 09/24/2017 1438   CL 103 11/13/2017 0529   CL 102 08/31/2012 1336   CO2 28 11/13/2017 0529   CO2 28 09/24/2017 1438   GLUCOSE 176 (H) 11/13/2017 0529   GLUCOSE 92 09/24/2017 1438   GLUCOSE 95 08/31/2012 1336   BUN 12 11/13/2017 0529   BUN 12.6 09/24/2017 1438   CREATININE 0.86 11/13/2017 0529   CREATININE 0.9 09/24/2017 1438   CALCIUM 8.4 (L) 11/13/2017 0529   CALCIUM 9.0 09/24/2017 1438   PROT 7.1 11/02/2017 1135   PROT 6.7 09/24/2017 1438   ALBUMIN 3.8 11/02/2017 1135   ALBUMIN 3.5 09/24/2017 1438   AST 19 11/02/2017 1135   AST 15 09/24/2017 1438   ALT 11 (L) 11/02/2017 1135   ALT 8 09/24/2017 1438   ALKPHOS 68 11/02/2017 1135   ALKPHOS 73 09/24/2017 1438   BILITOT 0.6 11/02/2017 1135   BILITOT 0.35 09/24/2017 1438   GFRNONAA  >60 11/13/2017 0529   GFRAA >60 11/13/2017 0529    No results found for: SPEP, UPEP  Lab Results  Component Value Date   WBC 4.2 11/19/2017   NEUTROABS 2.7 11/19/2017   HGB 10.7 (L) 11/19/2017   HCT 32.7 (L) 11/19/2017   MCV 87.4 11/19/2017   PLT 295 11/19/2017      Chemistry      Component Value Date/Time   NA 136 11/13/2017 0529   NA 142 09/24/2017 1438   K 4.3 11/13/2017 0529   K 3.6 09/24/2017 1438   CL 103 11/13/2017 0529   CL 102 08/31/2012 1336   CO2 28 11/13/2017 0529   CO2 28 09/24/2017 1438   BUN 12 11/13/2017 0529   BUN 12.6 09/24/2017 1438   CREATININE 0.86 11/13/2017 0529   CREATININE 0.9 09/24/2017 1438      Component Value Date/Time   CALCIUM 8.4 (L) 11/13/2017 0529   CALCIUM 9.0 09/24/2017 1438   ALKPHOS 68 11/02/2017 1135   ALKPHOS 73 09/24/2017 1438   AST 19 11/02/2017 1135   AST 15 09/24/2017 1438   ALT 11 (L) 11/02/2017 1135   ALT 8 09/24/2017 1438   BILITOT 0.6 11/02/2017 1135   BILITOT 0.35 09/24/2017 1438       RADIOGRAPHIC STUDIES: I have personally reviewed the radiological images as listed and agreed with the findings in the report. Dg C-arm 1-60 Min-no Report  Result Date: 11/12/2017 Fluoroscopy was utilized by the requesting physician.  No radiographic interpretation.   Dg Hip Operative Unilat W Or W/o Pelvis Left  Result Date: 11/12/2017 CLINICAL DATA:  Left hip replacement. EXAM: OPERATIVE LEFT HIP (WITH PELVIS IF PERFORMED) 3 VIEWS TECHNIQUE: Fluoroscopic spot image(s) were submitted for interpretation post-operatively. COMPARISON:  No recent. FINDINGS: Total left hip replacement. Hardware intact. Anatomic alignment. No acute bony abnormality. IMPRESSION: Total left hip replacement.  Anatomic alignment . Electronically Signed   By: Marcello Moores  Register   On: 11/12/2017 10:05    ASSESSMENT & PLAN:  History of lymphoma I plan to see her again in 6 months as previously scheduled  Chronic leukopenia Interestingly, leukopenia has  resolved Continue close observation only  Anemia in chronic illness She had recent anemia, likely related to recent surgery She is not symptomatic She does not need blood transfusion Observe only   No orders of the defined types were placed in this encounter.  All questions were answered. The patient knows to call the  clinic with any problems, questions or concerns. No barriers to learning was detected. I spent 10 minutes counseling the patient face to face. The total time spent in the appointment was 15 minutes and more than 50% was on counseling and review of test results     Heath Lark, MD 11/19/2017 1:29 PM

## 2017-11-19 NOTE — Assessment & Plan Note (Signed)
I plan to see her again in 6 months as previously scheduled

## 2017-11-19 NOTE — Assessment & Plan Note (Signed)
Interestingly, leukopenia has resolved Continue close observation only

## 2017-11-24 DIAGNOSIS — Z96642 Presence of left artificial hip joint: Secondary | ICD-10-CM | POA: Diagnosis not present

## 2017-11-24 DIAGNOSIS — Z79891 Long term (current) use of opiate analgesic: Secondary | ICD-10-CM | POA: Diagnosis not present

## 2017-11-24 DIAGNOSIS — E039 Hypothyroidism, unspecified: Secondary | ICD-10-CM | POA: Diagnosis not present

## 2017-11-24 DIAGNOSIS — C8330 Diffuse large B-cell lymphoma, unspecified site: Secondary | ICD-10-CM | POA: Diagnosis not present

## 2017-11-24 DIAGNOSIS — Z7982 Long term (current) use of aspirin: Secondary | ICD-10-CM | POA: Diagnosis not present

## 2017-11-24 DIAGNOSIS — Z471 Aftercare following joint replacement surgery: Secondary | ICD-10-CM | POA: Diagnosis not present

## 2017-11-24 DIAGNOSIS — Z85850 Personal history of malignant neoplasm of thyroid: Secondary | ICD-10-CM | POA: Diagnosis not present

## 2017-11-24 DIAGNOSIS — M19011 Primary osteoarthritis, right shoulder: Secondary | ICD-10-CM | POA: Diagnosis not present

## 2017-11-25 DIAGNOSIS — R350 Frequency of micturition: Secondary | ICD-10-CM | POA: Diagnosis not present

## 2017-11-25 DIAGNOSIS — N8111 Cystocele, midline: Secondary | ICD-10-CM | POA: Diagnosis not present

## 2017-11-25 DIAGNOSIS — M1612 Unilateral primary osteoarthritis, left hip: Secondary | ICD-10-CM | POA: Diagnosis not present

## 2017-11-25 DIAGNOSIS — N3946 Mixed incontinence: Secondary | ICD-10-CM | POA: Diagnosis not present

## 2017-11-25 DIAGNOSIS — R351 Nocturia: Secondary | ICD-10-CM | POA: Diagnosis not present

## 2017-11-25 MED FILL — METHOCARBAMOL 500 MG TABS: 500 | 10 days supply | Qty: 30 | Fill #0

## 2017-11-25 MED FILL — OXYCOD/ACETAMINOPHEN 5-325M: 5-325 | 7 days supply | Qty: 30 | Fill #0

## 2017-12-01 DIAGNOSIS — Z96642 Presence of left artificial hip joint: Secondary | ICD-10-CM | POA: Diagnosis not present

## 2017-12-01 DIAGNOSIS — M25652 Stiffness of left hip, not elsewhere classified: Secondary | ICD-10-CM | POA: Diagnosis not present

## 2017-12-01 DIAGNOSIS — M25552 Pain in left hip: Secondary | ICD-10-CM | POA: Diagnosis not present

## 2017-12-10 DIAGNOSIS — M25552 Pain in left hip: Secondary | ICD-10-CM | POA: Diagnosis not present

## 2017-12-10 DIAGNOSIS — Z9889 Other specified postprocedural states: Secondary | ICD-10-CM | POA: Diagnosis not present

## 2017-12-13 DIAGNOSIS — M25652 Stiffness of left hip, not elsewhere classified: Secondary | ICD-10-CM | POA: Diagnosis not present

## 2017-12-13 DIAGNOSIS — M25552 Pain in left hip: Secondary | ICD-10-CM | POA: Diagnosis not present

## 2017-12-13 DIAGNOSIS — Z96642 Presence of left artificial hip joint: Secondary | ICD-10-CM | POA: Diagnosis not present

## 2017-12-15 DIAGNOSIS — R350 Frequency of micturition: Secondary | ICD-10-CM | POA: Diagnosis not present

## 2017-12-15 DIAGNOSIS — N3941 Urge incontinence: Secondary | ICD-10-CM | POA: Diagnosis not present

## 2017-12-16 DIAGNOSIS — M25652 Stiffness of left hip, not elsewhere classified: Secondary | ICD-10-CM | POA: Diagnosis not present

## 2017-12-16 DIAGNOSIS — Z96642 Presence of left artificial hip joint: Secondary | ICD-10-CM | POA: Diagnosis not present

## 2017-12-16 DIAGNOSIS — M25552 Pain in left hip: Secondary | ICD-10-CM | POA: Diagnosis not present

## 2017-12-20 DIAGNOSIS — Z96642 Presence of left artificial hip joint: Secondary | ICD-10-CM | POA: Diagnosis not present

## 2017-12-20 DIAGNOSIS — M25652 Stiffness of left hip, not elsewhere classified: Secondary | ICD-10-CM | POA: Diagnosis not present

## 2017-12-20 DIAGNOSIS — M25552 Pain in left hip: Secondary | ICD-10-CM | POA: Diagnosis not present

## 2017-12-22 ENCOUNTER — Encounter (HOSPITAL_COMMUNITY): Payer: Self-pay

## 2017-12-24 DIAGNOSIS — H524 Presbyopia: Secondary | ICD-10-CM | POA: Diagnosis not present

## 2017-12-29 DIAGNOSIS — N3941 Urge incontinence: Secondary | ICD-10-CM | POA: Diagnosis not present

## 2017-12-29 DIAGNOSIS — N8111 Cystocele, midline: Secondary | ICD-10-CM | POA: Diagnosis not present

## 2018-01-15 DIAGNOSIS — Z96642 Presence of left artificial hip joint: Secondary | ICD-10-CM | POA: Diagnosis not present

## 2018-01-15 DIAGNOSIS — M25551 Pain in right hip: Secondary | ICD-10-CM | POA: Diagnosis not present

## 2018-03-14 DIAGNOSIS — E7800 Pure hypercholesterolemia, unspecified: Secondary | ICD-10-CM | POA: Diagnosis not present

## 2018-03-14 DIAGNOSIS — E1165 Type 2 diabetes mellitus with hyperglycemia: Secondary | ICD-10-CM | POA: Diagnosis not present

## 2018-03-14 DIAGNOSIS — E559 Vitamin D deficiency, unspecified: Secondary | ICD-10-CM | POA: Diagnosis not present

## 2018-03-14 DIAGNOSIS — E890 Postprocedural hypothyroidism: Secondary | ICD-10-CM | POA: Diagnosis not present

## 2018-03-15 DIAGNOSIS — E1165 Type 2 diabetes mellitus with hyperglycemia: Secondary | ICD-10-CM | POA: Diagnosis not present

## 2018-03-15 DIAGNOSIS — M1611 Unilateral primary osteoarthritis, right hip: Secondary | ICD-10-CM | POA: Diagnosis not present

## 2018-03-15 DIAGNOSIS — E7800 Pure hypercholesterolemia, unspecified: Secondary | ICD-10-CM | POA: Diagnosis not present

## 2018-03-15 DIAGNOSIS — I10 Essential (primary) hypertension: Secondary | ICD-10-CM | POA: Diagnosis not present

## 2018-03-15 DIAGNOSIS — C73 Malignant neoplasm of thyroid gland: Secondary | ICD-10-CM | POA: Diagnosis not present

## 2018-03-15 DIAGNOSIS — M25551 Pain in right hip: Secondary | ICD-10-CM | POA: Diagnosis not present

## 2018-03-15 DIAGNOSIS — E559 Vitamin D deficiency, unspecified: Secondary | ICD-10-CM | POA: Diagnosis not present

## 2018-03-15 DIAGNOSIS — E890 Postprocedural hypothyroidism: Secondary | ICD-10-CM | POA: Diagnosis not present

## 2018-03-16 MED FILL — SYNTHROID 137 MCG TABLET: 137 | 90 days supply | Qty: 90 | Fill #0

## 2018-03-17 ENCOUNTER — Other Ambulatory Visit: Payer: Self-pay | Admitting: Urology

## 2018-03-18 ENCOUNTER — Telehealth: Payer: Self-pay | Admitting: Hematology and Oncology

## 2018-03-18 NOTE — Telephone Encounter (Signed)
Patient called to reschedule  °

## 2018-04-15 ENCOUNTER — Other Ambulatory Visit: Payer: 59

## 2018-04-15 ENCOUNTER — Ambulatory Visit: Payer: 59 | Admitting: Hematology and Oncology

## 2018-04-15 ENCOUNTER — Other Ambulatory Visit: Payer: Self-pay | Admitting: Hematology and Oncology

## 2018-04-15 DIAGNOSIS — Z8579 Personal history of other malignant neoplasms of lymphoid, hematopoietic and related tissues: Secondary | ICD-10-CM

## 2018-04-15 DIAGNOSIS — D509 Iron deficiency anemia, unspecified: Secondary | ICD-10-CM

## 2018-04-18 ENCOUNTER — Inpatient Hospital Stay: Payer: 59 | Attending: Hematology and Oncology

## 2018-04-18 ENCOUNTER — Telehealth: Payer: Self-pay | Admitting: *Deleted

## 2018-04-18 ENCOUNTER — Telehealth: Payer: Self-pay | Admitting: Hematology and Oncology

## 2018-04-18 ENCOUNTER — Inpatient Hospital Stay (HOSPITAL_BASED_OUTPATIENT_CLINIC_OR_DEPARTMENT_OTHER): Payer: 59 | Admitting: Hematology and Oncology

## 2018-04-18 ENCOUNTER — Encounter: Payer: Self-pay | Admitting: Hematology and Oncology

## 2018-04-18 VITALS — BP 122/84 | HR 70 | Temp 98.3°F | Resp 18 | Ht 64.0 in | Wt 168.7 lb

## 2018-04-18 DIAGNOSIS — Z7982 Long term (current) use of aspirin: Secondary | ICD-10-CM

## 2018-04-18 DIAGNOSIS — R490 Dysphonia: Secondary | ICD-10-CM | POA: Diagnosis not present

## 2018-04-18 DIAGNOSIS — D509 Iron deficiency anemia, unspecified: Secondary | ICD-10-CM

## 2018-04-18 DIAGNOSIS — Z85850 Personal history of malignant neoplasm of thyroid: Secondary | ICD-10-CM | POA: Insufficient documentation

## 2018-04-18 DIAGNOSIS — Z79899 Other long term (current) drug therapy: Secondary | ICD-10-CM | POA: Diagnosis not present

## 2018-04-18 DIAGNOSIS — Z8572 Personal history of non-Hodgkin lymphomas: Secondary | ICD-10-CM | POA: Insufficient documentation

## 2018-04-18 DIAGNOSIS — Z923 Personal history of irradiation: Secondary | ICD-10-CM | POA: Insufficient documentation

## 2018-04-18 DIAGNOSIS — Z8579 Personal history of other malignant neoplasms of lymphoid, hematopoietic and related tissues: Secondary | ICD-10-CM

## 2018-04-18 DIAGNOSIS — Z9221 Personal history of antineoplastic chemotherapy: Secondary | ICD-10-CM | POA: Insufficient documentation

## 2018-04-18 DIAGNOSIS — D72819 Decreased white blood cell count, unspecified: Secondary | ICD-10-CM

## 2018-04-18 LAB — CBC WITH DIFFERENTIAL/PLATELET
BASOS PCT: 1 %
Basophils Absolute: 0 10*3/uL (ref 0.0–0.1)
EOS ABS: 0.1 10*3/uL (ref 0.0–0.5)
EOS PCT: 2 %
HCT: 36.3 % (ref 34.8–46.6)
HEMOGLOBIN: 11.9 g/dL (ref 11.6–15.9)
Lymphocytes Relative: 32 %
Lymphs Abs: 1 10*3/uL (ref 0.9–3.3)
MCH: 28.3 pg (ref 25.1–34.0)
MCHC: 32.8 g/dL (ref 31.5–36.0)
MCV: 86.3 fL (ref 79.5–101.0)
Monocytes Absolute: 0.3 10*3/uL (ref 0.1–0.9)
Monocytes Relative: 11 %
NEUTROS PCT: 54 %
Neutro Abs: 1.8 10*3/uL (ref 1.5–6.5)
PLATELETS: 278 10*3/uL (ref 145–400)
RBC: 4.21 MIL/uL (ref 3.70–5.45)
RDW: 14.3 % (ref 11.2–14.5)
WBC: 3.2 10*3/uL — AB (ref 3.9–10.3)

## 2018-04-18 LAB — FERRITIN: FERRITIN: 37 ng/mL (ref 9–269)

## 2018-04-18 LAB — IRON AND TIBC
IRON: 55 ug/dL (ref 41–142)
SATURATION RATIOS: 13 % — AB (ref 21–57)
TIBC: 406 ug/dL (ref 236–444)
UIBC: 351 ug/dL

## 2018-04-18 NOTE — Assessment & Plan Note (Signed)
Interestingly, leukopenia has resolved Continue close observation only

## 2018-04-18 NOTE — Telephone Encounter (Signed)
Gave avs and calendar ° °

## 2018-04-18 NOTE — Telephone Encounter (Signed)
-----   Message from Heath Lark, MD sent at 04/18/2018  1:00 PM EDT ----- Regarding: iron studies Iron studies a bit low but overall, not anemic No need to add extra iron supplement ----- Message ----- From: Buel Ream, Lab In [place] Sent: 04/18/2018  11:44 AM To: Heath Lark, MD

## 2018-04-18 NOTE — Telephone Encounter (Signed)
-----   Message from Heath Lark, MD sent at 04/18/2018  1:00 PM EDT ----- Regarding: iron studies Iron studies a bit low but overall, not anemic No need to add extra iron supplement ----- Message ----- From: Buel Ream, Lab In [place] Sent: 04/18/2018  11:44 AM To: Heath Lark, MD

## 2018-04-18 NOTE — Telephone Encounter (Signed)
LM to call Dr Gorsuch's nurse 

## 2018-04-18 NOTE — Progress Notes (Signed)
[facility] OFFICE PROGRESS NOTE  Patient Care Team: Servando Salina, MD as PCP - General (Obstetrics and Gynecology) Servando Salina, MD as PCP - OBGYN (Obstetrics and Gynecology) Heath Lark, MD as Consulting Physician (Hematology and Oncology) Leota Sauers, RN as Oncology Nurse Navigator Eppie Gibson, MD as Attending Physician (Radiation Oncology) Frederik Pear, MD as Consulting Physician (Orthopedic Surgery) Fanny Skates, MD as Consulting Physician (General Surgery)  ASSESSMENT & PLAN:  History of lymphoma I am concerned about her persistent hoarseness of voice for the last 6 months Her primary lymphoma affected the head and neck region Her last PET CT scan from October 2018 showed abnormal changes in the tonsil region of unknown etiology I recommend repeat imaging study again for further evaluation and potential ENT consultation and she agreed to proceed  Hx of papillary thyroid carcinoma She has history of papillary thyroid carcinoma Examination is benign but she has evidence of hoarseness I will order imaging study as above She will continue thyroid replacement therapy as directed by her endocrinologist  Chronic leukopenia Interestingly, leukopenia has resolved Continue close observation only   Orders Placed This Encounter  Procedures  . NM PET Image Restag (PS) Skull Base To Thigh    Standing Status:   Future    Standing Expiration Date:   04/19/2019    Order Specific Question:   If indicated for the ordered procedure, I authorize the administration of a radiopharmaceutical per Radiology protocol    Answer:   Yes    Order Specific Question:   Preferred imaging location?    Answer:   [facility]    Order Specific Question:   Radiology Contrast Protocol - do NOT remove file path    Answer:   [email or web address]    Order Specific Question:   Is the patient pregnant?    Answer:   No    INTERVAL  HISTORY: Please see below for problem oriented charting. She returns for further follow-up She denies recent infection She complained of persistent hoarseness for 6 months She denies new lymphadenopathy  SUMMARY OF ONCOLOGIC HISTORY: Oncology History   DLBCL (diffuse large B cell lymphoma)   Staging form: Lymphoid Neoplasms, AJCC 6th Edition     Clinical stage from 11/15/2014: Stage II - Signed by Heath Lark, MD on 11/15/2014       History of lymphoma   09/14/2014 Imaging    PET/CT scan showed Hypermetabolic bilateral cervical lymphadenopathy, left side greater than right, and sub-cm hypermetabolic left subpectoral lymph node. Hypermetabolic activity throughout the lingular and palatine tonsils in Waldeyer's ring      10/12/2014 Procedure    Biopsy of the left neck lymph node came back positive for diffuse large B-cell lymphoma.Accession: [id number]      10/24/2014 Procedure    PAC placed.      10/24/2014 Bone Marrow Biopsy    Bone marrow biopsy was negative.Cytogenetics were normal      10/24/2014 Imaging    Echocardiogram showed normal ejection fraction      10/25/2014 - 12/06/2014 Chemotherapy    She received 3 cyles of R CHOP chemotherapy. Further doses of R CHOP chemotherapy was reduced due to recent neutropenic fever with cycle 1 of treatment.      11/01/2014 Adverse Reaction    She was seen in the emergency Department for possible neutropenic fever. Cultures were negative. She received antibiotic therapy.      11/15/2014 - 12/06/2014 Chemotherapy    Intrathecal chemotherapy is added due to risk  of CNS relapse      01/04/2015 Imaging    PET CT scan show complete remission from lymphadenopathy. Incidental finding of right shoulder arthropathy      01/16/2015 Imaging    MRI of the right shoulder show no evidence of cancer, with evidence of tendon tear and possible changes related to trauma      02/05/2015 - 02/27/2015 Radiation Therapy    IMRT to tonsils, neck and  L subpectoral nodal bed:  30.6 Gy in 17 fractions.      04/25/2015 Imaging    CT neck, chest, abdomen/pelvis:  No findings for active lymphomatous involvement.      07/16/2015 Imaging    PET/ CT scans show no evidence of lymphoma. It showed persistent abnormality in the right humeral area      10/18/2015 Procedure    PAC removed.      03/26/2016 Imaging    CT scan of chest, abdomen and pelvis showed no evidence of lymphoma. Noted changes suspicious of infection      03/26/2016 Imaging    CT neck showed asymmetry of the lower palatine tonsils with slight fullness on the left and underfilling of air column at this level.      06/19/2016 Procedure    EGD and colonoscopy showed nonerosive esophagitis      01/11/2017 Imaging    No evidence of recurrent lymphoma or other acute findings within the thorax.      10/06/2017 PET scan    1. No hypermetabolic lymphadenopathy or other definite findings of recurrent lymphoma. 2. New mild patchy hypermetabolism in the left hip joint with associated asymmetric osteoarthritis on the CT images, most compatible with degenerative uptake. No discrete joint erosions. 3. New symmetric uniform hypermetabolism in the bilateral parotid glands, without discrete parotid mass on the CT images, favoring inflammatory/physiologic uptake. 4. Symmetric mild hypermetabolism in the palatine tonsils and base of tongue bilaterally, without discrete mass correlate on the CT images, favoring inflammatory/physiologic uptake.       REVIEW OF SYSTEMS:   Constitutional: Denies fevers, chills or abnormal weight loss Eyes: Denies blurriness of vision Ears, nose, mouth, throat, and face: Denies mucositis or sore throat Respiratory: Denies cough, dyspnea or wheezes Cardiovascular: Denies palpitation, chest discomfort or lower extremity swelling Gastrointestinal:  Denies nausea, heartburn or change in bowel habits Skin: Denies abnormal skin rashes Lymphatics: Denies new  lymphadenopathy or easy bruising Neurological:Denies numbness, tingling or new weaknesses Behavioral/Psych: Mood is stable, no new changes  All other systems were reviewed with the patient and are negative.  I have reviewed the past medical history, past surgical history, social history and family history with the patient and they are unchanged from previous note.  ALLERGIES:  is allergic to metformin and related; tape; levaquin [levofloxacin]; and penicillins.  MEDICATIONS:  Current Outpatient Medications  Medication Sig Dispense Refill  . levothyroxine (SYNTHROID, LEVOTHROID) 175 MCG tablet Take 175 mcg by mouth daily before breakfast.    . aspirin EC 325 MG tablet Take 1 tablet (325 mg total) by mouth 2 (two) times daily after a meal. Take x 1 month post op to decrease risk of blood clots. 60 tablet 0  . Biotin 5000 MCG TABS Take 1 tablet by mouth every morning. Reported on 02/18/2016    . cyanocobalamin 500 MCG tablet Take 500 mcg by mouth daily.    [name] docusate sodium (COLACE) 100 MG capsule Take 1 capsule (100 mg total) by mouth 2 (two) times daily. 30 capsule 0  .  Multiple Vitamin (MULITIVITAMIN WITH MINERALS) TABS Take 1 tablet by mouth every morning.     [name] oxyCODONE-acetaminophen (PERCOCET/ROXICET) 5-325 MG tablet Take 1-2 tablets by mouth every 6 (six) hours as needed for severe pain. 40 tablet 0  . tiZANidine (ZANAFLEX) 2 MG tablet Take 1 tablet (2 mg total) by mouth every 8 (eight) hours as needed for muscle spasms. 40 tablet 0  . Vitamin D, Cholecalciferol, 1000 UNITS TABS Take 1,000 Units by mouth daily at 12 noon.      No current facility-administered medications for this visit.     PHYSICAL EXAMINATION: ECOG PERFORMANCE STATUS: 1 - Symptomatic but completely ambulatory  Vitals:   04/18/18 1153  BP: 122/84  Pulse: 70  Resp: 18  Temp: 98.3 F (36.8 C)  SpO2: 100%   Filed Weights   04/18/18 1153  Weight: 168 lb 11.2 oz (76.5 kg)    GENERAL:alert, no distress and  comfortable SKIN: skin color, texture, turgor are normal, no rashes or significant lesions EYES: normal, Conjunctiva are pink and non-injected, sclera clear OROPHARYNX:no exudate, no erythema and lips, buccal mucosa, and tongue normal  NECK: Noted well-healed surgical scar  lYMPH:  no palpable lymphadenopathy in the cervical, axillary or inguinal LUNGS: clear to auscultation and percussion with normal breathing effort HEART: regular rate & rhythm and no murmurs and no lower extremity edema ABDOMEN:abdomen soft, non-tender and normal bowel sounds Musculoskeletal:no cyanosis of digits and no clubbing  NEURO: alert & oriented x 3 with fluent speech but with mild hoarseness, no focal motor/sensory deficits  LABORATORY DATA:  I have reviewed the data as listed    Component Value Date/Time   NA 136 11/13/2017 0529   NA 142 09/24/2017 1438   K 4.3 11/13/2017 0529   K 3.6 09/24/2017 1438   CL 103 11/13/2017 0529   CL 102 08/31/2012 1336   CO2 28 11/13/2017 0529   CO2 28 09/24/2017 1438   GLUCOSE 176 (H) 11/13/2017 0529   GLUCOSE 92 09/24/2017 1438   GLUCOSE 95 08/31/2012 1336   BUN 12 11/13/2017 0529   BUN 12.6 09/24/2017 1438   CREATININE 0.86 11/13/2017 0529   CREATININE 0.9 09/24/2017 1438   CALCIUM 8.4 (L) 11/13/2017 0529   CALCIUM 9.0 09/24/2017 1438   PROT 7.1 11/02/2017 1135   PROT 6.7 09/24/2017 1438   ALBUMIN 3.8 11/02/2017 1135   ALBUMIN 3.5 09/24/2017 1438   AST 19 11/02/2017 1135   AST 15 09/24/2017 1438   ALT 11 (L) 11/02/2017 1135   ALT 8 09/24/2017 1438   ALKPHOS 68 11/02/2017 1135   ALKPHOS 73 09/24/2017 1438   BILITOT 0.6 11/02/2017 1135   BILITOT 0.35 09/24/2017 1438   GFRNONAA >60 11/13/2017 0529   GFRAA >60 11/13/2017 0529    No results found for: SPEP, UPEP  Lab Results  Component Value Date   WBC 3.2 (L) 04/18/2018   NEUTROABS 1.8 04/18/2018   HGB 11.9 04/18/2018   HCT 36.3 04/18/2018   MCV 86.3 04/18/2018   PLT 278 04/18/2018      Chemistry       Component Value Date/Time   NA 136 11/13/2017 0529   NA 142 09/24/2017 1438   K 4.3 11/13/2017 0529   K 3.6 09/24/2017 1438   CL 103 11/13/2017 0529   CL 102 08/31/2012 1336   CO2 28 11/13/2017 0529   CO2 28 09/24/2017 1438   BUN 12 11/13/2017 0529   BUN 12.6 09/24/2017 1438   CREATININE 0.86 11/13/2017 0529  CREATININE 0.9 09/24/2017 1438      Component Value Date/Time   CALCIUM 8.4 (L) 11/13/2017 0529   CALCIUM 9.0 09/24/2017 1438   ALKPHOS 68 11/02/2017 1135   ALKPHOS 73 09/24/2017 1438   AST 19 11/02/2017 1135   AST 15 09/24/2017 1438   ALT 11 (L) 11/02/2017 1135   ALT 8 09/24/2017 1438   BILITOT 0.6 11/02/2017 1135   BILITOT 0.35 09/24/2017 1438      All questions were answered. The patient knows to call the clinic with any problems, questions or concerns. No barriers to learning was detected.  I spent 15 minutes counseling the patient face to face. The total time spent in the appointment was 20 minutes and more than 50% was on counseling and review of test results  Heath Lark, MD 04/18/2018 1:21 PM

## 2018-04-18 NOTE — Telephone Encounter (Signed)
LM with note below 

## 2018-04-18 NOTE — Assessment & Plan Note (Signed)
I am concerned about her persistent hoarseness of voice for the last 6 months Her primary lymphoma affected the head and neck region Her last PET CT scan from October 2018 showed abnormal changes in the tonsil region of unknown etiology I recommend repeat imaging study again for further evaluation and potential ENT consultation and she agreed to proceed

## 2018-04-18 NOTE — Assessment & Plan Note (Signed)
She has history of papillary thyroid carcinoma Examination is benign but she has evidence of hoarseness I will order imaging study as above She will continue thyroid replacement therapy as directed by her endocrinologist

## 2018-04-21 ENCOUNTER — Ambulatory Visit (HOSPITAL_COMMUNITY)
Admission: RE | Admit: 2018-04-21 | Discharge: 2018-04-21 | Disposition: A | Discharge status: Home / Self Care | Payer: 59 | Source: Ambulatory Visit | Attending: Hematology and Oncology | Admitting: Hematology and Oncology

## 2018-04-21 ENCOUNTER — Telehealth: Payer: Self-pay | Admitting: Hematology and Oncology

## 2018-04-21 DIAGNOSIS — R490 Dysphonia: Secondary | ICD-10-CM | POA: Diagnosis not present

## 2018-04-21 DIAGNOSIS — C8590 Non-Hodgkin lymphoma, unspecified, unspecified site: Secondary | ICD-10-CM | POA: Diagnosis not present

## 2018-04-21 DIAGNOSIS — Z8579 Personal history of other malignant neoplasms of lymphoid, hematopoietic and related tissues: Secondary | ICD-10-CM | POA: Insufficient documentation

## 2018-04-21 DIAGNOSIS — Z85850 Personal history of malignant neoplasm of thyroid: Secondary | ICD-10-CM | POA: Diagnosis not present

## 2018-04-21 LAB — GLUCOSE, CAPILLARY: GLUCOSE-CAPILLARY: 104 mg/dL — AB (ref 65–99)

## 2018-04-21 MED ORDER — TECHNETIUM TC 99M MEBROFENIN IV KIT
8.3700 | PACK | Freq: Once | INTRAVENOUS | Status: AC | PRN
  Administered 2018-04-21: 8.37 via INTRAVENOUS

## 2018-04-21 NOTE — Telephone Encounter (Signed)
I have reviewed the PET CT scan with the patient No signs of cancer recurrence The patient is reassured She is comfortable with no further work-up for hoarseness I will see her back in 6 months as scheduled

## 2018-04-25 NOTE — Patient Instructions (Addendum)
LABRINA LINES  04/25/2018   Your procedure is scheduled on: 05/03/2018  Tuesday  Report to [facility] Main  Entrance              Report to admitting at    0530 AM    Call this number if you have problems the morning of surgery [phone]    Remember: Do not eat food or drink liquids :After Midnight.     Take these medicines the morning of surgery with A SIP OF WATER:  Synthroid                                 You may not have any metal on your body including hair pins and              piercings  Do not wear jewelry, make-up, lotions, powders or perfumes, deodorant             Do not wear nail polish.  Do not shave  48 hours prior to surgery.                Do not bring valuables to the hospital. [place].  Contacts, dentures or bridgework may not be worn into surgery.  Leave suitcase in the car. After surgery it may be brought to your room.                   Please read over the following fact sheets you were given: _____________________________________________________________________             [facility] - Preparing for Surgery Before surgery, you can play an important role.  Because skin is not sterile, your skin needs to be as free of germs as possible.  You can reduce the number of germs on your skin by washing with CHG (chlorahexidine gluconate) soap before surgery.  CHG is an antiseptic cleaner which kills germs and bonds with the skin to continue killing germs even after washing. Please DO NOT use if you have an allergy to CHG or antibacterial soaps.  If your skin becomes reddened/irritated stop using the CHG and inform your nurse when you arrive at Short Stay. Do not shave (including legs and underarms) for at least 48 hours prior to the first CHG shower.  You may shave your face/neck. Please follow these instructions carefully:  1.  Shower with CHG Soap the night before surgery and  the  morning of Surgery.  2.  If you choose to wash your hair, wash your hair first as usual with your  normal  shampoo.  3.  After you shampoo, rinse your hair and body thoroughly to remove the  shampoo.                           4.  Use CHG as you would any other liquid soap.  You can apply chg directly  to the skin and wash                       Gently with a scrungie or clean washcloth.  5.  Apply the CHG Soap to your body ONLY FROM THE NECK DOWN.  Do not use on face/ open                           Wound or open sores. Avoid contact with eyes, ears mouth and genitals (private parts).                       Wash face,  Genitals (private parts) with your normal soap.             6.  Wash thoroughly, paying special attention to the area where your surgery  will be performed.  7.  Thoroughly rinse your body with warm water from the neck down.  8.  DO NOT shower/wash with your normal soap after using and rinsing off  the CHG Soap.                9.  Pat yourself dry with a clean towel.            10.  Wear clean pajamas.            11.  Place clean sheets on your bed the night of your first shower and do not  sleep with pets. Day of Surgery : Do not apply any lotions/deodorants the morning of surgery.  Please wear clean clothes to the hospital/surgery center.  FAILURE TO FOLLOW THESE INSTRUCTIONS MAY RESULT IN THE CANCELLATION OF YOUR SURGERY PATIENT SIGNATURE_________________________________  NURSE SIGNATURE__________________________________  ________________________________________________________________________  WHAT IS A BLOOD TRANSFUSION? Blood Transfusion Information  A transfusion is the replacement of blood or some of its parts. Blood is made up of multiple cells which provide different functions.  Red blood cells carry oxygen and are used for blood loss replacement.  White blood cells fight against infection.  Platelets control bleeding.  Plasma helps clot blood.  Other  blood products are available for specialized needs, such as hemophilia or other clotting disorders. BEFORE THE TRANSFUSION  Who gives blood for transfusions?   Healthy volunteers who are fully evaluated to make sure their blood is safe. This is blood bank blood. Transfusion therapy is the safest it has ever been in the practice of medicine. Before blood is taken from a donor, a complete history is taken to make sure that person has no history of diseases nor engages in risky social behavior (examples are intravenous drug use or sexual activity with multiple partners). The donor's travel history is screened to minimize risk of transmitting infections, such as malaria. The donated blood is tested for signs of infectious diseases, such as HIV and hepatitis. The blood is then tested to be sure it is compatible with you in order to minimize the chance of a transfusion reaction. If you or a relative donates blood, this is often done in anticipation of surgery and is not appropriate for emergency situations. It takes many days to process the donated blood. RISKS AND COMPLICATIONS Although transfusion therapy is very safe and saves many lives, the main dangers of transfusion include:   Getting an infectious disease.  Developing a transfusion reaction. This is an allergic reaction to something in the blood you were given. Every precaution is taken to prevent this. The decision to have a blood transfusion has been considered carefully by your caregiver before blood is given. Blood is not given unless the benefits outweigh the risks. AFTER THE TRANSFUSION  Right after receiving a blood transfusion, you will usually feel much better and more energetic. This is especially  true if your red blood cells have gotten low (anemic). The transfusion raises the level of the red blood cells which carry oxygen, and this usually causes an energy increase.  The nurse administering the transfusion will monitor you carefully  for complications. HOME CARE INSTRUCTIONS  No special instructions are needed after a transfusion. You may find your energy is better. Speak with your caregiver about any limitations on activity for underlying diseases you may have. SEEK MEDICAL CARE IF:   Your condition is not improving after your transfusion.  You develop redness or irritation at the intravenous (IV) site. SEEK IMMEDIATE MEDICAL CARE IF:  Any of the following symptoms occur over the next 12 hours:  Shaking chills.  You have a temperature by mouth above 102 F (38.9 C), not controlled by medicine.  Chest, back, or muscle pain.  People around you feel you are not acting correctly or are confused.  Shortness of breath or difficulty breathing.  Dizziness and fainting.  You get a rash or develop hives.  You have a decrease in urine output.  Your urine turns a dark color or changes to pink, red, or brown. Any of the following symptoms occur over the next 10 days:  You have a temperature by mouth above 102 F (38.9 C), not controlled by medicine.  Shortness of breath.  Weakness after normal activity.  The white part of the eye turns yellow (jaundice).  You have a decrease in the amount of urine or are urinating less often.  Your urine turns a dark color or changes to pink, red, or brown. Document Released: 11/27/2000 Document Revised: 02/22/2012 Document Reviewed: 07/16/2008 [facility] Patient Information 2014 [place], [place].  _______________________________________________________________________

## 2018-04-26 ENCOUNTER — Other Ambulatory Visit: Payer: Self-pay

## 2018-04-26 ENCOUNTER — Encounter (HOSPITAL_COMMUNITY)
Admission: RE | Admit: 2018-04-26 | Discharge: 2018-04-26 | Disposition: A | Discharge status: Home / Self Care | Payer: 59 | Source: Ambulatory Visit | Attending: Urology | Admitting: Urology

## 2018-04-26 ENCOUNTER — Encounter (HOSPITAL_COMMUNITY): Payer: Self-pay

## 2018-04-26 DIAGNOSIS — Z01812 Encounter for preprocedural laboratory examination: Secondary | ICD-10-CM | POA: Diagnosis not present

## 2018-04-26 LAB — CBC
HCT: 37.8 % (ref 36.0–46.0)
Hemoglobin: 12 g/dL (ref 12.0–15.0)
MCH: 28 pg (ref 26.0–34.0)
MCHC: 31.7 g/dL (ref 30.0–36.0)
MCV: 88.1 fL (ref 78.0–100.0)
PLATELETS: 327 10*3/uL (ref 150–400)
RBC: 4.29 MIL/uL (ref 3.87–5.11)
RDW: 13.9 % (ref 11.5–15.5)
WBC: 3 10*3/uL — ABNORMAL LOW (ref 4.0–10.5)

## 2018-04-26 LAB — BASIC METABOLIC PANEL
Anion gap: 9 (ref 5–15)
BUN: 14 mg/dL (ref 6–20)
CHLORIDE: 102 mmol/L (ref 101–111)
CO2: 29 mmol/L (ref 22–32)
CREATININE: 0.96 mg/dL (ref 0.44–1.00)
Calcium: 9 mg/dL (ref 8.9–10.3)
GFR calc non Af Amer: >60 mL/min (ref >60)
GLUCOSE: 109 mg/dL — AB (ref 65–99)
Potassium: 4.3 mmol/L (ref 3.5–5.1)
Sodium: 140 mmol/L (ref 135–145)

## 2018-04-26 LAB — PROTIME-INR
INR: 0.99
Prothrombin Time: 13 seconds (ref 11.4–15.2)

## 2018-04-26 NOTE — Progress Notes (Signed)
05/31/2017- noted in [place]  05/30/2017- noted in -CXR  04/18/2018- noted in Epic- CBC w/diff., Ferritin, Iron and TBC

## 2018-05-01 NOTE — H&P (Signed)
I was consulted by the above provider to assess the patient's prolapse worsening over 1 year. She can feel vaginal bulging and she does reduce it. She does not describe splinting maneuvers and she has had a hysterectomy. She is having issues with constipation   Rarely she thinks she leaks a small amount with a cough and with urgency if her bladder is full. She denies bedwetting and does not wear a pad. She voids every 60-90 minutes and gets up at least 1 time at night. 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 Vaginal cough descended from 8 or 9 cm to approximately 5 or 6 cm. One could argue her vaginal length was minimally decreased at rest. She did not have a rectocele but she had an elevated tissue area near the introitus almost like a tag of tissue but not a true posterior defect. She had mild grade 2 hypermobility the bladder neck and no stress incontinence with a moderate cough   She is Panzy's cousin   The patient describes very mild mixed Incontinence and prolapse with mild nocturia and moderate frequency. a picture was drawn. If the patient had surgery she would likely best benefit from a transvaginal vault suspension with cystocele repair and graft. The role of urodynamics was discussed. I do not think I could accommodate her wishes regarding timing of surgery and certainly 1 could argue she would be at increased risk after hip surgery of having a deep vein thrombosis in the lower extremity with a long pelvic operation utilizing stirrups   Today  frequency incontinence or prolapse are stable  on urodynamics the patient was initially catheterized for 100 mL. Her initial residual was 0 mL. Bladder capacity was 300 mL. She had sensory urgency. Her bladder was  stable. She did note this sometimes she has urge incontinence when she is going to the restroom. She did not leak with Valsalva pressure of 110 cm water and noted to [place] she does not have stress incontinence. During voiding she voided 200 mL with a maximum flow of 6 mL/sec. Maximum voiding pressure of 22 cm of water. Residual was 100 mL. She said this was a normal flow for her. EMG activity increased during the voiding phase. Fluoroscopically she had a moderate cystocele noted. Hip hardware was noted. The details of the urodynamics are Simon dictating   I drew the patient picture. We talked about watchful waiting versus a pessary versus a transvaginal vault suspension with cystocele repair and graft. I do not believe there is enough evidence to recommend a sling. Persistent or worsening incontinence and sequelae was discussed. Mesh issues was discussed.   Patient is not sexually active. Because of time away from work we will do the surgery in April which I think is [place] plenty of time regarding her hip. I truly believe it should be done on a Tuesday to allow her to rest enough because she wants go back to work the following Monday.     ALLERGIES: Adhesive tape levofloxacin MetFORMIN HCl TABS Penicillins - Skin Rash    MEDICATIONS: Aspirin Ec 81 mg tablet, delayed release Oral  Biotin 5 mg tablet Oral  Colace  Multivitamins tablet Oral  Oxycodone-Acetaminophen  Synthroid 112 mcg tablet Oral  Tizanidine Hcl  Vitamin B-12 ER TBCR Oral  Vitamin D3 2,000 unit tablet Oral     GU PSH: Complex cystometrogram, w/ void pressure and urethral pressure profile studies, any technique - 12/15/2017 Complex Uroflow - 12/15/2017 Emg surf Electrd - 12/15/2017 Hysterectomy Unilat SO - 03/02/2016 Inject For cystogram - 12/15/2017 Intrabd voidng Press - 12/15/2017      PSH Notes: Laparosc Gastric Restrictive Proc By Adjustable Gastric Band, Thyroid Surgery Total Thyroidectomy, Hysterectomy   NON-GU PSH: Remove  Thyroid - 03/02/2016    GU PMH: Cystocele, midline (Stable) - 11/25/2017, Cystocele, midline, - 04/30/2016 Mixed incontinence - 11/25/2017 Nocturia - 11/25/2017 Urinary Frequency - 11/25/2017 Urge incontinence, Urge incontinence of urine - 04/30/2016    NON-GU PMH: Muscle weakness (generalized), Muscle weakness - 04/30/2016 Other lack of coordination, Muscular incoordination - 04/30/2016 Personal history of malignant neoplasm of thyroid, History of malignant neoplasm of thyroid - 03/02/2016 Pulmonary Embolism, History, History of pulmonary embolism - 03/02/2016 Unspecified B-cell lymphoma, unspecified site, B-cell lymphoma - 03/02/2016 Encounter for general adult medical examination without abnormal findings, Encounter for preventive health examination    FAMILY HISTORY: Acute Respiratory Failure - Runs In Family Colon Cancer - Runs In Family Congestive Heart Failure - Runs In Family Death of family member - Runs In Family Diabetes - Runs In Family Prostate Cancer - Runs In Family renal failure - Runs In Family   SOCIAL HISTORY: None    Notes: Occupation, Alcohol use, Single, Number of children, Never a smoker, Caffeine use   REVIEW OF SYSTEMS:    GU Review Female:   Patient denies frequent urination, hard to postpone urination, burning /pain with urination, get up at night to urinate, leakage of urine, stream starts and stops, trouble starting your stream, have to strain to urinate, and being pregnant.  Gastrointestinal (Upper):   Patient denies nausea, vomiting, and indigestion/ heartburn.  Gastrointestinal (Lower):   Patient denies diarrhea and constipation.  Constitutional:   Patient denies fever, night sweats, weight loss, and fatigue.  Skin:   Patient denies skin rash/ lesion and itching.  Eyes:   Patient denies blurred vision and double vision.  Ears/ Nose/ Throat:   Patient denies sore throat and sinus problems.  Hematologic/Lymphatic:   Patient denies swollen glands and easy  bruising.  Cardiovascular:   Patient denies leg swelling and chest pains.  Respiratory:   Patient denies cough and shortness of breath.  Endocrine:   Patient denies excessive thirst.  Musculoskeletal:   Patient denies back pain and joint pain.  Neurological:   Patient denies headaches and dizziness.  Psychologic:   Patient denies depression and anxiety.   VITAL SIGNS: None   PAST DATA REVIEWED:  Source Of History:  Patient   PROCEDURES: None   ASSESSMENT:      ICD-10 Details  1 GU:   Cystocele, midline - N81.11   2   Urge incontinence - N39.41               Notes:   I drew her a picture and we talked about prolapse surgery in detail. Pros, cons, general surgical and anesthetic risks, and other options including behavioral therapy, pessaries, and watchful waiting were discussed. She understands that prolapse repairs are successful in 80-85% of cases for prolapse symptoms and can recur anteriorly, posteriorly, and/or apically. She understands that in most cases I use a graft and general risks were discussed. Surgical risks were described but not limited to the discussion of injury to neighboring structures including the  bowel (with possible life-threatening sepsis and colostomy), bladder, urethra, vagina (all resulting in further surgery), and ureter (resulting in re-implantation). We talked about injury to nerves/soft tissue leading to debilitating and intractable pelvic, abdominal, and lower extremity pain syndromes and neuropathies. The risks of buttock pain, intractable dyspareunia, and vaginal narrowing and shortening with sequelae were discussed. Bleeding risks, transfusion rates, and infection were discussed. The risk of persistent, de novo, or worsening bladder and/or bowel incontinence/dysfunction was discussed. The need for CIC was described as well the usual post-operative course. The patient understands that she might not reach her treatment goal and that she might be worse following  surgery.    PLAN:           Schedule Return Visit/Planned Activity: Return PRN - Office Visit             Note: We will call her   After a thorough review of the management options for the patient's condition the patient  elected to proceed with surgical therapy as noted above. We have discussed the potential benefits and risks of the procedure, side effects of the proposed treatment, the likelihood of the patient achieving the goals of the procedure, and any potential problems that might occur during the procedure or recuperation. Informed consent has been obtained.

## 2018-05-02 MED ORDER — GENTAMICIN SULFATE 40 MG/ML IJ SOLN
5.0000 mg/kg | INTRAVENOUS | Status: AC
  Administered 2018-05-03: 320 mg via INTRAVENOUS
  Filled 2018-05-02: qty 8

## 2018-05-02 NOTE — Anesthesia Preprocedure Evaluation (Addendum)
Anesthesia Evaluation  Patient identified by MRN, date of birth, ID band Patient awake    Reviewed: Allergy & Precautions, NPO status , Patient's Chart, lab work & pertinent test results  History of Anesthesia Complications (+) PONV  Airway Mallampati: II  TM Distance: >3 FB Neck ROM: Full    Dental no notable dental hx.    Pulmonary neg pulmonary ROS,    Pulmonary exam normal breath sounds clear to auscultation       Cardiovascular negative cardio ROS Normal cardiovascular exam Rhythm:Regular Rate:Normal     Neuro/Psych negative neurological ROS  negative psych ROS   GI/Hepatic negative GI ROS, Neg liver ROS,   Endo/Other  Hypothyroidism   Renal/GU negative Renal ROS  negative genitourinary   Musculoskeletal   Abdominal   Peds negative pediatric ROS (+)  Hematology  (+) anemia ,   Anesthesia Other Findings All : Metformin, Levaquin, PCN  Reproductive/Obstetrics                             Lab Results  Component Value Date   CREATININE 0.96 04/26/2018   BUN 14 04/26/2018   NA 140 04/26/2018   K 4.3 04/26/2018   CL 102 04/26/2018   CO2 29 04/26/2018    Lab Results  Component Value Date   WBC 3.0 (L) 04/26/2018   HGB 12.0 04/26/2018   HCT 37.8 04/26/2018   MCV 88.1 04/26/2018   PLT 327 04/26/2018    Anesthesia Physical Anesthesia Plan  ASA: II  Anesthesia Plan: General   Post-op Pain Management:    Induction: Intravenous  PONV Risk Score and Plan: Treatment may vary due to age or medical condition, Dexamethasone, Ondansetron and Scopolamine patch - Pre-op  Airway Management Planned: Oral ETT  Additional Equipment:   Intra-op Plan:   Post-operative Plan: Extubation in OR  Informed Consent: I have reviewed the patients History and Physical, chart, labs and discussed the procedure including the risks, benefits and alternatives for the proposed anesthesia with  the patient or authorized representative who has indicated his/her understanding and acceptance.   Dental advisory given  Plan Discussed with: CRNA  Anesthesia Plan Comments:         Anesthesia Quick Evaluation

## 2018-05-03 ENCOUNTER — Ambulatory Visit (HOSPITAL_COMMUNITY): Payer: 59 | Admitting: Anesthesiology

## 2018-05-03 ENCOUNTER — Encounter (HOSPITAL_COMMUNITY)
Admission: RE | Disposition: A | Discharge status: Home / Self Care | Payer: Self-pay | Source: Ambulatory Visit | Attending: Urology

## 2018-05-03 ENCOUNTER — Encounter (HOSPITAL_COMMUNITY): Payer: Self-pay | Admitting: *Deleted

## 2018-05-03 ENCOUNTER — Observation Stay (HOSPITAL_COMMUNITY)
Admission: RE | Admit: 2018-05-03 | Discharge: 2018-05-04 | Disposition: A | Discharge status: Home / Self Care | Payer: 59 | Source: Ambulatory Visit | Attending: Urology | Admitting: Urology

## 2018-05-03 DIAGNOSIS — N3941 Urge incontinence: Secondary | ICD-10-CM | POA: Insufficient documentation

## 2018-05-03 DIAGNOSIS — N8111 Cystocele, midline: Secondary | ICD-10-CM | POA: Diagnosis not present

## 2018-05-03 DIAGNOSIS — Z880 Allergy status to penicillin: Secondary | ICD-10-CM | POA: Insufficient documentation

## 2018-05-03 DIAGNOSIS — Z85850 Personal history of malignant neoplasm of thyroid: Secondary | ICD-10-CM | POA: Insufficient documentation

## 2018-05-03 DIAGNOSIS — Z86718 Personal history of other venous thrombosis and embolism: Secondary | ICD-10-CM | POA: Diagnosis not present

## 2018-05-03 DIAGNOSIS — D72819 Decreased white blood cell count, unspecified: Secondary | ICD-10-CM | POA: Diagnosis not present

## 2018-05-03 DIAGNOSIS — N8110 Cystocele, unspecified: Secondary | ICD-10-CM | POA: Diagnosis not present

## 2018-05-03 DIAGNOSIS — Z79899 Other long term (current) drug therapy: Secondary | ICD-10-CM | POA: Insufficient documentation

## 2018-05-03 DIAGNOSIS — E039 Hypothyroidism, unspecified: Secondary | ICD-10-CM | POA: Diagnosis not present

## 2018-05-03 DIAGNOSIS — D509 Iron deficiency anemia, unspecified: Secondary | ICD-10-CM | POA: Diagnosis not present

## 2018-05-03 DIAGNOSIS — D649 Anemia, unspecified: Secondary | ICD-10-CM | POA: Insufficient documentation

## 2018-05-03 DIAGNOSIS — N993 Prolapse of vaginal vault after hysterectomy: Secondary | ICD-10-CM | POA: Diagnosis not present

## 2018-05-03 DIAGNOSIS — K209 Esophagitis, unspecified: Secondary | ICD-10-CM | POA: Diagnosis not present

## 2018-05-03 HISTORY — PX: CYSTOCELE REPAIR: SHX163

## 2018-05-03 HISTORY — PX: CYSTOSCOPY: SHX5120

## 2018-05-03 HISTORY — PX: VAGINAL PROLAPSE REPAIR: SHX830

## 2018-05-03 LAB — TYPE AND SCREEN
ABO/RH(D): B POS
ANTIBODY SCREEN: NEGATIVE

## 2018-05-03 SURGERY — COLPORRHAPHY, ANTERIOR, FOR CYSTOCELE REPAIR
Anesthesia: General | Site: Perineum

## 2018-05-03 MED ORDER — MIDAZOLAM HCL 5 MG/5ML IJ SOLN
INTRAMUSCULAR | Status: DC | PRN
  Administered 2018-05-03: 2 mg via INTRAVENOUS

## 2018-05-03 MED ORDER — CLINDAMYCIN PHOSPHATE 600 MG/50ML IV SOLN
600.0000 mg | Freq: Once | INTRAVENOUS | Status: AC
  Administered 2018-05-03: 600 mg via INTRAVENOUS
  Filled 2018-05-03: qty 50

## 2018-05-03 MED ORDER — HYDROCODONE-ACETAMINOPHEN 5-325 MG PO TABS: 1.0000 | ORAL_TABLET | ORAL | 0 refills | Status: AC | PRN

## 2018-05-03 MED ORDER — OXYBUTYNIN CHLORIDE 5 MG PO TABS: 5.0000 mg | ORAL_TABLET | Freq: Three times a day (TID) | ORAL | Status: DC | PRN

## 2018-05-03 MED ORDER — SUGAMMADEX SODIUM 200 MG/2ML IV SOLN
INTRAVENOUS | Status: AC
  Filled 2018-05-03: qty 2

## 2018-05-03 MED ORDER — PROPOFOL 10 MG/ML IV BOLUS
INTRAVENOUS | Status: DC | PRN
  Administered 2018-05-03: 120 mg via INTRAVENOUS

## 2018-05-03 MED ORDER — PHENAZOPYRIDINE HCL 200 MG PO TABS
200.0000 mg | ORAL_TABLET | ORAL | Status: AC
  Administered 2018-05-03: 200 mg via ORAL
  Filled 2018-05-03: qty 1

## 2018-05-03 MED ORDER — ONDANSETRON HCL 4 MG/2ML IJ SOLN
INTRAMUSCULAR | Status: DC | PRN
  Administered 2018-05-03: 4 mg via INTRAVENOUS

## 2018-05-03 MED ORDER — GENTAMICIN SULFATE 40 MG/ML IJ SOLN
5.0000 mg/kg | Freq: Once | INTRAVENOUS | Status: DC
  Filled 2018-05-03: qty 8

## 2018-05-03 MED ORDER — GABAPENTIN 300 MG PO CAPS
300.0000 mg | ORAL_CAPSULE | Freq: Once | ORAL | Status: AC
  Administered 2018-05-03: 300 mg via ORAL
  Filled 2018-05-03: qty 1

## 2018-05-03 MED ORDER — LACTATED RINGERS IV SOLN
INTRAVENOUS | Status: DC
  Administered 2018-05-03 (×2): via INTRAVENOUS

## 2018-05-03 MED ORDER — FENTANYL CITRATE (PF) 100 MCG/2ML IJ SOLN
INTRAMUSCULAR | Status: DC | PRN
  Administered 2018-05-03: 50 ug via INTRAVENOUS
  Administered 2018-05-03 (×2): 25 ug via INTRAVENOUS

## 2018-05-03 MED ORDER — ONDANSETRON HCL 4 MG/2ML IJ SOLN
INTRAMUSCULAR | Status: AC
  Filled 2018-05-03: qty 2

## 2018-05-03 MED ORDER — PROMETHAZINE HCL 25 MG/ML IJ SOLN: 6.2500 mg | INTRAMUSCULAR | Status: DC | PRN

## 2018-05-03 MED ORDER — DOCUSATE SODIUM 100 MG PO CAPS
100.0000 mg | ORAL_CAPSULE | Freq: Two times a day (BID) | ORAL | Status: DC
  Administered 2018-05-03 – 2018-05-04 (×3): 100 mg via ORAL
  Filled 2018-05-03 (×2): qty 1

## 2018-05-03 MED ORDER — HYDROMORPHONE HCL 1 MG/ML IJ SOLN: 0.2500 mg | INTRAMUSCULAR | Status: DC | PRN

## 2018-05-03 MED ORDER — MEPERIDINE HCL 50 MG/ML IJ SOLN: 6.2500 mg | INTRAMUSCULAR | Status: DC | PRN

## 2018-05-03 MED ORDER — DEXAMETHASONE SODIUM PHOSPHATE 10 MG/ML IJ SOLN
INTRAMUSCULAR | Status: AC
  Filled 2018-05-03: qty 1

## 2018-05-03 MED ORDER — LIDOCAINE-EPINEPHRINE 1 %-1:100000 IJ SOLN
INTRAMUSCULAR | Status: AC
  Filled 2018-05-03: qty 1

## 2018-05-03 MED ORDER — ROCURONIUM BROMIDE 10 MG/ML (PF) SYRINGE
PREFILLED_SYRINGE | INTRAVENOUS | Status: DC | PRN
  Administered 2018-05-03 (×2): 10 mg via INTRAVENOUS
  Administered 2018-05-03: 50 mg via INTRAVENOUS

## 2018-05-03 MED ORDER — ACETAMINOPHEN 500 MG PO TABS
1000.0000 mg | ORAL_TABLET | Freq: Once | ORAL | Status: AC
  Administered 2018-05-03: 1000 mg via ORAL
  Filled 2018-05-03: qty 2

## 2018-05-03 MED ORDER — ACETAMINOPHEN 10 MG/ML IV SOLN: 1000.0000 mg | Freq: Once | INTRAVENOUS | Status: DC | PRN

## 2018-05-03 MED ORDER — MIDAZOLAM HCL 2 MG/2ML IJ SOLN
INTRAMUSCULAR | Status: AC
  Filled 2018-05-03: qty 2

## 2018-05-03 MED ORDER — HYDROCODONE-ACETAMINOPHEN 7.5-325 MG PO TABS: 1.0000 | ORAL_TABLET | Freq: Once | ORAL | Status: DC | PRN

## 2018-05-03 MED ORDER — LIDOCAINE-EPINEPHRINE (PF) 1 %-1:200000 IJ SOLN
INTRAMUSCULAR | Status: DC | PRN
  Administered 2018-05-03: 26 mL via INTRADERMAL

## 2018-05-03 MED ORDER — GENTAMICIN SULFATE 40 MG/ML IJ SOLN
5.0000 mg/kg | Freq: Once | INTRAVENOUS | Status: AC
  Administered 2018-05-04: 320 mg via INTRAVENOUS
  Filled 2018-05-03: qty 8

## 2018-05-03 MED ORDER — LEVOTHYROXINE SODIUM 50 MCG PO TABS
175.0000 ug | ORAL_TABLET | Freq: Every day | ORAL | Status: DC
  Administered 2018-05-04: 175 ug via ORAL
  Filled 2018-05-03: qty 1

## 2018-05-03 MED ORDER — PHENYLEPHRINE 40 MCG/ML (10ML) SYRINGE FOR IV PUSH (FOR BLOOD PRESSURE SUPPORT)
PREFILLED_SYRINGE | INTRAVENOUS | Status: DC | PRN
  Administered 2018-05-03: 80 ug via INTRAVENOUS
  Administered 2018-05-03 (×2): 40 ug via INTRAVENOUS

## 2018-05-03 MED ORDER — SODIUM CHLORIDE 0.9 % IV SOLN
INTRAVENOUS | Status: AC
  Filled 2018-05-03: qty 500000

## 2018-05-03 MED ORDER — FENTANYL CITRATE (PF) 100 MCG/2ML IJ SOLN
INTRAMUSCULAR | Status: AC
  Filled 2018-05-03: qty 2

## 2018-05-03 MED ORDER — ONDANSETRON HCL 4 MG/2ML IJ SOLN: 4.0000 mg | INTRAMUSCULAR | Status: DC | PRN

## 2018-05-03 MED ORDER — STERILE WATER FOR IRRIGATION IR SOLN
Status: DC | PRN
  Administered 2018-05-03: 500 mL

## 2018-05-03 MED ORDER — LIDOCAINE 2% (20 MG/ML) 5 ML SYRINGE
INTRAMUSCULAR | Status: DC | PRN
  Administered 2018-05-03: 10 mg via INTRAVENOUS

## 2018-05-03 MED ORDER — SUGAMMADEX SODIUM 200 MG/2ML IV SOLN
INTRAVENOUS | Status: DC | PRN
  Administered 2018-05-03: 150 mg via INTRAVENOUS

## 2018-05-03 MED ORDER — OXYCODONE HCL 5 MG PO TABS: 5.0000 mg | ORAL_TABLET | ORAL | Status: DC | PRN

## 2018-05-03 MED ORDER — CLINDAMYCIN PHOSPHATE 2 % VA CREA
TOPICAL_CREAM | VAGINAL | Status: AC
  Filled 2018-05-03: qty 80

## 2018-05-03 MED ORDER — SODIUM CHLORIDE 0.9 % IV SOLN
INTRAVENOUS | Status: DC | PRN
  Administered 2018-05-03: 500 mL

## 2018-05-03 MED ORDER — ACETAMINOPHEN 325 MG PO TABS
650.0000 mg | ORAL_TABLET | Freq: Four times a day (QID) | ORAL | Status: DC
  Administered 2018-05-03 – 2018-05-04 (×4): 650 mg via ORAL
  Filled 2018-05-03 (×4): qty 2

## 2018-05-03 MED ORDER — SODIUM CHLORIDE 0.9 % IR SOLN
Status: DC | PRN
  Administered 2018-05-03: 3000 mL

## 2018-05-03 MED ORDER — CLINDAMYCIN PHOSPHATE 600 MG/50ML IV SOLN
600.0000 mg | INTRAVENOUS | Status: AC
  Administered 2018-05-03: 600 mg via INTRAVENOUS
  Filled 2018-05-03: qty 50

## 2018-05-03 MED ORDER — EPHEDRINE 5 MG/ML INJ
INTRAVENOUS | Status: AC
  Filled 2018-05-03: qty 10

## 2018-05-03 MED ORDER — PROPOFOL 10 MG/ML IV BOLUS
INTRAVENOUS | Status: AC
  Filled 2018-05-03: qty 20

## 2018-05-03 MED ORDER — FLUORESCEIN SODIUM 10 % IV SOLN
INTRAVENOUS | Status: AC
  Filled 2018-05-03: qty 5

## 2018-05-03 MED ORDER — SODIUM CHLORIDE 0.9 % IV SOLN
INTRAVENOUS | Status: DC
  Administered 2018-05-03: 13:00:00 via INTRAVENOUS

## 2018-05-03 MED ORDER — DEXAMETHASONE SODIUM PHOSPHATE 10 MG/ML IJ SOLN
INTRAMUSCULAR | Status: DC | PRN
  Administered 2018-05-03: 10 mg via INTRAVENOUS

## 2018-05-03 MED ORDER — CLINDAMYCIN PHOSPHATE 2 % VA CREA
TOPICAL_CREAM | VAGINAL | Status: DC | PRN
  Administered 2018-05-03: 2 via VAGINAL

## 2018-05-03 SURGICAL SUPPLY — 51 items
ALLOGRAFT TUTOPLAST AXIS 6X12 (Tissue) ×1 IMPLANT
BAG URINE DRAINAGE (UROLOGICAL SUPPLIES) IMPLANT
BAG URO CATCHER STRL LF (MISCELLANEOUS) ×2 IMPLANT
BLADE SURG 15 STRL LF DISP TIS (BLADE) ×1 IMPLANT
BLADE SURG 15 STRL SS (BLADE) ×2
CATH FOLEY 2W COUNCIL 5CC 18FR (CATHETERS) IMPLANT
CATH FOLEY 2WAY SLVR  5CC 14FR (CATHETERS) ×1
CATH FOLEY 2WAY SLVR 5CC 14FR (CATHETERS) ×1 IMPLANT
CLOTH BEACON ORANGE TIMEOUT ST (SAFETY) ×2 IMPLANT
COVER FOOTSWITCH UNIV (MISCELLANEOUS) IMPLANT
COVER MAYO STAND STRL (DRAPES) ×1 IMPLANT
COVER SURGICAL LIGHT HANDLE (MISCELLANEOUS) ×2 IMPLANT
DEVICE CAPIO SLIM SINGLE (INSTRUMENTS) ×2 IMPLANT
DRAIN PENROSE 18X1/4 LTX STRL (WOUND CARE) ×2 IMPLANT
DRAPE SHEET LG 3/4 BI-LAMINATE (DRAPES) ×2 IMPLANT
ELECT PENCIL ROCKER SW 15FT (MISCELLANEOUS) ×2 IMPLANT
GAUZE 4X4 16PLY RFD (DISPOSABLE) ×5 IMPLANT
GAUZE PACKING 2X5 YD STRL (GAUZE/BANDAGES/DRESSINGS) ×2 IMPLANT
GLOVE BIO SURGEON STRL SZ 6.5 (GLOVE) ×2 IMPLANT
GLOVE BIOGEL M STRL SZ7.5 (GLOVE) ×2 IMPLANT
GLOVE ECLIPSE 8.5 STRL (GLOVE) ×2 IMPLANT
GOWN STRL REUS W/TWL XL LVL3 (GOWN DISPOSABLE) ×2 IMPLANT
HOLDER FOLEY CATH W/STRAP (MISCELLANEOUS) ×2 IMPLANT
IV NS 1000ML (IV SOLUTION) ×2
IV NS 1000ML BAXH (IV SOLUTION) ×1 IMPLANT
KIT BASIN OR (CUSTOM PROCEDURE TRAY) ×2 IMPLANT
NDL MAYO 6 CRC TAPER PT (NEEDLE) ×1 IMPLANT
NEEDLE HYPO 22GX1.5 SAFETY (NEEDLE) ×1 IMPLANT
NEEDLE MAYO 6 CRC TAPER PT (NEEDLE) ×2 IMPLANT
NS IRRIG 1000ML POUR BTL (IV SOLUTION) ×2 IMPLANT
PACK CYSTO (CUSTOM PROCEDURE TRAY) ×2 IMPLANT
PLUG CATH AND CAP STER (CATHETERS) ×2 IMPLANT
RETRACTOR STAY HOOK 5MM (MISCELLANEOUS) ×2 IMPLANT
SHEET LAVH (DRAPES) ×2 IMPLANT
SUT CAPIO ETHIBPND (SUTURE) ×2 IMPLANT
SUT VIC AB 0 CT1 27 (SUTURE) ×2
SUT VIC AB 0 CT1 27XBRD ANTBC (SUTURE) ×1 IMPLANT
SUT VIC AB 2-0 CT1 27 (SUTURE) ×4
SUT VIC AB 2-0 CT1 27XBRD (SUTURE) ×2 IMPLANT
SUT VIC AB 2-0 SH 27 (SUTURE) ×4
SUT VIC AB 2-0 SH 27X BRD (SUTURE) ×2 IMPLANT
SUT VIC AB 3-0 SH 27 (SUTURE) ×4
SUT VIC AB 3-0 SH 27XBRD (SUTURE) ×2 IMPLANT
SUT VICRYL 0 UR6 27IN ABS (SUTURE) ×4 IMPLANT
SYR 10ML LL (SYRINGE) ×2 IMPLANT
TOWEL OR 17X26 10 PK STRL BLUE (TOWEL DISPOSABLE) ×2 IMPLANT
TOWEL OR NON WOVEN STRL DISP B (DISPOSABLE) ×2 IMPLANT
TUBING CONNECTING 10 (TUBING) ×2 IMPLANT
TUTOPLAST AXIS 6X12 (Tissue) ×2 IMPLANT
WATER STERILE IRR 1000ML POUR (IV SOLUTION) ×2 IMPLANT
YANKAUER SUCT BULB TIP 10FT TU (MISCELLANEOUS) ×2 IMPLANT

## 2018-05-03 NOTE — Progress Notes (Signed)
No pain Vitals normal  Looks good Surgery discussed

## 2018-05-03 NOTE — Anesthesia Procedure Notes (Signed)
Procedure Name: Intubation Date/Time: 05/03/2018 7:41 AM Performed by: Lind Covert, CRNA Pre-anesthesia Checklist: Patient identified, Emergency Drugs available, Suction available, Patient being monitored and Timeout performed Patient Re-evaluated:Patient Re-evaluated prior to induction Oxygen Delivery Method: Circle system utilized Preoxygenation: Pre-oxygenation with 100% oxygen Induction Type: IV induction Ventilation: Mask ventilation without difficulty Laryngoscope Size: Mac and 4 Grade View: Grade I Tube type: Oral Tube size: 7.0 mm Number of attempts: 1 Airway Equipment and Method: Stylet Placement Confirmation: ETT inserted through vocal cords under direct vision,  positive ETCO2 and breath sounds checked- equal and bilateral Secured at: 21 cm Tube secured with: Tape Dental Injury: Teeth and Oropharynx as per pre-operative assessment

## 2018-05-03 NOTE — Anesthesia Postprocedure Evaluation (Signed)
Anesthesia Post Note  Patient: Maria Simmons  Procedure(s) Performed: ANTERIOR REPAIR (CYSTOCELE) (N/A Perineum) VAULT PROLAPSE REPAIR WITH GRAFT (N/A Perineum) CYSTOSCOPY (N/A Perineum)     Patient location during evaluation: PACU Anesthesia Type: General Level of consciousness: awake and alert Pain management: pain level controlled Vital Signs Assessment: post-procedure vital signs reviewed and stable Respiratory status: spontaneous breathing, nonlabored ventilation, respiratory function stable and patient connected to nasal cannula oxygen Cardiovascular status: blood pressure returned to baseline and stable Postop Assessment: no apparent nausea or vomiting Anesthetic complications: no    Last Vitals:  Vitals:   05/03/18 1200 05/03/18 1230  BP: 107/68 115/81  Pulse: (!) 59 65  Resp: 16 15  Temp: (!) 36.3 C 36.6 C  SpO2: 100% 100%    Last Pain:  Vitals:   05/03/18 1200  TempSrc:   PainSc: 0-No pain                 Barnet Glasgow

## 2018-05-03 NOTE — Progress Notes (Signed)
Patient is admited to [id number] from PACU. Admission VS is stable. Fraser Din is [id number]

## 2018-05-03 NOTE — Interval H&P Note (Signed)
History and Physical Interval Note:  05/03/2018 7:11 AM  Maria Simmons  has presented today for surgery, with the diagnosis of CYSTOCELE VAULT PROLAPSE  The various methods of treatment have been discussed with the patient and family. After consideration of risks, benefits and other options for treatment, the patient has consented to  Procedure(s): ANTERIOR REPAIR (CYSTOCELE) (N/A) VAULT PROLAPSE REPAIR WITH GRAFT (N/A) CYSTOSCOPY (N/A) as a surgical intervention .  The patient's history has been reviewed, patient examined, no change in status, stable for surgery.  I have reviewed the patient's chart and labs.  Questions were answered to the patient's satisfaction.     [name]

## 2018-05-03 NOTE — Transfer of Care (Signed)
Immediate Anesthesia Transfer of Care Note  Patient: Maria Simmons  Procedure(s) Performed: ANTERIOR REPAIR (CYSTOCELE) (N/A Perineum) VAULT PROLAPSE REPAIR WITH GRAFT (N/A Perineum) CYSTOSCOPY (N/A Perineum)  Patient Location: PACU  Anesthesia Type:General  Level of Consciousness: sedated  Airway & Oxygen Therapy: Patient Spontanous Breathing and Patient connected to face mask oxygen  Post-op Assessment: Report given to RN and Post -op Vital signs reviewed and stable  Post vital signs: Reviewed and stable  Last Vitals:  Vitals Value Taken Time  BP    Temp    Pulse 83 05/03/2018 10:16 AM  Resp    SpO2 100 % 05/03/2018 10:16 AM  Vitals shown include unvalidated device data.  Last Pain:  Vitals:   05/03/18 0554  TempSrc:   PainSc: 0-No pain         Complications: No apparent anesthesia complications

## 2018-05-03 NOTE — Op Note (Signed)
Preoperative diagnosis: Cystocele and vault prolapse Postoperative diagnosis: Cystocele and vault prolapse Surgery: Vault prolapse repair and cystocele repair and graft and cystoscopy Surgeon: Dr. Nicki Reaper Mcdiarmid  The patient has the above diagnosis and consented to the above procedure.  Extra care was taken with leg positioning to minimize the risk compartment syndrome and neuropathy and deep vein thrombosis.  Preoperative antibiotics given.  Under anesthesia her vaginal cuff descended from 8 or 9 cm to approximately 6 cm.  The support was reasonable.  The cystocele reached just beyond the urethrovesical angle with a modest central defect  I instilled 25 cc of lidocaine epinephrine mixture.  I made my usual T-shaped incision beginning at the apex marked with 3-0 Vicryl.  I used Allis clamps.  I sharply mobilized underlying pubocervical fascia from the overlying vaginal epithelium.  I mobilized to the white line bilaterally.  I was pleased with my mobilization at the apex.  I did a 2 layer anterior repair not imbricating the bladder neck.  It was anatomic and I kept excellent length.  I did not distort the anatomy.  The patient was cystoscope.  Cystoscopically she had an excellent repair with no bladder injury.  Ureteral orifices were normal.  She had efflux bilaterally of yellow dye.  Dissecting along the appropriate plane I dissected down at the apex towards the ischial spine bilaterally.  A sweep soft tissue medially.  I could feel the ligament medially.  With the Capio device I placed a 0 Ethibond 1 fingerbreadth medial to the spine in a straight line between the spines.  I was very happy with the placement and I triple checked it.  I did a digital rectal examination and there was no suture in the rectum and the tubes were in good position  I placed a 0 Vicryl with a UR 6 needle into the pelvic sidewall at the urethrovesical angle bilaterally with my usual technique  I cut a 10 x 6 biologic 1  pack in the shape of the trapezoid. It was sewn in between the 4 sutures tension-free.  I trimmed an appropriate amount of anterior vaginal wall and closed anterior vaginal wall with running 2-0 Vicryl on a CT1 needle.   There was excellent length and very good support anteriorly.  She had a little bit of a midline cone from redundant vaginal epithelium but no narrowing of the cuff.  The midline cone was due to redundant epithelium and not from sutures.  Estrogen pack was applied.  Leg position was excellent.  Urine output was very good.  Blood loss was less than 50 mL.  I was very pleased with the surgery and hopefully will reach her treatment goal

## 2018-05-04 ENCOUNTER — Encounter (HOSPITAL_COMMUNITY): Payer: Self-pay | Admitting: Urology

## 2018-05-04 ENCOUNTER — Other Ambulatory Visit: Payer: Self-pay

## 2018-05-04 DIAGNOSIS — N8111 Cystocele, midline: Secondary | ICD-10-CM | POA: Diagnosis not present

## 2018-05-04 DIAGNOSIS — D649 Anemia, unspecified: Secondary | ICD-10-CM | POA: Diagnosis not present

## 2018-05-04 DIAGNOSIS — E039 Hypothyroidism, unspecified: Secondary | ICD-10-CM | POA: Diagnosis not present

## 2018-05-04 DIAGNOSIS — Z79899 Other long term (current) drug therapy: Secondary | ICD-10-CM | POA: Diagnosis not present

## 2018-05-04 DIAGNOSIS — Z86718 Personal history of other venous thrombosis and embolism: Secondary | ICD-10-CM | POA: Diagnosis not present

## 2018-05-04 DIAGNOSIS — Z85850 Personal history of malignant neoplasm of thyroid: Secondary | ICD-10-CM | POA: Diagnosis not present

## 2018-05-04 DIAGNOSIS — Z880 Allergy status to penicillin: Secondary | ICD-10-CM | POA: Diagnosis not present

## 2018-05-04 DIAGNOSIS — N3941 Urge incontinence: Secondary | ICD-10-CM | POA: Diagnosis not present

## 2018-05-04 LAB — HIV ANTIBODY (ROUTINE TESTING W REFLEX): HIV Screen 4th Generation wRfx: NONREACTIVE

## 2018-05-04 LAB — BASIC METABOLIC PANEL
ANION GAP: 10 (ref 5–15)
BUN: 10 mg/dL (ref 6–20)
CHLORIDE: 103 mmol/L (ref 101–111)
CO2: 27 mmol/L (ref 22–32)
Calcium: 8.8 mg/dL — ABNORMAL LOW (ref 8.9–10.3)
Creatinine, Ser: 0.94 mg/dL (ref 0.44–1.00)
GFR calc non Af Amer: >60 mL/min (ref >60)
Glucose, Bld: 121 mg/dL — ABNORMAL HIGH (ref 65–99)
POTASSIUM: 3.7 mmol/L (ref 3.5–5.1)
Sodium: 140 mmol/L (ref 135–145)

## 2018-05-04 LAB — CBC
HEMATOCRIT: 36 % (ref 36.0–46.0)
Hemoglobin: 11.6 g/dL — ABNORMAL LOW (ref 12.0–15.0)
MCH: 28.2 pg (ref 26.0–34.0)
MCHC: 32.2 g/dL (ref 30.0–36.0)
MCV: 87.6 fL (ref 78.0–100.0)
PLATELETS: 339 10*3/uL (ref 150–400)
RBC: 4.11 MIL/uL (ref 3.87–5.11)
RDW: 13.6 % (ref 11.5–15.5)
WBC: 9 10*3/uL (ref 4.0–10.5)

## 2018-05-04 MED FILL — HYDROCODON-APAP 5-325: 5-325 | 5 days supply | Qty: 15 | Fill #0

## 2018-05-04 NOTE — Progress Notes (Signed)
Looks good  Vitals good Labs normal Post op detailed

## 2018-05-04 NOTE — Discharge Summary (Signed)
Date of admission: 05/03/2018  Date of discharge: 05/04/2018  Admission diagnosis: midline cystocele   Discharge diagnosis: midline cystocele  Secondary diagnoses: vault prolapse  History and Physical: For full details, please see admission history and physical. Briefly, Maria Simmons is a 52 y.o. year old patient with the above diagnosis.   Hospital Course: Vault prolapse repair and cystocele repair and graft. Excellent post op course  Laboratory values:  Recent Labs    05/04/18 0437  HGB 11.6*  HCT 36.0   Recent Labs    05/04/18 0437  CREATININE 0.94    Disposition: Home  Discharge instruction: The patient was instructed to be ambulatory but told to refrain from heavy lifting, strenuous activity, or driving. Detailed  Discharge medications:  Allergies as of 05/04/2018      Reactions   Metformin And Related Rash   Fingers turned  Blue.   Tape Itching   Surgical tape.   Levaquin [levofloxacin] Rash   Penicillins Rash   Has patient had a PCN reaction causing immediate rash, facial/tongue/throat swelling, SOB or lightheadedness with hypotension: No Has patient had a PCN reaction causing severe rash involving mucus membranes or skin necrosis: No Has patient had a PCN reaction that required hospitalization No Has patient had a PCN reaction occurring within the last 10 years: No If all of the above answers are "NO", then may proceed with Cephalosporin use.      Medication List    TAKE these medications   Biotin 5000 MCG Tabs Take 1 tablet by mouth daily. Reported on 02/18/2016   HYDROcodone-acetaminophen 5-325 MG tablet Commonly known as:  NORCO Take 1 tablet by mouth every 4 (four) hours as needed for up to 5 days for moderate pain.   levothyroxine 175 MCG tablet Commonly known as:  SYNTHROID, LEVOTHROID Take 175 mcg by mouth daily before breakfast.   levothyroxine 137 MCG tablet Commonly known as:  SYNTHROID, LEVOTHROID Take 137 mcg by mouth daily before  breakfast.   multivitamin with minerals Tabs tablet Take 1 tablet by mouth every morning.   vitamin B-12 500 MCG tablet Commonly known as:  CYANOCOBALAMIN Take 500 mcg by mouth daily with lunch.   Vitamin D (Cholecalciferol) 1000 units Tabs Take 1,000 Units by mouth daily with lunch.       Followup:  Follow-up Information    Roark Rufo, Nicki Reaper, MD Follow up.   Specialty:  Urology Why:  see as scheduled Contact information: [place] [place] [address] [phone]

## 2018-05-04 NOTE — Plan of Care (Signed)
Discharge instructions reviewed with patient, questions answered, verbalized understanding.  Patient ambulatory to main entrance accompanied by RN to be taken home by daughter.  Prescription for pain medication and all belongings in patients possession at time of discharge.

## 2018-05-04 NOTE — Progress Notes (Signed)
Patient called RN to room stating she had voided, approximately 200 ccs yellow urine with small blood tinging noted in white container.  Bladder scan performed as per Dr. McDiarmid's instructions, only 48 ccs noted on bladder scan.  Patient without complaint.

## 2018-05-04 NOTE — Discharge Instructions (Signed)
I have reviewed discharge instructions in detail with the patient. They will follow-up with me or their physician as scheduled. My nurse will also be calling the patients as per protocol.   

## 2018-05-04 NOTE — Progress Notes (Signed)
Foley catheter discontinued as per Dr. Mikle Bosworth order.  Patient verbalizes understanding that when she voids she is to notify RN so a bladder scan can be performed to determine PVR.

## 2018-05-11 DIAGNOSIS — N8111 Cystocele, midline: Secondary | ICD-10-CM | POA: Diagnosis not present

## 2018-09-14 MED FILL — SYNTHROID 137 MCG TABLET: 137 | 90 days supply | Qty: 90 | Fill #1

## 2018-10-13 ENCOUNTER — Telehealth: Payer: Self-pay | Admitting: Hematology and Oncology

## 2018-10-13 NOTE — Telephone Encounter (Signed)
NG out 10/30 thru 11/29 - moved 11/5 lab/fu to 12/13. Left message for patient and mailed schedule. Patient also my chart active.

## 2018-10-18 ENCOUNTER — Inpatient Hospital Stay: Payer: 59

## 2018-10-18 ENCOUNTER — Inpatient Hospital Stay: Payer: 59 | Admitting: Hematology and Oncology

## 2018-10-22 ENCOUNTER — Emergency Department (HOSPITAL_COMMUNITY): Payer: 59

## 2018-10-22 ENCOUNTER — Observation Stay (HOSPITAL_COMMUNITY)
Admission: EM | Admit: 2018-10-22 | Discharge: 2018-10-24 | Disposition: A | Discharge status: Home / Self Care | Payer: 59 | Attending: Internal Medicine | Admitting: Internal Medicine

## 2018-10-22 ENCOUNTER — Other Ambulatory Visit: Payer: Self-pay

## 2018-10-22 ENCOUNTER — Encounter (HOSPITAL_COMMUNITY): Payer: Self-pay | Admitting: Internal Medicine

## 2018-10-22 DIAGNOSIS — Z96642 Presence of left artificial hip joint: Secondary | ICD-10-CM | POA: Diagnosis not present

## 2018-10-22 DIAGNOSIS — Z8572 Personal history of non-Hodgkin lymphomas: Secondary | ICD-10-CM

## 2018-10-22 DIAGNOSIS — R1030 Lower abdominal pain, unspecified: Secondary | ICD-10-CM | POA: Diagnosis not present

## 2018-10-22 DIAGNOSIS — K922 Gastrointestinal hemorrhage, unspecified: Secondary | ICD-10-CM | POA: Diagnosis not present

## 2018-10-22 DIAGNOSIS — Z85850 Personal history of malignant neoplasm of thyroid: Secondary | ICD-10-CM | POA: Insufficient documentation

## 2018-10-22 DIAGNOSIS — E039 Hypothyroidism, unspecified: Secondary | ICD-10-CM | POA: Diagnosis not present

## 2018-10-22 DIAGNOSIS — K529 Noninfective gastroenteritis and colitis, unspecified: Secondary | ICD-10-CM | POA: Diagnosis not present

## 2018-10-22 DIAGNOSIS — K625 Hemorrhage of anus and rectum: Secondary | ICD-10-CM | POA: Diagnosis present

## 2018-10-22 DIAGNOSIS — Z79899 Other long term (current) drug therapy: Secondary | ICD-10-CM | POA: Diagnosis not present

## 2018-10-22 DIAGNOSIS — R739 Hyperglycemia, unspecified: Secondary | ICD-10-CM | POA: Diagnosis present

## 2018-10-22 LAB — TSH: TSH: 2.78 u[IU]/mL (ref 0.350–4.500)

## 2018-10-22 LAB — URINALYSIS, ROUTINE W REFLEX MICROSCOPIC
BILIRUBIN URINE: NEGATIVE
Glucose, UA: NEGATIVE mg/dL
Ketones, ur: NEGATIVE mg/dL
Leukocytes, UA: NEGATIVE
NITRITE: NEGATIVE
PH: 7 (ref 5.0–8.0)
Protein, ur: NEGATIVE mg/dL
SPECIFIC GRAVITY, URINE: 1.006 (ref 1.005–1.030)

## 2018-10-22 LAB — COMPREHENSIVE METABOLIC PANEL
ALT: 19 U/L (ref 0–44)
AST: 29 U/L (ref 15–41)
Albumin: 4.5 g/dL (ref 3.5–5.0)
Alkaline Phosphatase: 94 U/L (ref 38–126)
Anion gap: 12 (ref 5–15)
BILIRUBIN TOTAL: 0.9 mg/dL (ref 0.3–1.2)
BUN: 8 mg/dL (ref 6–20)
CHLORIDE: 96 mmol/L — AB (ref 98–111)
CO2: 27 mmol/L (ref 22–32)
CREATININE: 0.81 mg/dL (ref 0.44–1.00)
Calcium: 9.3 mg/dL (ref 8.9–10.3)
Glucose, Bld: 162 mg/dL — ABNORMAL HIGH (ref 70–99)
POTASSIUM: 3.8 mmol/L (ref 3.5–5.1)
Sodium: 135 mmol/L (ref 135–145)
Total Protein: 8.6 g/dL — ABNORMAL HIGH (ref 6.5–8.1)

## 2018-10-22 LAB — CBC
HEMATOCRIT: 41.8 % (ref 36.0–46.0)
HEMOGLOBIN: 13.6 g/dL (ref 12.0–15.0)
MCH: 27.6 pg (ref 26.0–34.0)
MCHC: 32.5 g/dL (ref 30.0–36.0)
MCV: 85 fL (ref 80.0–100.0)
NRBC: 0 % (ref 0.0–0.2)
Platelets: 338 10*3/uL (ref 150–400)
RBC: 4.92 MIL/uL (ref 3.87–5.11)
RDW: 14 % (ref 11.5–15.5)
WBC: 6.6 10*3/uL (ref 4.0–10.5)

## 2018-10-22 LAB — HEMOGLOBIN: HEMOGLOBIN: 11.9 g/dL — AB (ref 12.0–15.0)

## 2018-10-22 LAB — LIPASE, BLOOD: Lipase: 23 U/L (ref 11–51)

## 2018-10-22 LAB — TYPE AND SCREEN
ABO/RH(D): B POS
ANTIBODY SCREEN: NEGATIVE

## 2018-10-22 LAB — MAGNESIUM: MAGNESIUM: 1.9 mg/dL (ref 1.7–2.4)

## 2018-10-22 LAB — POC OCCULT BLOOD, ED: Fecal Occult Bld: NEGATIVE

## 2018-10-22 MED ORDER — SODIUM CHLORIDE 0.9 % IV SOLN
INTRAVENOUS | Status: DC
  Administered 2018-10-22: 18:00:00 via INTRAVENOUS

## 2018-10-22 MED ORDER — SODIUM CHLORIDE 0.9 % IV SOLN
INTRAVENOUS | Status: DC
  Administered 2018-10-22: 23:00:00 via INTRAVENOUS

## 2018-10-22 MED ORDER — SODIUM CHLORIDE 0.9 % IV BOLUS
500.0000 mL | Freq: Once | INTRAVENOUS | Status: AC
  Administered 2018-10-22: 500 mL via INTRAVENOUS

## 2018-10-22 MED ORDER — ACETAMINOPHEN 325 MG PO TABS
650.0000 mg | ORAL_TABLET | Freq: Four times a day (QID) | ORAL | Status: DC | PRN
  Filled 2018-10-22: qty 2

## 2018-10-22 MED ORDER — IOPAMIDOL (ISOVUE-300) INJECTION 61%
100.0000 mL | Freq: Once | INTRAVENOUS | Status: AC | PRN
  Administered 2018-10-22: 100 mL via INTRAVENOUS

## 2018-10-22 MED ORDER — ONDANSETRON HCL 4 MG/2ML IJ SOLN
4.0000 mg | Freq: Once | INTRAMUSCULAR | Status: AC
  Administered 2018-10-22: 4 mg via INTRAVENOUS
  Filled 2018-10-22: qty 2

## 2018-10-22 MED ORDER — HYDROMORPHONE HCL 1 MG/ML IJ SOLN
1.0000 mg | Freq: Once | INTRAMUSCULAR | Status: AC
  Administered 2018-10-22: 1 mg via INTRAVENOUS
  Filled 2018-10-22: qty 1

## 2018-10-22 MED ORDER — LEVOTHYROXINE SODIUM 137 MCG PO TABS
137.0000 ug | ORAL_TABLET | Freq: Every day | ORAL | Status: DC
  Administered 2018-10-23 – 2018-10-24 (×2): 137 ug via ORAL
  Filled 2018-10-22 (×2): qty 1

## 2018-10-22 MED ORDER — ONDANSETRON HCL 4 MG PO TABS: 4.0000 mg | ORAL_TABLET | Freq: Four times a day (QID) | ORAL | Status: DC | PRN

## 2018-10-22 MED ORDER — METRONIDAZOLE IN NACL 5-0.79 MG/ML-% IV SOLN
500.0000 mg | Freq: Three times a day (TID) | INTRAVENOUS | Status: DC
  Administered 2018-10-23 – 2018-10-24 (×5): 500 mg via INTRAVENOUS
  Filled 2018-10-22 (×5): qty 100

## 2018-10-22 MED ORDER — ONDANSETRON HCL 4 MG/2ML IJ SOLN: 4.0000 mg | Freq: Four times a day (QID) | INTRAMUSCULAR | Status: DC | PRN

## 2018-10-22 MED ORDER — ACETAMINOPHEN 650 MG RE SUPP: 650.0000 mg | Freq: Four times a day (QID) | RECTAL | Status: DC | PRN

## 2018-10-22 NOTE — ED Triage Notes (Signed)
Pt c/o severe abdominal pain, blood in stool (one episode of dark and others bright red), near syncope when standing, and chills.

## 2018-10-22 NOTE — ED Provider Notes (Signed)
[facility] EMERGENCY DEPARTMENT Provider Note   CSN: [id number] Arrival date & time: 10/22/18  1630     History   Chief Complaint Chief Complaint  Patient presents with  . Rectal Bleeding    HPI PAYSLIE MCCAIG is a 52 y.o. female.  Patient presents with lower abdominal crampy abdominal pain that started today.  Associated with several bloody bowel movements or black bowel movements.  No prior history of blood in bowel movements.  Patient estimates was probably about 6 of those today.  Patient's had a history of lymphoma.  Some thyroid problems.  But had a PET scan in the spring without any evidence of any recurrent disease.  Is followed by hematology oncology but not currently receiving any treatments.  Patient's had nausea may have vomited once but did not vomit any blood.  Patient states that she is felt lightheaded felt this is going to pass out.  But she did not pass out.     Past Medical History:  Diagnosis Date  . Arthritis of shoulder region, right, degenerative    right shoulder   . Bladder prolapse, female, acquired   . Cancer [facility]) 2007   thyroid, papillary  . Change in bowel habits 03/19/2016  . Constipation   . DLBCL (diffuse large B cell lymphoma) ([place])   . History of B-cell lymphoma 11/18/2015  . Hypothyroidism   . Neutropenic fever ([place]) 11/01/2014  . PONV (postoperative nausea and vomiting)    during hysterectomy   . S/P radiation therapy 02/05/15-02/27/15   tonsil/neck,lt subpectoral nodal bed 30.6Gy /17 fx  . Thyroid cancer ([place])   . Wears glasses     Patient Active Problem List   Diagnosis Date Noted  . Vaginal prolapse 05/03/2018  . Hoarseness of voice 04/18/2018  . Primary osteoarthritis of left hip 11/12/2017  . H/O laparoscopic adjustable gastric banding 09/24/2017  . B12 deficiency anemia 09/24/2017  . Iron deficiency anemia 09/24/2017  . Left hip pain 09/24/2017  . Esophagitis 06/25/2016  . Stye 06/25/2016  . Change in bowel habits  03/19/2016  . History of B-cell lymphoma 11/18/2015  . Right shoulder pain 01/07/2015  . Anemia in chronic illness 11/15/2014  . Chronic leukopenia 11/15/2014  . Neuropathy due to chemotherapeutic drug ([place]) 11/15/2014  . History of lymphoma 10/19/2014  . 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  . Leucopenia 09/02/2012    Past Surgical History:  Procedure Laterality Date  . ABDOMINAL HYSTERECTOMY  2000  . COLONOSCOPY    . COLONOSCOPY WITH PROPOFOL N/A 06/19/2016   Procedure: COLONOSCOPY WITH PROPOFOL;  Surgeon: Ronald Lobo, MD;  Location: [place];  Service: Endoscopy;  Laterality: N/A;  . CYSTOCELE REPAIR N/A 05/03/2018   Procedure: ANTERIOR REPAIR (CYSTOCELE);  Surgeon: Bjorn Loser, MD;  [facility];  Service: Urology;  Laterality: N/A;  . CYSTOSCOPY N/A 05/03/2018   Procedure: CYSTOSCOPY;  Surgeon: Bjorn Loser, MD;  [facility];  Service: Urology;  Laterality: N/A;  . ESOPHAGOGASTRODUODENOSCOPY (EGD) WITH PROPOFOL N/A 06/19/2016   Procedure: ESOPHAGOGASTRODUODENOSCOPY (EGD) WITH PROPOFOL;  Surgeon: Ronald Lobo, MD;  [facility] ENDOSCOPY;  Service: Endoscopy;  Laterality: N/A;  . LAPAROSCOPIC GASTRIC BANDING WITH HIATAL HERNIA REPAIR  05/28/2009  . LYMPH NODE BIOPSY Left 10/12/2014   Procedure: excisional biopsy deep left neck lymph node;  Surgeon: Fanny Skates, MD;  Location: [place];  Service: General;  Laterality: Left;  . PORT-A-CATH REMOVAL N/A 10/18/2015   Procedure: REMOVAL PORT-A-CATH;  Surgeon: Fanny Skates, MD;  Location:  [facility];  Service: General;  Laterality: N/A;  . PORTACATH PLACEMENT Right 10/24/2014   Procedure: INSERTION PORT-A-CATH WITH ULTRASOUND GUIDANCE;  Surgeon: Fanny Skates, MD;  Location: [place];  Service: General;  Laterality: Right;  . 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  [name] TOTAL HIP ARTHROPLASTY Left 11/12/2017   Procedure:  LEFT TOTAL HIP ARTHROPLASTY ANTERIOR APPROACH;  Surgeon: Dorna Leitz, MD;  [facility];  Service: Orthopedics;  Laterality: Left;  . TUBAL LIGATION  1990  . VAGINAL PROLAPSE REPAIR N/A 05/03/2018   Procedure: VAULT PROLAPSE REPAIR WITH GRAFT;  Surgeon: Bjorn Loser, MD;  [facility];  Service: Urology;  Laterality: N/A;     OB History    Gravida  3   Para  2   Term      Preterm      AB  1   Living  2     SAB  1   TAB      Ectopic      Multiple      Live Births               Home Medications    Prior to Admission medications   Medication Sig Start Date End Date Taking? Authorizing Provider  Biotin 5000 MCG TABS Take 1 tablet by mouth daily. Reported on 02/18/2016   Yes [provider]  cyanocobalamin 500 MCG tablet Take 500 mcg by mouth daily with lunch.    Yes [provider]  levothyroxine (SYNTHROID, LEVOTHROID) 137 MCG tablet Take 137 mcg by mouth daily before breakfast.   Yes [provider]  Multiple Vitamin (MULITIVITAMIN WITH MINERALS) TABS Take 1 tablet by mouth every morning.    Yes [provider]  Vitamin D, Cholecalciferol, 1000 UNITS TABS Take 1,000 Units by mouth daily with lunch.    Yes [provider]    Family History Family History  Problem Relation Age of Onset  . Colon cancer Mother   . Cancer Mother        colon ca  . Cancer Father        prostate  . Hypertension Father   . Hyperlipidemia Father   . Stroke Father   . Prostate cancer Father   . Cancer Paternal Grandmother        female organ ca    Social History Social History   Tobacco Use  . Smoking status: Never Smoker  . Smokeless tobacco: Never Used  Substance Use Topics  . Alcohol use: No  . Drug use: No     Allergies   Metformin and related; Tape; Levaquin [levofloxacin]; and Penicillins   Review of Systems Review of Systems  Constitutional: Negative for fever.  HENT: Negative for congestion.   Eyes:  Negative for redness.  Respiratory: Negative for shortness of breath.   Cardiovascular: Negative for chest pain.  Gastrointestinal: Positive for abdominal pain, blood in stool, nausea and vomiting.  Genitourinary: Negative for dysuria.  Musculoskeletal: Negative for back pain.  Neurological: Positive for light-headedness. Negative for headaches.  Psychiatric/Behavioral: Negative for confusion.     Physical Exam Updated Vital Signs BP (!) 107/59   Pulse (!) 58   Temp 98.7 F (37.1 C) (Oral)   Resp 16   Ht 1.638 m (5' 4.5")   Wt 77.1 kg   LMP 04/13/2000 (Exact Date)   SpO2 97%   BMI 28.73 kg/m   Physical Exam  Constitutional: She is oriented  to person, place, and time. She appears well-developed and well-nourished. No distress.  HENT:  Head: Normocephalic and atraumatic.  Mouth/Throat: Oropharynx is clear and moist.  Eyes: Pupils are equal, round, and reactive to light. Conjunctivae and EOM are normal.  Neck: Normal range of motion. Neck supple.  Cardiovascular: Normal rate, regular rhythm and normal heart sounds.  Pulmonary/Chest: Effort normal and breath sounds normal. No respiratory distress.  Abdominal: Soft. Bowel sounds are normal. She exhibits no distension. There is no tenderness.  Genitourinary: Rectal exam shows guaiac negative stool.  Musculoskeletal: Normal range of motion.  Neurological: She is alert and oriented to person, place, and time. No cranial nerve deficit or sensory deficit. She exhibits normal muscle tone. Coordination normal.  Skin: Skin is warm. No rash noted.  Nursing note and vitals reviewed.    ED Treatments / Results  Labs (all labs ordered are listed, but only abnormal results are displayed) Labs Reviewed  COMPREHENSIVE METABOLIC PANEL - Abnormal; Notable for the following components:      Result Value   Chloride 96 (*)    Glucose, Bld 162 (*)    Total Protein 8.6 (*)    All other components within normal limits  CBC  LIPASE, BLOOD    URINALYSIS, ROUTINE W REFLEX MICROSCOPIC  POC OCCULT BLOOD, ED  POC OCCULT BLOOD, ED  TYPE AND SCREEN    EKG None  Radiology No results found.  Procedures Procedures (including critical care time)  Medications Ordered in ED Medications  0.9 %  sodium chloride infusion ( Intravenous New Bag/Given 10/22/18 1805)  sodium chloride 0.9 % bolus 500 mL (0 mLs Intravenous Stopped 10/22/18 1836)  ondansetron (ZOFRAN) injection 4 mg (4 mg Intravenous Given 10/22/18 1805)  HYDROmorphone (DILAUDID) injection 1 mg (1 mg Intravenous Given 10/22/18 1805)     Initial Impression / Assessment and Plan / ED Course  I have reviewed the triage vital signs and the nursing notes.  Pertinent labs & imaging results that were available during my care of the patient were reviewed by me and considered in my medical decision making (see chart for details).    Rectal exam was actually done by the nurse.  And Hemoccult was negative.  The patient giving very good history of several bowel movements or either melena or red blood.  CT scan shows evidence of colitis so that would fit scenario.  Patient hemodynamically stable labs without significant abnormalities.  Hospitalist will admit for observation.  They will decide about starting her on antibiotics or not.  Patient very stable.   Final Clinical Impressions(s) / ED Diagnoses   Final diagnoses:  Gastrointestinal hemorrhage, unspecified gastrointestinal hemorrhage type  Lower abdominal pain    ED Discharge Orders    None       Fredia Sorrow, MD 10/22/18 2157

## 2018-10-22 NOTE — H&P (Signed)
History and Physical    THERESEA TRAUTMANN OZD:[id number] DOB: 1966/08/24 DOA: 10/22/2018  PCP: Servando Salina, MD   Patient coming from: Home.  I have personally briefly reviewed patient's old medical records in [place]  Chief Complaint: Rectal bleeding.  HPI: Maria Simmons is a 52 y.o. 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  She denies nausea, emesis, melena, dysuria, frequency or hematuria.  Fever, chills, but has felt some lightheadedness and a little fatigue.  No chest pain, dyspnea, palpitations, diaphoresis, PND, orthopnea or pitting edema of the lower extremities.  ED Course: Initial vital signs temperature 98.7 F, pulse 96, respirations 18, blood pressure 138/73 mmHg and O2 sat 100% on room air.  The patient received a 500 mL NS bolus, 4 mg of Zofran and 1 mg of hydromorphone in the emergency department which provided some relief of her symptoms.  Her white count 6.6, hemoglobin 13.6 g/dL and platelets 338.  Fecal occult blood was negative.  Lipase was 23.  CMP shows a chloride of 96 mmol/L, glucose of 162 mg/dL and total protein 8.6 g/dL.  All other values are within normal limits.  Imaging: A 2 view chest radiograph did not show any acute cardiopulmonary findings.  CT abdomen/pelvis with contrast showed mild wall thickening of sigmoid colon with adjacent free fluid in the pelvis concerning for infectious or inflammatory colitis.    Review of Systems: As per HPI otherwise 10 point review of systems negative.   Past Medical History:  Diagnosis Date  . Arthritis of  shoulder region, right, degenerative    right shoulder   . Bladder prolapse, female, acquired   . Cancer [facility]) 2007   thyroid, papillary  . Change in bowel habits 03/19/2016  . Constipation   . DLBCL (diffuse large B cell lymphoma) ([place])   . History of B-cell lymphoma 11/18/2015  . Hypothyroidism   . Neutropenic fever ([place]) 11/01/2014  . PONV (postoperative nausea and vomiting)    during hysterectomy   . S/P radiation therapy 02/05/15-02/27/15   tonsil/neck,lt subpectoral nodal bed 30.6Gy /17 fx  . Thyroid cancer ([place])   . Wears glasses     Past Surgical History:  Procedure Laterality Date  . ABDOMINAL HYSTERECTOMY  2000  . COLONOSCOPY    . COLONOSCOPY WITH PROPOFOL N/A 06/19/2016   Procedure: COLONOSCOPY WITH PROPOFOL;  Surgeon: Ronald Lobo, MD;  Location: [place];  Service: Endoscopy;  Laterality: N/A;  . CYSTOCELE REPAIR N/A 05/03/2018   Procedure: ANTERIOR REPAIR (CYSTOCELE);  Surgeon: Bjorn Loser, MD;  [facility];  Service: Urology;  Laterality: N/A;  . CYSTOSCOPY N/A 05/03/2018   Procedure: CYSTOSCOPY;  Surgeon: Bjorn Loser, MD;  [facility];  Service: Urology;  Laterality: N/A;  . ESOPHAGOGASTRODUODENOSCOPY (EGD) WITH PROPOFOL N/A 06/19/2016   Procedure: ESOPHAGOGASTRODUODENOSCOPY (EGD) WITH PROPOFOL;  Surgeon: Ronald Lobo, MD;  Location: [facility] ENDOSCOPY;  Service: Endoscopy;  Laterality: N/A;  . LAPAROSCOPIC GASTRIC BANDING WITH HIATAL HERNIA REPAIR  05/28/2009  . LYMPH NODE BIOPSY Left 10/12/2014   Procedure: excisional biopsy deep left neck lymph node;  Surgeon: Fanny Skates, MD;  Location: [place];  Service: General;  Laterality: Left;  . PORT-A-CATH  REMOVAL N/A 10/18/2015   Procedure: REMOVAL PORT-A-CATH;  Surgeon: Fanny Skates, MD;  [facility];  Service: General;  Laterality: N/A;  . PORTACATH PLACEMENT Right 10/24/2014   Procedure: INSERTION PORT-A-CATH WITH ULTRASOUND GUIDANCE;  Surgeon: Fanny Skates, MD;  Location:  [place];  Service: General;  Laterality: Right;  . 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  [name] TOTAL HIP ARTHROPLASTY Left 11/12/2017   Procedure: LEFT TOTAL HIP ARTHROPLASTY ANTERIOR APPROACH;  Surgeon: Dorna Leitz, MD;  [facility];  Service: Orthopedics;  Laterality: Left;  . TUBAL LIGATION  1990  . VAGINAL PROLAPSE REPAIR N/A 05/03/2018   Procedure: VAULT PROLAPSE REPAIR WITH GRAFT;  Surgeon: Bjorn Loser, MD;  [facility];  Service: Urology;  Laterality: N/A;     reports that she has never smoked. She has never used smokeless tobacco. She reports that she does not drink alcohol or use drugs.  Allergies  Allergen Reactions  . Metformin And Related Rash    Fingers turned  Blue.  . Tape Itching    Surgical tape.  . Levaquin [Levofloxacin] Rash  . Penicillins Rash    Has patient had a PCN reaction causing immediate rash, facial/tongue/throat swelling, SOB or lightheadedness with hypotension: No Has patient had a PCN reaction causing severe rash involving mucus membranes or skin necrosis: No Has patient had a PCN reaction that required hospitalization No Has patient had a PCN reaction occurring within the last 10 years: No If all of the above answers are "NO", then may proceed with Cephalosporin use.     Family History  Problem Relation Age of Onset  . Colon cancer Mother   . Cancer Mother        colon ca  . Cancer Father        prostate  . Hypertension Father   . Hyperlipidemia Father   . Stroke Father   . Prostate cancer Father   . Cancer Paternal Grandmother        female organ ca   Prior to Admission medications   Medication Sig Start Date End Date Taking? Authorizing Provider  acetaminophen (TYLENOL) 500 MG tablet Take 500 mg by mouth every 6 (six) hours as needed for mild pain or moderate pain.   Yes [provider]  Biotin 5000 MCG TABS Take 1 tablet by mouth daily.  Reported on 02/18/2016   Yes [provider]  cyanocobalamin 500 MCG tablet Take 500 mcg by mouth daily with lunch.    Yes [provider]  levothyroxine (SYNTHROID, LEVOTHROID) 137 MCG tablet Take 137 mcg by mouth daily before breakfast.   Yes [provider]  Multiple Vitamin (MULITIVITAMIN WITH MINERALS) TABS Take 1 tablet by mouth every morning.    Yes [provider]  Vitamin D, Cholecalciferol, 1000 UNITS TABS Take 1,000 Units by mouth daily with lunch.    Yes [provider]    Physical Exam: Vitals:   10/22/18 1930 10/22/18 1945 10/22/18 2000 10/22/18 2048  BP: (!) 121/58  (!) 109/59 134/66  Pulse: (!) 59 (!) 57 62 62  Resp: 13 13 13 14   Temp:      TempSrc:      SpO2: 100% 99% 98% 98%  Weight:      Height:        Constitutional: NAD, calm, comfortable Eyes: PERRL, lids and conjunctivae normal ENMT: Mucous membranes are moist. Posterior pharynx clear of any exudate or lesions. Neck: Normal, supple,  no masses, no thyromegaly Respiratory: Clear to auscultation bilaterally, no wheezing, no crackles. Normal respiratory effort. No accessory muscle use.  Cardiovascular: Regular rate and rhythm, no murmurs / rubs / gallops. No extremity edema. 2+ pedal pulses. No carotid bruits.  Abdomen: Soft, mild LLE tenderness, no guarding/rebound/masses palpated. No hepatosplenomegaly. Bowel sounds positive.  Musculoskeletal: no clubbing / cyanosis. No joint deformity upper and lower extremities. Good ROM, no contractures. Normal muscle tone.  Skin: no rashes, lesions, ulcers. No induration on limited dermatological examination. Neurologic: CN 2-12 grossly intact. Sensation intact, DTR normal. Strength 5/5 in all 4.  Psychiatric: Normal judgment and insight. Alert and oriented x 3. Normal mood.   Labs on Admission: I have personally reviewed following labs and imaging studies  CBC: Recent Labs  Lab 10/22/18 1724  WBC 6.6  HGB 13.6  HCT 41.8  MCV  85.0  PLT 258   Basic Metabolic Panel: Recent Labs  Lab 10/22/18 1724  NA 135  K 3.8  CL 96*  CO2 27  GLUCOSE 162*  BUN 8  CREATININE 0.81  CALCIUM 9.3   GFR: Estimated Creatinine Clearance: 80.7 mL/min (by C-G formula based on SCr of 0.81 mg/dL). Liver Function Tests: Recent Labs  Lab 10/22/18 1724  AST 29  ALT 19  ALKPHOS 94  BILITOT 0.9  PROT 8.6*  ALBUMIN 4.5   Recent Labs  Lab 10/22/18 1724  LIPASE 23   No results for input(s): AMMONIA in the last 168 hours. Coagulation Profile: No results for input(s): INR, PROTIME in the last 168 hours. Cardiac Enzymes: No results for input(s): CKTOTAL, CKMB, CKMBINDEX, TROPONINI in the last 168 hours. BNP (last 3 results) No results for input(s): PROBNP in the last 8760 hours. HbA1C: No results for input(s): HGBA1C in the last 72 hours. CBG: No results for input(s): GLUCAP in the last 168 hours. Lipid Profile: No results for input(s): CHOL, HDL, LDLCALC, TRIG, CHOLHDL, LDLDIRECT in the last 72 hours. Thyroid Function Tests: No results for input(s): TSH, T4TOTAL, FREET4, T3FREE, THYROIDAB in the last 72 hours. Anemia Panel: No results for input(s): VITAMINB12, FOLATE, FERRITIN, TIBC, IRON, RETICCTPCT in the last 72 hours. Urine analysis:    Component Value Date/Time   COLORURINE STRAW (A) 10/22/2018 1919   APPEARANCEUR CLEAR 10/22/2018 1919   LABSPEC 1.006 10/22/2018 1919   PHURINE 7.0 10/22/2018 1919   GLUCOSEU NEGATIVE 10/22/2018 1919   HGBUR SMALL (A) 10/22/2018 [place] NEGATIVE 10/22/2018 1919   BILIRUBINUR N 02/18/2016 [place] 10/22/2018 1919   PROTEINUR NEGATIVE 10/22/2018 1919   UROBILINOGEN negative 02/18/2016 1025   UROBILINOGEN 0.2 11/01/2014 1348   NITRITE NEGATIVE 10/22/2018 1919   LEUKOCYTESUR NEGATIVE 10/22/2018 1919    Radiological Exams on Admission: Dg Chest 2 View  Result Date: 10/22/2018 CLINICAL DATA:  GI bleed.  Severe abdominal pain. EXAM: CHEST - 2 VIEW  COMPARISON:  Chest x-ray 05/30/2017 FINDINGS: The cardiac silhouette, mediastinal and hilar contours are within normal limits and stable. The lungs are clear. No pleural effusion or pneumothorax. The bony thorax is intact. Stable degenerative changes in the mid to lower thoracic spine. IMPRESSION: No acute cardiopulmonary findings. Electronically Signed   By: Marijo Sanes M.D.   On: 10/22/2018 20:35   Ct Abdomen Pelvis W Contrast  Result Date: 10/22/2018 CLINICAL DATA:  Bloody stool, severe abdominal pain. EXAM: CT ABDOMEN AND PELVIS WITH CONTRAST TECHNIQUE: Multidetector CT imaging of the abdomen and pelvis was performed using the standard protocol following bolus administration of  intravenous contrast. CONTRAST:  137mL ISOVUE-300 IOPAMIDOL (ISOVUE-300) INJECTION 61% COMPARISON:  PET scan of Apr 21, 2018.  CT scan of March 26, 2016. FINDINGS: Lower chest: No acute abnormality. Hepatobiliary: No focal liver abnormality is seen. No gallstones, gallbladder wall thickening, or biliary dilatation. Pancreas: Unremarkable. No pancreatic ductal dilatation or surrounding inflammatory changes. Spleen: Normal in size without focal abnormality. Adrenals/Urinary Tract: Adrenal glands are unremarkable. Kidneys are normal, without renal calculi, focal lesion, or hydronephrosis. Bladder is unremarkable. Stomach/Bowel: Lap band device is noted in region of gastric cardia. The appendix appears normal. There is no definite evidence of bowel obstruction. Mild wall thickening of sigmoid colon is noted concerning for infectious or inflammatory colitis. Vascular/Lymphatic: No significant vascular findings are present. No enlarged abdominal or pelvic lymph nodes. Reproductive: Status post hysterectomy. No adnexal masses. Other: Small amount of free fluid is noted in the pelvis. No hernia is noted. Musculoskeletal: No acute or significant osseous findings. IMPRESSION: Mild wall thickening of sigmoid colon is noted with adjacent free  fluid in the pelvis concerning for infectious or inflammatory colitis. Electronically Signed   By: Marijo Conception, M.D.   On: 10/22/2018 20:37    EKG: Independently reviewed.    Assessment/Plan Principal Problem:   Rectal bleeding Observation/MedSurg. Keep n.p.o. Continue IV fluids. Analgesics as needed. Antiemetics as needed. Will treat empirically with metronidazole and ceftriaxone for colitis. Consult gastroenterology.  Active Problems:   History of B-cell lymphoma In remission. Continue follow-ups with heme-onc.    Hypothyroidism Continue levothyroxine. Check TSH.    Hyperglycemia Currently n.p.o. Check fasting glucose. Check hemoglobin A1c.   DVT prophylaxis: SCDs. Code Status: Full code. Family Communication: Disposition Plan: Observation/telemetry for H&H monitoring and GI evaluation. Consults called: Consult gastroenterology. Admission status: Observation/MedSurg.   [name] MD [facility] Pager [phone].  If 7PM-7AM, please contact night-coverage [email or web address] Password [facility]  10/22/2018, 9:29 PM   This document was prepared using Dragon voice recognition software and may contain some unintended transcription errors

## 2018-10-23 ENCOUNTER — Encounter (HOSPITAL_COMMUNITY): Payer: Self-pay | Admitting: Gastroenterology

## 2018-10-23 DIAGNOSIS — Z8572 Personal history of non-Hodgkin lymphomas: Secondary | ICD-10-CM | POA: Diagnosis not present

## 2018-10-23 DIAGNOSIS — K625 Hemorrhage of anus and rectum: Principal | ICD-10-CM

## 2018-10-23 DIAGNOSIS — Z96642 Presence of left artificial hip joint: Secondary | ICD-10-CM | POA: Diagnosis not present

## 2018-10-23 DIAGNOSIS — Z85850 Personal history of malignant neoplasm of thyroid: Secondary | ICD-10-CM | POA: Diagnosis not present

## 2018-10-23 DIAGNOSIS — E039 Hypothyroidism, unspecified: Secondary | ICD-10-CM | POA: Diagnosis not present

## 2018-10-23 DIAGNOSIS — Z79899 Other long term (current) drug therapy: Secondary | ICD-10-CM | POA: Diagnosis not present

## 2018-10-23 LAB — BASIC METABOLIC PANEL
ANION GAP: 8 (ref 5–15)
BUN: 7 mg/dL (ref 6–20)
CO2: 29 mmol/L (ref 22–32)
Calcium: 8.2 mg/dL — ABNORMAL LOW (ref 8.9–10.3)
Chloride: 101 mmol/L (ref 98–111)
Creatinine, Ser: 0.88 mg/dL (ref 0.44–1.00)
GFR calc Af Amer: >60 mL/min (ref >60)
GLUCOSE: 104 mg/dL — AB (ref 70–99)
Potassium: 3.2 mmol/L — ABNORMAL LOW (ref 3.5–5.1)
Sodium: 138 mmol/L (ref 135–145)

## 2018-10-23 LAB — CBC
HCT: 36.2 % (ref 36.0–46.0)
Hemoglobin: 11.1 g/dL — ABNORMAL LOW (ref 12.0–15.0)
MCH: 26.7 pg (ref 26.0–34.0)
MCHC: 30.7 g/dL (ref 30.0–36.0)
MCV: 87 fL (ref 80.0–100.0)
NRBC: 0 % (ref 0.0–0.2)
PLATELETS: 304 10*3/uL (ref 150–400)
RBC: 4.16 MIL/uL (ref 3.87–5.11)
RDW: 14.5 % (ref 11.5–15.5)
WBC: 4.8 10*3/uL (ref 4.0–10.5)

## 2018-10-23 MED ORDER — SODIUM CHLORIDE 0.9 % IV SOLN
2.0000 g | INTRAVENOUS | Status: DC
  Administered 2018-10-23 – 2018-10-24 (×2): 2 g via INTRAVENOUS
  Filled 2018-10-23: qty 20
  Filled 2018-10-23: qty 2
  Filled 2018-10-23 (×2): qty 20

## 2018-10-23 MED ORDER — ENOXAPARIN SODIUM 40 MG/0.4ML ~~LOC~~ SOLN
40.0000 mg | SUBCUTANEOUS | Status: DC
  Administered 2018-10-23: 40 mg via SUBCUTANEOUS
  Filled 2018-10-23: qty 0.4

## 2018-10-23 NOTE — Progress Notes (Signed)
PHARMACY NOTE:  ANTIMICROBIAL INDICATION DOSAGE ADJUSTMENT  Current antimicrobial regimen includes a mismatch between antimicrobial dosage and indication.  As per policy approved by the Pharmacy & Therapeutics and Medical Executive Committees, the antimicrobial dosage will be adjusted accordingly.  Current antimicrobial dosage:  Ceftriaxone 1 gram Q24  Indication: intra-abdominal infection     Antimicrobial dosage has been changed to:  Ceftriaxone 2 grams Q24  Additional comments:   Thank you for allowing pharmacy to be a part of this patient's care.  Nyra Capes, [facility] 10/23/2018 3:28 AM

## 2018-10-23 NOTE — Progress Notes (Signed)
PROGRESS NOTE    Maria Simmons  ZOX:[id number] DOB: Jun 25, 1966 DOA: 10/22/2018 PCP: Servando Salina, MD    Brief Narrative:  52 year old with past medical history relevant for papillary thyroid cancer status post thyroidectomy, gated by iatrogenic hypothyroidism, few large B-cell lymphoma in remission, family history of cancer syndrome who presents with some rectal bleeding and abdominal pain and found to have colitis on CT.   Assessment & Plan:   Principal Problem:   Rectal bleeding Active Problems:   History of B-cell lymphoma   Hypothyroidism   Hyperglycemia   #) Colitis with hematochezia: Patient noted to have sigmoid colitis on CT scan.  She did have a colonoscopy in 2017 due to her family history of early colon cancer and likely cancer syndrome which was unremarkable.  She is due for another colonoscopy in 2023. -Continue IV ceftriaxone and metronidazole - Clear liquid diet, advance as tolerated -IV fluids  #) Iatrogenic hypothyroidism: -Continue levothyroxine 137 mcg  Fluids: Gentle IV fluids Electrodes: Monitor and supplement Nutrition: Clear liquid diet  Prophylaxis: Enoxaparin  Disposition: Pending tolerating full diet  Full code    Consultants:   None  Procedures:   None  Antimicrobials:   IV ceftriaxone and metronidazole started 10/23/2018   Subjective: Patient reports she is feeling much better with less pain.  She denies any nausea, vomiting, diarrhea.  She has not had any further bloody bowel movements.  Objective: Vitals:   10/22/18 2145 10/22/18 2150 10/22/18 2214 10/23/18 0542  BP:   132/64 103/67  Pulse: 71  60 71  Resp: (!) 21   18  Temp:  98.7 F (37.1 C) 98.4 F (36.9 C) 98.3 F (36.8 C)  TempSrc:  Oral Oral Oral  SpO2: 99%  99% 99%  Weight:      Height:        Intake/Output Summary (Last 24 hours) at 10/23/2018 1021 Last data filed at 10/23/2018 0400 Gross per 24 hour  Intake 2012.53 ml  Output -  Net  2012.53 ml   Filed Weights   10/22/18 1640  Weight: 77.1 kg    Examination:  General exam: Appears calm and comfortable  Respiratory system: Clear to auscultation. Respiratory effort normal. Cardiovascular system: Regular rate and rhythm, no murmurs Gastrointestinal system: Soft, mildly tender to deep palpation but no rebound or guarding, plus bowel sounds. Central nervous system: Alert and oriented. No focal neurological deficits. Extremities: No lower extremity edema Skin: No rashes over visible skin Psychiatry: Judgement and insight appear normal. Mood & affect appropriate.     Data Reviewed: I have personally reviewed following labs and imaging studies  CBC: Recent Labs  Lab 10/22/18 1724 10/22/18 2237 10/23/18 0728  WBC 6.6  --  4.8  HGB 13.6 11.9* 11.1*  HCT 41.8  --  36.2  MCV 85.0  --  87.0  PLT 338  --  811   Basic Metabolic Panel: Recent Labs  Lab 10/22/18 1724 10/22/18 2237 10/23/18 0728  NA 135  --  138  K 3.8  --  3.2*  CL 96*  --  101  CO2 27  --  29  GLUCOSE 162*  --  104*  BUN 8  --  7  CREATININE 0.81  --  0.88  CALCIUM 9.3  --  8.2*  MG  --  1.9  --    GFR: Estimated Creatinine Clearance: 74.3 mL/min (by C-G formula based on SCr of 0.88 mg/dL). Liver Function Tests: Recent Labs  Lab 10/22/18 1724  AST 29  ALT 19  ALKPHOS 94  BILITOT 0.9  PROT 8.6*  ALBUMIN 4.5   Recent Labs  Lab 10/22/18 1724  LIPASE 23   No results for input(s): AMMONIA in the last 168 hours. Coagulation Profile: No results for input(s): INR, PROTIME in the last 168 hours. Cardiac Enzymes: No results for input(s): CKTOTAL, CKMB, CKMBINDEX, TROPONINI in the last 168 hours. BNP (last 3 results) No results for input(s): PROBNP in the last 8760 hours. HbA1C: No results for input(s): HGBA1C in the last 72 hours. CBG: No results for input(s): GLUCAP in the last 168 hours. Lipid Profile: No results for input(s): CHOL, HDL, LDLCALC, TRIG, CHOLHDL, LDLDIRECT in  the last 72 hours. Thyroid Function Tests: Recent Labs    10/22/18 2238  TSH 2.780   Anemia Panel: No results for input(s): VITAMINB12, FOLATE, FERRITIN, TIBC, IRON, RETICCTPCT in the last 72 hours. Sepsis Labs: No results for input(s): PROCALCITON, LATICACIDVEN in the last 168 hours.  No results found for this or any previous visit (from the past 240 hour(s)).       Radiology Studies: Dg Chest 2 View  Result Date: 10/22/2018 CLINICAL DATA:  GI bleed.  Severe abdominal pain. EXAM: CHEST - 2 VIEW COMPARISON:  Chest x-ray 05/30/2017 FINDINGS: The cardiac silhouette, mediastinal and hilar contours are within normal limits and stable. The lungs are clear. No pleural effusion or pneumothorax. The bony thorax is intact. Stable degenerative changes in the mid to lower thoracic spine. IMPRESSION: No acute cardiopulmonary findings. Electronically Signed   By: Marijo Sanes M.D.   On: 10/22/2018 20:35   Ct Abdomen Pelvis W Contrast  Result Date: 10/22/2018 CLINICAL DATA:  Bloody stool, severe abdominal pain. EXAM: CT ABDOMEN AND PELVIS WITH CONTRAST TECHNIQUE: Multidetector CT imaging of the abdomen and pelvis was performed using the standard protocol following bolus administration of intravenous contrast. CONTRAST:  176mL ISOVUE-300 IOPAMIDOL (ISOVUE-300) INJECTION 61% COMPARISON:  PET scan of Apr 21, 2018.  CT scan of March 26, 2016. FINDINGS: Lower chest: No acute abnormality. Hepatobiliary: No focal liver abnormality is seen. No gallstones, gallbladder wall thickening, or biliary dilatation. Pancreas: Unremarkable. No pancreatic ductal dilatation or surrounding inflammatory changes. Spleen: Normal in size without focal abnormality. Adrenals/Urinary Tract: Adrenal glands are unremarkable. Kidneys are normal, without renal calculi, focal lesion, or hydronephrosis. Bladder is unremarkable. Stomach/Bowel: Lap band device is noted in region of gastric cardia. The appendix appears normal. There is no  definite evidence of bowel obstruction. Mild wall thickening of sigmoid colon is noted concerning for infectious or inflammatory colitis. Vascular/Lymphatic: No significant vascular findings are present. No enlarged abdominal or pelvic lymph nodes. Reproductive: Status post hysterectomy. No adnexal masses. Other: Small amount of free fluid is noted in the pelvis. No hernia is noted. Musculoskeletal: No acute or significant osseous findings. IMPRESSION: Mild wall thickening of sigmoid colon is noted with adjacent free fluid in the pelvis concerning for infectious or inflammatory colitis. Electronically Signed   By: Marijo Conception, M.D.   On: 10/22/2018 20:37        Scheduled Meds: . levothyroxine  137 mcg Oral QAC breakfast   Continuous Infusions: . sodium chloride Stopped (10/22/18 2052)  . cefTRIAXone (ROCEPHIN)  IV 2 g (10/23/18 0342)  . metronidazole 500 mg (10/23/18 0141)     LOS: 0 days    Time spent: 72    Cristy Folks, MD [facility]  If 7PM-7AM, please contact night-coverage [email or web address] Password [email or web address] 10/23/2018, 10:21 AM

## 2018-10-23 NOTE — Consult Note (Addendum)
Referring Provider: No ref. provider found Primary Care Physician:  Servando Salina, MD Primary Gastroenterologist:  DR. Cristina Gong  Reason for Consultation:  RECTAL BLEEDING/DIARRHEA   Impression: ADMITTED WITH ACUTE ONSET OF DIARRHEA/RECTAL BLEEDING AND LIKLEY DUE TO INFECTIOUS COLITIS(FODD BORNE, VIRAL, BACTERIAL), LESS LIKELY COLON CANCER, OR ISCHEMIC COLITIS.  Plan: 1. CONTINUE ABX FOR 7-10 DAYS. 2. ADVANCE DIET 3. OPV WITH IN 4-6 WEEKS WITH DR. Cristina Gong. 4. COLLECT LOOSE STOOL FOR SAMPLE.      HPI:  STOMACH WAS CRAMPING ON FRI AND FELT WEAK. PAIN IN LOWER ABDOMEN SHARP/CRAMPY, NO RADIATION. STAYED CLOSE TO BATHROOM. WAS HAVING DIARRHEA/BRBPR. STARTED SAT AM BETWEEN 10-11. HAPPENED 5-6 TIMES AND THEN WAITED AND WAITED TILL DAUGHTER CAME HOME AND CAME TO ED AT 4 PM YESTERDAY. HAD NAUSEA AND HAD DRY HEAVES IN WAITING ROOM AND IN ED BATHROOM. YESTERDAY WAS "FREEZING". BEEN COLD NATURED SINCE CHEMO/XRT. HAS CONSTIPATION: BMs Q1-3 DAYS SINCE CHEMO/XRT(2015). FRI NIGHT SHE HAD HEARTBURN.  NO TRAVEL OR ABX. HAS WELL WATER BUT DRINKS BOTTLED WATER. MADE SALAD Friday: PEELED CUCUMBERS, WASHED ROMAINE LETTUCE, AND HOME GROWN TOMATOES. LAST HAD SALMON: TUE/WED. NO BEEF OR CHICKEN OR SUSHI. TODAY HAD A BM THAT WAS SOFT BLOBS.  TODAY FEELS A LITTLE BETTER SINCE ADMISSION. LAST SAW BLOOD YESTERDAY.   PT DENIES FEVER, CHILLS, HEMATEMESIS,  vomiting, melena, diarrhea, CHEST PAIN, SHORTNESS OF BREATH,  CHANGE IN BOWEL IN HABITS, problems swallowing, problems with sedation, OR heartburn or indigestion.  Past Medical History:  Diagnosis Date  . Arthritis of shoulder region, right, degenerative    right shoulder   . Bladder prolapse, female, acquired   . 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 Change in bowel habits 03/19/2016  . Constipation   . DLBCL (diffuse large B cell lymphoma) ([place])   . History of B-cell lymphoma 11/18/2015  . Hypothyroidism   . Neutropenic fever ([place]) 11/01/2014  .  PONV (postoperative nausea and vomiting)    during hysterectomy   . S/P radiation therapy 02/05/15-02/27/15   tonsil/neck,lt subpectoral nodal bed 30.6Gy /17 fx  . Thyroid cancer ([place])   . Wears glasses     Past Surgical History:  Procedure Laterality Date  . ABDOMINAL HYSTERECTOMY  2000  . COLONOSCOPY    . COLONOSCOPY WITH PROPOFOL N/A 06/19/2016   Procedure: COLONOSCOPY WITH PROPOFOL;  Surgeon: Ronald Lobo, MD;  Location: [place];  Service: Endoscopy;  Laterality: N/A;  . CYSTOCELE REPAIR N/A 05/03/2018   Procedure: ANTERIOR REPAIR (CYSTOCELE);  Surgeon: Bjorn Loser, MD;  Location: [facility];  Service: Urology;  Laterality: N/A;  . CYSTOSCOPY N/A 05/03/2018   Procedure: CYSTOSCOPY;  Surgeon: Bjorn Loser, MD;  Location: [facility];  Service: Urology;  Laterality: N/A;  . ESOPHAGOGASTRODUODENOSCOPY (EGD) WITH PROPOFOL N/A 06/19/2016   Procedure: ESOPHAGOGASTRODUODENOSCOPY (EGD) WITH PROPOFOL;  Surgeon: Ronald Lobo, MD;  Location: [facility];  Service: Endoscopy;  Laterality: N/A;  . LAPAROSCOPIC GASTRIC BANDING WITH HIATAL HERNIA REPAIR  05/28/2009  . LYMPH NODE BIOPSY Left 10/12/2014   Procedure: excisional biopsy deep left neck lymph node;  Surgeon: Fanny Skates, MD;  Location: [place];  Service: General;  Laterality: Left;  . PORT-A-CATH REMOVAL N/A 10/18/2015   Procedure: REMOVAL PORT-A-CATH;  Surgeon: Fanny Skates, MD;  Location: [facility];  Service: General;  Laterality: N/A;  . PORTACATH PLACEMENT Right 10/24/2014   Procedure: INSERTION PORT-A-CATH WITH ULTRASOUND GUIDANCE;  Surgeon: Fanny Skates, MD;  Location: [place];  Service: General;  Laterality: Right;  . 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  [name] TOTAL HIP ARTHROPLASTY Left 11/12/2017   Procedure: LEFT TOTAL HIP ARTHROPLASTY ANTERIOR APPROACH;  Surgeon: Dorna Leitz, MD;  Location: [facility];  Service: Orthopedics;   Laterality: Left;  . TUBAL LIGATION  1990  . VAGINAL PROLAPSE REPAIR N/A 05/03/2018   Procedure: VAULT PROLAPSE REPAIR WITH GRAFT;  Surgeon: Bjorn Loser, MD;  Location: [facility];  Service: Urology;  Laterality: N/A;    Prior to Admission medications   Medication Sig Start Date End Date Taking? Authorizing Provider  acetaminophen (TYLENOL) 500 MG tablet Take 500 mg by mouth every 6 (six) hours as needed for mild pain or moderate pain.   Yes [provider]  Biotin 5000 MCG TABS Take 1 tablet by mouth daily. Reported on 02/18/2016   Yes [provider]  cyanocobalamin 500 MCG tablet Take 500 mcg by mouth daily with lunch.    Yes [provider]  levothyroxine (SYNTHROID, LEVOTHROID) 137 MCG tablet Take 137 mcg by mouth daily before breakfast.   Yes [provider]  Multiple Vitamin (MULITIVITAMIN WITH MINERALS) TABS Take 1 tablet by mouth every morning.    Yes [provider]  Vitamin D, Cholecalciferol, 1000 UNITS TABS Take 1,000 Units by mouth daily with lunch.    Yes [provider]    Current Facility-Administered Medications  Medication Dose Route Frequency Provider Last Rate Last Dose  . 0.9 %  sodium chloride infusion   Intravenous Continuous [name], MD   Stopped at 10/22/18 2052  . acetaminophen (TYLENOL) tablet 650 mg  650 mg Oral Q6H PRN [name], MD       Or  . acetaminophen (TYLENOL) suppository 650 mg  650 mg Rectal Q6H PRN [name], MD      . cefTRIAXone (ROCEPHIN) 2 g in sodium chloride 0.9 % 100 mL IVPB  2 g Intravenous Q24H [name], MD 200 mL/hr at 10/23/18 0342 2 g at 10/23/18 0342  . enoxaparin (LOVENOX) injection 40 mg  40 mg Subcutaneous Q24H Purohit, Shrey C, MD      . levothyroxine (SYNTHROID, LEVOTHROID) tablet 137 mcg  137 mcg Oral QAC breakfast [name], MD   137 mcg at 10/23/18 [phone]  . metroNIDAZOLE (FLAGYL) IVPB 500 mg  500 mg Intravenous Q8H [name], MD 100 mL/hr at 10/23/18 1041 500 mg at 10/23/18 1041  . ondansetron (ZOFRAN) tablet 4 mg  4 mg Oral Q6H PRN [name], MD       Or  . ondansetron [facility]) injection 4 mg  4 mg Intravenous Q6H PRN [name], MD        Allergies as of 10/22/2018 - Review Complete 10/22/2018  Allergen Reaction Noted  . Metformin and related Rash 09/02/2012  . Tape Itching 09/02/2012  . Levaquin [levofloxacin] Rash 11/05/2014  . Penicillins Rash 03/15/2012    Family History  Problem Relation Age of Onset  . Colon cancer Mother   . Cancer Mother        colon ca  . Cancer Father        prostate  . Hypertension Father   . Hyperlipidemia Father   . Stroke Father   . Prostate cancer Father   . Cancer Paternal Grandmother        female organ ca   Social History   Socioeconomic History  . Marital status: Divorced    Spouse name: Not on file  .  Number of children: Not on file  . Years of education: Not on file  . Highest education level: Not on file  Occupational History  . Not on file  Social Needs  . Financial resource strain: Not on file  . Food insecurity:    Worry: Not on file    Inability: Not on file  . Transportation needs:    Medical: Not on file    Non-medical: Not on file  Tobacco Use  . Smoking status: Never Smoker  . Smokeless tobacco: Never Used  Substance and Sexual Activity  . Alcohol use: No  . Drug use: No  . Sexual activity: Yes    Birth control/protection: Surgical  Lifestyle  . Physical activity:    Days per week: Not on file    Minutes per session: Not on file  . Stress: Not on file  Relationships  . Social connections:    Talks on phone: Not on file    Gets together: Not on file    Attends religious service: Not on file    Active member of club or organization: Not on file    Attends meetings of clubs or organizations: Not on file    Relationship status: Not on file  Other Topics Concern  . 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 SINGLE. HAS 2 KIDS-1 GRAND SON AND A GOD GRANDSON.    Review of Systems: PER HPI OTHERWISE ALL SYSTEMS ARE NEGATIVE.   Vitals: Blood pressure 103/67, pulse 71, temperature 98.3 F (36.8 C), temperature source Oral, resp. rate 18, height 5' 4.5" (1.638 m), weight 77.1 kg, last menstrual period 04/13/2000, SpO2 99 %.  Physical Exam: General:   Alert,  Well-developed, well-nourished, pleasant and cooperative in NAD Head:  Normocephalic and atraumatic. Eyes:  Sclera clear, no icterus.   Conjunctiva pink. Mouth:  No lesions, dentition normal. Neck:  Supple; no masses. Lungs:  Clear throughout to auscultation.   No wheezes. No acute distress. Heart:  Regular rate and rhythm; no murmurs. Abdomen:  Soft, nontender and nondistended. No masses. Normal bowel sounds, without guarding, and without rebound.   Msk:  Symmetrical without gross deformities. Normal posture. Extremities:  Without edema. Neurologic:  Alert and  oriented x4;  grossly normal neurologically. Cervical Nodes:  No significant cervical adenopathy. Psych:  Alert and cooperative. Normal mood and affect.   Lab Results: Recent Labs    10/22/18 1724 10/22/18 2237 10/23/18 0728  WBC 6.6  --  4.8  HGB 13.6 11.9* 11.1*  HCT 41.8  --  36.2  PLT 338  --  304   BMET Recent Labs    10/22/18 1724 10/23/18 0728  NA 135 138  K 3.8 3.2*  CL 96* 101  CO2 27 29  GLUCOSE 162* 104*  BUN 8 7  CREATININE 0.81 0.88  CALCIUM 9.3 8.2*   LFT Recent Labs    10/22/18 1724  PROT 8.6*  ALBUMIN 4.5  AST 29  ALT 19  ALKPHOS 94  BILITOT 0.9     Studies/Results:   LOS: 0 days   Worth Kober  10/23/2018, 1:20 PM

## 2018-10-24 ENCOUNTER — Telehealth: Payer: Self-pay | Admitting: Gastroenterology

## 2018-10-24 DIAGNOSIS — Z79899 Other long term (current) drug therapy: Secondary | ICD-10-CM | POA: Diagnosis not present

## 2018-10-24 DIAGNOSIS — Z96642 Presence of left artificial hip joint: Secondary | ICD-10-CM | POA: Diagnosis not present

## 2018-10-24 DIAGNOSIS — R197 Diarrhea, unspecified: Secondary | ICD-10-CM

## 2018-10-24 DIAGNOSIS — K625 Hemorrhage of anus and rectum: Secondary | ICD-10-CM | POA: Diagnosis not present

## 2018-10-24 DIAGNOSIS — E039 Hypothyroidism, unspecified: Secondary | ICD-10-CM | POA: Diagnosis not present

## 2018-10-24 DIAGNOSIS — Z8572 Personal history of non-Hodgkin lymphomas: Secondary | ICD-10-CM | POA: Diagnosis not present

## 2018-10-24 DIAGNOSIS — Z85850 Personal history of malignant neoplasm of thyroid: Secondary | ICD-10-CM | POA: Diagnosis not present

## 2018-10-24 MED ORDER — CEFDINIR 300 MG PO CAPS: 300.0000 mg | ORAL_CAPSULE | Freq: Two times a day (BID) | ORAL | 0 refills | Status: AC

## 2018-10-24 MED ORDER — METRONIDAZOLE 500 MG PO TABS: 500.0000 mg | ORAL_TABLET | Freq: Three times a day (TID) | ORAL | 0 refills | Status: AC

## 2018-10-24 MED FILL — CEFDINIR 300 MG CAPS: 300 | 3 days supply | Qty: 6 | Fill #0

## 2018-10-24 MED FILL — metroNIDAZOLE 500 MG TABS: 500 | 3 days supply | Qty: 9 | Fill #0

## 2018-10-24 NOTE — Discharge Instructions (Signed)
Colitis Colitis is inflammation of the colon. Colitis may last a short time (acute) or it may last a long time (chronic). What are the causes? This condition may be caused by:  Viruses.  Bacteria.  Reactions to medicine.  Certain autoimmune diseases, such as Crohn disease or ulcerative colitis.  What are the signs or symptoms? Symptoms of this condition include:  Diarrhea.  Passing bloody or tarry stool.  Pain.  Fever.  Vomiting.  Tiredness (fatigue).  Weight loss.  Bloating.  Sudden increase in abdominal pain.  Having fewer bowel movements than usual.  How is this diagnosed? This condition is diagnosed with a stool test or a blood test. You may also have other tests, including X-rays, a CT scan, or a colonoscopy. How is this treated? Treatment may include:  Resting the bowel. This involves not eating or drinking for a period of time.  Fluids that are given through an IV tube.  Medicine for pain and diarrhea.  Antibiotic medicines.  Cortisone medicines.  Surgery.  Follow these instructions at home: Eating and drinking  Follow instructions from your health care provider about eating or drinking restrictions.  Drink enough fluid to keep your urine clear or pale yellow.  Work with a dietitian to determine which foods cause your condition to flare up.  Avoid foods that cause flare-ups.  Eat a well-balanced diet. Medicines  Take over-the-counter and prescription medicines only as told by your health care provider.  If you were prescribed an antibiotic medicine, take it as told by your health care provider. Do not stop taking the antibiotic even if you start to feel better. General instructions  Keep all follow-up visits as told by your health care provider. This is important. Contact a health care provider if:  Your symptoms do not go away.  You develop new symptoms. Get help right away if:  You have a fever that does not go away with  treatment.  You develop chills.  You have extreme weakness, fainting, or dehydration.  You have repeated vomiting.  You develop severe pain in your abdomen.  You pass bloody or tarry stool. This information is not intended to replace advice given to you by your health care provider. Make sure you discuss any questions you have with your health care provider. Document Released: 01/07/2005 Document Revised: 05/07/2016 Document Reviewed: 03/25/2015 Elsevier Interactive Patient Education  2018 Elsevier Inc.  

## 2018-10-24 NOTE — Progress Notes (Signed)
Subjective: No rectal bleeding, no abdominal pain. Soft stool. Eager to go home. Tolerating diet. Sitting up in chair. Mild nausea.   Objective: Vital signs in last 24 hours: Temp:  [97.8 F (36.6 C)-98.7 F (37.1 C)] 97.8 F (36.6 C) (11/11 0706) Pulse Rate:  [61-72] 61 (11/11 0706) Resp:  [16-18] 18 (11/11 0706) BP: (103-128)/(65-70) 128/70 (11/11 0706) SpO2:  [97 %-100 %] 100 % (11/11 0706)   General:   Alert and oriented, pleasant Head:  Normocephalic and atraumatic. Abdomen:  Bowel sounds present, soft, non-tender, non-distended. No HSM or hernias noted. No rebound or guarding. No masses appreciated  Extremities:  Without edema. Neurologic:  Alert and  oriented x4 Psych: Normal mood and affect.  Intake/Output from previous day: 11/10 0701 - 11/11 0700 In: 840 [P.O.:840] Out: -  Intake/Output this shift: No intake/output data recorded.  Lab Results: Recent Labs    10/22/18 1724 10/22/18 2237 10/23/18 0728  WBC 6.6  --  4.8  HGB 13.6 11.9* 11.1*  HCT 41.8  --  36.2  PLT 338  --  304   BMET Recent Labs    10/22/18 1724 10/23/18 0728  NA 135 138  K 3.8 3.2*  CL 96* 101  CO2 27 29  GLUCOSE 162* 104*  BUN 8 7  CREATININE 0.81 0.88  CALCIUM 9.3 8.2*   LFT Recent Labs    10/22/18 1724  PROT 8.6*  ALBUMIN 4.5  AST 29  ALT 19  ALKPHOS 94  BILITOT 0.9    Studies/Results: Dg Chest 2 View  Result Date: 10/22/2018 CLINICAL DATA:  GI bleed.  Severe abdominal pain. EXAM: CHEST - 2 VIEW COMPARISON:  Chest x-ray 05/30/2017 FINDINGS: The cardiac silhouette, mediastinal and hilar contours are within normal limits and stable. The lungs are clear. No pleural effusion or pneumothorax. The bony thorax is intact. Stable degenerative changes in the mid to lower thoracic spine. IMPRESSION: No acute cardiopulmonary findings. Electronically Signed   By: Marijo Sanes M.D.   On: 10/22/2018 20:35   Ct Abdomen Pelvis W Contrast  Result Date: 10/22/2018 CLINICAL  DATA:  Bloody stool, severe abdominal pain. EXAM: CT ABDOMEN AND PELVIS WITH CONTRAST TECHNIQUE: Multidetector CT imaging of the abdomen and pelvis was performed using the standard protocol following bolus administration of intravenous contrast. CONTRAST:  178mL ISOVUE-300 IOPAMIDOL (ISOVUE-300) INJECTION 61% COMPARISON:  PET scan of Apr 21, 2018.  CT scan of March 26, 2016. FINDINGS: Lower chest: No acute abnormality. Hepatobiliary: No focal liver abnormality is seen. No gallstones, gallbladder wall thickening, or biliary dilatation. Pancreas: Unremarkable. No pancreatic ductal dilatation or surrounding inflammatory changes. Spleen: Normal in size without focal abnormality. Adrenals/Urinary Tract: Adrenal glands are unremarkable. Kidneys are normal, without renal calculi, focal lesion, or hydronephrosis. Bladder is unremarkable. Stomach/Bowel: Lap band device is noted in region of gastric cardia. The appendix appears normal. There is no definite evidence of bowel obstruction. Mild wall thickening of sigmoid colon is noted concerning for infectious or inflammatory colitis. Vascular/Lymphatic: No significant vascular findings are present. No enlarged abdominal or pelvic lymph nodes. Reproductive: Status post hysterectomy. No adnexal masses. Other: Small amount of free fluid is noted in the pelvis. No hernia is noted. Musculoskeletal: No acute or significant osseous findings. IMPRESSION: Mild wall thickening of sigmoid colon is noted with adjacent free fluid in the pelvis concerning for infectious or inflammatory colitis. Electronically Signed   By: Marijo Conception, M.D.   On: 10/22/2018 20:37    Assessment: 52 year old female admitted  with diarrhea and rectal bleeding, with CT showing mild wall thickening of sigmoid colon. Differentials including infectious colitis, ischemic colitis. Last colonoscopy by Dr. Cristina Gong in 2017 normal.    Plan: Continue soft diet Mild nausea: may be related to flagyl. Monitor for  worsening Outpatient follow-up with Dr. Cristina Gong Anticipate discharge today Will follow peripherally  Maria Needs, PhD, ANP-BC [facility]     LOS: 0 days    10/24/2018, 8:30 AM

## 2018-10-24 NOTE — Progress Notes (Signed)
Patient discharged home.  IV removed - WNL.  Reviewed AVS and medications - verbalizes understanding.  Instructed to follow up with PCP in 1 week.  No questions at this time, patient in NAD waiting on son to pick her up

## 2018-10-24 NOTE — Discharge Summary (Signed)
Physician Discharge Summary  Maria Simmons VWU:[id number] DOB: 1966-10-24 DOA: 10/22/2018  PCP: Servando Salina, MD  Admit date: 10/22/2018 Discharge date: 10/24/2018  Admitted From: Home Disposition:  Home  Recommendations for Outpatient Follow-up:  1. Follow up with PCP in 1-2 weeks 2. Please obtain BMP/CBC in one week 3. Please follow up on the following pending results:  Home Health:No Equipment/Devices:No  Discharge Condition: stable CODE STATUS: FULL Diet recommendation: Regular   Brief/Interim Summary:  #) Sigmoid colitis with hematochezia: Patient was admitted with abdominal pain and hematochezia.  She was noted to have possible sigmoid colitis on CT scan.  She was started on IV ceftriaxone and metronidazole.  She apparently has allergies to both penicillins and fluoroquinolones.  She was initially on a clear liquid diet and this was advanced to a regular diet which he tolerated well.  She was initially on IV fluids however these were discontinued and she was tolerating p.o.  Of note she has a nonspecified familial cancer syndrome and does get frequent colonoscopies.  Her last colonoscopy was in 2017 with Dr. Cristina Gong which was reportedly unremarkable.  #) Iatrogenic hypothyroidism: Patient was continued on home levo thyroxine.  Discharge Diagnoses:  Principal Problem:   Rectal bleeding Active Problems:   History of B-cell lymphoma   Hypothyroidism   Hyperglycemia    Discharge Instructions  Discharge Instructions    Call MD for:  difficulty breathing, headache or visual disturbances   Complete by:  As directed    Call MD for:  hives   Complete by:  As directed    Call MD for:  persistant nausea and vomiting   Complete by:  As directed    Call MD for:  redness, tenderness, or signs of infection (pain, swelling, redness, odor or green/yellow discharge around incision site)   Complete by:  As directed    Call MD for:  severe uncontrolled pain   Complete by:   As directed    Call MD for:  temperature >100.4   Complete by:  As directed    Diet - low sodium heart healthy   Complete by:  As directed    Discharge instructions   Complete by:  As directed    Please follow-up with your primary care doctor in 1 week.  Please take your antibiotics as prescribed.   Increase activity slowly   Complete by:  As directed      Allergies as of 10/24/2018      Reactions   Metformin And Related Rash   Fingers turned  Blue.   Tape Itching   Surgical tape.   Levaquin [levofloxacin] Rash   Penicillins Rash   Has patient had a PCN reaction causing immediate rash, facial/tongue/throat swelling, SOB or lightheadedness with hypotension: No Has patient had a PCN reaction causing severe rash involving mucus membranes or skin necrosis: No Has patient had a PCN reaction that required hospitalization No Has patient had a PCN reaction occurring within the last 10 years: No If all of the above answers are "NO", then may proceed with Cephalosporin use.      Medication List    TAKE these medications   acetaminophen 500 MG tablet Commonly known as:  TYLENOL Take 500 mg by mouth every 6 (six) hours as needed for mild pain or moderate pain.   Biotin 5000 MCG Tabs Take 1 tablet by mouth daily. Reported on 02/18/2016   cefdinir 300 MG capsule Commonly known as:  OMNICEF Take 1 capsule (300 mg total) by  mouth 2 (two) times daily for 3 days.   levothyroxine 137 MCG tablet Commonly known as:  SYNTHROID, LEVOTHROID Take 137 mcg by mouth daily before breakfast.   metroNIDAZOLE 500 MG tablet Commonly known as:  FLAGYL Take 1 tablet (500 mg total) by mouth 3 (three) times daily for 3 days.   multivitamin with minerals Tabs tablet Take 1 tablet by mouth every morning.   vitamin B-12 500 MCG tablet Commonly known as:  CYANOCOBALAMIN Take 500 mcg by mouth daily with lunch.   Vitamin D (Cholecalciferol) 25 MCG (1000 UT) Tabs Take 1,000 Units by mouth daily with  lunch.       Allergies  Allergen Reactions  . Metformin And Related Rash    Fingers turned  Blue.  . Tape Itching    Surgical tape.  . Levaquin [Levofloxacin] Rash  . Penicillins Rash    Has patient had a PCN reaction causing immediate rash, facial/tongue/throat swelling, SOB or lightheadedness with hypotension: No Has patient had a PCN reaction causing severe rash involving mucus membranes or skin necrosis: No Has patient had a PCN reaction that required hospitalization No Has patient had a PCN reaction occurring within the last 10 years: No If all of the above answers are "NO", then may proceed with Cephalosporin use.     Consultations:  GI   Procedures/Studies: Dg Chest 2 View  Result Date: 10/22/2018 CLINICAL DATA:  GI bleed.  Severe abdominal pain. EXAM: CHEST - 2 VIEW COMPARISON:  Chest x-ray 05/30/2017 FINDINGS: The cardiac silhouette, mediastinal and hilar contours are within normal limits and stable. The lungs are clear. No pleural effusion or pneumothorax. The bony thorax is intact. Stable degenerative changes in the mid to lower thoracic spine. IMPRESSION: No acute cardiopulmonary findings. Electronically Signed   By: Marijo Sanes M.D.   On: 10/22/2018 20:35   Ct Abdomen Pelvis W Contrast  Result Date: 10/22/2018 CLINICAL DATA:  Bloody stool, severe abdominal pain. EXAM: CT ABDOMEN AND PELVIS WITH CONTRAST TECHNIQUE: Multidetector CT imaging of the abdomen and pelvis was performed using the standard protocol following bolus administration of intravenous contrast. CONTRAST:  145mL ISOVUE-300 IOPAMIDOL (ISOVUE-300) INJECTION 61% COMPARISON:  PET scan of Apr 21, 2018.  CT scan of March 26, 2016. FINDINGS: Lower chest: No acute abnormality. Hepatobiliary: No focal liver abnormality is seen. No gallstones, gallbladder wall thickening, or biliary dilatation. Pancreas: Unremarkable. No pancreatic ductal dilatation or surrounding inflammatory changes. Spleen: Normal in size  without focal abnormality. Adrenals/Urinary Tract: Adrenal glands are unremarkable. Kidneys are normal, without renal calculi, focal lesion, or hydronephrosis. Bladder is unremarkable. Stomach/Bowel: Lap band device is noted in region of gastric cardia. The appendix appears normal. There is no definite evidence of bowel obstruction. Mild wall thickening of sigmoid colon is noted concerning for infectious or inflammatory colitis. Vascular/Lymphatic: No significant vascular findings are present. No enlarged abdominal or pelvic lymph nodes. Reproductive: Status post hysterectomy. No adnexal masses. Other: Small amount of free fluid is noted in the pelvis. No hernia is noted. Musculoskeletal: No acute or significant osseous findings. IMPRESSION: Mild wall thickening of sigmoid colon is noted with adjacent free fluid in the pelvis concerning for infectious or inflammatory colitis. Electronically Signed   By: Marijo Conception, M.D.   On: 10/22/2018 20:37     Subjective:   Discharge Exam: Vitals:   10/23/18 2223 10/24/18 0706  BP: 103/65 128/70  Pulse: 72 61  Resp: 16 18  Temp: 98.7 F (37.1 C) 97.8 F (36.6 C)  SpO2:  97% 100%   Vitals:   10/22/18 2214 10/23/18 0542 10/23/18 2223 10/24/18 0706  BP: 132/64 103/67 103/65 128/70  Pulse: 60 71 72 61  Resp:  18 16 18   Temp: 98.4 F (36.9 C) 98.3 F (36.8 C) 98.7 F (37.1 C) 97.8 F (36.6 C)  TempSrc: Oral Oral Oral Oral  SpO2: 99% 99% 97% 100%  Weight:      Height:       General exam: Appears calm and comfortable  Respiratory system: Clear to auscultation. Respiratory effort normal. Cardiovascular system: Regular rate and rhythm, no murmurs Gastrointestinal system: Soft, mildly tender to deep palpation but no rebound or guarding, plus bowel sounds. Central nervous system: Alert and oriented. No focal neurological deficits. Extremities: No lower extremity edema Skin: No rashes over visible skin Psychiatry: Judgement and insight appear  normal. Mood & affect appropriate.    The results of significant diagnostics from this hospitalization (including imaging, microbiology, ancillary and laboratory) are listed below for reference.     Microbiology: No results found for this or any previous visit (from the past 240 hour(s)).   Labs: BNP (last 3 results) No results for input(s): BNP in the last 8760 hours. Basic Metabolic Panel: Recent Labs  Lab 10/22/18 1724 10/22/18 2237 10/23/18 0728  NA 135  --  138  K 3.8  --  3.2*  CL 96*  --  101  CO2 27  --  29  GLUCOSE 162*  --  104*  BUN 8  --  7  CREATININE 0.81  --  0.88  CALCIUM 9.3  --  8.2*  MG  --  1.9  --    Liver Function Tests: Recent Labs  Lab 10/22/18 1724  AST 29  ALT 19  ALKPHOS 94  BILITOT 0.9  PROT 8.6*  ALBUMIN 4.5   Recent Labs  Lab 10/22/18 1724  LIPASE 23   No results for input(s): AMMONIA in the last 168 hours. CBC: Recent Labs  Lab 10/22/18 1724 10/22/18 2237 10/23/18 0728  WBC 6.6  --  4.8  HGB 13.6 11.9* 11.1*  HCT 41.8  --  36.2  MCV 85.0  --  87.0  PLT 338  --  304   Cardiac Enzymes: No results for input(s): CKTOTAL, CKMB, CKMBINDEX, TROPONINI in the last 168 hours. BNP: Invalid input(s): POCBNP CBG: No results for input(s): GLUCAP in the last 168 hours. D-Dimer No results for input(s): DDIMER in the last 72 hours. Hgb A1c No results for input(s): HGBA1C in the last 72 hours. Lipid Profile No results for input(s): CHOL, HDL, LDLCALC, TRIG, CHOLHDL, LDLDIRECT in the last 72 hours. Thyroid function studies Recent Labs    10/22/18 2238  TSH 2.780   Anemia work up No results for input(s): VITAMINB12, FOLATE, FERRITIN, TIBC, IRON, RETICCTPCT in the last 72 hours. Urinalysis    Component Value Date/Time   COLORURINE STRAW (A) 10/22/2018 1919   APPEARANCEUR CLEAR 10/22/2018 1919   LABSPEC 1.006 10/22/2018 1919   PHURINE 7.0 10/22/2018 1919   GLUCOSEU NEGATIVE 10/22/2018 1919   HGBUR SMALL (A) 10/22/2018 [place] NEGATIVE 10/22/2018 1919   BILIRUBINUR N 02/18/2016 1025   Ray 10/22/2018 [place] NEGATIVE 10/22/2018 1919   UROBILINOGEN negative 02/18/2016 1025   UROBILINOGEN 0.2 11/01/2014 1348   NITRITE NEGATIVE 10/22/2018 1919   LEUKOCYTESUR NEGATIVE 10/22/2018 1919   Sepsis Labs Invalid input(s): PROCALCITONIN,  WBC,  LACTICIDVEN Microbiology No results found for this or any previous visit (  from the past 240 hour(s)).   Time coordinating discharge: 35  SIGNED:   Cristy Folks, MD  [facility] 10/24/2018, 10:26 AM  If 7PM-7AM, please contact night-coverage [email or web address] Password [email or web address]

## 2018-10-24 NOTE — Telephone Encounter (Signed)
Please have patient see Dr. Cristina Gong as outpatient in 4-6 weeks. Was hospitalized and discharged today.

## 2018-10-24 NOTE — Telephone Encounter (Signed)
Referral faxed to Dr. Osborn Coho office [facility]) and sent via [employer].

## 2018-11-22 NOTE — Telephone Encounter (Signed)
Called [facility] to f/u on referral with Dr. Cristina Gong. [facility] LMOVM 10/27/18, 11/09/18 and mailed letter 11/16/18. Our office mailed letter to pt today.

## 2018-11-24 ENCOUNTER — Other Ambulatory Visit: Payer: Self-pay | Admitting: Hematology and Oncology

## 2018-11-24 DIAGNOSIS — Z8579 Personal history of other malignant neoplasms of lymphoid, hematopoietic and related tissues: Secondary | ICD-10-CM

## 2018-11-25 ENCOUNTER — Telehealth: Payer: Self-pay | Admitting: Hematology and Oncology

## 2018-11-25 ENCOUNTER — Encounter: Payer: Self-pay | Admitting: Hematology and Oncology

## 2018-11-25 ENCOUNTER — Inpatient Hospital Stay: Payer: 59 | Attending: Hematology and Oncology

## 2018-11-25 ENCOUNTER — Inpatient Hospital Stay (HOSPITAL_BASED_OUTPATIENT_CLINIC_OR_DEPARTMENT_OTHER): Payer: 59 | Admitting: Hematology and Oncology

## 2018-11-25 DIAGNOSIS — Z79899 Other long term (current) drug therapy: Secondary | ICD-10-CM

## 2018-11-25 DIAGNOSIS — Z923 Personal history of irradiation: Secondary | ICD-10-CM | POA: Insufficient documentation

## 2018-11-25 DIAGNOSIS — K5909 Other constipation: Secondary | ICD-10-CM | POA: Insufficient documentation

## 2018-11-25 DIAGNOSIS — Z800 Family history of malignant neoplasm of digestive organs: Secondary | ICD-10-CM | POA: Insufficient documentation

## 2018-11-25 DIAGNOSIS — Z8579 Personal history of other malignant neoplasms of lymphoid, hematopoietic and related tissues: Secondary | ICD-10-CM

## 2018-11-25 DIAGNOSIS — Z9221 Personal history of antineoplastic chemotherapy: Secondary | ICD-10-CM | POA: Insufficient documentation

## 2018-11-25 DIAGNOSIS — Z85850 Personal history of malignant neoplasm of thyroid: Secondary | ICD-10-CM | POA: Insufficient documentation

## 2018-11-25 DIAGNOSIS — Z9884 Bariatric surgery status: Secondary | ICD-10-CM

## 2018-11-25 DIAGNOSIS — K5900 Constipation, unspecified: Secondary | ICD-10-CM | POA: Diagnosis not present

## 2018-11-25 DIAGNOSIS — E669 Obesity, unspecified: Secondary | ICD-10-CM | POA: Diagnosis not present

## 2018-11-25 DIAGNOSIS — Z8572 Personal history of non-Hodgkin lymphomas: Secondary | ICD-10-CM | POA: Insufficient documentation

## 2018-11-25 DIAGNOSIS — R635 Abnormal weight gain: Secondary | ICD-10-CM

## 2018-11-25 DIAGNOSIS — D72819 Decreased white blood cell count, unspecified: Secondary | ICD-10-CM

## 2018-11-25 LAB — COMPREHENSIVE METABOLIC PANEL
ALK PHOS: 68 U/L (ref 38–126)
ALT: 8 U/L (ref 0–44)
AST: 15 U/L (ref 15–41)
Albumin: 3.7 g/dL (ref 3.5–5.0)
Anion gap: 11 (ref 5–15)
BILIRUBIN TOTAL: 0.6 mg/dL (ref 0.3–1.2)
BUN: 11 mg/dL (ref 6–20)
CALCIUM: 9 mg/dL (ref 8.9–10.3)
CO2: 27 mmol/L (ref 22–32)
Chloride: 103 mmol/L (ref 98–111)
Creatinine, Ser: 1.04 mg/dL — ABNORMAL HIGH (ref 0.44–1.00)
GFR calc Af Amer: >60 mL/min (ref >60)
Glucose, Bld: 107 mg/dL — ABNORMAL HIGH (ref 70–99)
Potassium: 3.7 mmol/L (ref 3.5–5.1)
Sodium: 141 mmol/L (ref 135–145)
TOTAL PROTEIN: 7.1 g/dL (ref 6.5–8.1)

## 2018-11-25 LAB — CBC WITH DIFFERENTIAL/PLATELET
Abs Immature Granulocytes: 0 10*3/uL (ref 0.00–0.07)
Basophils Absolute: 0 10*3/uL (ref 0.0–0.1)
Basophils Relative: 0 %
EOS ABS: 0 10*3/uL (ref 0.0–0.5)
Eosinophils Relative: 1 %
HEMATOCRIT: 37.6 % (ref 36.0–46.0)
Hemoglobin: 12.1 g/dL (ref 12.0–15.0)
IMMATURE GRANULOCYTES: 0 %
LYMPHS ABS: 1.1 10*3/uL (ref 0.7–4.0)
Lymphocytes Relative: 35 %
MCH: 27.6 pg (ref 26.0–34.0)
MCHC: 32.2 g/dL (ref 30.0–36.0)
MCV: 85.8 fL (ref 80.0–100.0)
MONOS PCT: 11 %
Monocytes Absolute: 0.4 10*3/uL (ref 0.1–1.0)
NEUTROS PCT: 53 %
Neutro Abs: 1.7 10*3/uL (ref 1.7–7.7)
Platelets: 272 10*3/uL (ref 150–400)
RBC: 4.38 MIL/uL (ref 3.87–5.11)
RDW: 13.7 % (ref 11.5–15.5)
WBC: 3.2 10*3/uL — ABNORMAL LOW (ref 4.0–10.5)
nRBC: 0 % (ref 0.0–0.2)

## 2018-11-25 NOTE — Assessment & Plan Note (Signed)
She has chronic leukopenia, stable Her last serum vitamin B12 a year ago was within normal limits Due to her gastric surgery, I recommend high-dose vitamin B12 replacement therapy

## 2018-11-25 NOTE — Assessment & Plan Note (Signed)
She had history of gastric banding for obesity She is gaining weight We discussed dietary changes and weight loss strategies.

## 2018-11-25 NOTE — Assessment & Plan Note (Signed)
She will continue close follow-up with her endocrinologist 

## 2018-11-25 NOTE — Telephone Encounter (Signed)
Gave avs and calendar ° °

## 2018-11-25 NOTE — Progress Notes (Signed)
[place] OFFICE PROGRESS NOTE  Patient Care Team: Servando Salina, MD as PCP - General (Obstetrics and Gynecology) Servando Salina, MD as PCP - OBGYN (Obstetrics and Gynecology) Heath Lark, MD as Consulting Physician (Hematology and Oncology) Leota Sauers, RN as Oncology Nurse Navigator Eppie Gibson, MD as Attending Physician (Radiation Oncology) Frederik Pear, MD as Consulting Physician (Orthopedic Surgery) Fanny Skates, MD as Consulting Physician (General Surgery)  ASSESSMENT & PLAN:  History of lymphoma Clinically, she has no signs or symptoms of cancer recurrence I do not recommend routine surveillance imaging study The patient is educated to watch out for signs and symptoms of cancer recurrence I plan to see her back next year for further follow-up  Other constipation She has strong family history of colon cancer Her last colonoscopy was unremarkable I reviewed her most recent CT imaging that did not see any abnormalities I recommend laxative therapy and appointment to see gastroenterologist for further management  Hx of papillary thyroid carcinoma She will continue close follow-up with her endocrinologist  Chronic leukopenia She has chronic leukopenia, stable Her last serum vitamin B12 a year ago was within normal limits Due to her gastric surgery, I recommend high-dose vitamin B12 replacement therapy  H/O laparoscopic adjustable gastric banding She had history of gastric banding for obesity She is gaining weight We discussed dietary changes and weight loss strategies.   No orders of the defined types were placed in this encounter.   INTERVAL HISTORY: Please see below for problem oriented charting. She returns for lymphoma follow-up Since last time I saw her, she has not been feeling well She was hospitalized briefly for reticulitis and was prescribed antibiotic treatment Since then, she is profoundly constipated She denies recent  infection, fever or chills No new lymphadenopathy Despite her gastric banding, she is gaining weight with poor energy levels She follows closely with endocrinologist for history of thyroid cancer  SUMMARY OF ONCOLOGIC HISTORY: Oncology History   DLBCL (diffuse large B cell lymphoma)   Staging form: Lymphoid Neoplasms, AJCC 6th Edition     Clinical stage from 11/15/2014: Stage II - Signed by Heath Lark, MD on 11/15/2014       History of lymphoma   09/14/2014 Imaging    PET/CT scan showed Hypermetabolic bilateral cervical lymphadenopathy, left side greater than right, and sub-cm hypermetabolic left subpectoral lymph node. Hypermetabolic activity throughout the lingular and palatine tonsils in Waldeyer's ring    10/12/2014 Procedure    Biopsy of the left neck lymph node came back positive for diffuse large B-cell lymphoma.Accession: [id number]    10/24/2014 Procedure    PAC placed.    10/24/2014 Bone Marrow Biopsy    Bone marrow biopsy was negative.Cytogenetics were normal    10/24/2014 Imaging    Echocardiogram showed normal ejection fraction    10/25/2014 - 12/06/2014 Chemotherapy    She received 3 cyles of R CHOP chemotherapy. Further doses of R CHOP chemotherapy was reduced due to recent neutropenic fever with cycle 1 of treatment.    11/01/2014 Adverse Reaction    She was seen in the emergency Department for possible neutropenic fever. Cultures were negative. She received antibiotic therapy.    11/15/2014 - 12/06/2014 Chemotherapy    Intrathecal chemotherapy is added due to risk of CNS relapse    01/04/2015 Imaging    PET CT scan show complete remission from lymphadenopathy. Incidental finding of right shoulder arthropathy    01/16/2015 Imaging    MRI of the right shoulder show no evidence  of cancer, with evidence of tendon tear and possible changes related to trauma    02/05/2015 - 02/27/2015 Radiation Therapy    IMRT to tonsils, neck and L subpectoral nodal bed:  30.6 Gy in  17 fractions.    04/25/2015 Imaging    CT neck, chest, abdomen/pelvis:  No findings for active lymphomatous involvement.    07/16/2015 Imaging    PET/ CT scans show no evidence of lymphoma. It showed persistent abnormality in the right humeral area    10/18/2015 Procedure    PAC removed.    03/26/2016 Imaging    CT scan of chest, abdomen and pelvis showed no evidence of lymphoma. Noted changes suspicious of infection    03/26/2016 Imaging    CT neck showed asymmetry of the lower palatine tonsils with slight fullness on the left and underfilling of air column at this level.    06/19/2016 Procedure    EGD and colonoscopy showed nonerosive esophagitis    01/11/2017 Imaging    No evidence of recurrent lymphoma or other acute findings within the thorax.    10/06/2017 PET scan    1. No hypermetabolic lymphadenopathy or other definite findings of recurrent lymphoma. 2. New mild patchy hypermetabolism in the left hip joint with associated asymmetric osteoarthritis on the CT images, most compatible with degenerative uptake. No discrete joint erosions. 3. New symmetric uniform hypermetabolism in the bilateral parotid glands, without discrete parotid mass on the CT images, favoring inflammatory/physiologic uptake. 4. Symmetric mild hypermetabolism in the palatine tonsils and base of tongue bilaterally, without discrete mass correlate on the CT images, favoring inflammatory/physiologic uptake.    04/21/2018 PET scan    1. No evidence of lymphoma recurrence. 2. Stable hypermetabolic activity in the lingual tonsils and parotid glands is favored physiologic.     REVIEW OF SYSTEMS:   Constitutional: Denies fevers, chills or abnormal weight loss Eyes: Denies blurriness of vision Ears, nose, mouth, throat, and face: Denies mucositis or sore throat Respiratory: Denies cough, dyspnea or wheezes Cardiovascular: Denies palpitation, chest discomfort or lower extremity swelling Skin: Denies abnormal skin  rashes Lymphatics: Denies new lymphadenopathy or easy bruising Neurological:Denies numbness, tingling or new weaknesses Behavioral/Psych: Mood is stable, no new changes  All other systems were reviewed with the patient and are negative.  I have reviewed the past medical history, past surgical history, social history and family history with the patient and they are unchanged from previous note.  ALLERGIES:  is allergic to metformin and related; tape; levaquin [levofloxacin]; and penicillins.  MEDICATIONS:  Current Outpatient Medications  Medication Sig Dispense Refill  . acetaminophen (TYLENOL) 500 MG tablet Take 500 mg by mouth every 6 (six) hours as needed for mild pain or moderate pain.    . Biotin 5000 MCG TABS Take 1 tablet by mouth daily. Reported on 02/18/2016    . cyanocobalamin 500 MCG tablet Take 500 mcg by mouth daily with lunch.     . levothyroxine (SYNTHROID, LEVOTHROID) 137 MCG tablet Take 137 mcg by mouth daily before breakfast.    . Multiple Vitamin (MULITIVITAMIN WITH MINERALS) TABS Take 1 tablet by mouth every morning.     . Vitamin D, Cholecalciferol, 1000 UNITS TABS Take 1,000 Units by mouth daily with lunch.      No current facility-administered medications for this visit.     PHYSICAL EXAMINATION: ECOG PERFORMANCE STATUS: 1 - Symptomatic but completely ambulatory  Vitals:   11/25/18 0830  BP: (!) 147/84  Pulse: 79  Resp: 18  Temp: 97.7  F (36.5 C)  SpO2: 100%   Filed Weights   11/25/18 0830  Weight: 172 lb (78 kg)    GENERAL:alert, no distress and comfortable SKIN: skin color, texture, turgor are normal, no rashes or significant lesions EYES: normal, Conjunctiva are pink and non-injected, sclera clear OROPHARYNX:no exudate, no erythema and lips, buccal mucosa, and tongue normal  NECK: Well-healed thyroidectomy scar LYMPH:  no palpable lymphadenopathy in the cervical, axillary or inguinal LUNGS: clear to auscultation and percussion with normal breathing  effort HEART: regular rate & rhythm and no murmurs and no lower extremity edema ABDOMEN:abdomen soft, non-tender and normal bowel sounds Musculoskeletal:no cyanosis of digits and no clubbing  NEURO: alert & oriented x 3 with fluent speech, no focal motor/sensory deficits  LABORATORY DATA:  I have reviewed the data as listed    Component Value Date/Time   NA 141 11/25/2018 0815   NA 142 09/24/2017 1438   K 3.7 11/25/2018 0815   K 3.6 09/24/2017 1438   CL 103 11/25/2018 0815   CL 102 08/31/2012 1336   CO2 27 11/25/2018 0815   CO2 28 09/24/2017 1438   GLUCOSE 107 (H) 11/25/2018 0815   GLUCOSE 92 09/24/2017 1438   GLUCOSE 95 08/31/2012 1336   BUN 11 11/25/2018 0815   BUN 12.6 09/24/2017 1438   CREATININE 1.04 (H) 11/25/2018 0815   CREATININE 0.9 09/24/2017 1438   CALCIUM 9.0 11/25/2018 0815   CALCIUM 9.0 09/24/2017 1438   PROT 7.1 11/25/2018 0815   PROT 6.7 09/24/2017 1438   ALBUMIN 3.7 11/25/2018 0815   ALBUMIN 3.5 09/24/2017 1438   AST 15 11/25/2018 0815   AST 15 09/24/2017 1438   ALT 8 11/25/2018 0815   ALT 8 09/24/2017 1438   ALKPHOS 68 11/25/2018 0815   ALKPHOS 73 09/24/2017 1438   BILITOT 0.6 11/25/2018 0815   BILITOT 0.35 09/24/2017 1438   GFRNONAA >60 11/25/2018 0815   GFRAA >60 11/25/2018 0815    No results found for: SPEP, UPEP  Lab Results  Component Value Date   WBC 3.2 (L) 11/25/2018   NEUTROABS 1.7 11/25/2018   HGB 12.1 11/25/2018   HCT 37.6 11/25/2018   MCV 85.8 11/25/2018   PLT 272 11/25/2018      Chemistry      Component Value Date/Time   NA 141 11/25/2018 0815   NA 142 09/24/2017 1438   K 3.7 11/25/2018 0815   K 3.6 09/24/2017 1438   CL 103 11/25/2018 0815   CL 102 08/31/2012 1336   CO2 27 11/25/2018 0815   CO2 28 09/24/2017 1438   BUN 11 11/25/2018 0815   BUN 12.6 09/24/2017 1438   CREATININE 1.04 (H) 11/25/2018 0815   CREATININE 0.9 09/24/2017 1438      Component Value Date/Time   CALCIUM 9.0 11/25/2018 0815   CALCIUM 9.0  09/24/2017 1438   ALKPHOS 68 11/25/2018 0815   ALKPHOS 73 09/24/2017 1438   AST 15 11/25/2018 0815   AST 15 09/24/2017 1438   ALT 8 11/25/2018 0815   ALT 8 09/24/2017 1438   BILITOT 0.6 11/25/2018 0815   BILITOT 0.35 09/24/2017 1438       All questions were answered. The patient knows to call the clinic with any problems, questions or concerns. No barriers to learning was detected.  I spent 15 minutes counseling the patient face to face. The total time spent in the appointment was 20 minutes and more than 50% was on counseling and review of test results  Elianny Buxbaum  Alvy Bimler, MD 11/25/2018 1:33 PM

## 2018-11-25 NOTE — Assessment & Plan Note (Signed)
Clinically, she has no signs or symptoms of cancer recurrence I do not recommend routine surveillance imaging study The patient is educated to watch out for signs and symptoms of cancer recurrence I plan to see her back next year for further follow-up

## 2018-11-25 NOTE — Assessment & Plan Note (Signed)
She has strong family history of colon cancer Her last colonoscopy was unremarkable I reviewed her most recent CT imaging that did not see any abnormalities I recommend laxative therapy and appointment to see gastroenterologist for further management

## 2018-12-22 ENCOUNTER — Encounter (HOSPITAL_COMMUNITY): Payer: Self-pay

## 2019-02-06 ENCOUNTER — Encounter: Payer: Self-pay | Admitting: Family Medicine

## 2019-02-06 ENCOUNTER — Ambulatory Visit (INDEPENDENT_AMBULATORY_CARE_PROVIDER_SITE_OTHER): Payer: Self-pay | Admitting: Family Medicine

## 2019-02-06 VITALS — BP 125/80 | HR 90 | Temp 98.2°F | Resp 16 | Wt 169.8 lb

## 2019-02-06 DIAGNOSIS — J029 Acute pharyngitis, unspecified: Secondary | ICD-10-CM

## 2019-02-06 DIAGNOSIS — R6889 Other general symptoms and signs: Secondary | ICD-10-CM

## 2019-02-06 DIAGNOSIS — J069 Acute upper respiratory infection, unspecified: Secondary | ICD-10-CM

## 2019-02-06 LAB — POCT RAPID STREP A (OFFICE): Rapid Strep A Screen: NEGATIVE

## 2019-02-06 LAB — POCT INFLUENZA A/B
Influenza A, POC: NEGATIVE
Influenza B, POC: NEGATIVE

## 2019-02-06 MED FILL — SYNTHROID 137 MCG TABLET: 137 | 90 days supply | Qty: 90 | Fill #2

## 2019-02-06 NOTE — Progress Notes (Signed)
Maria Simmons is a 53 y.o. female who presents today with  days of 3 days of sore throat and chest congestion. She has been using over the counter theraflu with relief of symptoms. See PMH/SMH below. She reports that before this she was overall healthy and that she was encouraged to seek evaluation by her supervisor. 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    Review of Systems  Constitutional: Negative for chills, fever and malaise/fatigue.  HENT: Positive for sore throat. Negative for congestion, ear discharge, ear pain and sinus pain.   Eyes: Negative.   Respiratory: Negative for cough, sputum production and shortness of breath.   Cardiovascular: Negative.  Negative for chest pain.  Gastrointestinal: Negative for abdominal pain, diarrhea, nausea and vomiting.  Genitourinary: Negative for dysuria, frequency, hematuria and urgency.  Musculoskeletal: Negative for myalgias.  Skin: Negative.   Neurological: Negative for headaches.  Endo/Heme/Allergies: Negative.   Psychiatric/Behavioral: Negative.     Maria Simmons has a current medication list which includes the following prescription(s): biotin, chlorphen-pseudoephed-apap, vitamin b-12, levothyroxine, multivitamin with minerals, vitamin d (cholecalciferol), and acetaminophen. Also is allergic to metformin and related; tape; levaquin [levofloxacin]; and penicillins.  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 Also  has a past surgical history that includes Thyroidectomy (2007); Tubal ligation (1990); Abdominal hysterectomy (2000); Colonoscopy; Lymph node biopsy (Left, 10/12/2014); Laparoscopic gastric banding with hiatal hernia repair (05/28/2009); Portacath placement (Right, 10/24/2014); Port-a-cath removal (N/A, 10/18/2015); Esophagogastroduodenoscopy (egd) with propofol (N/A, 06/19/2016); Colonoscopy with propofol (N/A, 06/19/2016); Total hip arthroplasty (Left, 11/12/2017); Cystocele repair (N/A, 05/03/2018); Vaginal prolapse repair (N/A, 05/03/2018); and Cystoscopy (N/A, 05/03/2018).    O: Vitals:   02/06/19 1215  BP: 125/80  Pulse: 90  Resp: 16  Temp: 98.2 F (36.8 C)  SpO2: 99%     Physical Exam Vitals signs (WNL) reviewed.  Constitutional:      General: She is not in acute distress.    Appearance: Normal appearance. She is well-developed. She is not ill-appearing, toxic-appearing or diaphoretic.  HENT:     Head: Normocephalic.     Right Ear: Hearing, tympanic membrane, ear canal and external ear normal.     Left Ear: Hearing, tympanic membrane, ear canal and external ear normal.     Nose: Rhinorrhea present. No congestion.     Right Sinus: No maxillary sinus tenderness or frontal sinus tenderness.     Left Sinus: No maxillary sinus tenderness or frontal sinus tenderness.     Mouth/Throat:     Lips: Pink.     Mouth: Mucous membranes are moist.     Pharynx: Uvula midline. Posterior oropharyngeal erythema present. No oropharyngeal exudate.  Neck:     Musculoskeletal: Normal range of motion and neck supple.  Cardiovascular:     Rate and Rhythm: Normal rate and regular rhythm.     Pulses: Normal pulses.     Heart sounds: Normal heart sounds.  Pulmonary:     Effort: Pulmonary effort is normal.     Breath sounds: Normal breath sounds.  No decreased breath sounds, wheezing, rhonchi or rales.     Comments: Infrequent dry and  non productive cough on exam. Abdominal:     General: Bowel sounds are normal.     Palpations: Abdomen is soft.  Musculoskeletal: Normal range of motion.  Lymphadenopathy:     Head:     Right side of head: No submental, submandibular or tonsillar adenopathy.     Left side of head: No submental, submandibular or tonsillar adenopathy.     Cervical: No cervical adenopathy.     Right cervical: No superficial cervical adenopathy.    Left cervical: No superficial cervical adenopathy.  Skin:    General: Skin is warm.  Neurological:     Mental Status: She is alert and oriented to person, place, and time.  Psychiatric:        Behavior: Behavior is cooperative.    A: 1. Sore throat   2. Flu-like symptoms   3. Upper respiratory tract infection, unspecified type    P: Influenza and strep both negative. Testing completed due to patient and supervisor wanted reassurance about ability to work in close contact with coworkers- patient has risk factors in [place] that could complicated recovery and was advised if symptoms persist to seek evaluation at location with greater diagnostic capabilities to rule out more complex condition if necessary.. Patient states she found much relief of symptoms from OTC theraflu. She was advised to continue this is she obtained relief along with supportive care techniques or rest, antipyretics and increase hydration.  1. Sore throat - POCT rapid strep A 2. Flu-like symptoms - POCT Influenza A/B  3. Upper respiratory tract infection, unspecified type  Other orders - Chlorphen-Pseudoephed-APAP (THERAFLU FLU/COLD PO); Take by mouth.   Discussed with patient exam findings, suspected diagnosis etiology and  reviewed recommended treatment plan and follow up, including complications and indications for urgent medical follow up and evaluation. Medications including use and indications reviewed with patient. Patient provided relevant patient education on diagnosis and/or  relevant related condition that were discussed and reviewed with patient at discharge. Patient verbalized understanding of information provided and agrees with plan of care (POC), all questions answered.

## 2019-02-06 NOTE — Patient Instructions (Addendum)
Upper Respiratory Infection, Adult  If symptoms not improved in 48-72 hours seek PCP of Urgent Care eval due to diagnostic capabilities.   An upper respiratory infection (URI) affects the nose, throat, and upper air passages. URIs are caused by germs (viruses). The most common type of URI is often called "the common cold." Medicines cannot cure URIs, but you can do things at home to relieve your symptoms. URIs usually get better within 7-10 days. Follow these instructions at home: Activity  Rest as needed.  If you have a fever, stay home from work or school until your fever is gone, or until your doctor says you may return to work or school. ? You should stay home until you cannot spread the infection anymore (you are not contagious). ? Your doctor may have you wear a face mask so you have less risk of spreading the infection. Relieving symptoms  Gargle with a salt-water mixture 3-4 times a day or as needed. To make a salt-water mixture, completely dissolve -1 tsp of salt in 1 cup of warm water.  Use a cool-mist humidifier to add moisture to the air. This can help you breathe more easily. Eating and drinking   Drink enough fluid to keep your pee (urine) pale yellow.  Eat soups and other clear broths. General instructions   Take over-the-counter and prescription medicines only as told by your doctor. These include cold medicines, fever reducers, and cough suppressants.  Do not use any products that contain nicotine or tobacco. These include cigarettes and e-cigarettes. If you need help quitting, ask your doctor.  Avoid being where people are smoking (avoid secondhand smoke).  Make sure you get regular shots and get the flu shot every year.  Keep all follow-up visits as told by your doctor. This is important. How to avoid spreading infection to others   Wash your hands often with soap and water. If you do not have soap and water, use hand sanitizer.  Avoid touching your  mouth, face, eyes, or nose.  Cough or sneeze into a tissue or your sleeve or elbow. Do not cough or sneeze into your hand or into the air. Contact a doctor if:  You are getting worse, not better.  You have any of these: ? A fever. ? Chills. ? Brown or red mucus in your nose. ? Yellow or brown fluid (discharge)coming from your nose. ? Pain in your face, especially when you bend forward. ? Swollen neck glands. ? Pain with swallowing. ? White areas in the back of your throat. Get help right away if:  You have shortness of breath that gets worse.  You have very bad or constant: ? Headache. ? Ear pain. ? Pain in your forehead, behind your eyes, and over your cheekbones (sinus pain). ? Chest pain.  You have long-lasting (chronic) lung disease along with any of these: ? Wheezing. ? Long-lasting cough. ? Coughing up blood. ? A change in your usual mucus.  You have a stiff neck.  You have changes in your: ? Vision. ? Hearing. ? Thinking. ? Mood. Summary  An upper respiratory infection (URI) is caused by a germ called a virus. The most common type of URI is often called "the common cold."  URIs usually get better within 7-10 days.  Take over-the-counter and prescription medicines only as told by your doctor. This information is not intended to replace advice given to you by your health care provider. Make sure you discuss any questions you have with your  health care provider. Document Released: 05/18/2008 Document Revised: 07/23/2017 Document Reviewed: 07/23/2017 Elsevier Interactive Patient Education  2019 [employer].

## 2019-02-08 ENCOUNTER — Telehealth: Payer: Self-pay

## 2019-02-08 NOTE — Telephone Encounter (Signed)
Called to follow up with pt regarding how she is feeling since her visit with [place] and pt states she is feeling better.

## 2019-05-09 DIAGNOSIS — C73 Malignant neoplasm of thyroid gland: Secondary | ICD-10-CM | POA: Diagnosis not present

## 2019-05-09 DIAGNOSIS — E890 Postprocedural hypothyroidism: Secondary | ICD-10-CM | POA: Diagnosis not present

## 2019-05-09 DIAGNOSIS — E559 Vitamin D deficiency, unspecified: Secondary | ICD-10-CM | POA: Diagnosis not present

## 2019-05-09 DIAGNOSIS — E1165 Type 2 diabetes mellitus with hyperglycemia: Secondary | ICD-10-CM | POA: Diagnosis not present

## 2019-05-09 DIAGNOSIS — E7800 Pure hypercholesterolemia, unspecified: Secondary | ICD-10-CM | POA: Diagnosis not present

## 2019-05-12 DIAGNOSIS — I10 Essential (primary) hypertension: Secondary | ICD-10-CM | POA: Diagnosis not present

## 2019-05-12 DIAGNOSIS — C73 Malignant neoplasm of thyroid gland: Secondary | ICD-10-CM | POA: Diagnosis not present

## 2019-05-12 DIAGNOSIS — E7800 Pure hypercholesterolemia, unspecified: Secondary | ICD-10-CM | POA: Diagnosis not present

## 2019-05-12 DIAGNOSIS — E1165 Type 2 diabetes mellitus with hyperglycemia: Secondary | ICD-10-CM | POA: Diagnosis not present

## 2019-05-12 DIAGNOSIS — E559 Vitamin D deficiency, unspecified: Secondary | ICD-10-CM | POA: Diagnosis not present

## 2019-05-12 DIAGNOSIS — E890 Postprocedural hypothyroidism: Secondary | ICD-10-CM | POA: Diagnosis not present

## 2019-05-12 DIAGNOSIS — D179 Benign lipomatous neoplasm, unspecified: Secondary | ICD-10-CM | POA: Diagnosis not present

## 2019-05-12 MED FILL — SYNTHROID 137 MCG TABLET: 137 | 90 days supply | Qty: 90 | Fill #0

## 2019-05-12 MED FILL — VIT D2 1.25 MG (50,000 UNIT: 1.25 MG | 28 days supply | Qty: 4 | Fill #0

## 2019-06-07 MED FILL — VIT D2 1.25 MG (50,000 UNIT: 1.25 MG | 28 days supply | Qty: 4 | Fill #1

## 2019-07-13 ENCOUNTER — Other Ambulatory Visit: Payer: Self-pay | Admitting: Surgery

## 2019-07-13 DIAGNOSIS — E890 Postprocedural hypothyroidism: Secondary | ICD-10-CM | POA: Diagnosis not present

## 2019-07-13 DIAGNOSIS — E7800 Pure hypercholesterolemia, unspecified: Secondary | ICD-10-CM | POA: Diagnosis not present

## 2019-07-13 DIAGNOSIS — R2231 Localized swelling, mass and lump, right upper limb: Secondary | ICD-10-CM | POA: Diagnosis not present

## 2019-07-13 DIAGNOSIS — E559 Vitamin D deficiency, unspecified: Secondary | ICD-10-CM | POA: Diagnosis not present

## 2019-07-14 MED FILL — VIT D2 1.25 MG (50,000 UNIT: 1.25 MG | 28 days supply | Qty: 4 | Fill #2

## 2019-07-22 DIAGNOSIS — M25551 Pain in right hip: Secondary | ICD-10-CM | POA: Diagnosis not present

## 2019-07-22 DIAGNOSIS — M25561 Pain in right knee: Secondary | ICD-10-CM | POA: Diagnosis not present

## 2019-07-24 ENCOUNTER — Other Ambulatory Visit: Payer: Self-pay

## 2019-07-24 ENCOUNTER — Other Ambulatory Visit (HOSPITAL_COMMUNITY): Payer: Self-pay

## 2019-07-24 DIAGNOSIS — M25561 Pain in right knee: Secondary | ICD-10-CM

## 2019-08-03 DIAGNOSIS — Z1159 Encounter for screening for other viral diseases: Secondary | ICD-10-CM | POA: Diagnosis not present

## 2019-08-04 ENCOUNTER — Ambulatory Visit (HOSPITAL_COMMUNITY)
Admission: RE | Admit: 2019-08-04 | Discharge: 2019-08-04 | Disposition: A | Discharge status: Home / Self Care | Payer: 59 | Source: Ambulatory Visit | Attending: Orthopedic Surgery | Admitting: Orthopedic Surgery

## 2019-08-04 DIAGNOSIS — M25561 Pain in right knee: Secondary | ICD-10-CM | POA: Insufficient documentation

## 2019-08-04 DIAGNOSIS — Z85850 Personal history of malignant neoplasm of thyroid: Secondary | ICD-10-CM | POA: Diagnosis not present

## 2019-08-04 DIAGNOSIS — M1711 Unilateral primary osteoarthritis, right knee: Secondary | ICD-10-CM | POA: Diagnosis not present

## 2019-08-04 DIAGNOSIS — M7121 Synovial cyst of popliteal space [Baker], right knee: Secondary | ICD-10-CM | POA: Diagnosis not present

## 2019-08-04 DIAGNOSIS — Z8572 Personal history of non-Hodgkin lymphomas: Secondary | ICD-10-CM | POA: Diagnosis not present

## 2019-08-04 DIAGNOSIS — M85661 Other cyst of bone, right lower leg: Secondary | ICD-10-CM | POA: Diagnosis not present

## 2019-08-07 ENCOUNTER — Other Ambulatory Visit: Payer: Self-pay | Admitting: Surgery

## 2019-08-07 DIAGNOSIS — D1721 Benign lipomatous neoplasm of skin and subcutaneous tissue of right arm: Secondary | ICD-10-CM | POA: Diagnosis not present

## 2019-08-07 MED FILL — traMADol HCL 50 MG TABS: 50 | 6 days supply | Qty: 25 | Fill #0

## 2019-08-08 DIAGNOSIS — M1611 Unilateral primary osteoarthritis, right hip: Secondary | ICD-10-CM | POA: Diagnosis not present

## 2019-08-08 DIAGNOSIS — M25561 Pain in right knee: Secondary | ICD-10-CM | POA: Diagnosis not present

## 2019-08-17 ENCOUNTER — Other Ambulatory Visit: Payer: Self-pay | Admitting: Orthopedic Surgery

## 2019-08-17 MED FILL — VIT D2 1.25 MG (50,000 UNIT: 1.25 MG | 28 days supply | Qty: 4 | Fill #3

## 2019-09-06 NOTE — Patient Instructions (Addendum)
DUE TO COVID-19 ONLY ONE VISITOR IS ALLOWED TO COME WITH YOU AND STAY IN THE WAITING ROOM ONLY DURING PRE OP AND PROCEDURE DAY OF SURGERY. THE 1 VISITOR MAY VISIT WITH YOU AFTER SURGERY IN YOUR PRIVATE ROOM DURING VISITING HOURS ONLY!  YOU NEED TO HAVE A COVID 19 TEST ON 09-12-19  @ 2:35 PM, THIS TEST MUST BE DONE BEFORE SURGERY, COME  [facility], [place] , [id number].  ([place]) ONCE YOUR COVID TEST IS COMPLETED, PLEASE BEGIN THE QUARANTINE INSTRUCTIONS AS OUTLINED IN YOUR HANDOUT.                Maria Simmons  09/06/2019   Your procedure is scheduled on: 09-15-19    Report to [facility] Main  Entrance    Report to Short Stay at 5:30 AM     Call this number if you have problems the morning of surgery [phone]    Remember: NO SOLID FOOD AFTER MIDNIGHT THE NIGHT PRIOR TO SURGERY. NOTHING BY MOUTH EXCEPT CLEAR LIQUIDS UNTIL 4:15 AM . PLEASE FINISH GATORADE DRINK PER SURGEON ORDER  WHICH NEEDS TO BE COMPLETED AT 4:15 AM.     CLEAR LIQUID DIET   Foods Allowed                                                                     Foods Excluded  Coffee and tea, regular and decaf                             liquids that you cannot  Plain Jell-O any favor except red or purple                                           see through such as: Fruit ices (not with fruit pulp)                                     milk, soups, orange juice  Iced Popsicles                                    All solid food Carbonated beverages, regular and diet                                    Cranberry, grape and apple juices Sports drinks like Gatorade Lightly seasoned clear broth or consume(fat free) Sugar, honey syrup   _____________________________________________________________________     Take these medicines the morning of surgery with A SIP OF WATER: Levothyroxine (Synthroid)  BRUSH YOUR TEETH MORNING OF SURGERY AND RINSE YOUR MOUTH OUT, NO CHEWING GUM  CANDY OR MINTS.                                 You may not have any metal  on your body including hair pins and              piercings     Do not wear jewelry, make-up, lotions, powders or perfumes, deodorant              Do not wear nail polish.  Do not shave  48 hours prior to surgery.              Do not bring valuables to the hospital. [place].  Contacts, dentures or bridgework may not be worn into surgery.  Leave suitcase in the car. After surgery it may be brought to your room.      Special Instructions: N/A              Please read over the following fact sheets you were given: _____________________________________________________________________             [facility] - Preparing for Surgery Before surgery, you can play an important role.  Because skin is not sterile, your skin needs to be as free of germs as possible.  You can reduce the number of germs on your skin by washing with CHG (chlorahexidine gluconate) soap before surgery.  CHG is an antiseptic cleaner which kills germs and bonds with the skin to continue killing germs even after washing. Please DO NOT use if you have an allergy to CHG or antibacterial soaps.  If your skin becomes reddened/irritated stop using the CHG and inform your nurse when you arrive at Short Stay. Do not shave (including legs and underarms) for at least 48 hours prior to the first CHG shower.  You may shave your face/neck. Please follow these instructions carefully:  1.  Shower with CHG Soap the night before surgery and the  morning of Surgery.  2.  If you choose to wash your hair, wash your hair first as usual with your  normal  shampoo.  3.  After you shampoo, rinse your hair and body thoroughly to remove the  shampoo.                           4.  Use CHG as you would any other liquid soap.  You can apply chg directly  to the skin and wash                       Gently with a scrungie or  clean washcloth.  5.  Apply the CHG Soap to your body ONLY FROM THE NECK DOWN.   Do not use on face/ open                           Wound or open sores. Avoid contact with eyes, ears mouth and genitals (private parts).                       Wash face,  Genitals (private parts) with your normal soap.             6.  Wash thoroughly, paying special attention to the area where your surgery  will be performed.  7.  Thoroughly rinse your body with warm water from the neck down.  8.  DO NOT shower/wash with your normal soap after using and rinsing off  the CHG  Soap.                9.  Pat yourself dry with a clean towel.            10.  Wear clean pajamas.            11.  Place clean sheets on your bed the night of your first shower and do not  sleep with pets. Day of Surgery : Do not apply any lotions/deodorants the morning of surgery.  Please wear clean clothes to the hospital/surgery center.  FAILURE TO FOLLOW THESE INSTRUCTIONS MAY RESULT IN THE CANCELLATION OF YOUR SURGERY PATIENT SIGNATURE_________________________________  NURSE SIGNATURE__________________________________  ________________________________________________________________________   Adam Phenix  An incentive spirometer is a tool that can help keep your lungs clear and active. This tool measures how well you are filling your lungs with each breath. Taking long deep breaths may help reverse or decrease the chance of developing breathing (pulmonary) problems (especially infection) following:  A long period of time when you are unable to move or be active. BEFORE THE PROCEDURE   If the spirometer includes an indicator to show your best effort, your nurse or respiratory therapist will set it to a desired goal.  If possible, sit up straight or lean slightly forward. Try not to slouch.  Hold the incentive spirometer in an upright position. INSTRUCTIONS FOR USE  1. Sit on the edge of your bed if possible, or sit up as  far as you can in bed or on a chair. 2. Hold the incentive spirometer in an upright position. 3. Breathe out normally. 4. Place the mouthpiece in your mouth and seal your lips tightly around it. 5. Breathe in slowly and as deeply as possible, raising the piston or the ball toward the top of the column. 6. Hold your breath for 3-5 seconds or for as long as possible. Allow the piston or ball to fall to the bottom of the column. 7. Remove the mouthpiece from your mouth and breathe out normally. 8. Rest for a few seconds and repeat Steps 1 through 7 at least 10 times every 1-2 hours when you are awake. Take your time and take a few normal breaths between deep breaths. 9. The spirometer may include an indicator to show your best effort. Use the indicator as a goal to work toward during each repetition. 10. After each set of 10 deep breaths, practice coughing to be sure your lungs are clear. If you have an incision (the cut made at the time of surgery), support your incision when coughing by placing a pillow or rolled up towels firmly against it. Once you are able to get out of bed, walk around indoors and cough well. You may stop using the incentive spirometer when instructed by your caregiver.  RISKS AND COMPLICATIONS  Take your time so you do not get dizzy or light-headed.  If you are in pain, you may need to take or ask for pain medication before doing incentive spirometry. It is harder to take a deep breath if you are having pain. AFTER USE  Rest and breathe slowly and easily.  It can be helpful to keep track of a log of your progress. Your caregiver can provide you with a simple table to help with this. If you are using the spirometer at home, follow these instructions: [place] IF:   You are having difficultly using the spirometer.  You have trouble using the spirometer as often as instructed.  Your pain medication is not giving enough relief while using the spirometer.  You  develop fever of 100.5 F (38.1 C) or higher. SEEK IMMEDIATE MEDICAL CARE IF:   You cough up bloody sputum that had not been present before.  You develop fever of 102 F (38.9 C) or greater.  You develop worsening pain at or near the incision site. MAKE SURE YOU:   Understand these instructions.  Will watch your condition.  Will get help right away if you are not doing well or get worse. Document Released: 04/12/2007 Document Revised: 02/22/2012 Document Reviewed: 06/13/2007 [facility] Patient Information 2014 [place], [place].   ________________________________________________________________________

## 2019-09-06 NOTE — Progress Notes (Signed)
PCP - Bindubal Balan Cardiologist -   Chest x-ray - 09-08-19 EKG - 09-08-19  Stress Test -  ECHO -  Cardiac Cath -   Sleep Study -  CPAP -   Fasting Blood Sugar -  Checks Blood Sugar _____ times a day  Blood Thinner Instructions: Aspirin Instructions: Last Dose:  Anesthesia review:   Patient denies shortness of breath, fever, cough and chest pain at PAT appointment   Patient verbalized understanding of instructions that were given to them at the PAT appointment. Patient was also instructed that they will need to review over the PAT instructions again at home before surgery.

## 2019-09-08 ENCOUNTER — Other Ambulatory Visit: Payer: Self-pay

## 2019-09-08 ENCOUNTER — Encounter (HOSPITAL_COMMUNITY)
Admission: RE | Admit: 2019-09-08 | Discharge: 2019-09-08 | Disposition: A | Discharge status: Home / Self Care | Payer: 59 | Source: Ambulatory Visit | Attending: Orthopedic Surgery | Admitting: Orthopedic Surgery

## 2019-09-08 ENCOUNTER — Ambulatory Visit (HOSPITAL_COMMUNITY)
Admission: RE | Admit: 2019-09-08 | Discharge: 2019-09-08 | Disposition: A | Discharge status: Home / Self Care | Payer: 59 | Source: Ambulatory Visit | Attending: Orthopedic Surgery | Admitting: Orthopedic Surgery

## 2019-09-08 ENCOUNTER — Encounter (HOSPITAL_COMMUNITY): Payer: Self-pay

## 2019-09-08 DIAGNOSIS — Z01811 Encounter for preprocedural respiratory examination: Secondary | ICD-10-CM | POA: Diagnosis not present

## 2019-09-08 DIAGNOSIS — Z01818 Encounter for other preprocedural examination: Secondary | ICD-10-CM | POA: Diagnosis not present

## 2019-09-08 LAB — URINALYSIS, ROUTINE W REFLEX MICROSCOPIC
Bilirubin Urine: NEGATIVE
Glucose, UA: NEGATIVE mg/dL
Ketones, ur: NEGATIVE mg/dL
Nitrite: NEGATIVE
Protein, ur: NEGATIVE mg/dL
Specific Gravity, Urine: 1.023 (ref 1.005–1.030)
pH: 5 (ref 5.0–8.0)

## 2019-09-08 LAB — CBC WITH DIFFERENTIAL/PLATELET
Abs Immature Granulocytes: 0.01 10*3/uL (ref 0.00–0.07)
Basophils Absolute: 0 10*3/uL (ref 0.0–0.1)
Basophils Relative: 1 %
Eosinophils Absolute: 0.1 10*3/uL (ref 0.0–0.5)
Eosinophils Relative: 2 %
HCT: 38.9 % (ref 36.0–46.0)
Hemoglobin: 12.4 g/dL (ref 12.0–15.0)
Immature Granulocytes: 0 %
Lymphocytes Relative: 41 %
Lymphs Abs: 1.4 10*3/uL (ref 0.7–4.0)
MCH: 28.1 pg (ref 26.0–34.0)
MCHC: 31.9 g/dL (ref 30.0–36.0)
MCV: 88.2 fL (ref 80.0–100.0)
Monocytes Absolute: 0.3 10*3/uL (ref 0.1–1.0)
Monocytes Relative: 9 %
Neutro Abs: 1.6 10*3/uL — ABNORMAL LOW (ref 1.7–7.7)
Neutrophils Relative %: 47 %
Platelets: 343 10*3/uL (ref 150–400)
RBC: 4.41 MIL/uL (ref 3.87–5.11)
RDW: 13.8 % (ref 11.5–15.5)
WBC: 3.3 10*3/uL — ABNORMAL LOW (ref 4.0–10.5)
nRBC: 0 % (ref 0.0–0.2)

## 2019-09-08 LAB — COMPREHENSIVE METABOLIC PANEL
ALT: 12 U/L (ref 0–44)
AST: 19 U/L (ref 15–41)
Albumin: 4 g/dL (ref 3.5–5.0)
Alkaline Phosphatase: 61 U/L (ref 38–126)
Anion gap: 9 (ref 5–15)
BUN: 13 mg/dL (ref 6–20)
CO2: 30 mmol/L (ref 22–32)
Calcium: 9 mg/dL (ref 8.9–10.3)
Chloride: 101 mmol/L (ref 98–111)
Creatinine, Ser: 1.03 mg/dL — ABNORMAL HIGH (ref 0.44–1.00)
GFR calc Af Amer: >60 mL/min (ref >60)
GFR calc non Af Amer: >60 mL/min (ref >60)
Glucose, Bld: 112 mg/dL — ABNORMAL HIGH (ref 70–99)
Potassium: 4 mmol/L (ref 3.5–5.1)
Sodium: 140 mmol/L (ref 135–145)
Total Bilirubin: 0.6 mg/dL (ref 0.3–1.2)
Total Protein: 7.4 g/dL (ref 6.5–8.1)

## 2019-09-08 LAB — PROTIME-INR
INR: 1.1 (ref 0.8–1.2)
Prothrombin Time: 13.6 seconds (ref 11.4–15.2)

## 2019-09-08 LAB — SURGICAL PCR SCREEN
MRSA, PCR: NEGATIVE
Staphylococcus aureus: NEGATIVE

## 2019-09-08 LAB — GLUCOSE, CAPILLARY: Glucose-Capillary: 105 mg/dL — ABNORMAL HIGH (ref 70–99)

## 2019-09-08 LAB — HEMOGLOBIN A1C
Hgb A1c MFr Bld: 5.9 % — ABNORMAL HIGH (ref 4.8–5.6)
Mean Plasma Glucose: 122.63 mg/dL

## 2019-09-08 LAB — APTT: aPTT: 30 seconds (ref 24–36)

## 2019-09-11 MED FILL — SULFAMETHOXAZOLE-TMP DS TAB: 800-160 | 5 days supply | Qty: 10 | Fill #0

## 2019-09-12 ENCOUNTER — Other Ambulatory Visit (HOSPITAL_COMMUNITY)
Admission: RE | Admit: 2019-09-12 | Discharge: 2019-09-12 | Disposition: A | Discharge status: Home / Self Care | Payer: 59 | Source: Ambulatory Visit | Attending: Orthopedic Surgery | Admitting: Orthopedic Surgery

## 2019-09-12 DIAGNOSIS — M1611 Unilateral primary osteoarthritis, right hip: Secondary | ICD-10-CM | POA: Diagnosis not present

## 2019-09-12 DIAGNOSIS — Z20828 Contact with and (suspected) exposure to other viral communicable diseases: Secondary | ICD-10-CM | POA: Insufficient documentation

## 2019-09-12 DIAGNOSIS — Z01812 Encounter for preprocedural laboratory examination: Secondary | ICD-10-CM | POA: Insufficient documentation

## 2019-09-13 LAB — NOVEL CORONAVIRUS, NAA (HOSP ORDER, SEND-OUT TO REF LAB; TAT 18-24 HRS): SARS-CoV-2, NAA: NOT DETECTED

## 2019-09-14 MED ORDER — BUPIVACAINE LIPOSOME 1.3 % IJ SUSP
20.0000 mL | Freq: Once | INTRAMUSCULAR | Status: DC
  Filled 2019-09-14: qty 20

## 2019-09-15 ENCOUNTER — Encounter (HOSPITAL_COMMUNITY)
Admission: RE | Disposition: A | Discharge status: Home Health | Payer: Self-pay | Source: Home / Self Care | Attending: Orthopedic Surgery

## 2019-09-15 ENCOUNTER — Ambulatory Visit (HOSPITAL_COMMUNITY): Payer: 59

## 2019-09-15 ENCOUNTER — Ambulatory Visit (HOSPITAL_COMMUNITY)
Admission: RE | Admit: 2019-09-15 | Discharge: 2019-09-16 | Disposition: A | Discharge status: Home Health | Payer: 59 | Attending: Orthopedic Surgery | Admitting: Orthopedic Surgery

## 2019-09-15 ENCOUNTER — Encounter (HOSPITAL_COMMUNITY): Payer: Self-pay

## 2019-09-15 ENCOUNTER — Ambulatory Visit (HOSPITAL_COMMUNITY): Payer: 59 | Admitting: Physician Assistant

## 2019-09-15 ENCOUNTER — Other Ambulatory Visit: Payer: Self-pay

## 2019-09-15 ENCOUNTER — Ambulatory Visit (HOSPITAL_COMMUNITY): Payer: 59 | Admitting: Certified Registered"

## 2019-09-15 DIAGNOSIS — Z880 Allergy status to penicillin: Secondary | ICD-10-CM | POA: Insufficient documentation

## 2019-09-15 DIAGNOSIS — Z85850 Personal history of malignant neoplasm of thyroid: Secondary | ICD-10-CM | POA: Diagnosis not present

## 2019-09-15 DIAGNOSIS — Z8049 Family history of malignant neoplasm of other genital organs: Secondary | ICD-10-CM | POA: Insufficient documentation

## 2019-09-15 DIAGNOSIS — G622 Polyneuropathy due to other toxic agents: Secondary | ICD-10-CM | POA: Insufficient documentation

## 2019-09-15 DIAGNOSIS — Z8572 Personal history of non-Hodgkin lymphomas: Secondary | ICD-10-CM | POA: Insufficient documentation

## 2019-09-15 DIAGNOSIS — D519 Vitamin B12 deficiency anemia, unspecified: Secondary | ICD-10-CM | POA: Insufficient documentation

## 2019-09-15 DIAGNOSIS — E039 Hypothyroidism, unspecified: Secondary | ICD-10-CM | POA: Diagnosis not present

## 2019-09-15 DIAGNOSIS — Z888 Allergy status to other drugs, medicaments and biological substances status: Secondary | ICD-10-CM | POA: Insufficient documentation

## 2019-09-15 DIAGNOSIS — Z90710 Acquired absence of both cervix and uterus: Secondary | ICD-10-CM | POA: Diagnosis not present

## 2019-09-15 DIAGNOSIS — Z923 Personal history of irradiation: Secondary | ICD-10-CM | POA: Insufficient documentation

## 2019-09-15 DIAGNOSIS — Z471 Aftercare following joint replacement surgery: Secondary | ICD-10-CM | POA: Diagnosis not present

## 2019-09-15 DIAGNOSIS — E890 Postprocedural hypothyroidism: Secondary | ICD-10-CM | POA: Insufficient documentation

## 2019-09-15 DIAGNOSIS — Z8042 Family history of malignant neoplasm of prostate: Secondary | ICD-10-CM | POA: Diagnosis not present

## 2019-09-15 DIAGNOSIS — Z800 Family history of malignant neoplasm of digestive organs: Secondary | ICD-10-CM | POA: Diagnosis not present

## 2019-09-15 DIAGNOSIS — M1611 Unilateral primary osteoarthritis, right hip: Secondary | ICD-10-CM | POA: Diagnosis present

## 2019-09-15 DIAGNOSIS — Z96641 Presence of right artificial hip joint: Secondary | ICD-10-CM | POA: Diagnosis not present

## 2019-09-15 DIAGNOSIS — D509 Iron deficiency anemia, unspecified: Secondary | ICD-10-CM | POA: Insufficient documentation

## 2019-09-15 DIAGNOSIS — Z96642 Presence of left artificial hip joint: Secondary | ICD-10-CM | POA: Diagnosis not present

## 2019-09-15 DIAGNOSIS — D72818 Other decreased white blood cell count: Secondary | ICD-10-CM | POA: Insufficient documentation

## 2019-09-15 DIAGNOSIS — T451X5A Adverse effect of antineoplastic and immunosuppressive drugs, initial encounter: Secondary | ICD-10-CM | POA: Insufficient documentation

## 2019-09-15 DIAGNOSIS — Z823 Family history of stroke: Secondary | ICD-10-CM | POA: Insufficient documentation

## 2019-09-15 DIAGNOSIS — Z91048 Other nonmedicinal substance allergy status: Secondary | ICD-10-CM | POA: Insufficient documentation

## 2019-09-15 DIAGNOSIS — Z8249 Family history of ischemic heart disease and other diseases of the circulatory system: Secondary | ICD-10-CM | POA: Insufficient documentation

## 2019-09-15 DIAGNOSIS — Z419 Encounter for procedure for purposes other than remedying health state, unspecified: Secondary | ICD-10-CM

## 2019-09-15 HISTORY — PX: TOTAL HIP ARTHROPLASTY: SHX124

## 2019-09-15 LAB — TYPE AND SCREEN
ABO/RH(D): B POS
Antibody Screen: NEGATIVE

## 2019-09-15 LAB — GLUCOSE, CAPILLARY: Glucose-Capillary: 125 mg/dL — ABNORMAL HIGH (ref 70–99)

## 2019-09-15 SURGERY — ARTHROPLASTY, HIP, TOTAL, ANTERIOR APPROACH
Anesthesia: Spinal | Site: Hip | Laterality: Right

## 2019-09-15 MED ORDER — LACTATED RINGERS IV SOLN
INTRAVENOUS | Status: DC
  Administered 2019-09-15 (×2): via INTRAVENOUS

## 2019-09-15 MED ORDER — VANCOMYCIN HCL IN DEXTROSE 1-5 GM/200ML-% IV SOLN
INTRAVENOUS | Status: AC
  Administered 2019-09-15: 1000 mg via INTRAVENOUS
  Filled 2019-09-15: qty 200

## 2019-09-15 MED ORDER — DEXAMETHASONE SODIUM PHOSPHATE 10 MG/ML IJ SOLN
INTRAMUSCULAR | Status: DC | PRN
  Administered 2019-09-15: 8 mg via INTRAVENOUS

## 2019-09-15 MED ORDER — METOCLOPRAMIDE HCL 5 MG/ML IJ SOLN: 10.0000 mg | Freq: Once | INTRAMUSCULAR | Status: DC | PRN

## 2019-09-15 MED ORDER — DOCUSATE SODIUM 100 MG PO CAPS
100.0000 mg | ORAL_CAPSULE | Freq: Two times a day (BID) | ORAL | Status: DC
  Administered 2019-09-15 – 2019-09-16 (×3): 100 mg via ORAL
  Filled 2019-09-15 (×3): qty 1

## 2019-09-15 MED ORDER — LEVOTHYROXINE SODIUM 25 MCG PO TABS
137.0000 ug | ORAL_TABLET | Freq: Every day | ORAL | Status: DC
  Administered 2019-09-16: 137 ug via ORAL
  Filled 2019-09-15: qty 1

## 2019-09-15 MED ORDER — TRANEXAMIC ACID-NACL 1000-0.7 MG/100ML-% IV SOLN
1000.0000 mg | INTRAVENOUS | Status: AC
  Administered 2019-09-15: 1000 mg via INTRAVENOUS

## 2019-09-15 MED ORDER — FENTANYL CITRATE (PF) 100 MCG/2ML IJ SOLN
INTRAMUSCULAR | Status: AC
  Administered 2019-09-15: 50 ug via INTRAVENOUS
  Filled 2019-09-15: qty 2

## 2019-09-15 MED ORDER — TIZANIDINE HCL 2 MG PO TABS: 2.0000 mg | ORAL_TABLET | Freq: Three times a day (TID) | ORAL | 0 refills | Status: DC | PRN

## 2019-09-15 MED ORDER — DIPHENHYDRAMINE HCL 12.5 MG/5ML PO ELIX: 12.5000 mg | ORAL_SOLUTION | ORAL | Status: DC | PRN

## 2019-09-15 MED ORDER — SODIUM CHLORIDE 0.9 % IR SOLN
Status: DC | PRN
  Administered 2019-09-15: 1000 mL

## 2019-09-15 MED ORDER — MEPERIDINE HCL 50 MG/ML IJ SOLN: 6.2500 mg | INTRAMUSCULAR | Status: DC | PRN

## 2019-09-15 MED ORDER — PHENYLEPHRINE 40 MCG/ML (10ML) SYRINGE FOR IV PUSH (FOR BLOOD PRESSURE SUPPORT)
PREFILLED_SYRINGE | INTRAVENOUS | Status: AC
  Filled 2019-09-15: qty 10

## 2019-09-15 MED ORDER — METHOCARBAMOL 500 MG PO TABS
500.0000 mg | ORAL_TABLET | Freq: Four times a day (QID) | ORAL | Status: DC | PRN
  Administered 2019-09-16 (×2): 500 mg via ORAL
  Filled 2019-09-15 (×2): qty 1

## 2019-09-15 MED ORDER — OXYCODONE-ACETAMINOPHEN 5-325 MG PO TABS: 1.0000 | ORAL_TABLET | Freq: Four times a day (QID) | ORAL | 0 refills | Status: DC | PRN

## 2019-09-15 MED ORDER — VANCOMYCIN HCL IN DEXTROSE 1-5 GM/200ML-% IV SOLN
1000.0000 mg | Freq: Two times a day (BID) | INTRAVENOUS | Status: AC
  Administered 2019-09-15: 1000 mg via INTRAVENOUS
  Filled 2019-09-15: qty 200

## 2019-09-15 MED ORDER — BUPIVACAINE IN DEXTROSE 0.75-8.25 % IT SOLN
INTRATHECAL | Status: DC | PRN
  Administered 2019-09-15: 1.6 mL via INTRATHECAL

## 2019-09-15 MED ORDER — ALUM & MAG HYDROXIDE-SIMETH 200-200-20 MG/5ML PO SUSP: 30.0000 mL | ORAL | Status: DC | PRN

## 2019-09-15 MED ORDER — PROPOFOL 10 MG/ML IV BOLUS
INTRAVENOUS | Status: AC
  Filled 2019-09-15: qty 80

## 2019-09-15 MED ORDER — PROPOFOL 500 MG/50ML IV EMUL
INTRAVENOUS | Status: DC | PRN
  Administered 2019-09-15: 135 ug/kg/min via INTRAVENOUS

## 2019-09-15 MED ORDER — ONDANSETRON HCL 4 MG PO TABS: 4.0000 mg | ORAL_TABLET | Freq: Four times a day (QID) | ORAL | Status: DC | PRN

## 2019-09-15 MED ORDER — ONDANSETRON HCL 4 MG/2ML IJ SOLN: 4.0000 mg | Freq: Four times a day (QID) | INTRAMUSCULAR | Status: DC | PRN

## 2019-09-15 MED ORDER — MAGNESIUM CITRATE PO SOLN: 1.0000 | Freq: Once | ORAL | Status: DC | PRN

## 2019-09-15 MED ORDER — FENTANYL CITRATE (PF) 100 MCG/2ML IJ SOLN
INTRAMUSCULAR | Status: AC
  Filled 2019-09-15: qty 2

## 2019-09-15 MED ORDER — TRANEXAMIC ACID-NACL 1000-0.7 MG/100ML-% IV SOLN
1000.0000 mg | Freq: Once | INTRAVENOUS | Status: AC
  Administered 2019-09-15: 1000 mg via INTRAVENOUS
  Filled 2019-09-15: qty 100

## 2019-09-15 MED ORDER — PROPOFOL 500 MG/50ML IV EMUL
INTRAVENOUS | Status: AC
  Filled 2019-09-15: qty 50

## 2019-09-15 MED ORDER — ACETAMINOPHEN 325 MG PO TABS
325.0000 mg | ORAL_TABLET | Freq: Four times a day (QID) | ORAL | Status: DC | PRN
  Administered 2019-09-16: 650 mg via ORAL
  Filled 2019-09-15: qty 2

## 2019-09-15 MED ORDER — DEXAMETHASONE SODIUM PHOSPHATE 10 MG/ML IJ SOLN
10.0000 mg | Freq: Two times a day (BID) | INTRAMUSCULAR | Status: DC
  Administered 2019-09-16: 10 mg via INTRAVENOUS
  Filled 2019-09-15: qty 1

## 2019-09-15 MED ORDER — WATER FOR IRRIGATION, STERILE IR SOLN
Status: DC | PRN
  Administered 2019-09-15: 2000 mL

## 2019-09-15 MED ORDER — DOCUSATE SODIUM 100 MG PO CAPS: 100.0000 mg | ORAL_CAPSULE | Freq: Two times a day (BID) | ORAL | 0 refills | Status: DC

## 2019-09-15 MED ORDER — METHOCARBAMOL 500 MG IVPB - SIMPLE MED
500.0000 mg | Freq: Four times a day (QID) | INTRAVENOUS | Status: DC | PRN
  Administered 2019-09-15: 10:00:00 500 mg via INTRAVENOUS
  Filled 2019-09-15: qty 50

## 2019-09-15 MED ORDER — BUPIVACAINE-EPINEPHRINE 0.5% -1:200000 IJ SOLN
INTRAMUSCULAR | Status: DC | PRN
  Administered 2019-09-15: 30 mL

## 2019-09-15 MED ORDER — GABAPENTIN 300 MG PO CAPS
300.0000 mg | ORAL_CAPSULE | Freq: Two times a day (BID) | ORAL | Status: DC
  Administered 2019-09-15 – 2019-09-16 (×3): 300 mg via ORAL
  Filled 2019-09-15 (×3): qty 1

## 2019-09-15 MED ORDER — POLYETHYLENE GLYCOL 3350 17 G PO PACK: 17.0000 g | PACK | Freq: Every day | ORAL | Status: DC | PRN

## 2019-09-15 MED ORDER — BUPIVACAINE-EPINEPHRINE 0.5% -1:200000 IJ SOLN
INTRAMUSCULAR | Status: AC
  Filled 2019-09-15: qty 1

## 2019-09-15 MED ORDER — ASPIRIN EC 325 MG PO TBEC: 325.0000 mg | DELAYED_RELEASE_TABLET | Freq: Two times a day (BID) | ORAL | 0 refills | Status: DC

## 2019-09-15 MED ORDER — LACTATED RINGERS IV SOLN: INTRAVENOUS | Status: DC

## 2019-09-15 MED ORDER — MIDAZOLAM HCL 2 MG/2ML IJ SOLN
INTRAMUSCULAR | Status: AC
  Filled 2019-09-15: qty 2

## 2019-09-15 MED ORDER — BISACODYL 5 MG PO TBEC: 5.0000 mg | DELAYED_RELEASE_TABLET | Freq: Every day | ORAL | Status: DC | PRN

## 2019-09-15 MED ORDER — VANCOMYCIN HCL IN DEXTROSE 1-5 GM/200ML-% IV SOLN
1000.0000 mg | INTRAVENOUS | Status: AC
  Administered 2019-09-15: 06:00:00 1000 mg via INTRAVENOUS

## 2019-09-15 MED ORDER — CHLORHEXIDINE GLUCONATE 4 % EX LIQD: 60.0000 mL | Freq: Once | CUTANEOUS | Status: DC

## 2019-09-15 MED ORDER — BUPIVACAINE LIPOSOME 1.3 % IJ SUSP
INTRAMUSCULAR | Status: DC | PRN
  Administered 2019-09-15: 10 mL

## 2019-09-15 MED ORDER — PHENYLEPHRINE 40 MCG/ML (10ML) SYRINGE FOR IV PUSH (FOR BLOOD PRESSURE SUPPORT)
PREFILLED_SYRINGE | INTRAVENOUS | Status: DC | PRN
  Administered 2019-09-15: 120 ug via INTRAVENOUS

## 2019-09-15 MED ORDER — SODIUM CHLORIDE 0.9 % IV SOLN
INTRAVENOUS | Status: DC
  Administered 2019-09-15 (×2): via INTRAVENOUS

## 2019-09-15 MED ORDER — HYDROMORPHONE HCL 1 MG/ML IJ SOLN: 0.5000 mg | INTRAMUSCULAR | Status: DC | PRN

## 2019-09-15 MED ORDER — ASPIRIN EC 325 MG PO TBEC
325.0000 mg | DELAYED_RELEASE_TABLET | Freq: Two times a day (BID) | ORAL | Status: DC
  Administered 2019-09-15 – 2019-09-16 (×2): 325 mg via ORAL
  Filled 2019-09-15 (×2): qty 1

## 2019-09-15 MED ORDER — POVIDONE-IODINE 10 % EX SWAB
2.0000 "application " | Freq: Once | CUTANEOUS | Status: AC
  Administered 2019-09-15: 2 via TOPICAL

## 2019-09-15 MED ORDER — TRANEXAMIC ACID-NACL 1000-0.7 MG/100ML-% IV SOLN
INTRAVENOUS | Status: AC
  Filled 2019-09-15: qty 100

## 2019-09-15 MED ORDER — PROPOFOL 10 MG/ML IV BOLUS
INTRAVENOUS | Status: DC | PRN
  Administered 2019-09-15: 30 mg via INTRAVENOUS

## 2019-09-15 MED ORDER — DEXAMETHASONE SODIUM PHOSPHATE 10 MG/ML IJ SOLN
INTRAMUSCULAR | Status: AC
  Filled 2019-09-15: qty 1

## 2019-09-15 MED ORDER — ONDANSETRON HCL 4 MG/2ML IJ SOLN
INTRAMUSCULAR | Status: AC
  Filled 2019-09-15: qty 2

## 2019-09-15 MED ORDER — MIDAZOLAM HCL 2 MG/2ML IJ SOLN
INTRAMUSCULAR | Status: DC | PRN
  Administered 2019-09-15: 2 mg via INTRAVENOUS

## 2019-09-15 MED ORDER — OXYCODONE HCL 5 MG PO TABS
5.0000 mg | ORAL_TABLET | ORAL | Status: DC | PRN
  Administered 2019-09-15 (×2): 10 mg via ORAL
  Filled 2019-09-15 (×2): qty 2

## 2019-09-15 MED ORDER — ONDANSETRON HCL 4 MG/2ML IJ SOLN
INTRAMUSCULAR | Status: DC | PRN
  Administered 2019-09-15: 4 mg via INTRAVENOUS

## 2019-09-15 MED ORDER — FENTANYL CITRATE (PF) 100 MCG/2ML IJ SOLN
25.0000 ug | INTRAMUSCULAR | Status: DC | PRN
  Administered 2019-09-15: 25 ug via INTRAVENOUS
  Administered 2019-09-15 (×2): 50 ug via INTRAVENOUS

## 2019-09-15 MED ORDER — METHOCARBAMOL 500 MG IVPB - SIMPLE MED
INTRAVENOUS | Status: AC
  Administered 2019-09-15: 500 mg via INTRAVENOUS
  Filled 2019-09-15: qty 50

## 2019-09-15 MED FILL — OXYCODONE-ACETAMINOPHEN 5-3: 5-325 | 5 days supply | Qty: 40 | Fill #0

## 2019-09-15 SURGICAL SUPPLY — 41 items
APL SKNCLS STERI-STRIP NONHPOA (GAUZE/BANDAGES/DRESSINGS)
BAG SPEC THK2 15X12 ZIP CLS (MISCELLANEOUS)
BAG ZIPLOCK 12X15 (MISCELLANEOUS) IMPLANT
BALL HIP CERAMIC (Hips) IMPLANT
BENZOIN TINCTURE PRP APPL 2/3 (GAUZE/BANDAGES/DRESSINGS) IMPLANT
BLADE SAW SGTL 18X1.27X75 (BLADE) ×2 IMPLANT
BLADE SURG SZ10 CARB STEEL (BLADE) ×4 IMPLANT
COLLAR OFFSET CORAIL SZ 10 HIP (Stem) IMPLANT
CORAIL OFFSET COLLAR SZ 10 HIP (Stem) ×2 IMPLANT
COVER PERINEAL POST (MISCELLANEOUS) ×2 IMPLANT
COVER SURGICAL LIGHT HANDLE (MISCELLANEOUS) ×2 IMPLANT
COVER WAND RF STERILE (DRAPES) IMPLANT
CUP GRIPTON 48MM 100 HIP (Hips) ×1 IMPLANT
DRAPE STERI IOBAN 125X83 (DRAPES) ×2 IMPLANT
DRAPE U-SHAPE 47X51 STRL (DRAPES) ×4 IMPLANT
DRSG AQUACEL AG ADV 3.5X 6 (GAUZE/BANDAGES/DRESSINGS) ×2 IMPLANT
DURAPREP 26ML APPLICATOR (WOUND CARE) ×2 IMPLANT
ELECT BLADE TIP CTD 4 INCH (ELECTRODE) ×2 IMPLANT
ELECT REM PT RETURN 15FT ADLT (MISCELLANEOUS) ×2 IMPLANT
ELIMINATOR HOLE APEX DEPUY (Hips) ×1 IMPLANT
GAUZE XEROFORM 1X8 LF (GAUZE/BANDAGES/DRESSINGS) IMPLANT
GLOVE BIOGEL PI IND STRL 8 (GLOVE) ×2 IMPLANT
GLOVE BIOGEL PI INDICATOR 8 (GLOVE) ×2
GLOVE ECLIPSE 7.5 STRL STRAW (GLOVE) ×4 IMPLANT
GOWN STRL REUS W/TWL XL LVL3 (GOWN DISPOSABLE) ×4 IMPLANT
HIP BALL CERAMIC (Hips) ×2 IMPLANT
HOLDER FOLEY CATH W/STRAP (MISCELLANEOUS) ×2 IMPLANT
HOOD PEEL AWAY FLYTE STAYCOOL (MISCELLANEOUS) ×4 IMPLANT
KIT TURNOVER KIT A (KITS) IMPLANT
NEEDLE HYPO 22GX1.5 SAFETY (NEEDLE) ×2 IMPLANT
PACK ANTERIOR HIP CUSTOM (KITS) ×2 IMPLANT
PINN ALTRX NEUT ID X OD 32X48 ×1 IMPLANT
STAPLER VISISTAT 35W (STAPLE) IMPLANT
STRIP CLOSURE SKIN 1/2X4 (GAUZE/BANDAGES/DRESSINGS) IMPLANT
SUT ETHIBOND NAB CT1 #1 30IN (SUTURE) ×4 IMPLANT
SUT MNCRL AB 3-0 PS2 18 (SUTURE) IMPLANT
SUT VIC AB 0 CT1 36 (SUTURE) ×2 IMPLANT
SUT VIC AB 1 CT1 36 (SUTURE) ×2 IMPLANT
TAPE STRIPS DRAPE STRL (GAUZE/BANDAGES/DRESSINGS) ×1 IMPLANT
TRAY FOLEY MTR SLVR 16FR STAT (SET/KITS/TRAYS/PACK) ×2 IMPLANT
YANKAUER SUCT BULB TIP 10FT TU (MISCELLANEOUS) ×2 IMPLANT

## 2019-09-15 NOTE — Discharge Instructions (Signed)

## 2019-09-15 NOTE — Op Note (Signed)
NAMETENEAL, SHRYOCK MEDICAL RECORD [id number] ACCOUNT [id number] DATE OF BIRTH:08-12-1966 FACILITY: [facility] LOCATION: [id number] PHYSICIAN:Suhaas Agena L. 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  PROCEDURE: 1.  Right total hip replacement with a [place] stem size 10 high offset, a 48 mm Gription cup, a +4 neutral liner, and a 32 mm delta ceramic hip ball +5. 2.  Interpretation of multiple intraoperative fluoroscopic images.  SURGEON:  Dorna Leitz, MD  ASSISTANT:  Gaspar Skeeters PA-C, was present for the entire case and assisted by retraction of tissues, manipulation of the leg, and closing to minimize OR time.    BRIEF HISTORY:  The patient is a 53 year old female with a long history of significant complaints of right hip pain.  She had x-rays showing severe bone-on-bone change.  She was having night pain and light activity pain.  After failure of all  conservative care, she was taken to the operating room for right total hip replacement.  DESCRIPTION OF PROCEDURE:  The patient was taken to the operating room after adequate anesthesia was obtained with general anesthetic.  The patient was placed supine on the operating table.  She was then moved onto the [place] bed.  All bony prominences  were well padded, and attention was then turned to the right hip where after routine prep and drape, an incision was made for an anterior approach to the hip, subcutaneous tissue down to the tensor fascia.  Tensor fascia was identified clearly.  The  fibers were finger dissected, and the retractors were put in place above and below the femoral neck.  Traction was put on the hip, and the capsule was opened and tagged.  Following this, retractors were put in place above and below the  neck, and a  provisional neck cut was made.  Attention was then turned towards the hip where retractors were put in place above and below the acetabulum, and a labrectomy was performed, followed by sequential reaming to a level of 49 mm, and a 50 mm Gription cup was  opened and placed.  Once this was done, a +4 neutral liner was placed.  Following this, attention was turned to the stem side.  It was externally rotated, extended, and adducted.  The canal was opened, followed by a chili pepper, followed by sequential  reaming up to a level of 10.  A 10 mm high-offset stem was placed, and we trialed it with a +5 ball.  She had a +9 on the other side and perfectly symmetric leg lengths.  At this time, stability checked and it was fine.  At this point, the hip was  dislocated, put back into position, and the final size 10 stem was opened and placed.  With a high offset, +5 ball was opened and placed and reduction was then taken.  Final images were taken at this point.  Excellent range of motion and stability were  achieved.  Attention was then turned towards the closure.  The capsule was closed with 1 Vicryl running.  The tensor fascia was then closed with 0 Vicryl running, skin with 0 and 2-0 Vicryl and 3-0 Monocryl subcuticular.  Benzoin and Steri-Strips were  applied.  A sterile compressive dressing was applied.  The patient was taken to recovery and was noted to be in satisfactory condition.  Estimated blood loss  for the procedure was 250 mL.  The final can be gotten from the anesthetic record.  LN/NUANCE  D:09/15/2019 T:09/15/2019 JOB:[id number]

## 2019-09-15 NOTE — Anesthesia Procedure Notes (Signed)
Procedure Name: MAC Date/Time: 09/15/2019 7:27 AM Performed by: Niel Hummer, CRNA Pre-anesthesia Checklist: Patient identified, Emergency Drugs available, Suction available and Patient being monitored Patient Re-evaluated:Patient Re-evaluated prior to induction Oxygen Delivery Method: Simple face mask

## 2019-09-15 NOTE — H&P (Signed)
TOTAL HIP ADMISSION H&P  Patient is admitted for right total hip arthroplasty.  Subjective:  Chief Complaint: right hip pain  HPI: Maria Simmons, 53 y.o. female, has a history of pain and functional disability in the right hip(s) due to arthritis and patient has failed non-surgical conservative treatments for greater than 12 weeks to include NSAID's and/or analgesics, corticosteriod injections, flexibility and strengthening excercises and activity modification.  Onset of symptoms was gradual starting 2 years ago with gradually worsening course since that time.The patient noted no past surgery on the right hip(s).  Patient currently rates pain in the right hip at 10 out of 10 with activity. Patient has night pain, worsening of pain with activity and weight bearing, trendelenberg gait, pain that interfers with activities of daily living, pain with passive range of motion and joint swelling. Patient has evidence of subchondral sclerosis, joint subluxation and joint space narrowing by imaging studies. This condition presents safety issues increasing the risk of falls. This patient has had Failure of all reasonable conservative care.  There is no current active infection.  Patient Active Problem List   Diagnosis Date Noted  . Other constipation 11/25/2018  . Rectal bleeding 10/22/2018  . Hypothyroidism 10/22/2018  . Hyperglycemia 10/22/2018  . Vaginal prolapse 05/03/2018  . Hoarseness of voice 04/18/2018  . Primary osteoarthritis of left hip 11/12/2017  . H/O laparoscopic adjustable gastric banding 09/24/2017  . B12 deficiency anemia 09/24/2017  . Iron deficiency anemia 09/24/2017  . Left hip pain 09/24/2017  . Esophagitis 06/25/2016  . Stye 06/25/2016  . Change in bowel habits 03/19/2016  . History of B-cell lymphoma 11/18/2015  . Right shoulder pain 01/07/2015  . Anemia in chronic illness 11/15/2014  . Chronic leukopenia 11/15/2014  . Neuropathy due to chemotherapeutic drug ([place])  11/15/2014  . History of lymphoma 10/19/2014  . 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  . Leucopenia 09/02/2012   Past Medical History:  Diagnosis Date  . Arthritis of shoulder region, right, degenerative    right shoulder   . Bladder prolapse, female, acquired   . Cancer [facility]) 2007   thyroid, papillary  . Change in bowel habits 03/19/2016  . Constipation   . DLBCL (diffuse large B cell lymphoma) ([place])   . History of B-cell lymphoma 11/18/2015  . Hypothyroidism   . Neutropenic fever ([place]) 11/01/2014  . PONV (postoperative nausea and vomiting)    during hysterectomy   . S/P radiation therapy 02/05/15-02/27/15   tonsil/neck,lt subpectoral nodal bed 30.6Gy /17 fx  . Thyroid cancer ([place])   . Wears glasses     Past Surgical History:  Procedure Laterality Date  . ABDOMINAL HYSTERECTOMY  2000  . COLONOSCOPY    . COLONOSCOPY WITH PROPOFOL N/A 06/19/2016   Procedure: COLONOSCOPY WITH PROPOFOL;  Surgeon: Ronald Lobo, MD;  Location: [place];  Service: Endoscopy;  Laterality: N/A;  . CYSTOCELE REPAIR N/A 05/03/2018   Procedure: ANTERIOR REPAIR (CYSTOCELE);  Surgeon: Bjorn Loser, MD;  Location[facility];  Service: Urology;  Laterality: N/A;  . CYSTOSCOPY N/A 05/03/2018   Procedure: CYSTOSCOPY;  Surgeon: Bjorn Loser, MD;  [facility];  Service: Urology;  Laterality: N/A;  . ESOPHAGOGASTRODUODENOSCOPY (EGD) WITH PROPOFOL N/A 06/19/2016   Procedure: ESOPHAGOGASTRODUODENOSCOPY (EGD) WITH PROPOFOL;  Surgeon: Ronald Lobo, MD;  Location: [facility] ENDOSCOPY;  Service: Endoscopy;  Laterality: N/A;  . LAPAROSCOPIC GASTRIC BANDING WITH HIATAL HERNIA REPAIR  05/28/2009  . LYMPH NODE BIOPSY Left 10/12/2014   Procedure: excisional biopsy deep left neck lymph node;  Surgeon: Fanny Skates, MD;  Location: [place];  Service: General;  Laterality: Left;  . PORT-A-CATH REMOVAL N/A 10/18/2015   Procedure: REMOVAL PORT-A-CATH;  Surgeon: Fanny Skates, MD;  [facility];   Service: General;  Laterality: N/A;  . PORTACATH PLACEMENT Right 10/24/2014   Procedure: INSERTION PORT-A-CATH WITH ULTRASOUND GUIDANCE;  Surgeon: Fanny Skates, MD;  Location: [place];  Service: General;  Laterality: Right;  . 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  [name] TOTAL HIP ARTHROPLASTY Left 11/12/2017   Procedure: LEFT TOTAL HIP ARTHROPLASTY ANTERIOR APPROACH;  Surgeon: Dorna Leitz, MD;  [facility];  Service: Orthopedics;  Laterality: Left;  . TUBAL LIGATION  1990  . VAGINAL PROLAPSE REPAIR N/A 05/03/2018   Procedure: VAULT PROLAPSE REPAIR WITH GRAFT;  Surgeon: Bjorn Loser, MD;  [facility];  Service: Urology;  Laterality: N/A;    Current Facility-Administered Medications  Medication Dose Route Frequency Provider Last Rate Last Dose  . bupivacaine liposome (EXPAREL) 1.3 % injection 266 mg  20 mL Other Once Dorna Leitz, MD      . chlorhexidine (HIBICLENS) 4 % liquid 4 application  60 mL Topical Once Dorna Leitz, MD      . lactated ringers infusion   Intravenous Continuous Oleta Mouse, MD 50 mL/hr at 09/15/19 0550    . tranexamic acid (CYKLOKAPRON) 1000MG /122mL IVPB           . tranexamic acid (CYKLOKAPRON) IVPB 1,000 mg  1,000 mg Intravenous To OR Dorna Leitz, MD       Allergies  Allergen Reactions  . Metformin And Related Rash    Fingers turned  Blue.  . Tape Itching    Surgical tape.  . Levaquin [Levofloxacin] Rash  . Penicillins Rash    Has patient had a PCN reaction causing immediate rash, facial/tongue/throat swelling, SOB or lightheadedness with hypotension: No Has patient had a PCN reaction causing severe rash involving mucus membranes or skin necrosis: No Has patient had a PCN reaction that required hospitalization No Has patient had a PCN reaction occurring within the last 10 years: No If all of the above answers are "NO", then may proceed with Cephalosporin use.      Social History   Tobacco Use  . Smoking status: Never Smoker  . Smokeless tobacco: Never Used  Substance Use Topics  . Alcohol use: No    Family History  Problem Relation Age of Onset  . Colon cancer Mother   . Cancer Mother        colon ca  . Cancer Father        prostate  . Hypertension Father   . Hyperlipidemia Father   . Stroke Father   . Prostate cancer Father   . Cancer Paternal Grandmother        female organ ca     ROS ROS: I have reviewed the patient's review of systems thoroughly and there are no positive responses as relates to the HPI. Objective:  Physical Exam  Vital signs in last 24 hours: Temp:  [98.8 F (37.1 C)] 98.8 F (37.1 C) (10/02 0529) Pulse Rate:  [83] 83 (10/02 0529) Resp:  [18] 18 (10/02 0529) BP: (119)/(69) 119/69 (10/02 0529) SpO2:  [100 %] 100 % (10/02 0529) Well-developed well-nourished patient in no acute distress. Alert and oriented x3 HEENT:within normal limits Cardiac: Regular rate and rhythm Pulmonary: Lungs clear to auscultation Abdomen: Soft and nontender.  Normal active bowel sounds  Musculoskeletal:: Range of motion.  Limited range of motion.  No instability.  Neurovascular intact distally.   Labs: Recent Results (from the past 2160 hour(s))  Glucose, capillary     Status: Abnormal   Collection Time: 09/08/19  9:00 AM  Result Value Ref Range   Glucose-Capillary 105 (H) 70 - 99 mg/dL  Type and screen Order type and screen if day of surgery is less than 15 days from draw of preadmission visit or order morning of surgery if day of surgery is greater than 6 days from preadmission visit.     Status: None   Collection Time: 09/08/19 10:02 AM  Result Value Ref Range   ABO/RH(D) B POS    Antibody Screen NEG    Sample Expiration 09/18/2019,2359    Extend sample reason      NO TRANSFUSIONS OR PREGNANCY IN THE PAST 3 MONTHS Performed at [facility], [place] [address], [place], [place] [address]   APTT      Status: None   Collection Time: 09/08/19 10:13 AM  Result Value Ref Range   aPTT 30 24 - 36 seconds    Comment: Performed at [facility], [place] [address] [place], [place] [address]  CBC WITH DIFFERENTIAL     Status: Abnormal   Collection Time: 09/08/19 10:13 AM  Result Value Ref Range   WBC 3.3 (L) 4.0 - 10.5 K/uL   RBC 4.41 3.87 - 5.11 MIL/uL   Hemoglobin 12.4 12.0 - 15.0 g/dL   HCT 38.9 36.0 - 46.0 %   MCV 88.2 80.0 - 100.0 fL   MCH 28.1 26.0 - 34.0 pg   MCHC 31.9 30.0 - 36.0 g/dL   RDW 13.8 11.5 - 15.5 %   Platelets 343 150 - 400 K/uL   nRBC 0.0 0.0 - 0.2 %   Neutrophils Relative % 47 %   Neutro Abs 1.6 (L) 1.7 - 7.7 K/uL   Lymphocytes Relative 41 %   Lymphs Abs 1.4 0.7 - 4.0 K/uL   Monocytes Relative 9 %   Monocytes Absolute 0.3 0.1 - 1.0 K/uL   Eosinophils Relative 2 %   Eosinophils Absolute 0.1 0.0 - 0.5 K/uL   Basophils Relative 1 %   Basophils Absolute 0.0 0.0 - 0.1 K/uL   Immature Granulocytes 0 %   Abs Immature Granulocytes 0.01 0.00 - 0.07 K/uL    Comment: Performed at [facility], [place] [address] [place], [place] [address]  Comprehensive metabolic panel     Status: Abnormal   Collection Time: 09/08/19 10:13 AM  Result Value Ref Range   Sodium 140 135 - 145 mmol/L   Potassium 4.0 3.5 - 5.1 mmol/L   Chloride 101 98 - 111 mmol/L   CO2 30 22 - 32 mmol/L   Glucose, Bld 112 (H) 70 - 99 mg/dL   BUN 13 6 - 20 mg/dL   Creatinine, Ser 1.03 (H) 0.44 - 1.00 mg/dL   Calcium 9.0 8.9 - 10.3 mg/dL   Total Protein 7.4 6.5 - 8.1 g/dL   Albumin 4.0 3.5 - 5.0 g/dL   AST 19 15 - 41 U/L   ALT 12 0 - 44 U/L   Alkaline Phosphatase 61 38 - 126 U/L   Total Bilirubin 0.6 0.3 - 1.2 mg/dL   GFR calc non Af Amer >60 >60 mL/min   GFR calc Af Amer >60 >60 mL/min   Anion gap 9 5 - 15    Comment: Performed at [facility], [place] [address], [place], [place] [address]  Protime-INR     Status: None   Collection Time: 09/08/19 10:13  AM  Result Value Ref Range   Prothrombin Time 13.6 11.4 - 15.2 seconds   INR 1.1 0.8 - 1.2    Comment: (NOTE) INR goal varies based on device and disease states. Performed at [facility], [place] [address], [place], [place] [address]   Urinalysis, Routine w reflex microscopic     Status: Abnormal   Collection Time: 09/08/19 10:13 AM  Result Value Ref Range   Color, Urine YELLOW YELLOW   APPearance HAZY (A) CLEAR   Specific Gravity, Urine 1.023 1.005 - 1.030   pH 5.0 5.0 - 8.0   Glucose, UA NEGATIVE NEGATIVE mg/dL   Hgb urine dipstick SMALL (A) NEGATIVE   Bilirubin Urine NEGATIVE NEGATIVE   Ketones, ur NEGATIVE NEGATIVE mg/dL   Protein, ur NEGATIVE NEGATIVE mg/dL   Nitrite NEGATIVE NEGATIVE   Leukocytes,Ua SMALL (A) NEGATIVE   RBC / HPF 0-5 0 - 5 RBC/hpf   WBC, UA 21-50 0 - 5 WBC/hpf   Bacteria, UA RARE (A) NONE SEEN   Squamous Epithelial / LPF 0-5 0 - 5   Mucus PRESENT    Hyaline Casts, UA PRESENT     Comment: Performed at [facility], [place] [address], [place], [place] [address]  Surgical pcr screen     Status: None   Collection Time: 09/08/19 10:13 AM   Specimen: Nasal Mucosa; Nasal Swab  Result Value Ref Range   MRSA, PCR NEGATIVE NEGATIVE   Staphylococcus aureus NEGATIVE NEGATIVE    Comment: (NOTE) The Xpert SA Assay (FDA approved for NASAL specimens in patients 14 years of age and older), is one component of a comprehensive surveillance program. It is not intended to diagnose infection nor to guide or monitor treatment. Performed at [facility], [place] [address] [place], [place] [address]   Hemoglobin A1c     Status: Abnormal   Collection Time: 09/08/19 10:13 AM  Result Value Ref Range   Hgb A1c MFr Bld 5.9 (H) 4.8 - 5.6 %    Comment: (NOTE) Pre diabetes:          5.7%-6.4% Diabetes:              >6.4% Glycemic control for   <7.0% adults with diabetes    Mean Plasma Glucose 122.63 mg/dL    Comment:  Performed at [place] [address], [place], [place] [address]  Novel Coronavirus, NAA (Hosp order, Send-out to Ref Lab; TAT 18-24 hrs     Status: None   Collection Time: 09/12/19  2:40 PM   Specimen: Nasopharyngeal Swab; Respiratory  Result Value Ref Range   SARS-CoV-2, NAA NOT DETECTED NOT DETECTED    Comment: (NOTE) This nucleic acid amplification test was developed and its performance characteristics determined by [employer]. Nucleic acid amplification tests include PCR and TMA. This test has not been FDA cleared or approved. This test has been authorized by FDA under an Emergency Use Authorization (EUA). This test is only authorized for the duration of time the declaration that circumstances exist justifying the authorization of the emergency use of in vitro diagnostic tests for detection of SARS-CoV-2 virus and/or diagnosis of COVID-19 infection under section 564(b)(1) of the Act, 21 U.S.C. [id number]) (1), unless the authorization is terminated or revoked sooner. When diagnostic testing is negative, the possibility of a false negative result should  be considered in the context of a patient's recent exposures and the presence of clinical signs and symptoms consistent with COVID-19. An individual without symptoms of COVID- 19 and who is not shedding SARS-CoV-2 vi rus would expect to have a negative (not detected) result in this assay. Performed At: [facility] [place], [place] [id number] Rush Farmer MD [id number]    Coronavirus Source NASOPHARYNGEAL     Comment: Performed at [facility], [place] [address] [place], [place] [address]    Estimated body mass index is 28.46 kg/m as calculated from the following:   Height as of 09/08/19: 5\' 5"  (1.651 m).   Weight as of 09/08/19: 77.6 kg.   Imaging Review Plain radiographs demonstrate severe degenerative joint disease of the right hip(s). The bone quality appears to be good  for age and reported activity level.      Assessment/Plan:  End stage arthritis, right hip(s)  The patient history, physical examination, clinical judgement of the provider and imaging studies are consistent with end stage degenerative joint disease of the right hip(s) and total hip arthroplasty is deemed medically necessary. The treatment options including medical management, injection therapy, arthroscopy and arthroplasty were discussed at length. The risks and benefits of total hip arthroplasty were presented and reviewed. The risks due to aseptic loosening, infection, stiffness, dislocation/subluxation,  thromboembolic complications and other imponderables were discussed.  The patient acknowledged the explanation, agreed to proceed with the plan and consent was signed. Patient is being admitted for inpatient treatment for surgery, pain control, PT, OT, prophylactic antibiotics, VTE prophylaxis, progressive ambulation and ADL's and discharge planning.The patient is planning to be discharged home with home health services

## 2019-09-15 NOTE — Transfer of Care (Signed)
Immediate Anesthesia Transfer of Care Note  Patient: Maria Simmons  Procedure(s) Performed: TOTAL HIP ARTHROPLASTY ANTERIOR APPROACH (Right Hip)  Patient Location: PACU  Anesthesia Type:Spinal  Level of Consciousness: awake, alert  and oriented  Airway & Oxygen Therapy: Patient Spontanous Breathing and Patient connected to face mask oxygen  Post-op Assessment: Report given to RN and Post -op Vital signs reviewed and stable  Post vital signs: Reviewed and stable  Last Vitals:  Vitals Value Taken Time  BP    Temp    Pulse 90 09/15/19 0919  Resp 14 09/15/19 0919  SpO2 100 % 09/15/19 0919  Vitals shown include unvalidated device data.  Last Pain:  Vitals:   09/15/19 0529  TempSrc: Oral         Complications: No apparent anesthesia complications

## 2019-09-15 NOTE — Anesthesia Postprocedure Evaluation (Signed)
Anesthesia Post Note  Patient: Maria Simmons  Procedure(s) Performed: TOTAL HIP ARTHROPLASTY ANTERIOR APPROACH (Right Hip)     Patient location during evaluation: PACU Anesthesia Type: Spinal Level of consciousness: awake and alert Pain management: pain level controlled Vital Signs Assessment: post-procedure vital signs reviewed and stable Respiratory status: spontaneous breathing and respiratory function stable Cardiovascular status: blood pressure returned to baseline and stable Postop Assessment: no headache, no backache, spinal receding and no apparent nausea or vomiting Anesthetic complications: no    Last Vitals:  Vitals:   09/15/19 1119 09/15/19 1224  BP: 140/77 135/86  Pulse: 69 63  Resp: 16 17  Temp: 36.5 C (!) 36.4 C  SpO2: 100% 100%    Last Pain:  Vitals:   09/15/19 1224  TempSrc: Oral  PainSc:                  Montez Hageman

## 2019-09-15 NOTE — Brief Op Note (Signed)
09/15/2019  9:25 AM  PATIENT:  Maria Simmons  53 y.o. female  PRE-OPERATIVE DIAGNOSIS:  DEGENERATIVE JOINT DISEASE RIGHT HIP  POST-OPERATIVE DIAGNOSIS:  degenerative joint disease right hip  PROCEDURE:  Procedure(s): TOTAL HIP ARTHROPLASTY ANTERIOR APPROACH (Right)  SURGEON:  Surgeon(s) and Role:    Dorna Leitz, MD - Primary  PHYSICIAN ASSISTANT:   ASSISTANTS: bethune   ANESTHESIA:   spinal  EBL:  250 mL   BLOOD ADMINISTERED:none  DRAINS: none   LOCAL MEDICATIONS USED:  MARCAINE    and OTHER experel  SPECIMEN:  No Specimen  DISPOSITION OF SPECIMEN:  N/A  COUNTS:  YES  TOURNIQUET:  * No tourniquets in log *  DICTATION: .Other Dictation: Dictation Number [id number]  PLAN OF CARE: Admit for overnight observation  PATIENT DISPOSITION:  PACU - hemodynamically stable.   Delay start of Pharmacological VTE agent (>24hrs) due to surgical blood loss or risk of bleeding: no

## 2019-09-15 NOTE — Anesthesia Preprocedure Evaluation (Signed)
Anesthesia Evaluation  Patient identified by MRN, date of birth, ID band Patient awake    Reviewed: Allergy & Precautions, NPO status , Patient's Chart, lab work & pertinent test results  History of Anesthesia Complications (+) PONV  Airway Mallampati: II  TM Distance: >3 FB Neck ROM: Full    Dental no notable dental hx.    Pulmonary neg pulmonary ROS,    Pulmonary exam normal breath sounds clear to auscultation       Cardiovascular negative cardio ROS Normal cardiovascular exam Rhythm:Regular Rate:Normal     Neuro/Psych negative neurological ROS  negative psych ROS   GI/Hepatic negative GI ROS, Neg liver ROS,   Endo/Other  negative endocrine ROS  Renal/GU negative Renal ROS  negative genitourinary   Musculoskeletal negative musculoskeletal ROS (+)   Abdominal   Peds negative pediatric ROS (+)  Hematology negative hematology ROS (+)   Anesthesia Other Findings   Reproductive/Obstetrics negative OB ROS                             Anesthesia Physical Anesthesia Plan  ASA: II  Anesthesia Plan: Spinal   Post-op Pain Management:    Induction:   PONV Risk Score and Plan: 3 and Ondansetron and Treatment may vary due to age or medical condition  Airway Management Planned: Simple Face Mask  Additional Equipment:   Intra-op Plan:   Post-operative Plan:   Informed Consent: I have reviewed the patients History and Physical, chart, labs and discussed the procedure including the risks, benefits and alternatives for the proposed anesthesia with the patient or authorized representative who has indicated his/her understanding and acceptance.     Dental advisory given  Plan Discussed with:   Anesthesia Plan Comments:         Anesthesia Quick Evaluation

## 2019-09-15 NOTE — Evaluation (Signed)
Physical Therapy Evaluation Patient Details Name: Maria Simmons MRN: [id number] DOB: 1966-07-27 Today's Date: 09/15/2019   History of Present Illness  s/p R DA THA. PMH: L THA, gastric banding, lymphoma  Clinical Impression  Pt is s/p THA resulting in the deficits listed below (see PT Problem List).  Pt amb ~ 48' with RW and min/guard, anticipate steady progress. Will  See in am. Pt is hopeful to d/c Saturday  Pt will benefit from skilled PT to increase their independence and safety with mobility to allow discharge to the venue listed below.     Follow Up Recommendations Follow surgeon's recommendation for DC plan and follow-up therapies    Equipment Recommendations  None recommended by PT    Recommendations for Other Services       Precautions / Restrictions Precautions Precautions: Fall Restrictions Weight Bearing Restrictions: No Other Position/Activity Restrictions: WBAT      Mobility  Bed Mobility Overal bed mobility: Needs Assistance Bed Mobility: Supine to Sit;Sit to Supine     Supine to sit: Min guard Sit to supine: Min guard   General bed mobility comments: for safety  Transfers Overall transfer level: Needs assistance Equipment used: Rolling walker (2 wheeled) Transfers: Sit to/from Stand Sit to Stand: Min guard         General transfer comment: cues for hand placement and safety  Ambulation/Gait Ambulation/Gait assistance: Min guard Gait Distance (Feet): 80 Feet Assistive device: Rolling walker (2 wheeled) Gait Pattern/deviations: Step-to pattern;Decreased weight shift to right     General Gait Details: cues for sequence and RW position  Stairs            Wheelchair Mobility    Modified Rankin (Stroke Patients Only)       Balance                                             Pertinent Vitals/Pain Pain Assessment: 0-10 Pain Score: 3  Pain Location: right hip Pain Descriptors / Indicators: Sore Pain  Intervention(s): Limited activity within patient's tolerance;Monitored during session;Ice applied;Repositioned    Home Living Family/patient expects to be discharged to:: Private residence Living Arrangements: Alone Available Help at Discharge: Family;Available 24 hours/day Type of Home: House Home Access: Level entry     Home Layout: One level Home Equipment: Walker - 4 wheels;Walker - 2 wheels;Cane - single point      Prior Function Level of Independence: Independent;Independent with assistive device(s)         Comments: amb cane prior to surgery     Hand Dominance        Extremity/Trunk Assessment   Upper Extremity Assessment Upper Extremity Assessment: Overall WFL for tasks assessed    Lower Extremity Assessment Lower Extremity Assessment: RLE deficits/detail RLE Deficits / Details: ankle WFL. knee and hip grossly 3/5       Communication   Communication: No difficulties  Cognition Arousal/Alertness: Awake/alert Behavior During Therapy: WFL for tasks assessed/performed Overall Cognitive Status: Within Functional Limits for tasks assessed                                        General Comments      Exercises Total Joint Exercises Ankle Circles/Pumps: AROM;Both;10 reps Quad Sets: 5 reps;Both;AROM   Assessment/Plan    PT  Assessment Patient needs continued PT services  PT Problem List Decreased strength;Decreased activity tolerance;Decreased mobility;Decreased knowledge of use of DME;Pain       PT Treatment Interventions DME instruction;Gait training;Functional mobility training;Therapeutic activities;Therapeutic exercise;Patient/family education    PT Goals (Current goals can be found in the Care Plan section)  Acute Rehab PT Goals PT Goal Formulation: With patient Time For Goal Achievement: 09/22/19 Potential to Achieve Goals: Good    Frequency 7X/week   Barriers to discharge        Co-evaluation                AM-PAC PT "6 Clicks" Mobility  Outcome Measure Help needed turning from your back to your side while in a flat bed without using bedrails?: A Little Help needed moving from lying on your back to sitting on the side of a flat bed without using bedrails?: A Little Help needed moving to and from a bed to a chair (including a wheelchair)?: A Little Help needed standing up from a chair using your arms (e.g., wheelchair or bedside chair)?: A Little Help needed to walk in hospital room?: A Little Help needed climbing 3-5 steps with a railing? : A Little 6 Click Score: 18    End of Session Equipment Utilized During Treatment: Gait belt Activity Tolerance: Patient tolerated treatment well Patient left: in bed;with call bell/phone within reach;with bed alarm set   PT Visit Diagnosis: Difficulty in walking, not elsewhere classified (R26.2)    Time: [id number] PT Time Calculation (min) (ACUTE ONLY): 27 min   Charges:   PT Evaluation $PT Eval Low Complexity: 1 Low PT Treatments $Gait Training: 8-22 mins        Kenyon Ana, PT  Pager: [phone] Acute Rehab Dept [facility]): [id number]   09/15/2019   [facility] 09/15/2019, 3:39 PM

## 2019-09-15 NOTE — Anesthesia Procedure Notes (Signed)
Spinal  Patient location during procedure: OR Start time: 09/15/2019 7:28 AM End time: 09/15/2019 7:32 AM Staffing Resident/CRNA: Niel Hummer, CRNA Performed: resident/CRNA  Preanesthetic Checklist Completed: patient identified, surgical consent, pre-op evaluation, IV checked, risks and benefits discussed and monitors and equipment checked Spinal Block Patient position: sitting Prep: DuraPrep Patient monitoring: heart rate, continuous pulse ox and blood pressure Approach: midline Location: L3-4 Injection technique: single-shot Needle Needle type: Pencan  Needle gauge: 24 G

## 2019-09-16 DIAGNOSIS — M1611 Unilateral primary osteoarthritis, right hip: Secondary | ICD-10-CM | POA: Diagnosis not present

## 2019-09-16 LAB — CBC
HCT: 32 % — ABNORMAL LOW (ref 36.0–46.0)
Hemoglobin: 10 g/dL — ABNORMAL LOW (ref 12.0–15.0)
MCH: 28.1 pg (ref 26.0–34.0)
MCHC: 31.3 g/dL (ref 30.0–36.0)
MCV: 89.9 fL (ref 80.0–100.0)
Platelets: 289 10*3/uL (ref 150–400)
RBC: 3.56 MIL/uL — ABNORMAL LOW (ref 3.87–5.11)
RDW: 14 % (ref 11.5–15.5)
WBC: 9.3 10*3/uL (ref 4.0–10.5)
nRBC: 0 % (ref 0.0–0.2)

## 2019-09-16 MED FILL — tiZANidine HCL 2 MG TABS: 2 | 14 days supply | Qty: 40 | Fill #0

## 2019-09-16 NOTE — Progress Notes (Signed)
    Home health agencies that serve [phone].        [place] Quality of Patient Care Rating Patient Survey Summary Rating  [facility] [phone] 4 out of 5 stars 4 out of [place] [phone] 4  out of 5 stars 3 out of [place] [phone]) [phone] 4 out of 5 stars 4 out of 5 stars  [place] [phone] 4 out of 5 stars 4 out of [place] A[facility] [phone] 3 out of 5 stars 3 out of 5 stars  [facility] [phone] 3  out of 5 stars 4 out of [place] [phone] 3 out of 5 stars 4 out of [place] number Footnote as displayed on [place]  1 This agency provides services under a federal waiver program to non-traditional, chronic long term population.  2 This agency provides services to a special needs population.  3 Not Available.  4 The number of patient episodes for this measure is too small to report.  5 This measure currently does not have data or provider has been certified/recertified for less than 6 months.  6 The national average for this measure is not provided because of state-to-state differences in data collection.  7 Medicare is not displaying rates for this measure for any home health agency, because of an issue with the data.  8 There were problems with the data and they are being corrected.  9 Zero, or very few, patients met the survey's rules for inclusion. The scores shown, if any, reflect a very small number of surveys and may not accurately tell how an agency is doing.  10 Survey results are based on less than 12 months of data.  11 Fewer than 70 patients completed the survey. Use the scores shown, if any, with caution as the number of surveys may be too low to accurately  tell how an agency is doing.  12 No survey results are available for this period.  13 Data suppressed by CMS for one or more quarters.

## 2019-09-16 NOTE — TOC Initial Note (Signed)
Transition of Care [facility]) - Initial/Assessment Note    Patient Details  Name: Maria Simmons MRN: [id number] Date of Birth: Jul 30, 1966  Transition of Care (TOC) CM/SW Contact:    Joaquin Courts, RN Phone Number: 09/16/2019, 11:27 AM  Clinical Narrative:  CM spoke with patient at bedside. Patient set up with [facility] at home for [place], reports going to stay at a different address than listed in chart [address], [place] [address].)  Patient reports has rolling walker and 3-in-1 at home.                  Expected Discharge Plan: [place] Barriers to Discharge: No Barriers Identified   Patient Goals and CMS Choice Patient states their goals for this hospitalization and ongoing recovery are:: to go home with therapy CMS Medicare.gov Compare Post Acute Care list provided to:: Patient Choice offered to / list presented to : Patient  Expected Discharge Plan and Services Expected Discharge Plan: [place]   Discharge Planning Services: CM Consult Post Acute Care Choice: [place] arrangements for the past 2 months: Single Family Home Expected Discharge Date: 09/16/19               DME Arranged: N/A DME Agency: NA       HH Arranged: PT HH Agency: [facility] (formerly [employer]) Date [place]: 09/16/19 Time [place]: 1124 Representative spoke with at [place]: pre arranged in MD office  Prior Living Arrangements/Services Living arrangements for the past 2 months: [place] with:: Self Patient language and need for interpreter reviewed:: Yes Do you feel safe going back to the place where you live?: Yes      Need for Family Participation in Patient Care: Yes (Comment) Care giver support system in place?: Yes (comment)   Criminal Activity/Legal Involvement Pertinent to Current Situation/Hospitalization: No - Comment as needed  Activities of Daily Living Home Assistive  Devices/Equipment: Eyeglasses, Gilford Rile (specify type) ADL Screening (condition at time of admission) Patient's cognitive ability adequate to safely complete daily activities?: Yes Is the patient deaf or have difficulty hearing?: No Does the patient have difficulty seeing, even when wearing glasses/contacts?: No Does the patient have difficulty concentrating, remembering, or making decisions?: No Patient able to express need for assistance with ADLs?: Yes Does the patient have difficulty dressing or bathing?: No Independently performs ADLs?: Yes (appropriate for developmental age) Does the patient have difficulty walking or climbing stairs?: Yes Weakness of Legs: Right Weakness of Arms/Hands: None  Permission Sought/Granted   Permission granted to share information with : Yes, Verbal Permission Granted     Permission granted to share info w AGENCY: [facility]        Emotional Assessment Appearance:: Appears stated age Attitude/Demeanor/Rapport: Engaged Affect (typically observed): Accepting Orientation: : Oriented to Place, Oriented to  Time, Oriented to Situation, Oriented to Self   Psych Involvement: No (comment)  Admission diagnosis:  Primary osteoarthritis of right hip [M16.11] Patient Active Problem List   Diagnosis Date Noted  . Primary osteoarthritis of right hip 09/15/2019  . Other constipation 11/25/2018  . Rectal bleeding 10/22/2018  . Hypothyroidism 10/22/2018  . Hyperglycemia 10/22/2018  . Vaginal prolapse 05/03/2018  . Hoarseness of voice 04/18/2018  . Primary osteoarthritis of left hip 11/12/2017  . H/O laparoscopic adjustable gastric banding 09/24/2017  . B12 deficiency anemia 09/24/2017  . Iron deficiency anemia 09/24/2017  . Left hip pain 09/24/2017  .  Esophagitis 06/25/2016  . Stye 06/25/2016  . Change in bowel habits 03/19/2016  . History of B-cell lymphoma 11/18/2015  . Right shoulder pain 01/07/2015  . Anemia in chronic illness 11/15/2014  . Chronic  leukopenia 11/15/2014  . Neuropathy due to chemotherapeutic drug ([place]) 11/15/2014  . History of lymphoma 10/19/2014  . 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  . Leucopenia 09/02/2012   PCP:  Jacelyn Pi, MD Pharmacy:   [facility], [place]. [address] [place] [address] Phone: [phone] Fax: [phone]     Social Determinants of Health (SDOH) Interventions    Readmission Risk Interventions No flowsheet data found.

## 2019-09-16 NOTE — Progress Notes (Signed)
    Patient doing well with minimal pain. She has been up and ambulating. Eating NL, NL B/B function + passing gas, eager to progress home. Denies N/V/D/Fatigue/SOB   Physical Exam: Vitals:   09/16/19 0123 09/16/19 0415  BP: 106/64 132/77  Pulse: 78 78  Resp: 16 16  Temp: 98.4 F (36.9 C) 99.4 F (37.4 C)  SpO2: 98% 100%   CBC Latest Ref Rng & Units 09/16/2019 09/08/2019 11/25/2018  WBC 4.0 - 10.5 K/uL 9.3 3.3(L) 3.2(L)  Hemoglobin 12.0 - 15.0 g/dL 10.0(L) 12.4 12.1  Hematocrit 36.0 - 46.0 % 32.0(L) 38.9 37.6  Platelets 150 - 400 K/uL 289 343 272   Dressing in place, distal compartments soft, SCDs in place, pt resting comfortably  NVI  POD #1 s/p R Ant THR per Dr Berenice Primas, doing excellent   - up with PT/OT, encourage ambulation - Cont current px regimen  - ASA x 1 mo for DVT prophylaxis  - d/c home today post PT with f/u in 2 weeks

## 2019-09-16 NOTE — Progress Notes (Signed)
Physical Therapy Treatment Patient Details Name: Maria Simmons MRN: [id number] DOB: 06-11-1966 Today's Date: 09/16/2019    History of Present Illness s/p R DA THA. PMH: L THA, gastric banding, lymphoma    PT Comments    Pt progressing very well. Ready for d/c from PT standpoint   Follow Up Recommendations  Follow surgeon's recommendation for DC plan and follow-up therapies     Equipment Recommendations  None recommended by PT    Recommendations for Other Services       Precautions / Restrictions Precautions Precautions: Fall Restrictions Weight Bearing Restrictions: No Other Position/Activity Restrictions: WBAT    Mobility  Bed Mobility Overal bed mobility: Modified Independent                Transfers Overall transfer level: Needs assistance Equipment used: Rolling walker (2 wheeled) Transfers: Sit to/from Stand Sit to Stand: Supervision         General transfer comment: cues for hand placement and safety  Ambulation/Gait Ambulation/Gait assistance: Supervision Gait Distance (Feet): 180 Feet Assistive device: Rolling walker (2 wheeled) Gait Pattern/deviations: Step-to pattern;Decreased weight shift to right;Step-through pattern     General Gait Details: cues for sequence and RW position   Stairs             Wheelchair Mobility    Modified Rankin (Stroke Patients Only)       Balance                                            Cognition Arousal/Alertness: Awake/alert Behavior During Therapy: WFL for tasks assessed/performed Overall Cognitive Status: Within Functional Limits for tasks assessed                                        Exercises Total Joint Exercises Ankle Circles/Pumps: AROM;Both;10 reps Quad Sets: AROM;Both;10 reps Heel Slides: AROM;AAROM;Right;10 reps(using gait belt to assist) Hip ABduction/ADduction: AROM;AAROM;Right;10 reps Long Arc Quad:  AROM;Strengthening;Right;Seated    General Comments        Pertinent Vitals/Pain Pain Assessment: 0-10 Pain Score: 3  Pain Location: right hip Pain Descriptors / Indicators: Sore Pain Intervention(s): Limited activity within patient's tolerance;Monitored during session;Premedicated before session;Repositioned;Ice applied    Home Living                      Prior Function            PT Goals (current goals can now be found in the care plan section) Acute Rehab PT Goals PT Goal Formulation: With patient Time For Goal Achievement: 09/22/19 Potential to Achieve Goals: Good Progress towards PT goals: Progressing toward goals    Frequency    7X/week      PT Plan Current plan remains appropriate    Co-evaluation              AM-PAC PT "6 Clicks" Mobility   Outcome Measure  Help needed turning from your back to your side while in a flat bed without using bedrails?: None Help needed moving from lying on your back to sitting on the side of a flat bed without using bedrails?: None Help needed moving to and from a bed to a chair (including a wheelchair)?: A Little Help needed standing up from a chair using your arms (  e.g., wheelchair or bedside chair)?: A Little Help needed to walk in hospital room?: A Little Help needed climbing 3-5 steps with a railing? : A Little 6 Click Score: 20    End of Session Equipment Utilized During Treatment: Gait belt Activity Tolerance: Patient tolerated treatment well Patient left: in chair;with call bell/phone within reach;with chair alarm set   PT Visit Diagnosis: Difficulty in walking, not elsewhere classified (R26.2)     Time: [id number] PT Time Calculation (min) (ACUTE ONLY): 16 min  Charges:  $Gait Training: 8-22 mins                     Kenyon Ana, PT  Pager: [phone] Acute Rehab Dept [facility]): [phone]   09/16/2019    [facility] 09/16/2019, 10:03 AM

## 2019-09-16 NOTE — Plan of Care (Signed)
Discharged home in stable condition.

## 2019-09-18 ENCOUNTER — Encounter (HOSPITAL_COMMUNITY): Payer: Self-pay | Admitting: Orthopedic Surgery

## 2019-09-19 DIAGNOSIS — D519 Vitamin B12 deficiency anemia, unspecified: Secondary | ICD-10-CM | POA: Diagnosis not present

## 2019-09-19 DIAGNOSIS — N8110 Cystocele, unspecified: Secondary | ICD-10-CM | POA: Diagnosis not present

## 2019-09-19 DIAGNOSIS — M19011 Primary osteoarthritis, right shoulder: Secondary | ICD-10-CM | POA: Diagnosis not present

## 2019-09-19 DIAGNOSIS — K209 Esophagitis, unspecified without bleeding: Secondary | ICD-10-CM | POA: Diagnosis not present

## 2019-09-19 DIAGNOSIS — D509 Iron deficiency anemia, unspecified: Secondary | ICD-10-CM | POA: Diagnosis not present

## 2019-09-19 DIAGNOSIS — K5909 Other constipation: Secondary | ICD-10-CM | POA: Diagnosis not present

## 2019-09-19 DIAGNOSIS — E039 Hypothyroidism, unspecified: Secondary | ICD-10-CM | POA: Diagnosis not present

## 2019-09-19 DIAGNOSIS — G620 Drug-induced polyneuropathy: Secondary | ICD-10-CM | POA: Diagnosis not present

## 2019-09-19 DIAGNOSIS — Z471 Aftercare following joint replacement surgery: Secondary | ICD-10-CM | POA: Diagnosis not present

## 2019-09-25 DIAGNOSIS — Z471 Aftercare following joint replacement surgery: Secondary | ICD-10-CM | POA: Diagnosis not present

## 2019-09-25 DIAGNOSIS — M1611 Unilateral primary osteoarthritis, right hip: Secondary | ICD-10-CM | POA: Diagnosis not present

## 2019-11-14 ENCOUNTER — Telehealth: Payer: Self-pay | Admitting: Hematology and Oncology

## 2019-11-14 NOTE — Telephone Encounter (Signed)
Returned patient's phone call regarding rescheduling 12/14 appointment, per patient's request appointment has moved to 01/04.

## 2019-11-27 ENCOUNTER — Ambulatory Visit: Payer: 59 | Admitting: Hematology and Oncology

## 2019-11-27 ENCOUNTER — Other Ambulatory Visit: Payer: 59

## 2019-12-14 ENCOUNTER — Other Ambulatory Visit: Payer: Self-pay | Admitting: Hematology and Oncology

## 2019-12-14 ENCOUNTER — Telehealth: Payer: Self-pay | Admitting: Hematology and Oncology

## 2019-12-14 DIAGNOSIS — Z8572 Personal history of non-Hodgkin lymphomas: Secondary | ICD-10-CM

## 2019-12-14 DIAGNOSIS — Z8579 Personal history of other malignant neoplasms of lymphoid, hematopoietic and related tissues: Secondary | ICD-10-CM

## 2019-12-14 NOTE — Telephone Encounter (Signed)
Returned patient's phone call regarding cancelling January appointments, per patient's request appointment cancelled.

## 2019-12-18 ENCOUNTER — Inpatient Hospital Stay: Payer: 59 | Admitting: Hematology and Oncology

## 2019-12-18 ENCOUNTER — Inpatient Hospital Stay: Payer: 59

## 2019-12-27 MED FILL — SYNTHROID 137 MCG TABLET: 137 | 90 days supply | Qty: 90 | Fill #1

## 2020-01-05 DIAGNOSIS — H524 Presbyopia: Secondary | ICD-10-CM | POA: Diagnosis not present

## 2020-04-08 MED FILL — SYNTHROID 137 MCG TABLET: 137 | 90 days supply | Qty: 90 | Fill #2

## 2020-05-14 DIAGNOSIS — C73 Malignant neoplasm of thyroid gland: Secondary | ICD-10-CM | POA: Diagnosis not present

## 2020-05-14 DIAGNOSIS — E7800 Pure hypercholesterolemia, unspecified: Secondary | ICD-10-CM | POA: Diagnosis not present

## 2020-05-14 DIAGNOSIS — E890 Postprocedural hypothyroidism: Secondary | ICD-10-CM | POA: Diagnosis not present

## 2020-05-14 DIAGNOSIS — E1165 Type 2 diabetes mellitus with hyperglycemia: Secondary | ICD-10-CM | POA: Diagnosis not present

## 2020-05-14 DIAGNOSIS — E559 Vitamin D deficiency, unspecified: Secondary | ICD-10-CM | POA: Diagnosis not present

## 2020-05-28 DIAGNOSIS — E890 Postprocedural hypothyroidism: Secondary | ICD-10-CM | POA: Diagnosis not present

## 2020-05-28 DIAGNOSIS — E7800 Pure hypercholesterolemia, unspecified: Secondary | ICD-10-CM | POA: Diagnosis not present

## 2020-05-28 DIAGNOSIS — I10 Essential (primary) hypertension: Secondary | ICD-10-CM | POA: Diagnosis not present

## 2020-05-28 DIAGNOSIS — E559 Vitamin D deficiency, unspecified: Secondary | ICD-10-CM | POA: Diagnosis not present

## 2020-05-28 DIAGNOSIS — C73 Malignant neoplasm of thyroid gland: Secondary | ICD-10-CM | POA: Diagnosis not present

## 2020-05-28 DIAGNOSIS — E1165 Type 2 diabetes mellitus with hyperglycemia: Secondary | ICD-10-CM | POA: Diagnosis not present

## 2020-05-28 MED FILL — SYNTHROID 125 MCG TABLET: 125 | 30 days supply | Qty: 30 | Fill #0

## 2020-07-30 DIAGNOSIS — E890 Postprocedural hypothyroidism: Secondary | ICD-10-CM | POA: Diagnosis not present

## 2020-07-31 ENCOUNTER — Other Ambulatory Visit (HOSPITAL_COMMUNITY): Payer: Self-pay | Admitting: Endocrinology

## 2020-07-31 MED FILL — SYNTHROID 125 MCG TABLET: 125 | 30 days supply | Qty: 30 | Fill #0

## 2020-09-26 DIAGNOSIS — Z20822 Contact with and (suspected) exposure to covid-19: Secondary | ICD-10-CM | POA: Diagnosis not present

## 2020-10-10 ENCOUNTER — Telehealth: Payer: Self-pay | Admitting: *Deleted

## 2020-10-10 ENCOUNTER — Other Ambulatory Visit: Payer: Self-pay | Admitting: Obstetrics and Gynecology

## 2020-10-10 DIAGNOSIS — Z01419 Encounter for gynecological examination (general) (routine) without abnormal findings: Secondary | ICD-10-CM | POA: Diagnosis not present

## 2020-10-10 DIAGNOSIS — Z1231 Encounter for screening mammogram for malignant neoplasm of breast: Secondary | ICD-10-CM

## 2020-10-10 DIAGNOSIS — Z90710 Acquired absence of both cervix and uterus: Secondary | ICD-10-CM | POA: Diagnosis not present

## 2020-10-10 NOTE — Telephone Encounter (Signed)
Received vm call from pt requesting to r/s a cancelled appt with Dr Alvy Bimler from back in January.  Left message for pt that message will be sent to scheduler for routine f/u.

## 2020-10-11 ENCOUNTER — Telehealth: Payer: Self-pay | Admitting: Hematology and Oncology

## 2020-10-11 ENCOUNTER — Other Ambulatory Visit: Payer: Self-pay

## 2020-10-11 ENCOUNTER — Ambulatory Visit
Admission: RE | Admit: 2020-10-11 | Discharge: 2020-10-11 | Disposition: A | Discharge status: Home / Self Care | Payer: 59 | Source: Ambulatory Visit | Attending: Obstetrics and Gynecology | Admitting: Obstetrics and Gynecology

## 2020-10-11 DIAGNOSIS — Z1231 Encounter for screening mammogram for malignant neoplasm of breast: Secondary | ICD-10-CM | POA: Diagnosis not present

## 2020-10-11 NOTE — Telephone Encounter (Signed)
Scheduled appt per 10/28 sch msg - pt is aware of new appt date and time

## 2020-10-21 ENCOUNTER — Telehealth: Payer: Self-pay

## 2020-10-21 ENCOUNTER — Inpatient Hospital Stay: Payer: 59

## 2020-10-21 ENCOUNTER — Other Ambulatory Visit: Payer: Self-pay

## 2020-10-21 ENCOUNTER — Encounter: Payer: Self-pay | Admitting: Hematology and Oncology

## 2020-10-21 ENCOUNTER — Inpatient Hospital Stay: Payer: 59 | Attending: Hematology and Oncology | Admitting: Hematology and Oncology

## 2020-10-21 VITALS — BP 139/91 | HR 75 | Temp 97.2°F | Resp 17 | Ht 65.0 in | Wt 174.8 lb

## 2020-10-21 DIAGNOSIS — R231 Pallor: Secondary | ICD-10-CM | POA: Diagnosis not present

## 2020-10-21 DIAGNOSIS — R2689 Other abnormalities of gait and mobility: Secondary | ICD-10-CM | POA: Diagnosis not present

## 2020-10-21 DIAGNOSIS — M799 Soft tissue disorder, unspecified: Secondary | ICD-10-CM | POA: Diagnosis not present

## 2020-10-21 DIAGNOSIS — Z8572 Personal history of non-Hodgkin lymphomas: Secondary | ICD-10-CM | POA: Diagnosis not present

## 2020-10-21 DIAGNOSIS — R42 Dizziness and giddiness: Secondary | ICD-10-CM | POA: Diagnosis not present

## 2020-10-21 DIAGNOSIS — Z8579 Personal history of other malignant neoplasms of lymphoid, hematopoietic and related tissues: Secondary | ICD-10-CM | POA: Diagnosis not present

## 2020-10-21 DIAGNOSIS — R519 Headache, unspecified: Secondary | ICD-10-CM | POA: Insufficient documentation

## 2020-10-21 DIAGNOSIS — D519 Vitamin B12 deficiency anemia, unspecified: Secondary | ICD-10-CM

## 2020-10-21 DIAGNOSIS — Z85850 Personal history of malignant neoplasm of thyroid: Secondary | ICD-10-CM | POA: Diagnosis not present

## 2020-10-21 DIAGNOSIS — Z79899 Other long term (current) drug therapy: Secondary | ICD-10-CM | POA: Diagnosis not present

## 2020-10-21 DIAGNOSIS — Z809 Family history of malignant neoplasm, unspecified: Secondary | ICD-10-CM

## 2020-10-21 DIAGNOSIS — D72819 Decreased white blood cell count, unspecified: Secondary | ICD-10-CM | POA: Diagnosis not present

## 2020-10-21 LAB — COMPREHENSIVE METABOLIC PANEL
ALT: 9 U/L (ref 0–44)
AST: 19 U/L (ref 15–41)
Albumin: 3.8 g/dL (ref 3.5–5.0)
Alkaline Phosphatase: 57 U/L (ref 38–126)
Anion gap: 10 (ref 5–15)
BUN: 11 mg/dL (ref 6–20)
CO2: 28 mmol/L (ref 22–32)
Calcium: 9 mg/dL (ref 8.9–10.3)
Chloride: 102 mmol/L (ref 98–111)
Creatinine, Ser: 1.29 mg/dL — ABNORMAL HIGH (ref 0.44–1.00)
GFR, Estimated: 49 mL/min — ABNORMAL LOW (ref >60)
Glucose, Bld: 99 mg/dL (ref 70–99)
Potassium: 4.1 mmol/L (ref 3.5–5.1)
Sodium: 140 mmol/L (ref 135–145)
Total Bilirubin: 0.7 mg/dL (ref 0.3–1.2)
Total Protein: 7.3 g/dL (ref 6.5–8.1)

## 2020-10-21 LAB — VITAMIN B12: Vitamin B-12: 783 pg/mL (ref 180–914)

## 2020-10-21 LAB — CBC WITH DIFFERENTIAL/PLATELET
Abs Immature Granulocytes: 0 10*3/uL (ref 0.00–0.07)
Basophils Absolute: 0 10*3/uL (ref 0.0–0.1)
Basophils Relative: 1 %
Eosinophils Absolute: 0.1 10*3/uL (ref 0.0–0.5)
Eosinophils Relative: 3 %
HCT: 37.2 % (ref 36.0–46.0)
Hemoglobin: 12 g/dL (ref 12.0–15.0)
Immature Granulocytes: 0 %
Lymphocytes Relative: 44 %
Lymphs Abs: 1.3 10*3/uL (ref 0.7–4.0)
MCH: 26.5 pg (ref 26.0–34.0)
MCHC: 32.3 g/dL (ref 30.0–36.0)
MCV: 82.1 fL (ref 80.0–100.0)
Monocytes Absolute: 0.3 10*3/uL (ref 0.1–1.0)
Monocytes Relative: 9 %
Neutro Abs: 1.2 10*3/uL — ABNORMAL LOW (ref 1.7–7.7)
Neutrophils Relative %: 43 %
Platelets: 304 10*3/uL (ref 150–400)
RBC: 4.53 MIL/uL (ref 3.87–5.11)
RDW: 13.5 % (ref 11.5–15.5)
WBC: 2.9 10*3/uL — ABNORMAL LOW (ref 4.0–10.5)
nRBC: 0 % (ref 0.0–0.2)

## 2020-10-21 LAB — LACTATE DEHYDROGENASE: LDH: 196 U/L — ABNORMAL HIGH (ref 98–192)

## 2020-10-21 NOTE — Progress Notes (Signed)
[facility] PROGRESS NOTE  Patient Care Team: Jacelyn Pi, MD as PCP - General (Endocrinology) Servando Salina, MD as PCP - OBGYN (Obstetrics and Gynecology) Heath Lark, MD as Consulting Physician (Hematology and Oncology) Leota Sauers, RN (Inactive) as Oncology Nurse Navigator Eppie Gibson, MD as Attending Physician (Radiation Oncology) Frederik Pear, MD as Consulting Physician (Orthopedic Surgery) Fanny Skates, MD as Consulting Physician (General Surgery)  ASSESSMENT & PLAN:  History of lymphoma From her lymphoma standpoint, her physical examination and blood work are benign She has significant concern that the skin changes could be due to malignancy We discussed the risk and benefits of CT imaging For now, we will proceed with bone marrow biopsy as discussed below  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 She has read some association between chemotherapy/radiation treatment causing bone marrow damage/leukemia We discussed the risk and benefits of bone marrow biopsy and she is in agreement to proceed I will see her back within 2 weeks of bone marrow biopsy to discuss Currently, she is not symptomatic from leukopenia  Hx of papillary thyroid carcinoma She will continue close follow-up with her endocrinologist  Musculoskeletal disorder She is concerned about her joint problems She is wondering whether she could have leukemia that damaged her joint Based on her blood work and examination today, I do not believe this is the case but the golden test to rule out leukemia would be bone marrow biopsy and we will proceed with biopsy as discussed  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 However, I am not a dermatology expert We discussed the risk and benefits of referring her to see dermatology for biopsy For now, she would like to complete her work-up with bone marrow biopsy  first  Imbalance She has some problems with balance and dizzy spells She also have occasional headaches I have ordered vitamin B12 level that came back normal We discussed neurology referral She would like to think about it and we will discuss this in her next visit  Family history of cancer She has strong family history of malignancies and personal history of malignancy We discussed the risk and benefit of genetic referral She will think about it   Orders Placed This Encounter  Procedures  . CT BIOPSY    Standing Status:   Future    Standing Expiration Date:   10/21/2021    Order Specific Question:   Lab orders requested (DO NOT place separate lab orders, these will be automatically ordered during procedure specimen collection):    Answer:   Surgical Pathology    Order Specific Question:   Reason for Exam (SYMPTOM  OR DIAGNOSIS REQUIRED)    Answer:   bone marrow biopsy    Order Specific Question:   Is patient pregnant?    Answer:   No    Order Specific Question:   Preferred imaging location?    Answer:   [facility]    Order Specific Question:   Radiology Contrast Protocol - do NOT remove file path    Answer:   [email or web address]  . CT BONE MARROW BIOPSY & ASPIRATION    Standing Status:   Future    Standing Expiration Date:   10/21/2021    Order Specific Question:   Reason for Exam (SYMPTOM  OR DIAGNOSIS REQUIRED)    Answer:   Hx lymphoma, chronic leukopenia    Order Specific Question:   Preferred imaging location?    Answer:  [facility]    Order Specific Question:   Radiology Contrast Protocol - do NOT remove file path    Answer:   [email or web address]    Order Specific Question:   Is patient pregnant?    Answer:   No  . Vitamin B12    Standing Status:   Future    Number of Occurrences:   1    Standing Expiration Date:   10/21/2021    All questions were answered. The patient knows to call  the clinic with any problems, questions or concerns. The total time spent in the appointment was 40 minutes encounter with patients including review of chart and various tests results, discussions about plan of care and coordination of care plan   Heath Lark, MD 10/21/2020 11:23 AM  INTERVAL HISTORY: Please see below for problem oriented charting. She returns for further follow-up on history of lymphoma She was lost to follow-up since 2019 She has concerns She saw her gynecologist recently and has noticed some abnormal skin changes on the lower torso and both thighs The skin changes are not associated with itching She has some occasional headaches, dizzy spells and off balance but no falls No recent infection, fever or chills No new lymphadenopathy She has some muscular aches in her legs and shoulder She is very concerned about her abnormal blood work She has read that her skin changes in prior chemotherapy can cause risk of leukemia She have strong family history of cancer and personal history of malignancies  SUMMARY OF ONCOLOGIC HISTORY: Oncology History Overview Note  DLBCL (diffuse large B cell lymphoma)   Staging form: Lymphoid Neoplasms, AJCC 6th Edition     Clinical stage from 11/15/2014: Stage II - Signed by Heath Lark, MD on 11/15/2014     History of lymphoma  09/14/2014 Imaging   PET/CT scan showed Hypermetabolic bilateral cervical lymphadenopathy, left side greater than right, and sub-cm hypermetabolic left subpectoral lymph node. Hypermetabolic activity throughout the lingular and palatine tonsils in Waldeyer's ring   10/12/2014 Procedure   Biopsy of the left neck lymph node came back positive for diffuse large B-cell lymphoma.Accession: [id number]   10/24/2014 Procedure   PAC placed.   10/24/2014 Bone Marrow Biopsy   Bone marrow biopsy was negative.Cytogenetics were normal   10/24/2014 Imaging   Echocardiogram showed normal ejection fraction   10/25/2014 -  12/06/2014 Chemotherapy   She received 3 cyles of R CHOP chemotherapy. Further doses of R CHOP chemotherapy was reduced due to recent neutropenic fever with cycle 1 of treatment.   11/01/2014 Adverse Reaction   She was seen in the emergency Department for possible neutropenic fever. Cultures were negative. She received antibiotic therapy.   11/15/2014 - 12/06/2014 Chemotherapy   Intrathecal chemotherapy is added due to risk of CNS relapse   01/04/2015 Imaging   PET CT scan show complete remission from lymphadenopathy. Incidental finding of right shoulder arthropathy   01/16/2015 Imaging   MRI of the right shoulder show no evidence of cancer, with evidence of tendon tear and possible changes related to trauma   02/05/2015 - 02/27/2015 Radiation Therapy   IMRT to tonsils, neck and L subpectoral nodal bed:  30.6 Gy in 17 fractions.   04/25/2015 Imaging   CT neck, chest, abdomen/pelvis:  No findings for active lymphomatous involvement.   07/16/2015 Imaging   PET/ CT scans show no evidence of lymphoma. It showed persistent abnormality in the right humeral area   10/18/2015 Procedure   PAC removed.   03/26/2016  Imaging   CT scan of chest, abdomen and pelvis showed no evidence of lymphoma. Noted changes suspicious of infection   03/26/2016 Imaging   CT neck showed asymmetry of the lower palatine tonsils with slight fullness on the left and underfilling of air column at this level.   06/19/2016 Procedure   EGD and colonoscopy showed nonerosive esophagitis   01/11/2017 Imaging   No evidence of recurrent lymphoma or other acute findings within the thorax.   10/06/2017 PET scan   1. No hypermetabolic lymphadenopathy or other definite findings of recurrent lymphoma. 2. New mild patchy hypermetabolism in the left hip joint with associated asymmetric osteoarthritis on the CT images, most compatible with degenerative uptake. No discrete joint erosions. 3. New symmetric uniform hypermetabolism in the  bilateral parotid glands, without discrete parotid mass on the CT images, favoring inflammatory/physiologic uptake. 4. Symmetric mild hypermetabolism in the palatine tonsils and base of tongue bilaterally, without discrete mass correlate on the CT images, favoring inflammatory/physiologic uptake.   04/21/2018 PET scan   1. No evidence of lymphoma recurrence. 2. Stable hypermetabolic activity in the lingual tonsils and parotid glands is favored physiologic.     REVIEW OF SYSTEMS:   Constitutional: Denies fevers, chills or abnormal weight loss Eyes: Denies blurriness of vision Ears, nose, mouth, throat, and face: Denies mucositis or sore throat Respiratory: Denies cough, dyspnea or wheezes Cardiovascular: Denies palpitation, chest discomfort or lower extremity swelling Gastrointestinal:  Denies nausea, heartburn or change in bowel habits Lymphatics: Denies new lymphadenopathy or easy bruising Behavioral/Psych: Mood is stable, no new changes  All other systems were reviewed with the patient and are negative.  I have reviewed the past medical history, past surgical history, social history and family history with the patient and they are unchanged from previous note.  ALLERGIES:  is allergic to metformin and related, tape, levaquin [levofloxacin], and penicillins.  MEDICATIONS:  Current Outpatient Medications  Medication Sig Dispense Refill  . polyethylene glycol (MIRALAX / GLYCOLAX) 17 g packet Take 17 g by mouth daily.    . Biotin 5000 MCG TABS Take 5,000 mcg by mouth daily.     . cyanocobalamin 500 MCG tablet Take 500 mcg by mouth daily with lunch.     . docusate sodium (COLACE) 100 MG capsule Take 1 capsule (100 mg total) by mouth 2 (two) times daily. 30 capsule 0  . levothyroxine (SYNTHROID, LEVOTHROID) 137 MCG tablet Take 137 mcg by mouth daily before breakfast.    . Multiple Vitamin (MULITIVITAMIN WITH MINERALS) TABS Take 1 tablet by mouth every morning.     . Vitamin D,  Ergocalciferol, (DRISDOL) 1.25 MG (50000 UT) CAPS capsule Take 50,000 Units by mouth every 7 (seven) days.     No current facility-administered medications for this visit.    PHYSICAL EXAMINATION: ECOG PERFORMANCE STATUS: 1 - Symptomatic but completely ambulatory  Vitals:   10/21/20 0849  BP: (!) 139/91  Pulse: 75  Resp: 17  Temp: (!) 97.2 F (36.2 C)  SpO2: 100%   Filed Weights   10/21/20 0849  Weight: 174 lb 12.8 oz (79.3 kg)    GENERAL:alert, no distress and comfortable SKIN: Noted abnormal skin changes on her lower abdomen and both thighs consistent with livedo reticularis EYES: normal, Conjunctiva are pink and non-injected, sclera clear OROPHARYNX:no exudate, no erythema and lips, buccal mucosa, and tongue normal  NECK: Well-healed surgical scar LYMPH:  no palpable lymphadenopathy in the cervical, axillary or inguinal LUNGS: clear to auscultation and percussion with normal breathing effort  HEART: regular rate & rhythm and no murmurs and no lower extremity edema ABDOMEN:abdomen soft, non-tender and normal bowel sounds Musculoskeletal:no cyanosis of digits and no clubbing  NEURO: alert & oriented x 3 with fluent speech, no focal motor/sensory deficits  LABORATORY DATA:  I have reviewed the data as listed    Component Value Date/Time   NA 140 10/21/2020 0819   NA 142 09/24/2017 1438   K 4.1 10/21/2020 0819   K 3.6 09/24/2017 1438   CL 102 10/21/2020 0819   CL 102 08/31/2012 1336   CO2 28 10/21/2020 0819   CO2 28 09/24/2017 1438   GLUCOSE 99 10/21/2020 0819   GLUCOSE 92 09/24/2017 1438   GLUCOSE 95 08/31/2012 1336   BUN 11 10/21/2020 0819   BUN 12.6 09/24/2017 1438   CREATININE 1.29 (H) 10/21/2020 0819   CREATININE 0.9 09/24/2017 1438   CALCIUM 9.0 10/21/2020 0819   CALCIUM 9.0 09/24/2017 1438   PROT 7.3 10/21/2020 0819   PROT 6.7 09/24/2017 1438   ALBUMIN 3.8 10/21/2020 0819   ALBUMIN 3.5 09/24/2017 1438   AST 19 10/21/2020 0819   AST 15 09/24/2017 1438    ALT 9 10/21/2020 0819   ALT 8 09/24/2017 1438   ALKPHOS 57 10/21/2020 0819   ALKPHOS 73 09/24/2017 1438   BILITOT 0.7 10/21/2020 0819   BILITOT 0.35 09/24/2017 1438   GFRNONAA 49 (L) 10/21/2020 0819   GFRAA >60 09/08/2019 1013    No results found for: SPEP, UPEP  Lab Results  Component Value Date   WBC 2.9 (L) 10/21/2020   NEUTROABS 1.2 (L) 10/21/2020   HGB 12.0 10/21/2020   HCT 37.2 10/21/2020   MCV 82.1 10/21/2020   PLT 304 10/21/2020      Chemistry      Component Value Date/Time   NA 140 10/21/2020 0819   NA 142 09/24/2017 1438   K 4.1 10/21/2020 0819   K 3.6 09/24/2017 1438   CL 102 10/21/2020 0819   CL 102 08/31/2012 1336   CO2 28 10/21/2020 0819   CO2 28 09/24/2017 1438   BUN 11 10/21/2020 0819   BUN 12.6 09/24/2017 1438   CREATININE 1.29 (H) 10/21/2020 0819   CREATININE 0.9 09/24/2017 1438      Component Value Date/Time   CALCIUM 9.0 10/21/2020 0819   CALCIUM 9.0 09/24/2017 1438   ALKPHOS 57 10/21/2020 0819   ALKPHOS 73 09/24/2017 1438   AST 19 10/21/2020 0819   AST 15 09/24/2017 1438   ALT 9 10/21/2020 0819   ALT 8 09/24/2017 1438   BILITOT 0.7 10/21/2020 0819   BILITOT 0.35 09/24/2017 1438

## 2020-10-21 NOTE — Telephone Encounter (Signed)
-----   Message from Heath Lark, MD sent at 10/21/2020 11:16 AM EST ----- Regarding: pls call and let her know B12 level is ok

## 2020-10-21 NOTE — Telephone Encounter (Signed)
Called and given below message. She verbalized understanding. 

## 2020-10-21 NOTE — Assessment & Plan Note (Signed)
She will continue close follow-up with her endocrinologist

## 2020-10-21 NOTE — Assessment & Plan Note (Signed)
She has strong family history of malignancies and personal history of malignancy We discussed the risk and benefit of genetic referral She will think about it

## 2020-10-21 NOTE — Assessment & Plan Note (Signed)
She has chronic leukopenia since treatment She has read some association between chemotherapy/radiation treatment causing bone marrow damage/leukemia We discussed the risk and benefits of bone marrow biopsy and she is in agreement to proceed I will see her back within 2 weeks of bone marrow biopsy to discuss Currently, she is not symptomatic from leukopenia

## 2020-10-21 NOTE — Assessment & Plan Note (Signed)
From her lymphoma standpoint, her physical examination and blood work are benign She has significant concern that the skin changes could be due to malignancy We discussed the risk and benefits of CT imaging For now, we will proceed with bone marrow biopsy as discussed below 

## 2020-10-21 NOTE — Assessment & Plan Note (Signed)
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

## 2020-10-21 NOTE — Assessment & Plan Note (Signed)
She has some problems with balance and dizzy spells She also have occasional headaches I have ordered vitamin B12 level that came back normal We discussed neurology referral She would like to think about it and we will discuss this in her next visit

## 2020-10-21 NOTE — Assessment & Plan Note (Signed)
She is concerned about her joint problems She is wondering whether she could have leukemia that damaged her joint Based on her blood work and examination today, I do not believe this is the case but the golden test to rule out leukemia would be bone marrow biopsy and we will proceed with biopsy as discussed

## 2020-10-24 ENCOUNTER — Telehealth: Payer: Self-pay | Admitting: Hematology and Oncology

## 2020-10-24 NOTE — Telephone Encounter (Signed)
Scheduled apt per 11/11 sch msg- pt is aware of appt date and time

## 2020-10-29 DIAGNOSIS — E890 Postprocedural hypothyroidism: Secondary | ICD-10-CM | POA: Diagnosis not present

## 2020-10-29 DIAGNOSIS — Z79890 Hormone replacement therapy (postmenopausal): Secondary | ICD-10-CM | POA: Diagnosis not present

## 2020-10-29 DIAGNOSIS — R238 Other skin changes: Secondary | ICD-10-CM | POA: Diagnosis not present

## 2020-10-29 DIAGNOSIS — E119 Type 2 diabetes mellitus without complications: Secondary | ICD-10-CM | POA: Diagnosis not present

## 2020-10-31 ENCOUNTER — Other Ambulatory Visit: Payer: Self-pay | Admitting: Student

## 2020-11-01 ENCOUNTER — Other Ambulatory Visit: Payer: Self-pay

## 2020-11-01 ENCOUNTER — Encounter (HOSPITAL_COMMUNITY): Payer: Self-pay

## 2020-11-01 ENCOUNTER — Ambulatory Visit (HOSPITAL_COMMUNITY)
Admission: RE | Admit: 2020-11-01 | Discharge: 2020-11-01 | Disposition: A | Discharge status: Home / Self Care | Payer: 59 | Source: Ambulatory Visit | Attending: Hematology and Oncology | Admitting: Hematology and Oncology

## 2020-11-01 DIAGNOSIS — Z880 Allergy status to penicillin: Secondary | ICD-10-CM | POA: Insufficient documentation

## 2020-11-01 DIAGNOSIS — Z8579 Personal history of other malignant neoplasms of lymphoid, hematopoietic and related tissues: Secondary | ICD-10-CM | POA: Diagnosis not present

## 2020-11-01 DIAGNOSIS — Z79899 Other long term (current) drug therapy: Secondary | ICD-10-CM | POA: Insufficient documentation

## 2020-11-01 DIAGNOSIS — D72819 Decreased white blood cell count, unspecified: Secondary | ICD-10-CM | POA: Diagnosis not present

## 2020-11-01 DIAGNOSIS — Z79890 Hormone replacement therapy (postmenopausal): Secondary | ICD-10-CM | POA: Insufficient documentation

## 2020-11-01 DIAGNOSIS — Z888 Allergy status to other drugs, medicaments and biological substances status: Secondary | ICD-10-CM | POA: Insufficient documentation

## 2020-11-01 DIAGNOSIS — D519 Vitamin B12 deficiency anemia, unspecified: Secondary | ICD-10-CM

## 2020-11-01 DIAGNOSIS — Z85850 Personal history of malignant neoplasm of thyroid: Secondary | ICD-10-CM | POA: Insufficient documentation

## 2020-11-01 DIAGNOSIS — D649 Anemia, unspecified: Secondary | ICD-10-CM | POA: Diagnosis not present

## 2020-11-01 DIAGNOSIS — C8330 Diffuse large B-cell lymphoma, unspecified site: Secondary | ICD-10-CM | POA: Diagnosis not present

## 2020-11-01 DIAGNOSIS — C8339 Diffuse large B-cell lymphoma, extranodal and solid organ sites: Secondary | ICD-10-CM | POA: Diagnosis not present

## 2020-11-01 DIAGNOSIS — Z08 Encounter for follow-up examination after completed treatment for malignant neoplasm: Secondary | ICD-10-CM | POA: Diagnosis present

## 2020-11-01 LAB — CBC WITH DIFFERENTIAL/PLATELET
Abs Immature Granulocytes: 0 10*3/uL (ref 0.00–0.07)
Basophils Absolute: 0 10*3/uL (ref 0.0–0.1)
Basophils Relative: 0 %
Eosinophils Absolute: 0.1 10*3/uL (ref 0.0–0.5)
Eosinophils Relative: 3 %
HCT: 38 % (ref 36.0–46.0)
Hemoglobin: 11.9 g/dL — ABNORMAL LOW (ref 12.0–15.0)
Immature Granulocytes: 0 %
Lymphocytes Relative: 50 %
Lymphs Abs: 1.5 10*3/uL (ref 0.7–4.0)
MCH: 26.7 pg (ref 26.0–34.0)
MCHC: 31.3 g/dL (ref 30.0–36.0)
MCV: 85.4 fL (ref 80.0–100.0)
Monocytes Absolute: 0.3 10*3/uL (ref 0.1–1.0)
Monocytes Relative: 9 %
Neutro Abs: 1.1 10*3/uL — ABNORMAL LOW (ref 1.7–7.7)
Neutrophils Relative %: 38 %
Platelets: 260 10*3/uL (ref 150–400)
RBC: 4.45 MIL/uL (ref 3.87–5.11)
RDW: 13.6 % (ref 11.5–15.5)
WBC: 3 10*3/uL — ABNORMAL LOW (ref 4.0–10.5)
nRBC: 0 % (ref 0.0–0.2)

## 2020-11-01 MED ORDER — FLUMAZENIL 0.5 MG/5ML IV SOLN
INTRAVENOUS | Status: AC
  Filled 2020-11-01: qty 5

## 2020-11-01 MED ORDER — LIDOCAINE HCL (PF) 1 % IJ SOLN
INTRAMUSCULAR | Status: AC | PRN
  Administered 2020-11-01: 10 mL via INTRADERMAL

## 2020-11-01 MED ORDER — MIDAZOLAM HCL 2 MG/2ML IJ SOLN
INTRAMUSCULAR | Status: AC | PRN
  Administered 2020-11-01: 1 mg via INTRAVENOUS
  Administered 2020-11-01 (×2): 0.5 mg via INTRAVENOUS

## 2020-11-01 MED ORDER — MIDAZOLAM HCL 2 MG/2ML IJ SOLN
INTRAMUSCULAR | Status: AC
  Filled 2020-11-01: qty 4

## 2020-11-01 MED ORDER — FENTANYL CITRATE (PF) 100 MCG/2ML IJ SOLN
INTRAMUSCULAR | Status: AC | PRN
Start: 2020-11-01 — End: 2020-11-01
  Administered 2020-11-01: 50 ug via INTRAVENOUS

## 2020-11-01 MED ORDER — NALOXONE HCL 0.4 MG/ML IJ SOLN
INTRAMUSCULAR | Status: AC
  Filled 2020-11-01: qty 1

## 2020-11-01 MED ORDER — FENTANYL CITRATE (PF) 100 MCG/2ML IJ SOLN
INTRAMUSCULAR | Status: AC
  Filled 2020-11-01: qty 2

## 2020-11-01 MED ORDER — SODIUM CHLORIDE 0.9 % IV SOLN: INTRAVENOUS | Status: DC

## 2020-11-01 NOTE — Discharge Instructions (Signed)
Please call Interventional Radiology clinic [phone] with any questions or concerns.  You may remove your dressing and shower tomorrow.   Bone Marrow Aspiration and Bone Marrow Biopsy, Adult, Care After This sheet gives you information about how to care for yourself after your procedure. Your health care provider may also give you more specific instructions. If you have problems or questions, contact your health care provider. What can I expect after the procedure? After the procedure, it is common to have:  Mild pain and tenderness.  Swelling.  Bruising. Follow these instructions at home: Puncture site care   Follow instructions from your health care provider about how to take care of the puncture site. Make sure you: ? Wash your hands with soap and water before and after you change your bandage (dressing). If soap and water are not available, use hand sanitizer. ? Change your dressing as told by your health care provider.  Check your puncture site every day for signs of infection. Check for: ? More redness, swelling, or pain. ? Fluid or blood. ? Warmth. ? Pus or a bad smell. Activity  Return to your normal activities as told by your health care provider. Ask your health care provider what activities are safe for you.  Do not lift anything that is heavier than 10 lb (4.5 kg), or the limit that you are told, until your health care provider says that it is safe.  Do not drive for 24 hours if you were given a sedative during your procedure. General instructions   Take over-the-counter and prescription medicines only as told by your health care provider.  Do not take baths, swim, or use a hot tub until your health care provider approves. Ask your health care provider if you may take showers. You may only be allowed to take sponge baths.  If directed, put ice on the affected area. To do this: ? Put ice in a plastic bag. ? Place a towel between your skin and the  bag. ? Leave the ice on for 20 minutes, 2-3 times a day.  Keep all follow-up visits as told by your health care provider. This is important. Contact a health care provider if:  Your pain is not controlled with medicine.  You have a fever.  You have more redness, swelling, or pain around the puncture site.  You have fluid or blood coming from the puncture site.  Your puncture site feels warm to the touch.  You have pus or a bad smell coming from the puncture site. Summary  After the procedure, it is common to have mild pain, tenderness, swelling, and bruising.  Follow instructions from your health care provider about how to take care of the puncture site and what activities are safe for you.  Take over-the-counter and prescription medicines only as told by your health care provider.  Contact a health care provider if you have any signs of infection, such as fluid or blood coming from the puncture site. This information is not intended to replace advice given to you by your health care provider. Make sure you discuss any questions you have with your health care provider. Document Revised: 04/18/2019 Document Reviewed: 04/18/2019 Elsevier Patient Education  2020 [employer]   Moderate Conscious Sedation, Adult, Care After These instructions provide you with information about caring for yourself after your procedure. Your health care provider may also give you more specific instructions. Your treatment has been planned according to current medical practices, but problems sometimes occur. Call   your health care provider if you have any problems or questions after your procedure. What can I expect after the procedure? After your procedure, it is common:  To feel sleepy for several hours.  To feel clumsy and have poor balance for several hours.  To have poor judgment for several hours.  To vomit if you eat too soon. Follow these instructions at home: For at least 24 hours after  the procedure:   Do not: ? Participate in activities where you could fall or become injured. ? Drive. ? Use heavy machinery. ? Drink alcohol. ? Take sleeping pills or medicines that cause drowsiness. ? Make important decisions or sign legal documents. ? Take care of children on your own.  Rest. Eating and drinking  Follow the diet recommended by your health care provider.  If you vomit: ? Drink water, juice, or soup when you can drink without vomiting. ? Make sure you have little or no nausea before eating solid foods. General instructions  Have a responsible adult stay with you until you are awake and alert.  Take over-the-counter and prescription medicines only as told by your health care provider.  If you smoke, do not smoke without supervision.  Keep all follow-up visits as told by your health care provider. This is important. Contact a health care provider if:  You keep feeling nauseous or you keep vomiting.  You feel light-headed.  You develop a rash.  You have a fever. Get help right away if:  You have trouble breathing. This information is not intended to replace advice given to you by your health care provider. Make sure you discuss any questions you have with your health care provider. Document Revised: 11/12/2017 Document Reviewed: 03/21/2016 Elsevier Patient Education  2020 [employer] Inc[employer]  

## 2020-11-01 NOTE — Procedures (Signed)
Interventional Radiology Procedure Note  Procedure: CT guided bone marrow aspiration and biopsy  Complications: None  EBL: < 10 mL  Findings: Aspirate and core biopsy performed of bone marrow in right iliac bone.  Plan: Bedrest supine x 1 hrs  Shanautica Forker T. Dereona Kolodny, M.D Pager:  [phone]   

## 2020-11-01 NOTE — Progress Notes (Signed)
Chief Complaint: Patient was seen in consultation today for history of lymphoma  Referring Physician(s): [place]  Supervising Physician: Aletta Edouard  Patient Status: [facility] - Out-pt  History of Present Illness: Maria Simmons is a 54 y.o. female with past medical history of diffuse large B cell lymphoma as well as thyroid cancer.  She has noticed recent changes in her health including decreased appetite, difficulty swallowing vs. Nausea, skin discoloration.  After recent evaluation with her oncologist, decision made to proceed with bone marrow biopsy to rule out recurrent vs. New disease.   [name] presents today in her usual state of health.  She confirms the history of above and states that she also has an appointment with Dr. Cristina Gong in December to continue investigating her symptoms.  She has been NPO today. She is agreeable to proceed with bone marrow biopsy.   Past Medical History:  Diagnosis Date  . Arthritis of shoulder region, right, degenerative    right shoulder   . Bladder prolapse, female, acquired   . Cancer [facility]) 2007   thyroid, papillary  . Change in bowel habits 03/19/2016  . Constipation   . DLBCL (diffuse large B cell lymphoma) ([place])   . History of B-cell lymphoma 11/18/2015  . Hypothyroidism   . Neutropenic fever ([place]) 11/01/2014  . PONV (postoperative nausea and vomiting)    during hysterectomy   . S/P radiation therapy 02/05/15-02/27/15   tonsil/neck,lt subpectoral nodal bed 30.6Gy /17 fx  . Thyroid cancer ([place])   . Wears glasses     Past Surgical History:  Procedure Laterality Date  . ABDOMINAL HYSTERECTOMY  2000  . COLONOSCOPY    . COLONOSCOPY WITH PROPOFOL N/A 06/19/2016   Procedure: COLONOSCOPY WITH PROPOFOL;  Surgeon: Ronald Lobo, MD;  Location: [place];  Service: Endoscopy;  Laterality: N/A;  . CYSTOCELE REPAIR N/A 05/03/2018   Procedure: ANTERIOR REPAIR (CYSTOCELE);  Surgeon: Bjorn Loser, MD;  Location: [facility];   Service: Urology;  Laterality: N/A;  . CYSTOSCOPY N/A 05/03/2018   Procedure: CYSTOSCOPY;  Surgeon: Bjorn Loser, MD;  Location: [facility];  Service: Urology;  Laterality: N/A;  . ESOPHAGOGASTRODUODENOSCOPY (EGD) WITH PROPOFOL N/A 06/19/2016   Procedure: ESOPHAGOGASTRODUODENOSCOPY (EGD) WITH PROPOFOL;  Surgeon: Ronald Lobo, MD;  Location: [facility] ENDOSCOPY;  Service: Endoscopy;  Laterality: N/A;  . LAPAROSCOPIC GASTRIC BANDING WITH HIATAL HERNIA REPAIR  05/28/2009  . LYMPH NODE BIOPSY Left 10/12/2014   Procedure: excisional biopsy deep left neck lymph node;  Surgeon: Fanny Skates, MD;  Location: [place];  Service: General;  Laterality: Left;  . PORT-A-CATH REMOVAL N/A 10/18/2015   Procedure: REMOVAL PORT-A-CATH;  Surgeon: Fanny Skates, MD;  Location: [facility];  Service: General;  Laterality: N/A;  . PORTACATH PLACEMENT Right 10/24/2014   Procedure: INSERTION PORT-A-CATH WITH ULTRASOUND GUIDANCE;  Surgeon: Fanny Skates, MD;  Location: [place];  Service: General;  Laterality: Right;  . 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  [name] TOTAL HIP ARTHROPLASTY Left 11/12/2017   Procedure: LEFT TOTAL HIP ARTHROPLASTY ANTERIOR APPROACH;  Surgeon: Dorna Leitz, MD;  Location: [facility];  Service: Orthopedics;  Laterality: Left;  . TOTAL HIP ARTHROPLASTY Right 09/15/2019   Procedure: TOTAL HIP ARTHROPLASTY ANTERIOR APPROACH;  Surgeon: Dorna Leitz, MD;  Location: [facility];  Service: Orthopedics;  Laterality: Right;  . TUBAL LIGATION  1990  . VAGINAL PROLAPSE REPAIR N/A 05/03/2018   Procedure: VAULT PROLAPSE REPAIR WITH GRAFT;  Surgeon: Bjorn Loser, MD;  Location:  [facility];  Service: Urology;  Laterality: N/A;    Allergies: Metformin and related, Tape, Levaquin [levofloxacin], and Penicillins  Medications: Prior to Admission medications   Medication Sig Start Date End Date Taking? Authorizing Provider  Biotin 5000 MCG  TABS Take 5,000 mcg by mouth daily.    Yes [provider]  cyanocobalamin 500 MCG tablet Take 500 mcg by mouth daily with lunch.    Yes [provider]  docusate sodium (COLACE) 100 MG capsule Take 1 capsule (100 mg total) by mouth 2 (two) times daily. 09/15/19  Yes Gary Fleet, PA-C  levothyroxine (SYNTHROID, LEVOTHROID) 137 MCG tablet Take 137 mcg by mouth daily before breakfast.   Yes [provider]  Multiple Vitamin (MULITIVITAMIN WITH MINERALS) TABS Take 1 tablet by mouth every morning.    Yes [provider]  polyethylene glycol (MIRALAX / GLYCOLAX) 17 g packet Take 17 g by mouth daily.   Yes [provider]  Vitamin D, Ergocalciferol, (DRISDOL) 1.25 MG (50000 UT) CAPS capsule Take 50,000 Units by mouth every 7 (seven) days. 08/17/19  Yes [provider]     Family History  Problem Relation Age of Onset  . Colon cancer Mother   . Cancer Mother        colon ca  . Cancer Father        prostate  . Hypertension Father   . Hyperlipidemia Father   . Stroke Father   . Prostate cancer Father   . Cancer Paternal Grandmother        female organ ca    Social History   Socioeconomic History  . Marital status: Single    Spouse name: Not on file  . Number of children: Not on file  . Years of education: Not on file  . Highest education level: Not on file  Occupational History  . Not on file  Tobacco Use  . Smoking status: Never Smoker  . Smokeless tobacco: Never Used  Vaping Use  . Vaping Use: Never used  Substance and Sexual Activity  . Alcohol use: No  . Drug use: No  . Sexual activity: Yes    Birth control/protection: Surgical  Other Topics Concern  . 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 HAS 2 KIDS-1 GRAND SON AND A GOD GRANDSON.   Social Determinants of Health   Financial Resource Strain:   . Difficulty of Paying Living Expenses: Not on file  Food Insecurity:   .  Worried About Charity fundraiser in the Last Year: Not on file  . Ran Out of Food in the Last Year: Not on file  Transportation Needs:   . Lack of Transportation (Medical): Not on file  . Lack of Transportation (Non-Medical): Not on file  Physical Activity:   . Days of Exercise per Week: Not on file  . Minutes of Exercise per Session: Not on file  Stress:   . Feeling of Stress : Not on file  Social Connections:   . Frequency of Communication with Friends and Family: Not on file  . Frequency of Social Gatherings with Friends and Family: Not on file  . Attends Religious Services: Not on file  . Active Member of Clubs or Organizations: Not on file  . Attends Archivist Meetings: Not on file  . Marital Status: Not on file     Review of Systems: A 12 point ROS discussed and pertinent positives are indicated in  the HPI above.  All other systems are negative.  Review of Systems  Constitutional: Negative for fatigue and fever.  Respiratory: Negative for cough and shortness of breath.   Cardiovascular: Negative for chest pain.  Gastrointestinal: Negative for abdominal pain, diarrhea and nausea.  Genitourinary: Negative for dysuria.  Musculoskeletal: Negative for back pain.  Psychiatric/Behavioral: Negative for behavioral problems and confusion.    Vital Signs: BP (!) 161/89   Pulse 68   Temp 98.7 F (37.1 C) (Oral)   Resp 16   LMP 04/13/2000 (Exact Date)   SpO2 100%   Physical Exam Vitals and nursing note reviewed.  Constitutional:      General: She is not in acute distress.    Appearance: Normal appearance. She is not ill-appearing.  HENT:     Mouth/Throat:     Mouth: Mucous membranes are moist.     Pharynx: Oropharynx is clear.  Cardiovascular:     Rate and Rhythm: Normal rate and regular rhythm.  Pulmonary:     Effort: Pulmonary effort is normal. No respiratory distress.     Breath sounds: Normal breath sounds.  Abdominal:     General: Abdomen is flat.      Palpations: Abdomen is soft.  Skin:    General: Skin is warm and dry.  Neurological:     General: No focal deficit present.     Mental Status: She is alert and oriented to person, place, and time. Mental status is at baseline.  Psychiatric:        Mood and Affect: Mood normal.        Behavior: Behavior normal.        Thought Content: Thought content normal.        Judgment: Judgment normal.      MD Evaluation Airway: WNL Heart: WNL Abdomen: WNL Chest/ Lungs: WNL ASA  Classification: 3 Mallampati/Airway Score: One   Imaging: MM 3D SCREEN BREAST BILATERAL  Result Date: 10/14/2020 CLINICAL DATA:  Screening. EXAM: DIGITAL SCREENING BILATERAL MAMMOGRAM WITH TOMO AND CAD COMPARISON:  Previous exam(s). ACR Breast Density Category b: There are scattered areas of fibroglandular density. FINDINGS: There are no findings suspicious for malignancy. Images were processed with CAD. IMPRESSION: No mammographic evidence of malignancy. A result letter of this screening mammogram will be mailed directly to the patient. RECOMMENDATION: Screening mammogram in one year. (Code:SM-B-01Y) BI-RADS CATEGORY  1: Negative. Electronically Signed   By: Audie Pinto M.D.   On: 10/14/2020 16:07    Labs:  CBC: Recent Labs    10/21/20 0819 11/01/20 0750  WBC 2.9* 3.0*  HGB 12.0 11.9*  HCT 37.2 38.0  PLT 304 260    COAGS: No results for input(s): INR, APTT in the last 8760 hours.  BMP: Recent Labs    10/21/20 0819  NA 140  K 4.1  CL 102  CO2 28  GLUCOSE 99  BUN 11  CALCIUM 9.0  CREATININE 1.29*  GFRNONAA 49*    LIVER FUNCTION TESTS: Recent Labs    10/21/20 0819  BILITOT 0.7  AST 19  ALT 9  ALKPHOS 57  PROT 7.3  ALBUMIN 3.8    TUMOR MARKERS: No results for input(s): AFPTM, CEA, CA199, CHROMGRNA in the last 8760 hours.  Assessment and Plan: Patient with past medical history of diffuse large B cell lymphoma, thyroid cancer presents with complaint of unusual constellation  of symptoms.  She is concern for recurrent disease and after recent visit with oncologist has decided to pursue bone marrow biopsy.  IR consulted for bone marrow biopsy at the request of Dr. Burr Medico. Case reviewed by Dr. Kathlene Cote who approves patient for procedure.  Patient presents today in their usual state of health.  She has been NPO and is not currently on blood thinners.    Risks and benefits was discussed with the patient and/or patient's family including, but not limited to bleeding, infection, damage to adjacent structures or low yield requiring additional tests.  All of the questions were answered and there is agreement to proceed.  Consent signed and in chart.   Thank you for this interesting consult.  I greatly enjoyed meeting Maria Simmons and look forward to participating in their care.  A copy of this report was sent to the requesting provider on this date.  Electronically Signed: Docia Barrier, PA 11/01/2020, 8:23 AM   I spent a total of  30 Minutes   in face to face in clinical consultation, greater than 50% of which was counseling/coordinating care for history of lymphoma.

## 2020-11-06 LAB — SURGICAL PATHOLOGY

## 2020-11-06 MED FILL — SYNTHROID 125 MCG TABLET: 125 | 30 days supply | Qty: 30 | Fill #1

## 2020-11-12 ENCOUNTER — Encounter (HOSPITAL_COMMUNITY): Payer: Self-pay | Admitting: Hematology and Oncology

## 2020-11-13 LAB — SURGICAL PATHOLOGY

## 2020-11-14 ENCOUNTER — Inpatient Hospital Stay: Payer: 59 | Attending: Hematology and Oncology | Admitting: Hematology and Oncology

## 2020-11-14 ENCOUNTER — Other Ambulatory Visit: Payer: Self-pay

## 2020-11-14 DIAGNOSIS — Z8572 Personal history of non-Hodgkin lymphomas: Secondary | ICD-10-CM | POA: Insufficient documentation

## 2020-11-14 DIAGNOSIS — Z85850 Personal history of malignant neoplasm of thyroid: Secondary | ICD-10-CM | POA: Diagnosis not present

## 2020-11-14 DIAGNOSIS — D72819 Decreased white blood cell count, unspecified: Secondary | ICD-10-CM | POA: Diagnosis not present

## 2020-11-14 DIAGNOSIS — R231 Pallor: Secondary | ICD-10-CM | POA: Diagnosis not present

## 2020-11-14 DIAGNOSIS — R1310 Dysphagia, unspecified: Secondary | ICD-10-CM | POA: Diagnosis not present

## 2020-11-14 NOTE — Assessment & Plan Note (Signed)
The cause is unknown I recommend dermatology referral She will discuss this with her primary care doctor

## 2020-11-14 NOTE — Assessment & Plan Note (Signed)
She complains of intermittent dysphagia She has appointment to see her gastroenterologist She had prior neck surgery for thyroid cancer We discussed the risk and benefits of pursuing further evaluation including CT imaging study She will think about

## 2020-11-14 NOTE — Assessment & Plan Note (Signed)
I reviewed the results of her bone marrow biopsy and cytogenetics report I told the patient that her chronic leukopenia is not due to lymphoma or leukemia It is not the cause of her feeling cold all the time She has completed work-up in this regard

## 2020-11-14 NOTE — Progress Notes (Signed)
[facility] OFFICE PROGRESS NOTE  Patient Care Team: Forrest Moron, MD as PCP - General (Internal Medicine) Servando Salina, MD as PCP - OBGYN (Obstetrics and Gynecology) Heath Lark, MD as Consulting Physician (Hematology and Oncology) Fanny Skates, MD as Consulting Physician (General Surgery) Ronald Lobo, MD as Consulting Physician (Gastroenterology)  ASSESSMENT & PLAN:  Chronic leukopenia I reviewed the results of her bone marrow biopsy and cytogenetics report I told the patient that her chronic leukopenia is not due to lymphoma or leukemia It is not the cause of her feeling cold all the time She has completed work-up in this regard  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   All questions were answered. The patient knows to call the clinic with any problems, questions or concerns. The total time spent in the appointment was 20 minutes encounter with patients including review of chart and various tests results, discussions about plan of care and coordination of care plan   Heath Lark, MD 11/14/2020 12:54 PM  INTERVAL HISTORY: Please see below for problem oriented charting. She returns for further follow-up She tolerated recent bone marrow biopsy well She denies recent changes to her skin appearance She complained of intermittent dysphagia and feeling cold all the time She is wondering whether she still have malignancy of appetite She has appointment to see her primary care doctor as well as her gastroenterologist  SUMMARY OF ONCOLOGIC  HISTORY: Oncology History Overview Note  DLBCL (diffuse large B cell lymphoma)   Staging form: Lymphoid Neoplasms, AJCC 6th Edition     Clinical stage from 11/15/2014: Stage II - Signed by Heath Lark, MD on 11/15/2014     History of lymphoma  09/14/2014 Imaging   PET/CT scan showed Hypermetabolic bilateral cervical lymphadenopathy, left side greater than right, and sub-cm hypermetabolic left subpectoral lymph node. Hypermetabolic activity throughout the lingular and palatine tonsils in Waldeyer's ring   10/12/2014 Procedure   Biopsy of the left neck lymph node came back positive for diffuse large B-cell lymphoma.Accession: [id number]   10/24/2014 Procedure   PAC placed.   10/24/2014 Bone Marrow Biopsy   Bone marrow biopsy was negative.Cytogenetics were normal   10/24/2014 Imaging   Echocardiogram showed normal ejection fraction   10/25/2014 - 12/06/2014 Chemotherapy   She received 3 cyles of R CHOP chemotherapy. Further doses of R CHOP chemotherapy was reduced due to recent neutropenic fever with cycle 1 of treatment.   11/01/2014 Adverse Reaction   She was seen in the emergency Department for possible neutropenic fever. Cultures were negative. She received antibiotic therapy.   11/15/2014 - 12/06/2014 Chemotherapy   Intrathecal chemotherapy is added due to risk of CNS relapse   01/04/2015 Imaging   PET CT scan show complete remission from lymphadenopathy. Incidental finding of right shoulder arthropathy   01/16/2015 Imaging   MRI of the right shoulder show no evidence of cancer, with evidence of tendon tear and possible changes related to trauma   02/05/2015 - 02/27/2015 Radiation Therapy   IMRT to tonsils, neck and L subpectoral nodal bed:  30.6 Gy in 17 fractions.   04/25/2015 Imaging  CT neck, chest, abdomen/pelvis:  No findings for active lymphomatous involvement.   07/16/2015 Imaging   PET/ CT scans show no evidence of lymphoma. It showed persistent abnormality in the right  humeral area   10/18/2015 Procedure   PAC removed.   03/26/2016 Imaging   CT scan of chest, abdomen and pelvis showed no evidence of lymphoma. Noted changes suspicious of infection   03/26/2016 Imaging   CT neck showed asymmetry of the lower palatine tonsils with slight fullness on the left and underfilling of air column at this level.   06/19/2016 Procedure   EGD and colonoscopy showed nonerosive esophagitis   01/11/2017 Imaging   No evidence of recurrent lymphoma or other acute findings within the thorax.   10/06/2017 PET scan   1. No hypermetabolic lymphadenopathy or other definite findings of recurrent lymphoma. 2. New mild patchy hypermetabolism in the left hip joint with associated asymmetric osteoarthritis on the CT images, most compatible with degenerative uptake. No discrete joint erosions. 3. New symmetric uniform hypermetabolism in the bilateral parotid glands, without discrete parotid mass on the CT images, favoring inflammatory/physiologic uptake. 4. Symmetric mild hypermetabolism in the palatine tonsils and base of tongue bilaterally, without discrete mass correlate on the CT images, favoring inflammatory/physiologic uptake.   04/21/2018 PET scan   1. No evidence of lymphoma recurrence. 2. Stable hypermetabolic activity in the lingual tonsils and parotid glands is favored physiologic.     REVIEW OF SYSTEMS:   Constitutional: Denies fevers, chills or abnormal weight loss Eyes: Denies blurriness of vision Ears, nose, mouth, throat, and face: Denies mucositis or sore throat Respiratory: Denies cough, dyspnea or wheezes Cardiovascular: Denies palpitation, chest discomfort or lower extremity swelling Gastrointestinal:  Denies nausea, heartburn or change in bowel habits Skin: Denies abnormal skin rashes Lymphatics: Denies new lymphadenopathy or easy bruising Neurological:Denies numbness, tingling or new weaknesses Behavioral/Psych: Mood is stable, no new changes  All other  systems were reviewed with the patient and are negative.  I have reviewed the past medical history, past surgical history, social history and family history with the patient and they are unchanged from previous note.  ALLERGIES:  is allergic to metformin and related, tape, levaquin [levofloxacin], and penicillins.  MEDICATIONS:  Current Outpatient Medications  Medication Sig Dispense Refill  . Biotin 5000 MCG TABS Take 5,000 mcg by mouth daily.     . cyanocobalamin 500 MCG tablet Take 500 mcg by mouth daily with lunch.     . docusate sodium (COLACE) 100 MG capsule Take 1 capsule (100 mg total) by mouth 2 (two) times daily. 30 capsule 0  . levothyroxine (SYNTHROID, LEVOTHROID) 137 MCG tablet Take 137 mcg by mouth daily before breakfast.    . Multiple Vitamin (MULITIVITAMIN WITH MINERALS) TABS Take 1 tablet by mouth every morning.     . polyethylene glycol (MIRALAX / GLYCOLAX) 17 g packet Take 17 g by mouth daily.    . Vitamin D, Ergocalciferol, (DRISDOL) 1.25 MG (50000 UT) CAPS capsule Take 50,000 Units by mouth every 7 (seven) days.     No current facility-administered medications for this visit.    PHYSICAL EXAMINATION: ECOG PERFORMANCE STATUS: 1 - Symptomatic but completely ambulatory  Vitals:   11/14/20 1213  BP: 140/82  Pulse: 73  Resp: 18  Temp: (!) 97.5 F (36.4 C)  SpO2: 100%   Filed Weights   11/14/20 1213  Weight: 174 lb 3.2 oz (79 kg)    GENERAL:alert, no distress and comfortable NEURO: alert & oriented x 3 with  fluent speech, no focal motor/sensory deficits  LABORATORY DATA:  I have reviewed the data as listed    Component Value Date/Time   NA 140 10/21/2020 0819   NA 142 09/24/2017 1438   K 4.1 10/21/2020 0819   K 3.6 09/24/2017 1438   CL 102 10/21/2020 0819   CL 102 08/31/2012 1336   CO2 28 10/21/2020 0819   CO2 28 09/24/2017 1438   GLUCOSE 99 10/21/2020 0819   GLUCOSE 92 09/24/2017 1438   GLUCOSE 95 08/31/2012 1336   BUN 11 10/21/2020 0819   BUN  12.6 09/24/2017 1438   CREATININE 1.29 (H) 10/21/2020 0819   CREATININE 0.9 09/24/2017 1438   CALCIUM 9.0 10/21/2020 0819   CALCIUM 9.0 09/24/2017 1438   PROT 7.3 10/21/2020 0819   PROT 6.7 09/24/2017 1438   ALBUMIN 3.8 10/21/2020 0819   ALBUMIN 3.5 09/24/2017 1438   AST 19 10/21/2020 0819   AST 15 09/24/2017 1438   ALT 9 10/21/2020 0819   ALT 8 09/24/2017 1438   ALKPHOS 57 10/21/2020 0819   ALKPHOS 73 09/24/2017 1438   BILITOT 0.7 10/21/2020 0819   BILITOT 0.35 09/24/2017 1438   GFRNONAA 49 (L) 10/21/2020 0819   GFRAA >60 09/08/2019 1013    No results found for: SPEP, UPEP  Lab Results  Component Value Date   WBC 3.0 (L) 11/01/2020   NEUTROABS 1.1 (L) 11/01/2020   HGB 11.9 (L) 11/01/2020   HCT 38.0 11/01/2020   MCV 85.4 11/01/2020   PLT 260 11/01/2020      Chemistry      Component Value Date/Time   NA 140 10/21/2020 0819   NA 142 09/24/2017 1438   K 4.1 10/21/2020 0819   K 3.6 09/24/2017 1438   CL 102 10/21/2020 0819   CL 102 08/31/2012 1336   CO2 28 10/21/2020 0819   CO2 28 09/24/2017 1438   BUN 11 10/21/2020 0819   BUN 12.6 09/24/2017 1438   CREATININE 1.29 (H) 10/21/2020 0819   CREATININE 0.9 09/24/2017 1438      Component Value Date/Time   CALCIUM 9.0 10/21/2020 0819   CALCIUM 9.0 09/24/2017 1438   ALKPHOS 57 10/21/2020 0819   ALKPHOS 73 09/24/2017 1438   AST 19 10/21/2020 0819   AST 15 09/24/2017 1438   ALT 9 10/21/2020 0819   ALT 8 09/24/2017 1438   BILITOT 0.7 10/21/2020 0819   BILITOT 0.35 09/24/2017 1438

## 2020-11-26 ENCOUNTER — Other Ambulatory Visit (HOSPITAL_COMMUNITY): Payer: Self-pay | Admitting: Gastroenterology

## 2020-11-26 ENCOUNTER — Other Ambulatory Visit: Payer: Self-pay | Admitting: Gastroenterology

## 2020-11-26 DIAGNOSIS — R1319 Other dysphagia: Secondary | ICD-10-CM | POA: Diagnosis not present

## 2020-11-26 DIAGNOSIS — R634 Abnormal weight loss: Secondary | ICD-10-CM | POA: Diagnosis not present

## 2020-11-26 DIAGNOSIS — Z800 Family history of malignant neoplasm of digestive organs: Secondary | ICD-10-CM | POA: Diagnosis not present

## 2020-11-26 DIAGNOSIS — K5904 Chronic idiopathic constipation: Secondary | ICD-10-CM | POA: Diagnosis not present

## 2020-11-26 MED FILL — PANTOPRAZOLE SOD DR 40 MG T: 40 | 90 days supply | Qty: 90 | Fill #0

## 2020-12-02 ENCOUNTER — Ambulatory Visit
Admission: RE | Admit: 2020-12-02 | Discharge: 2020-12-02 | Disposition: A | Discharge status: Home / Self Care | Payer: 59 | Source: Ambulatory Visit | Attending: Gastroenterology | Admitting: Gastroenterology

## 2020-12-02 ENCOUNTER — Other Ambulatory Visit: Payer: Self-pay | Admitting: Gastroenterology

## 2020-12-02 DIAGNOSIS — K224 Dyskinesia of esophagus: Secondary | ICD-10-CM | POA: Diagnosis not present

## 2020-12-02 DIAGNOSIS — R1319 Other dysphagia: Secondary | ICD-10-CM

## 2020-12-02 DIAGNOSIS — R634 Abnormal weight loss: Secondary | ICD-10-CM

## 2020-12-19 MED FILL — SYNTHROID 125 MCG TABLET: 125 | 30 days supply | Qty: 30 | Fill #2

## 2021-01-03 DIAGNOSIS — Z4651 Encounter for fitting and adjustment of gastric lap band: Secondary | ICD-10-CM | POA: Diagnosis not present

## 2021-01-03 DIAGNOSIS — Z9884 Bariatric surgery status: Secondary | ICD-10-CM | POA: Diagnosis not present

## 2021-01-07 DIAGNOSIS — B349 Viral infection, unspecified: Secondary | ICD-10-CM | POA: Diagnosis not present

## 2021-01-07 DIAGNOSIS — Z20822 Contact with and (suspected) exposure to covid-19: Secondary | ICD-10-CM | POA: Diagnosis not present

## 2021-01-07 DIAGNOSIS — J029 Acute pharyngitis, unspecified: Secondary | ICD-10-CM | POA: Diagnosis not present

## 2021-01-17 DIAGNOSIS — Z9884 Bariatric surgery status: Secondary | ICD-10-CM | POA: Diagnosis not present

## 2021-02-25 DIAGNOSIS — K5904 Chronic idiopathic constipation: Secondary | ICD-10-CM | POA: Diagnosis not present

## 2021-02-25 DIAGNOSIS — Z800 Family history of malignant neoplasm of digestive organs: Secondary | ICD-10-CM | POA: Diagnosis not present

## 2021-02-25 DIAGNOSIS — Z1211 Encounter for screening for malignant neoplasm of colon: Secondary | ICD-10-CM | POA: Diagnosis not present

## 2021-02-25 DIAGNOSIS — R634 Abnormal weight loss: Secondary | ICD-10-CM | POA: Diagnosis not present

## 2021-02-25 DIAGNOSIS — R1319 Other dysphagia: Secondary | ICD-10-CM | POA: Diagnosis not present

## 2021-03-10 MED FILL — SYNTHROID 125 MCG TABLET: 125 | 90 days supply | Qty: 90 | Fill #3

## 2021-05-21 DIAGNOSIS — E890 Postprocedural hypothyroidism: Secondary | ICD-10-CM | POA: Diagnosis not present

## 2021-05-21 DIAGNOSIS — E559 Vitamin D deficiency, unspecified: Secondary | ICD-10-CM | POA: Diagnosis not present

## 2021-05-21 DIAGNOSIS — E7800 Pure hypercholesterolemia, unspecified: Secondary | ICD-10-CM | POA: Diagnosis not present

## 2021-05-21 DIAGNOSIS — E1165 Type 2 diabetes mellitus with hyperglycemia: Secondary | ICD-10-CM | POA: Diagnosis not present

## 2021-05-28 ENCOUNTER — Other Ambulatory Visit (HOSPITAL_COMMUNITY): Payer: Self-pay

## 2021-05-28 DIAGNOSIS — E1165 Type 2 diabetes mellitus with hyperglycemia: Secondary | ICD-10-CM | POA: Diagnosis not present

## 2021-05-28 DIAGNOSIS — E559 Vitamin D deficiency, unspecified: Secondary | ICD-10-CM | POA: Diagnosis not present

## 2021-05-28 DIAGNOSIS — E7800 Pure hypercholesterolemia, unspecified: Secondary | ICD-10-CM | POA: Diagnosis not present

## 2021-05-28 DIAGNOSIS — E890 Postprocedural hypothyroidism: Secondary | ICD-10-CM | POA: Diagnosis not present

## 2021-05-28 DIAGNOSIS — C73 Malignant neoplasm of thyroid gland: Secondary | ICD-10-CM | POA: Diagnosis not present

## 2021-05-28 DIAGNOSIS — I10 Essential (primary) hypertension: Secondary | ICD-10-CM | POA: Diagnosis not present

## 2021-05-28 MED ORDER — VITAMIN D (ERGOCALCIFEROL) 1.25 MG (50000 UNIT) PO CAPS
50000.0000 [IU] | ORAL_CAPSULE | ORAL | 3 refills | Status: DC
Start: 2021-05-28 — End: 2021-06-09
  Filled 2021-05-28: qty 4, 28d supply, fill #0

## 2021-05-28 MED ORDER — TRULICITY 0.75 MG/0.5ML ~~LOC~~ SOAJ
0.7500 mg | SUBCUTANEOUS | 1 refills | Status: DC
  Filled 2021-05-28: qty 2, 28d supply, fill #0

## 2021-05-28 MED ORDER — LEVOTHYROXINE SODIUM 150 MCG PO TABS
150.0000 ug | ORAL_TABLET | Freq: Every morning | ORAL | 2 refills | Status: DC
Start: 2021-05-28 — End: 2021-06-09
  Filled 2021-05-28: qty 30, 30d supply, fill #0

## 2021-05-29 ENCOUNTER — Other Ambulatory Visit (HOSPITAL_COMMUNITY): Payer: Self-pay

## 2021-06-07 ENCOUNTER — Other Ambulatory Visit: Payer: Self-pay

## 2021-06-07 ENCOUNTER — Emergency Department (HOSPITAL_COMMUNITY): Payer: 59

## 2021-06-07 ENCOUNTER — Encounter (HOSPITAL_COMMUNITY): Payer: Self-pay | Admitting: *Deleted

## 2021-06-07 ENCOUNTER — Observation Stay (HOSPITAL_COMMUNITY)
Admission: EM | Admit: 2021-06-07 | Discharge: 2021-06-09 | Disposition: A | Discharge status: Home / Self Care | Payer: 59 | Attending: Internal Medicine | Admitting: Internal Medicine

## 2021-06-07 DIAGNOSIS — R4701 Aphasia: Secondary | ICD-10-CM

## 2021-06-07 DIAGNOSIS — G459 Transient cerebral ischemic attack, unspecified: Principal | ICD-10-CM | POA: Insufficient documentation

## 2021-06-07 DIAGNOSIS — Z8572 Personal history of non-Hodgkin lymphomas: Secondary | ICD-10-CM

## 2021-06-07 DIAGNOSIS — Z20822 Contact with and (suspected) exposure to covid-19: Secondary | ICD-10-CM | POA: Insufficient documentation

## 2021-06-07 DIAGNOSIS — Z794 Long term (current) use of insulin: Secondary | ICD-10-CM | POA: Insufficient documentation

## 2021-06-07 DIAGNOSIS — R42 Dizziness and giddiness: Secondary | ICD-10-CM | POA: Diagnosis not present

## 2021-06-07 DIAGNOSIS — Z79899 Other long term (current) drug therapy: Secondary | ICD-10-CM | POA: Insufficient documentation

## 2021-06-07 DIAGNOSIS — E039 Hypothyroidism, unspecified: Secondary | ICD-10-CM | POA: Diagnosis present

## 2021-06-07 DIAGNOSIS — Z8673 Personal history of transient ischemic attack (TIA), and cerebral infarction without residual deficits: Secondary | ICD-10-CM | POA: Diagnosis present

## 2021-06-07 DIAGNOSIS — Z85850 Personal history of malignant neoplasm of thyroid: Secondary | ICD-10-CM

## 2021-06-07 DIAGNOSIS — E119 Type 2 diabetes mellitus without complications: Secondary | ICD-10-CM | POA: Insufficient documentation

## 2021-06-07 DIAGNOSIS — N390 Urinary tract infection, site not specified: Secondary | ICD-10-CM | POA: Diagnosis not present

## 2021-06-07 LAB — COMPREHENSIVE METABOLIC PANEL
ALT: 18 U/L (ref 0–44)
AST: 23 U/L (ref 15–41)
Albumin: 4 g/dL (ref 3.5–5.0)
Alkaline Phosphatase: 70 U/L (ref 38–126)
Anion gap: 8 (ref 5–15)
BUN: 15 mg/dL (ref 6–20)
CO2: 29 mmol/L (ref 22–32)
Calcium: 8.8 mg/dL — ABNORMAL LOW (ref 8.9–10.3)
Chloride: 100 mmol/L (ref 98–111)
Creatinine, Ser: 1.05 mg/dL — ABNORMAL HIGH (ref 0.44–1.00)
GFR, Estimated: >60 mL/min (ref >60)
Glucose, Bld: 102 mg/dL — ABNORMAL HIGH (ref 70–99)
Potassium: 3.7 mmol/L (ref 3.5–5.1)
Sodium: 137 mmol/L (ref 135–145)
Total Bilirubin: 0.7 mg/dL (ref 0.3–1.2)
Total Protein: 7.7 g/dL (ref 6.5–8.1)

## 2021-06-07 LAB — CBC
HCT: 40.4 % (ref 36.0–46.0)
Hemoglobin: 12.8 g/dL (ref 12.0–15.0)
MCH: 27 pg (ref 26.0–34.0)
MCHC: 31.7 g/dL (ref 30.0–36.0)
MCV: 85.2 fL (ref 80.0–100.0)
Platelets: 323 10*3/uL (ref 150–400)
RBC: 4.74 MIL/uL (ref 3.87–5.11)
RDW: 16.1 % — ABNORMAL HIGH (ref 11.5–15.5)
WBC: 4.9 10*3/uL (ref 4.0–10.5)
nRBC: 0 % (ref 0.0–0.2)

## 2021-06-07 LAB — URINALYSIS, ROUTINE W REFLEX MICROSCOPIC
Bacteria, UA: NONE SEEN
Bilirubin Urine: NEGATIVE
Glucose, UA: NEGATIVE mg/dL
Ketones, ur: NEGATIVE mg/dL
Nitrite: NEGATIVE
Protein, ur: NEGATIVE mg/dL
Specific Gravity, Urine: 1.008 (ref 1.005–1.030)
pH: 6 (ref 5.0–8.0)

## 2021-06-07 LAB — GLUCOSE, CAPILLARY: Glucose-Capillary: 93 mg/dL (ref 70–99)

## 2021-06-07 MED ORDER — INSULIN ASPART 100 UNIT/ML IJ SOLN: 0.0000 [IU] | Freq: Three times a day (TID) | INTRAMUSCULAR | Status: DC

## 2021-06-07 MED ORDER — ADULT MULTIVITAMIN W/MINERALS CH
1.0000 | ORAL_TABLET | Freq: Every morning | ORAL | Status: DC
  Administered 2021-06-08 – 2021-06-09 (×2): 1 via ORAL
  Filled 2021-06-07 (×2): qty 1

## 2021-06-07 MED ORDER — SODIUM CHLORIDE 0.9 % IV SOLN: 250.0000 mL | INTRAVENOUS | Status: DC | PRN

## 2021-06-07 MED ORDER — MECLIZINE HCL 12.5 MG PO TABS
25.0000 mg | ORAL_TABLET | Freq: Three times a day (TID) | ORAL | Status: DC | PRN
  Administered 2021-06-08: 25 mg via ORAL
  Filled 2021-06-07: qty 2

## 2021-06-07 MED ORDER — POLYETHYLENE GLYCOL 3350 17 G PO PACK: 17.0000 g | PACK | Freq: Every day | ORAL | Status: DC | PRN

## 2021-06-07 MED ORDER — TRAZODONE HCL 50 MG PO TABS: 50.0000 mg | ORAL_TABLET | Freq: Every evening | ORAL | Status: DC | PRN

## 2021-06-07 MED ORDER — BISACODYL 10 MG RE SUPP: 10.0000 mg | Freq: Every day | RECTAL | Status: DC | PRN

## 2021-06-07 MED ORDER — SODIUM CHLORIDE 0.9% FLUSH
3.0000 mL | Freq: Two times a day (BID) | INTRAVENOUS | Status: DC
  Administered 2021-06-08 – 2021-06-09 (×2): 3 mL via INTRAVENOUS

## 2021-06-07 MED ORDER — POLYETHYLENE GLYCOL 3350 17 G PO PACK
17.0000 g | PACK | Freq: Every day | ORAL | Status: DC
  Administered 2021-06-07 – 2021-06-09 (×3): 17 g via ORAL
  Filled 2021-06-07 (×3): qty 1

## 2021-06-07 MED ORDER — ASPIRIN 325 MG PO TABS
325.0000 mg | ORAL_TABLET | Freq: Once | ORAL | Status: AC
  Administered 2021-06-07: 325 mg via ORAL
  Filled 2021-06-07: qty 1

## 2021-06-07 MED ORDER — PANTOPRAZOLE SODIUM 40 MG PO TBEC
40.0000 mg | DELAYED_RELEASE_TABLET | Freq: Every day | ORAL | Status: DC
  Administered 2021-06-07 – 2021-06-09 (×3): 40 mg via ORAL
  Filled 2021-06-07 (×3): qty 1

## 2021-06-07 MED ORDER — SODIUM CHLORIDE 0.9% FLUSH
3.0000 mL | Freq: Two times a day (BID) | INTRAVENOUS | Status: DC
  Administered 2021-06-07 – 2021-06-09 (×4): 3 mL via INTRAVENOUS

## 2021-06-07 MED ORDER — HYDROXYZINE HCL 25 MG PO TABS
25.0000 mg | ORAL_TABLET | Freq: Once | ORAL | Status: AC
  Administered 2021-06-07: 25 mg via ORAL
  Filled 2021-06-07: qty 1

## 2021-06-07 MED ORDER — CLOPIDOGREL BISULFATE 75 MG PO TABS
75.0000 mg | ORAL_TABLET | Freq: Every day | ORAL | Status: DC
  Administered 2021-06-07 – 2021-06-09 (×3): 75 mg via ORAL
  Filled 2021-06-07 (×3): qty 1

## 2021-06-07 MED ORDER — ONDANSETRON HCL 4 MG PO TABS
4.0000 mg | ORAL_TABLET | Freq: Four times a day (QID) | ORAL | Status: DC | PRN
Start: 2021-06-07 — End: 2021-06-09

## 2021-06-07 MED ORDER — LORAZEPAM 2 MG/ML IJ SOLN: 1.0000 mg | Freq: Four times a day (QID) | INTRAMUSCULAR | Status: DC | PRN

## 2021-06-07 MED ORDER — MECLIZINE HCL 12.5 MG PO TABS
50.0000 mg | ORAL_TABLET | Freq: Once | ORAL | Status: AC
  Administered 2021-06-07: 50 mg via ORAL
  Filled 2021-06-07: qty 4

## 2021-06-07 MED ORDER — ONDANSETRON HCL 4 MG/2ML IJ SOLN: 4.0000 mg | Freq: Four times a day (QID) | INTRAMUSCULAR | Status: DC | PRN

## 2021-06-07 MED ORDER — HYDRALAZINE HCL 20 MG/ML IJ SOLN: 10.0000 mg | INTRAMUSCULAR | Status: DC | PRN

## 2021-06-07 MED ORDER — VITAMIN B-12 100 MCG PO TABS
500.0000 ug | ORAL_TABLET | Freq: Every day | ORAL | Status: DC
  Administered 2021-06-08 – 2021-06-09 (×2): 500 ug via ORAL
  Filled 2021-06-07 (×2): qty 5

## 2021-06-07 MED ORDER — LORAZEPAM 2 MG/ML IJ SOLN
1.0000 mg | Freq: Once | INTRAMUSCULAR | Status: AC
  Administered 2021-06-07: 1 mg via INTRAVENOUS
  Filled 2021-06-07: qty 1

## 2021-06-07 MED ORDER — ENOXAPARIN SODIUM 40 MG/0.4ML IJ SOSY
40.0000 mg | PREFILLED_SYRINGE | INTRAMUSCULAR | Status: DC
  Administered 2021-06-07 – 2021-06-08 (×2): 40 mg via SUBCUTANEOUS
  Filled 2021-06-07 (×2): qty 0.4

## 2021-06-07 MED ORDER — SODIUM CHLORIDE 0.9 % IV BOLUS
500.0000 mL | Freq: Once | INTRAVENOUS | Status: AC
Start: 2021-06-07 — End: 2021-06-07
  Administered 2021-06-07: 500 mL via INTRAVENOUS

## 2021-06-07 MED ORDER — ACETAMINOPHEN 650 MG RE SUPP: 650.0000 mg | Freq: Four times a day (QID) | RECTAL | Status: DC | PRN

## 2021-06-07 MED ORDER — SODIUM CHLORIDE 0.9% FLUSH: 3.0000 mL | INTRAVENOUS | Status: DC | PRN

## 2021-06-07 MED ORDER — VITAMIN D (ERGOCALCIFEROL) 1.25 MG (50000 UNIT) PO CAPS: 50000.0000 [IU] | ORAL_CAPSULE | ORAL | Status: DC

## 2021-06-07 MED ORDER — ACETAMINOPHEN 325 MG PO TABS: 650.0000 mg | ORAL_TABLET | Freq: Four times a day (QID) | ORAL | Status: DC | PRN

## 2021-06-07 MED ORDER — HYDROXYZINE HCL 25 MG PO TABS: 25.0000 mg | ORAL_TABLET | Freq: Three times a day (TID) | ORAL | Status: DC | PRN

## 2021-06-07 MED ORDER — SODIUM CHLORIDE 0.9 % IV SOLN
1.0000 g | INTRAVENOUS | Status: DC
  Administered 2021-06-07 – 2021-06-08 (×2): 1 g via INTRAVENOUS
  Filled 2021-06-07 (×2): qty 10

## 2021-06-07 MED ORDER — INSULIN ASPART 100 UNIT/ML IJ SOLN: 0.0000 [IU] | Freq: Every day | INTRAMUSCULAR | Status: DC

## 2021-06-07 MED ORDER — ATORVASTATIN CALCIUM 40 MG PO TABS
40.0000 mg | ORAL_TABLET | Freq: Every day | ORAL | Status: DC
  Administered 2021-06-07 – 2021-06-09 (×3): 40 mg via ORAL
  Filled 2021-06-07 (×3): qty 1

## 2021-06-07 MED ORDER — ASPIRIN EC 81 MG PO TBEC
81.0000 mg | DELAYED_RELEASE_TABLET | Freq: Every day | ORAL | Status: DC
  Administered 2021-06-08 – 2021-06-09 (×2): 81 mg via ORAL
  Filled 2021-06-07 (×2): qty 1

## 2021-06-07 MED ORDER — LEVOTHYROXINE SODIUM 75 MCG PO TABS
150.0000 ug | ORAL_TABLET | Freq: Every morning | ORAL | Status: DC
  Administered 2021-06-08 – 2021-06-09 (×2): 150 ug via ORAL
  Filled 2021-06-07 (×2): qty 2

## 2021-06-07 NOTE — ED Notes (Signed)
Patient transported to CT 

## 2021-06-07 NOTE — ED Provider Notes (Signed)
[facility] EMERGENCY DEPARTMENT Provider Note   CSN: [id number] Arrival date & time: 06/07/21  1407     History Chief Complaint  Patient presents with   Dizziness    Maria Simmons is a 55 y.o. female.  HPI     55 year old female comes in a chief complaint of dizziness. Patient has history of hypothyroidism, diabetes and vertigo.  She comes in with complaints of dizziness described as spinning sensation.  She has had intermittent vertigo in the past.  This morning she woke up around 7:00 and has had constant vertigo.  She is able to walk, but feels like she is drunk.  Even at rest, her vertiginous symptoms persist although they are milder at that time.  She denies any associated nausea or vomiting.  She also denies any other focal neuro deficits such as vision change, focal numbness or weakness.  Additionally patient complains of an episode of difficulty in communicating yesterday.  She was at work and reports that when talking to her colleague or client, she was having hard time getting the right words out.  She wanted to say 1 thing but completely different verbiage came out.  No history of stroke.  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[place])    History of B-cell lymphoma 11/18/2015   Hypothyroidism    Neutropenic fever ([place]) 11/01/2014   PONV (postoperative nausea and vomiting)    during hysterectomy    S/P radiation therapy 02/05/15-02/27/15   tonsil/neck,lt subpectoral nodal bed 30.6Gy /17 fx   Thyroid cancer ([place])    Wears glasses     Patient Active Problem List   Diagnosis Date Noted   TIA (transient ischemic attack) 06/07/2021   Dysphagia 11/14/2020   Musculoskeletal disorder 10/21/2020   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    Imbalance 10/21/2020   Family history of cancer 10/21/2020   Primary osteoarthritis of right hip 09/15/2019   Other constipation 11/25/2018   Rectal bleeding 10/22/2018   Hypothyroidism 10/22/2018   Hyperglycemia 10/22/2018   Vaginal prolapse 05/03/2018   Hoarseness of voice 04/18/2018   Primary osteoarthritis of left hip 11/12/2017   H/O laparoscopic adjustable gastric banding 09/24/2017   B12 deficiency anemia 09/24/2017   Iron deficiency anemia 09/24/2017   Left hip pain 09/24/2017   Esophagitis 06/25/2016   Stye 06/25/2016   Change in bowel habits 03/19/2016   History of B-cell lymphoma 11/18/2015   Right shoulder pain 01/07/2015   Anemia in chronic illness 11/15/2014   Chronic leukopenia 11/15/2014   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   History of lymphoma 10/19/2014   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   Leucopenia 09/02/2012    Past Surgical History:  Procedure Laterality Date   ABDOMINAL HYSTERECTOMY  2000   COLONOSCOPY     COLONOSCOPY WITH PROPOFOL N/A 06/19/2016   Procedure: COLONOSCOPY WITH PROPOFOL;  Surgeon: Ronald Lobo, MD;  Location: [facility] ENDOSCOPY;  Service: Endoscopy;  Laterality: N/A;   CYSTOCELE REPAIR N/A 05/03/2018   Procedure: ANTERIOR REPAIR (CYSTOCELE);  Surgeon: Bjorn Loser, MD;  Location: [facility] ORS;  Service: Urology;  Laterality: N/A;   CYSTOSCOPY N/A 05/03/2018   Procedure: CYSTOSCOPY;  Surgeon: Bjorn Loser, MD;  Location: [facility];  Service: Urology;  Laterality: N/A;   ESOPHAGOGASTRODUODENOSCOPY (EGD) WITH PROPOFOL N/A 06/19/2016   Procedure: ESOPHAGOGASTRODUODENOSCOPY (EGD) WITH PROPOFOL;  Surgeon: Ronald Lobo, MD;  Location: [facility] ENDOSCOPY;  Service: Endoscopy;  Laterality: N/A;   LAPAROSCOPIC GASTRIC BANDING WITH HIATAL HERNIA REPAIR  05/28/2009   LYMPH NODE BIOPSY Left 10/12/2014   Procedure: excisional biopsy deep left neck lymph node;  Surgeon: Fanny Skates, MD;  Location: [place];   Service: General;  Laterality: Left;   PORT-A-CATH REMOVAL N/A 10/18/2015   Procedure: REMOVAL PORT-A-CATH;  Surgeon: Fanny Skates, MD;  Location: [facility];  Service: General;  Laterality: N/A;   PORTACATH PLACEMENT Right 10/24/2014   Procedure: INSERTION PORT-A-CATH WITH ULTRASOUND GUIDANCE;  Surgeon: Fanny Skates, MD;  Location: [place];  Service: General;  Laterality: Right;   THYROIDECTOMY  2007   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   TOTAL HIP ARTHROPLASTY Left 11/12/2017   Procedure: LEFT TOTAL HIP ARTHROPLASTY ANTERIOR APPROACH;  Surgeon: Dorna Leitz, MD;  Location: [facility];  Service: Orthopedics;  Laterality: Left;   TOTAL HIP ARTHROPLASTY Right 09/15/2019   Procedure: TOTAL HIP ARTHROPLASTY ANTERIOR APPROACH;  Surgeon: Dorna Leitz, MD;  Location: [facility];  Service: Orthopedics;  Laterality: Right;   TUBAL LIGATION  1990   VAGINAL PROLAPSE REPAIR N/A 05/03/2018   Procedure: VAULT PROLAPSE REPAIR WITH GRAFT;  Surgeon: Bjorn Loser, MD;  Location: [facility];  Service: Urology;  Laterality: N/A;     OB History     Gravida  3   Para  2   Term      Preterm      AB  1   Living  2      SAB  1   IAB      Ectopic      Multiple      Live Births              Family History  Problem Relation Age of Onset   Colon cancer Mother    Cancer Mother        colon ca   Cancer Father        prostate   Hypertension Father    Hyperlipidemia Father    Stroke Father    Prostate cancer Father    Cancer Paternal Grandmother        female organ ca    Social History   Tobacco Use   Smoking status: Never   Smokeless tobacco: Never  Vaping Use   Vaping Use: Never used  Substance Use Topics   Alcohol use: No   Drug use: No    Home Medications Prior to Admission medications   Medication Sig Start Date End Date Taking? Authorizing Provider  Dulaglutide (TRULICITY) 4.12 IN/8.6VE SOPN Inject 0.75 mg into the skin  once a week. 05/28/21  Yes   levothyroxine (SYNTHROID) 150 MCG tablet Take 1 tablet (150 mcg total) by mouth in the morning on an empty stomach 05/28/21  Yes   Multiple Vitamin (MULITIVITAMIN WITH MINERALS) TABS Take 1 tablet by mouth every morning.    Yes [provider]  polyethylene glycol (MIRALAX / GLYCOLAX) 17 g packet Take 17 g by mouth daily.   Yes [provider]  Vitamin D, Ergocalciferol, (DRISDOL) 1.25 MG (50000 UNIT) CAPS capsule Take 1 capsule (50,000 Units total) by mouth once a week. 05/28/21  Yes   Biotin 5000 MCG TABS Take 5,000 mcg by mouth daily.  Patient not taking: Reported on 06/07/2021  [provider]  Cholecalciferol (VITAMIN D) 50 MCG (2000 UT) tablet 1 tablet Patient not taking: No sig reported    [provider]  cyanocobalamin 500 MCG tablet Take 500 mcg by mouth daily with lunch.  Patient not taking: Reported on 06/07/2021    [provider]  docusate sodium (COLACE) 100 MG capsule Take 1 capsule (100 mg total) by mouth 2 (two) times daily. Patient not taking: No sig reported 09/15/19   Gary Fleet, PA-C  levothyroxine (SYNTHROID) 150 MCG tablet 1 tablet in the morning on an empty stomach Patient not taking: No sig reported 05/28/21   [provider]  levothyroxine (SYNTHROID, LEVOTHROID) 137 MCG tablet Take 137 mcg by mouth daily before breakfast. Patient not taking: Reported on 06/07/2021    [provider]  Multiple Vitamin (MULTIVITAMINS PO) See admin instructions. Patient not taking: No sig reported    [provider]  pantoprazole (PROTONIX) 40 MG tablet TAKE 1 TABLET BY MOUTH ONCE DAILY Patient not taking: No sig reported 11/26/20 11/26/21  Buccini, Herbie [place], MD  SYNTHROID 125 MCG tablet TAKE 1 TABLET BY MOUTH ONCE DAILY IN THE MORNING ON EMPTY STOMACH. Patient not taking: No sig reported 07/31/20 07/31/21  Jacelyn Pi, MD  Vitamin D, Ergocalciferol, (DRISDOL) 1.25 MG (50000 UT) CAPS  capsule Take 50,000 Units by mouth every 7 (seven) days. Patient not taking: Reported on 06/07/2021 08/17/19   [provider]    Allergies    Metformin and related, Tape, Levaquin [levofloxacin], and Penicillins  Review of Systems   Review of Systems  Constitutional:  Positive for activity change.  Respiratory:  Negative for shortness of breath.   Cardiovascular:  Negative for chest pain.  Gastrointestinal:  Negative for nausea and vomiting.  Neurological:  Positive for dizziness, speech difficulty and light-headedness. Negative for seizures, syncope, facial asymmetry, weakness, numbness and headaches.  All other systems reviewed and are negative.  Physical Exam Updated Vital Signs BP (!) 145/73   Pulse 68   Temp 98.5 F (36.9 C) (Oral)   Resp 12   Ht 5\' 4"  (1.626 m)   Wt 94.8 kg   LMP 04/13/2000 (Exact Date)   SpO2 99%   BMI 35.87 kg/m   Physical Exam Vitals and nursing note reviewed.  Constitutional:      Appearance: She is well-developed.  HENT:     Head: Atraumatic.  Eyes:     Extraocular Movements: Extraocular movements intact.     Pupils: Pupils are equal, round, and reactive to light.  Cardiovascular:     Rate and Rhythm: Normal rate.  Pulmonary:     Effort: Pulmonary effort is normal.  Musculoskeletal:     Cervical back: Normal range of motion and neck supple.  Skin:    General: Skin is warm and dry.  Neurological:     Mental Status: She is alert and oriented to person, place, and time.     Cranial Nerves: No cranial nerve deficit.     Sensory: No sensory deficit.     Motor: No weakness.     Coordination: Coordination normal.     Comments: Couldn't tolerate HINTs-but there was no clear nystagmus or head skew. Ataxic gait    ED Results / Procedures / Treatments   Labs (all labs ordered are listed, but only abnormal results are displayed) Labs Reviewed  CBC - Abnormal; Notable for the following components:      Result Value   RDW 16.1 (*)     All other  components within normal limits  COMPREHENSIVE METABOLIC PANEL - Abnormal; Notable for the following components:   Glucose, Bld 102 (*)    Creatinine, Ser 1.05 (*)    Calcium 8.8 (*)    All other components within normal limits  URINALYSIS, ROUTINE W REFLEX MICROSCOPIC - Abnormal; Notable for the following components:   Color, Urine STRAW (*)    Hgb urine dipstick SMALL (*)    Leukocytes,Ua SMALL (*)    All other components within normal limits  SARS CORONAVIRUS 2 (TAT 6-24 HRS)    EKG EKG Interpretation  Date/Time:  Saturday June 07 2021 15:48:09 EDT Ventricular Rate:  63 PR Interval:  144 QRS Duration: 100 QT Interval:  475 QTC Calculation: 487 R Axis:   22 Text Interpretation: Sinus rhythm Left ventricular hypertrophy Borderline T abnormalities, diffuse leads Borderline prolonged QT interval No acute changes Confirmed by Varney Biles [phone]) on 06/07/2021 3:55:35 PM  Radiology CT HEAD WO CONTRAST  Result Date: 06/07/2021 CLINICAL DATA:  Dizziness with nausea and vomiting. EXAM: CT HEAD WITHOUT CONTRAST TECHNIQUE: Contiguous axial images were obtained from the base of the skull through the vertex without intravenous contrast. COMPARISON:  CT maxillofacial dated June 19, 2015. FINDINGS: Brain: No evidence of acute infarction, hemorrhage, hydrocephalus, extra-axial collection or mass lesion/mass effect. Vascular: Atherosclerotic vascular calcification of the carotid siphons. No hyperdense vessel. Skull: Normal. Negative for fracture or focal lesion. Sinuses/Orbits: No acute finding. Other: None. IMPRESSION: 1. No acute intracranial abnormality. Electronically Signed   By: Titus Dubin M.D.   On: 06/07/2021 16:20    Procedures Procedures   Medications Ordered in ED Medications  LORazepam (ATIVAN) injection 1 mg (1 mg Intravenous Given 06/07/21 1552)  meclizine (ANTIVERT) tablet 50 mg (50 mg Oral Given 06/07/21 1550)  sodium chloride 0.9 % bolus 500 mL (0 mLs  Intravenous Stopped 06/07/21 1715)    ED Course  I have reviewed the triage vital signs and the nursing notes.  Pertinent labs & imaging results that were available during my care of the patient were reviewed by me and considered in my medical decision making (see chart for details).    MDM Rules/Calculators/A&P                          55 year old female comes in a chief complaint of dizziness.  She has history of diabetes, hypothyroidism.  Reports that yesterday she had an episode of aphasia, and this morning she woke up with constant vertigo.  Her vertigo right now is different than her usual vertigo which does not last as long and is not as severe.  She is able to ambulate and is ataxic.  Patient was started on Trulicity recently which could be contributing.  Neuro exam is nonfocal.  Stroke is still in the differential diagnosis.  Also, TIA is possible given that she had an episode of aphasia yesterday, and she could be having worsening of her peripheral vertigo with it.  I did discuss the case with neurologist.  They think patient should come in for a TIA work-up and recommend admission.  Final Clinical Impression(s) / ED Diagnoses Final diagnoses:  Vertigo  TIA (transient ischemic attack)  Aphasia    Rx / DC Orders ED Discharge Orders     None        Varney Biles, MD 06/07/21 1806

## 2021-06-07 NOTE — H&P (Signed)
Patient Demographics:    Maria Simmons, is a 55 y.o. female  MRN: [id number]   DOB - 11-15-1966  Admit Date - 06/07/2021  Outpatient Primary MD for the patient is Forrest Moron, MD   Assessment & Plan:    Principal Problem:   TIA (transient ischemic attack) Active Problems:   Acute lower UTI   Hx of papillary thyroid carcinoma   History of B-cell lymphoma   Hypothyroidism   1)TIA Vs CVA--- - place on telemetry monitored unit, treat empirically with aspirin 325 mg po  x 1 today, then aspirin 81 mg daily and Plavix 75 mg daily -after that continue ASA 81mg  daily only -Lipitor as ordered -Given vertigo and balance issues posterior circulation stroke 8 likely, CT notoriously not very sensitive for this, await MRI brain --MRI of the brain and MRA H and Neck pending o rule out or large Vessel occlusion or hemodynamically significant stenosis -echocardiogram with bubble study to rule out intracardiac thrombus  and to evaluate EF is pending,  In house Neurology consult pending.   PT/OT eval pending, speech eval pending, We will allow some permissive hypertension in view of possible acute stroke, avoid precipitous drop in blood pressure. Use Hydralazine 10mg  or Labetalol 10 mg iv every 4 hrs as needed for systolic blood pressure over 220 mmhg , --- check TSH, A1c and fasting lipid profile,  -# Maximize blood glucose control with insulin coverage (goal FS 60-180) #Please maintain euthermia. Tylenol prn for temp >100.4 # neuro checks  2)Disposition:- transfer to [facility] for MRI brain and MRA head and neck as well as possible in-house neurology consult  3)History of diffuse large B-cell lymphoma--- CBC WNL, follow-up with Dr. Alvy Bimler which has oncologist as previously scheduled  4)Hypothyroidism--continue  levothyroxine, check TSH  5) ??? UTI --- UA suspicious for UTI, patient with nausea and vomiting - --Give IV Rocephin pending culture data  6)Class 3 Obesity- -Low calorie diet, portion control and increase physical activity discussed with patient -Body mass index is 35.87 kg/m.  7) vertigo with persistent nausea and vomiting and unsteady gait--meclizine, antiemetics and lorazepam as needed, -Need to rule out acute stroke as above #1  Disposition/Need for in-Hospital Stay- patient unable to be discharged at this time due to -Transfer to [facility] for MRI brain and MRA head and neck as well as possible in-house neurology consult  Dispo: The patient is from: Home              Anticipated d/c is to: Home              Anticipated d/c date is: 1 day              Patient currently is not medically stable to d/c. Barriers: Not Clinically Stable-    With History of - Reviewed by me  Past Medical History:  Diagnosis Date   Arthritis of shoulder region, right, degenerative    right  shoulder    Bladder prolapse, female, acquired    Cancer ([place]) 2007   thyroid, papillary   Change in bowel habits 03/19/2016   Constipation    DLBCL (diffuse large B cell lymphoma) ([place])    History of B-cell lymphoma 11/18/2015   Hypothyroidism    Neutropenic fever ([place]) 11/01/2014   PONV (postoperative nausea and vomiting)    during hysterectomy    S/P radiation therapy 02/05/15-02/27/15   tonsil/neck,lt subpectoral nodal bed 30.6Gy /17 fx   Thyroid cancer ([place])    Wears glasses       Past Surgical History:  Procedure Laterality Date   ABDOMINAL HYSTERECTOMY  2000   COLONOSCOPY     COLONOSCOPY WITH PROPOFOL N/A 06/19/2016   Procedure: COLONOSCOPY WITH PROPOFOL;  Surgeon: Ronald Lobo, MD;  Location: [place];  Service: Endoscopy;  Laterality: N/A;   CYSTOCELE REPAIR N/A 05/03/2018   Procedure: ANTERIOR REPAIR (CYSTOCELE);  Surgeon: Bjorn Loser, MD;  Location[facility];  Service: Urology;   Laterality: N/A;   CYSTOSCOPY N/A 05/03/2018   Procedure: CYSTOSCOPY;  Surgeon: Bjorn Loser, MD;  Location[facility];  Service: Urology;  Laterality: N/A;   ESOPHAGOGASTRODUODENOSCOPY (EGD) WITH PROPOFOL N/A 06/19/2016   Procedure: ESOPHAGOGASTRODUODENOSCOPY (EGD) WITH PROPOFOL;  Surgeon: Ronald Lobo, MD;  Location: [facility];  Service: Endoscopy;  Laterality: N/A;   LAPAROSCOPIC GASTRIC BANDING WITH HIATAL HERNIA REPAIR  05/28/2009   LYMPH NODE BIOPSY Left 10/12/2014   Procedure: excisional biopsy deep left neck lymph node;  Surgeon: Fanny Skates, MD;  Location: [place];  Service: General;  Laterality: Left;   PORT-A-CATH REMOVAL N/A 10/18/2015   Procedure: REMOVAL PORT-A-CATH;  Surgeon: Fanny Skates, MD;  Location[facility];  Service: General;  Laterality: N/A;   PORTACATH PLACEMENT Right 10/24/2014   Procedure: INSERTION PORT-A-CATH WITH ULTRASOUND GUIDANCE;  Surgeon: Fanny Skates, MD;  Location: [place];  Service: General;  Laterality: Right;   THYROIDECTOMY  2007   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   TOTAL HIP ARTHROPLASTY Left 11/12/2017   Procedure: LEFT TOTAL HIP ARTHROPLASTY ANTERIOR APPROACH;  Surgeon: Dorna Leitz, MD;  Location: [facility];  Service: Orthopedics;  Laterality: Left;   TOTAL HIP ARTHROPLASTY Right 09/15/2019   Procedure: TOTAL HIP ARTHROPLASTY ANTERIOR APPROACH;  Surgeon: Dorna Leitz, MD;  [facility];  Service: Orthopedics;  Laterality: Right;   TUBAL LIGATION  1990   VAGINAL PROLAPSE REPAIR N/A 05/03/2018   Procedure: VAULT PROLAPSE REPAIR WITH GRAFT;  Surgeon: Bjorn Loser, MD;  Location[facility];  Service: Urology;  Laterality: N/A;      Chief Complaint  Patient presents with   Dizziness      HPI:    Maria Simmons  is a 55 y.o. female non-smoker with past medical history relevant for obesity, history of papillary thyroid cancer status post colectomy with resultant  postsurgical hypothyroidism, history of diffuse large B cell lymphoma and glucose intolerance as well as history of recurrent episodes of vertigo who now  presents to the ED with concerns about persistent vertigo different from her usual vertigo --Patient also had difficulty with finding the right words no dysarthria per se she just could not get the right words out when she was talking -Additional history obtained from patient's daughter at bedside -Patient complains about gait problems/ataxia, she has had persistent dizziness as well as nausea and vomiting emesis is without bile or blood -Onset of symptoms was 06/06/2021 -Denies frank chest pains or shortness of breath -EKG  sinus rhythm with LVH with no acute findings otherwise -EDP discussed case with on-call neurologist Dr. Quinn Axe -CT head without acute findings -Neurologist recommends MRI brain and MRA head and neck which are not available at [facility] over the weekend so transfer to [facility] for MRI brain and MRA head and neck as well as possible in-house neurology consult -CBC WNL, creatinine 1.0, electrolytes WNL, LFTs WNL -UA suggestive of UTI, urine culture pending No fever  Or chills , no flank pain    Review of systems:    In addition to the HPI above,   A full Review of  Systems was done, all other systems reviewed are negative except as noted above in HPI , .    Social History:  Reviewed by me    Social History   Tobacco Use   Smoking status: Never   Smokeless tobacco: Never  Substance Use Topics   Alcohol use: No    Family History :  Reviewed by me    Family History  Problem Relation Age of Onset   Colon cancer Mother    Cancer Mother        colon ca   Cancer Father        prostate   Hypertension Father    Hyperlipidemia Father    Stroke Father    Prostate cancer Father    Cancer Paternal Grandmother        female organ ca    Home Medications:   Prior to Admission medications   Medication Sig  Start Date End Date Taking? Authorizing Provider  Dulaglutide (TRULICITY) 6.25 [facility]/8.9HT SOPN Inject 0.75 mg into the skin once a week. 05/28/21  Yes   levothyroxine (SYNTHROID) 150 MCG tablet Take 1 tablet (150 mcg total) by mouth in the morning on an empty stomach 05/28/21  Yes   Multiple Vitamin (MULITIVITAMIN WITH MINERALS) TABS Take 1 tablet by mouth every morning.    Yes [provider]  polyethylene glycol (MIRALAX / GLYCOLAX) 17 g packet Take 17 g by mouth daily.   Yes [provider]  Vitamin D, Ergocalciferol, (DRISDOL) 1.25 MG (50000 UNIT) CAPS capsule Take 1 capsule (50,000 Units total) by mouth once a week. 05/28/21  Yes   Biotin 5000 MCG TABS Take 5,000 mcg by mouth daily.  Patient not taking: Reported on 06/07/2021    [provider]  Cholecalciferol (VITAMIN D) 50 MCG (2000 UT) tablet 1 tablet Patient not taking: No sig reported    [provider]  cyanocobalamin 500 MCG tablet Take 500 mcg by mouth daily with lunch.  Patient not taking: Reported on 06/07/2021    [provider]  docusate sodium (COLACE) 100 MG capsule Take 1 capsule (100 mg total) by mouth 2 (two) times daily. Patient not taking: No sig reported 09/15/19   Gary Fleet, PA-C  levothyroxine (SYNTHROID) 150 MCG tablet 1 tablet in the morning on an empty stomach Patient not taking: No sig reported 05/28/21   [provider]  levothyroxine (SYNTHROID, LEVOTHROID) 137 MCG tablet Take 137 mcg by mouth daily before breakfast. Patient not taking: Reported on 06/07/2021    [provider]  Multiple Vitamin (MULTIVITAMINS PO) See admin instructions. Patient not taking: No sig reported    [provider]  pantoprazole (PROTONIX) 40 MG tablet TAKE 1 TABLET BY MOUTH ONCE DAILY Patient not taking: No sig reported 11/26/20 11/26/21  Buccini, Herbie [place], MD  SYNTHROID 125 MCG tablet TAKE 1 TABLET BY MOUTH ONCE DAILY IN  THE MORNING ON EMPTY STOMACH. Patient not  taking: No sig reported 07/31/20 07/31/21  Jacelyn Pi, MD  Vitamin D, Ergocalciferol, (DRISDOL) 1.25 MG (50000 UT) CAPS capsule Take 50,000 Units by mouth every 7 (seven) days. Patient not taking: Reported on 06/07/2021 08/17/19   [provider]     Allergies:     Allergies  Allergen Reactions   Metformin And Related Rash    Fingers turned  Blue.   Tape Itching    Surgical tape.   Levaquin [Levofloxacin] Rash   Penicillins Rash    Has patient had a PCN reaction causing immediate rash, facial/tongue/throat swelling, SOB or lightheadedness with hypotension: No Has patient had a PCN reaction causing severe rash involving mucus membranes or skin necrosis: No Has patient had a PCN reaction that required hospitalization No Has patient had a PCN reaction occurring within the last 10 years: No If all of the above answers are "NO", then may proceed with Cephalosporin use.      Physical Exam:   Vitals  Blood pressure (!) 143/77, pulse 68, temperature 98.5 F (36.9 C), temperature source Oral, resp. rate 16, height 5\' 4"  (1.478 m), weight 94.8 kg, last menstrual period 04/13/2000, SpO2 100 %.  Physical Examination: General appearance - alert,  in no distress and  Mental status - alert, oriented to person, place, and time,  Eyes - sclera anicteric Neck - supple, no JVD elevation , Chest - clear  to auscultation bilaterally, symmetrical air movement,  Heart - S1 and S2 normal, regular  Abdomen - soft, nontender, nondistended, no masses or organomegaly Extremities - no pedal edema noted, intact peripheral pulses  Skin - warm, dry Neurologial Exam: Mental Status: Patient is awake, alert, oriented to person, place, month, year, and situation. Patient is able to give a clear and coherent history. No signs of aphasia or neglect Cranial Nerves: II: Visual Fields are full. Pupils are equal, round, and reactive to light.   III,IV, VI: EOMI without ptosis or diploplia.  V: Facial  sensation is symmetrical and wnl VII: Facial movement is symmetric.  VIII: hearing is intact to voice X: Uvula elevates symmetrically XI: Shoulder shrug is symmetric. XII: tongue is midline without atrophy or fasciculations.  Motor: Tone is normal. Bulk is normal. 5/5 strength was present in all four extremities.  Sensory: Sensation is normal to pinprick and to touch Cerebellar:  FNF without ataxia, no tremors,--gait Not tested as patient was unsteady    Data Review:    CBC Recent Labs  Lab 06/07/21 1539  WBC 4.9  HGB 12.8  HCT 40.4  PLT 323  MCV 85.2  MCH 27.0  MCHC 31.7  RDW 16.1*   ------------------------------------------------------------------------------------------------------------------  Chemistries  Recent Labs  Lab 06/07/21 1539  NA 137  K 3.7  CL 100  CO2 29  GLUCOSE 102*  BUN 15  CREATININE 1.05*  CALCIUM 8.8*  AST 23  ALT 18  ALKPHOS 70  BILITOT 0.7   ------------------------------------------------------------------------------------------------------------------ estimated creatinine clearance is 66 mL/min (A) (by C-G formula based on SCr of 1.05 mg/dL (H)). ------------------------------------------------------------------------------------------------------------------ No results for input(s): TSH, T4TOTAL, T3FREE, THYROIDAB in the last 72 hours.  Invalid input(s): FREET3   Coagulation profile No results for input(s): INR, PROTIME in the last 168 hours. ------------------------------------------------------------------------------------------------------------------- No results for input(s): DDIMER in the last 72 hours. ----------------------------------------------------------------------------------------------------------- Urinalysis    Component Value Date/Time   COLORURINE STRAW (A) 06/07/2021 [place] 06/07/2021 1539   LABSPEC 1.008 06/07/2021 1539  PHURINE 6.0 06/07/2021 1539   GLUCOSEU NEGATIVE 06/07/2021  1539   HGBUR SMALL (A) 06/07/2021 1539   BILIRUBINUR NEGATIVE 06/07/2021 1539   BILIRUBINUR N 02/18/2016 1025   KETONESUR NEGATIVE 06/07/2021 1539   PROTEINUR NEGATIVE 06/07/2021 1539   UROBILINOGEN negative 02/18/2016 1025   UROBILINOGEN 0.2 11/01/2014 1348   NITRITE NEGATIVE 06/07/2021 1539   LEUKOCYTESUR SMALL (A) 06/07/2021 1539    ----------------------------------------------------------------------------------------------------------------   Imaging Results:    CT HEAD WO CONTRAST  Result Date: 06/07/2021 CLINICAL DATA:  Dizziness with nausea and vomiting. EXAM: CT HEAD WITHOUT CONTRAST TECHNIQUE: Contiguous axial images were obtained from the base of the skull through the vertex without intravenous contrast. COMPARISON:  CT maxillofacial dated June 19, 2015. FINDINGS: Brain: No evidence of acute infarction, hemorrhage, hydrocephalus, extra-axial collection or mass lesion/mass effect. Vascular: Atherosclerotic vascular calcification of the carotid siphons. No hyperdense vessel. Skull: Normal. Negative for fracture or focal lesion. Sinuses/Orbits: No acute finding. Other: None. IMPRESSION: 1. No acute intracranial abnormality. Electronically Signed   By: Titus Dubin M.D.   On: 06/07/2021 16:20    Radiological Exams on Admission: CT HEAD WO CONTRAST  Result Date: 06/07/2021 CLINICAL DATA:  Dizziness with nausea and vomiting. EXAM: CT HEAD WITHOUT CONTRAST TECHNIQUE: Contiguous axial images were obtained from the base of the skull through the vertex without intravenous contrast. COMPARISON:  CT maxillofacial dated June 19, 2015. FINDINGS: Brain: No evidence of acute infarction, hemorrhage, hydrocephalus, extra-axial collection or mass lesion/mass effect. Vascular: Atherosclerotic vascular calcification of the carotid siphons. No hyperdense vessel. Skull: Normal. Negative for fracture or focal lesion. Sinuses/Orbits: No acute finding. Other: None. IMPRESSION: 1. No acute intracranial  abnormality. Electronically Signed   By: Titus Dubin M.D.   On: 06/07/2021 16:20    DVT Prophylaxis -SCD/lovenox AM Labs Ordered, also please review Full Orders  Family Communication: Admission, patients condition and plan of care including tests being ordered have been discussed with the patient and daughter at bedside who indicate understanding and agree with the plan   Code Status - Full Code  Likely DC to  transfer to [facility] for MRI brain and MRA head and neck as well as possible in-house neurology consult  Condition   stable  Roxan Hockey M.D on 06/07/2021 at 7:23 PM Go to [email or web address] -  for contact info  [facility] - Office  [phone]

## 2021-06-07 NOTE — Progress Notes (Addendum)
Neurology Curbside Note  Patient presented to [facility] reporting transient difficulty speaking yesterday, which resolved, and vertigo with ataxia all day today. She is outside the window for tPA or intervention therefore telestroke consult not indicated. CTH showed NAICP.  I did not see the patient over video but discussed the case with Dr. Kathrynn Humble and recommended hospitalist admission for stroke w/u.  Interim recommendations:  - Admit to hospitalist service for stroke w/u, consult in-house neurology when available - Permissive HTN x48 hrs from sx onset or until stroke ruled out by MRI goal BP <220/110. PRN labetalol or hydralazine if BP above these parameters. Avoid oral antihypertensives. - MRI brain wo contrast - MRA H&N - TTE w/ bubble - Check A1c and LDL + add statin per guidelines - ASA 325mg  now f/b 81mg  daily. Add plavix 75mg  daily x21 days if no contraindication, after that continue ASA 81mg  daily only - q4 hr neuro checks - STAT head CT for any change in neuro exam - Tele - PT/OT/SLP - Stroke education - Amb referral to neurology upon discharge   Su Monks, MD [facility] [phone]  If [place], please page neurology on call as listed in [place].

## 2021-06-07 NOTE — ED Triage Notes (Signed)
Dizziness with nausea and vomiting onset yesterday

## 2021-06-07 NOTE — ED Notes (Signed)
[facility] left with pt at 2050

## 2021-06-08 ENCOUNTER — Observation Stay (HOSPITAL_COMMUNITY): Payer: 59

## 2021-06-08 ENCOUNTER — Observation Stay (HOSPITAL_BASED_OUTPATIENT_CLINIC_OR_DEPARTMENT_OTHER): Payer: 59

## 2021-06-08 ENCOUNTER — Other Ambulatory Visit (HOSPITAL_COMMUNITY): Payer: 59

## 2021-06-08 DIAGNOSIS — E119 Type 2 diabetes mellitus without complications: Secondary | ICD-10-CM | POA: Diagnosis not present

## 2021-06-08 DIAGNOSIS — Z794 Long term (current) use of insulin: Secondary | ICD-10-CM | POA: Diagnosis not present

## 2021-06-08 DIAGNOSIS — Z85850 Personal history of malignant neoplasm of thyroid: Secondary | ICD-10-CM | POA: Diagnosis not present

## 2021-06-08 DIAGNOSIS — N390 Urinary tract infection, site not specified: Secondary | ICD-10-CM

## 2021-06-08 DIAGNOSIS — E039 Hypothyroidism, unspecified: Secondary | ICD-10-CM | POA: Diagnosis not present

## 2021-06-08 DIAGNOSIS — Z8572 Personal history of non-Hodgkin lymphomas: Secondary | ICD-10-CM

## 2021-06-08 DIAGNOSIS — R42 Dizziness and giddiness: Secondary | ICD-10-CM | POA: Diagnosis not present

## 2021-06-08 DIAGNOSIS — G459 Transient cerebral ischemic attack, unspecified: Secondary | ICD-10-CM

## 2021-06-08 DIAGNOSIS — Z20822 Contact with and (suspected) exposure to covid-19: Secondary | ICD-10-CM | POA: Diagnosis not present

## 2021-06-08 DIAGNOSIS — Z79899 Other long term (current) drug therapy: Secondary | ICD-10-CM | POA: Diagnosis not present

## 2021-06-08 LAB — GLUCOSE, CAPILLARY
Glucose-Capillary: 94 mg/dL (ref 70–99)
Glucose-Capillary: 122 mg/dL — ABNORMAL HIGH (ref 70–99)
Glucose-Capillary: 113 mg/dL — ABNORMAL HIGH (ref 70–99)

## 2021-06-08 LAB — TSH: TSH: 80.584 u[IU]/mL — ABNORMAL HIGH (ref 0.350–4.500)

## 2021-06-08 LAB — ECHOCARDIOGRAM COMPLETE
AR max vel: 2.04 cm2
AV Area VTI: 2.14 cm2
AV Area mean vel: 2.08 cm2
AV Mean grad: 4 mmHg
AV Peak grad: 6.9 mmHg
Ao pk vel: 1.31 m/s
Area-P 1/2: 3.77 cm2
Height: 64 in
S' Lateral: 2.9 cm
Weight: 3344 oz

## 2021-06-08 LAB — LIPID PANEL
Cholesterol: 274 mg/dL — ABNORMAL HIGH (ref 0–200)
HDL: 74 mg/dL (ref >40)
LDL Cholesterol: 187 mg/dL — ABNORMAL HIGH (ref 0–99)
Total CHOL/HDL Ratio: 3.7 RATIO
Triglycerides: 66 mg/dL (ref <150)
VLDL: 13 mg/dL (ref 0–40)

## 2021-06-08 LAB — BASIC METABOLIC PANEL
Anion gap: 9 (ref 5–15)
BUN: 11 mg/dL (ref 6–20)
CO2: 28 mmol/L (ref 22–32)
Calcium: 8.6 mg/dL — ABNORMAL LOW (ref 8.9–10.3)
Chloride: 101 mmol/L (ref 98–111)
Creatinine, Ser: 1.24 mg/dL — ABNORMAL HIGH (ref 0.44–1.00)
GFR, Estimated: 51 mL/min — ABNORMAL LOW (ref >60)
Glucose, Bld: 91 mg/dL (ref 70–99)
Potassium: 3.7 mmol/L (ref 3.5–5.1)
Sodium: 138 mmol/L (ref 135–145)

## 2021-06-08 LAB — CBC
HCT: 35.8 % — ABNORMAL LOW (ref 36.0–46.0)
Hemoglobin: 11.2 g/dL — ABNORMAL LOW (ref 12.0–15.0)
MCH: 26.2 pg (ref 26.0–34.0)
MCHC: 31.3 g/dL (ref 30.0–36.0)
MCV: 83.8 fL (ref 80.0–100.0)
Platelets: 279 10*3/uL (ref 150–400)
RBC: 4.27 MIL/uL (ref 3.87–5.11)
RDW: 16.1 % — ABNORMAL HIGH (ref 11.5–15.5)
WBC: 3.7 10*3/uL — ABNORMAL LOW (ref 4.0–10.5)
nRBC: 0 % (ref 0.0–0.2)

## 2021-06-08 LAB — HIV ANTIBODY (ROUTINE TESTING W REFLEX): HIV Screen 4th Generation wRfx: NONREACTIVE

## 2021-06-08 LAB — SARS CORONAVIRUS 2 (TAT 6-24 HRS): SARS Coronavirus 2: NEGATIVE

## 2021-06-08 NOTE — TOC Transition Note (Addendum)
Transition of Care [facility]) - CM/SW Discharge Note   Patient Details  Name: Maria Simmons MRN: [id number] Date of Birth: 1966-02-29  [facility] CM/SW Contact:  Carles Collet, RN Phone Number: 06/08/2021, 4:26 PM   Clinical Narrative:   [name] w patient. She states that she has 3/1 and RW at home. She has had hip replacements and cared for her mother, because of this she has multiple pieces of DME at home; she does not need any DME.  She is agreeable to referrals for PT OT [facility] Neuro. Referrals made through Epic, numbers added to AVS. She has been instructed to call to make an appointment.    Final next level of care: Home/Self Care Barriers to Discharge: No Barriers Identified   Patient Goals and CMS Choice        Discharge Placement                       Discharge Plan and Services                DME Arranged: N/A                    Social Determinants of Health (SDOH) Interventions     Readmission Risk Interventions No flowsheet data found.

## 2021-06-08 NOTE — Evaluation (Addendum)
Physical Therapy Evaluation Patient Details Name: Maria Simmons MRN: [id number] DOB: 18-Jul-1966 Today's Date: 06/08/2021   History of Present Illness  55 year old female comes in 06/07/21 with chief complaint of dizziness/spinning sensation and episode of difficulty communicating. 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  Pt currently requires up to moderate assist for dynamic balance (especially when turning to her right). May benefit from use of RW short-term.  Anticipate patient will benefit from PT to address problems listed below.Will continue to follow acutely to maximize functional mobility independence and safety.       Follow Up Recommendations Outpatient PT;Supervision for mobility/OOB (OP Neuro rehab)    Equipment Recommendations  Rolling walker with 5" wheels (continue to assess)    Recommendations for Other Services       Precautions / Restrictions Precautions Precautions: Fall      Mobility  Bed Mobility Overal bed mobility: Independent                  Transfers Overall transfer level: Needs assistance   Transfers: Sit to/from Stand Sit to Stand: Supervision         General transfer comment: due to recent vertigo  Ambulation/Gait Ambulation/Gait assistance: Mod assist Gait Distance (Feet): 180 Feet Assistive device: 1 person hand held assist Gait Pattern/deviations: Step-through pattern;Antalgic;Drifts right/left;Staggering left Gait velocity: able to vary up in limited capacity; can decr WNL Gait velocity interpretation: 1.31 - 2.62 ft/sec, indicative of limited community ambulator General Gait  Details: pt with bil hip replacements with slight antalgic vs trendelenburg gait pattern; with head turns pt drifts, with 180 degree turn to right pt with staggering LOB to her left required mod assist to recover  Stairs            Wheelchair Mobility    Modified Rankin (Stroke Patients Only) Modified Rankin (Stroke Patients Only) Pre-Morbid Rankin Score: No symptoms Modified Rankin: Moderately severe disability     Balance Overall balance assessment: Needs assistance   Sitting balance-Leahy Scale: Good       Standing balance-Leahy Scale: Fair Standing balance comment: LOB with head turns and turning to her rt              06/08/21 1046  Vestibular Assessment  General Observation supine in bed; head slightly tilted to her left  Symptom Behavior  Subjective history of current problem severe vertigo began 2 nights ago; required assist to ambulate to prevent fall; constant but has now subsided except when she is moving  Type of Dizziness  Spinning  Duration of Dizziness while moving, persists  Symptom Nature Motion provoked (was constant)  Aggravating Factors Activity in general  Relieving Factors Head stationary  Progression of Symptoms Better  History of similar episodes none this severe that lasted this long  Oculomotor Exam  Oculomotor Alignment Normal  Ocular ROM WNL  Spontaneous Absent  Gaze-induced  Absent  Smooth Pursuits Saccades  Saccades Dysmetria  Comment saccades with several "hops" to attain target, especially rt eye  Vestibulo-Ocular Reflex  VOR Cancellation Corrective saccades  Other Tests  Comments Test of skew negative  Pertinent Vitals/Pain Pain Assessment: No/denies pain    Home Living Family/patient expects to be discharged to:: Private residence Living Arrangements: Children Available Help at Discharge: Available 24 hours/day Type of Home: House Home Access: Stairs to enter;Level entry Entrance  Stairs-Rails: Left Entrance Stairs-Number of Steps: 13 steps to basement Home Layout: Able to live on main level with bedroom/bathroom Home Equipment: Cane - single point;Walker - 2 wheels;Walker - 4 wheels      Prior Function Level of Independence: Independent         Comments: involved with grandchildren; I with IADL tasks; works with [employer] but thought another payment system - work invovlves ins/pt claims with [employer] pts - works from home priarily on computer/phone     Hand Dominance   Dominant Hand: Right    Extremity/Trunk Assessment   Upper Extremity Assessment Upper Extremity Assessment: RUE deficits/detail RUE Deficits / Details: mild incoordination with finger to nose RUE Coordination:  (using functionally)    Lower Extremity Assessment Lower Extremity Assessment: RLE deficits/detail RLE Deficits / Details: ROM and strength WFL RLE Coordination: decreased fine motor (rapid alternating movements decreased; heel to shin in sitting WFL)    Cervical / Trunk Assessment Cervical / Trunk Assessment: Normal  Communication   Communication: Expressive difficulties  Cognition Arousal/Alertness: Awake/alert Behavior During Therapy: WFL for tasks assessed/performed Overall Cognitive Status:  (need to further assess higher level executive level skills)                                 General Comments: reports she feels her head is cloudy and not quite right      General Comments General comments (skin integrity, edema, etc.): Pt aware she is not at baseline; unsteady with dynamic tasks and reaching for support    Exercises     Assessment/Plan    PT Assessment Patient needs continued PT services  PT Problem List Decreased balance;Decreased mobility;Decreased coordination;Decreased knowledge of use of DME       PT Treatment Interventions      PT Goals (Current goals can be found in the Care Plan section)  Acute Rehab PT Goals Patient Stated Goal: to find  out what is causing this feeling PT Goal Formulation: With patient Time For Goal Achievement: 06/22/21 Potential to Achieve Goals: Good    Frequency Min 4X/week   Barriers to discharge        Co-evaluation PT/OT/SLP Co-Evaluation/Treatment: Yes Reason for Co-Treatment: Complexity of the patient's impairments (multi-system involvement);For patient/therapist safety PT goals addressed during session: Mobility/safety with mobility;Balance OT goals addressed during session: ADL's and self-care       AM-PAC PT "6 Clicks" Mobility  Outcome Measure Help needed turning from your back to your side while in a flat bed without using bedrails?: None Help needed moving from lying on your back to sitting on the side of a flat bed without using bedrails?: None Help needed moving to and from a bed to a chair (including a wheelchair)?: A Little Help needed standing up from a chair using your arms (e.g., wheelchair or bedside chair)?: A Little Help needed to walk in hospital room?: A Lot Help needed climbing 3-5 steps with a railing? : A Little 6 Click Score: 19    End of Session Equipment Utilized During Treatment: Gait belt Activity Tolerance: Patient tolerated treatment well Patient left: in chair;with call bell/phone within reach;with chair alarm set Nurse Communication: Mobility status PT Visit Diagnosis:  Unsteadiness on feet (R26.81);Dizziness and giddiness (R42)    Time: 2481-8590 PT Time Calculation (min) (ACUTE ONLY): 33 min   Charges:   PT Evaluation $PT Eval Moderate Complexity: 1 Mod           Arby Barrette, PT Pager [phone]   Rexanne Mano 06/08/2021, 10:51 AM

## 2021-06-08 NOTE — Progress Notes (Addendum)
Occupational Therapy Evaluation Patient Details Name: Maria Simmons MRN: [id number] DOB: August 18, 1966 Today's Date: 06/08/2021    History of Present Illness 55 year old female comes in 06/07/21 with chief complaint of dizziness/spinning sensation and episode of difficulty communicating. MRI/MRA brain negative for acute changes   PMH intermittent vertigo, arthritis Rt shoulder, bil THA, lab band 2010, bladder prolapse, lymphoma, thyroid cancer with thyroidectomy   Clinical Impression   PTA pt lives with family and is independent with ADL/IADL tasks and mobility. 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 Pt appears to demonstrate mild ataxia RUE with finger to nose and continues to demonstrate difficulty with balance in dynamic positions. Pt complaining of word finding deficits at times - recommend ST cog/language eval. Recommend follow up with OT at the neuro outpt center to maximize functional level of independence and return to work. Will follow acutely.     Follow Up Recommendations  Outpatient OT (neuro); Refrain from driving at this time   Equipment Recommendations  3 in 1 bedside commode (to use as shower chair)    Recommendations for Other Services  ST cog/language eval     Precautions / Restrictions Precautions Precautions: Fall      Mobility Bed Mobility Overal bed mobility: Independent                  Transfers Overall transfer level: Needs assistance   Transfers: Sit to/from Stand Sit to Stand: Supervision              Balance Overall balance assessment: Needs assistance   Sitting balance-Leahy Scale: Good       Standing balance-Leahy Scale: Fair Standing balance comment: LOB with head turns                           ADL either performed or assessed with clinical judgement   ADL Overall ADL's : Needs assistance/impaired                                     Functional mobility during  ADLs: Minimal assistance General ADL Comments: overall set up with sitting level ADL tasks; minguard for sit - stand due to associated dizziness with movement, especially separation of eye/head movement     Vision Baseline Vision/History: Wears glasses Wears Glasses: Distance only Patient Visual Report: No change from baseline Vision Assessment?: Yes Eye Alignment: Within Functional Limits Ocular Range of Motion: Within Functional Limits Alignment/Gaze Preference: Within Defined Limits Tracking/Visual Pursuits: Decreased smoothness of horizontal tracking Saccades: Additional eye shifts occurred during testing Convergence: Within functional limits Visual Fields: No apparent deficits     Agricultural engineer Tested?:  (WFL)   Praxis Praxis Praxis tested?: Within functional limits    Pertinent Vitals/Pain Pain Assessment: No/denies pain     Hand Dominance Right   Extremity/Trunk Assessment Upper Extremity Assessment Upper Extremity Assessment: RUE deficits/detail RUE Deficits / Details: mild incoordination with finger to nose RUE Coordination:  (using functionally)   Lower Extremity Assessment Lower Extremity Assessment: Defer to PT evaluation   Cervical / Trunk Assessment Cervical / Trunk Assessment: Normal   Communication Communication Communication: Expressive difficulties   Cognition Arousal/Alertness: Awake/alert Behavior During Therapy: WFL for tasks assessed/performed Overall Cognitive Status:  (need to further assess higher level executive level skills)  General Comments  Pt aware she is not at baseline; unsteady with dynamic tasks and reaching for support; complaining of having a feeling of "chest tightness/muscle pain" at times since Covid; 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    Exercises     Shoulder Instructions      Home Living Family/patient expects to be discharged  to:: Private residence Living Arrangements: Children Available Help at Discharge: Available 24 hours/day Type of Home: House Home Access: Stairs to enter;Level entry Entrance Stairs-Number of Steps: 13 steps to basement Entrance Stairs-Rails: Left Home Layout: Able to live on main level with bedroom/bathroom     Bathroom Shower/Tub: Occupational psychologist: Handicapped height Bathroom Accessibility: Yes How Accessible: Accessible via walker Home Equipment: [place] - single point;Walker - 2 wheels;Walker - 4 wheels          Prior Functioning/Environment Level of Independence: Independent        Comments: involved with grandchildren; I with IADL tasks; works with [employer] but thought another payment system - work invovlves ins/pt claims with [employer] pts - works from home priarily on computer/phone        OT Problem List: Impaired balance (sitting and/or standing);Decreased safety awareness;Decreased knowledge of use of DME or AE      OT Treatment/Interventions: Self-care/ADL training;Therapeutic exercise;Neuromuscular education;DME and/or AE instruction;Therapeutic activities;Patient/family education;Balance training    OT Goals(Current goals can be found in the care plan section) Acute Rehab OT Goals Patient Stated Goal: to find out what is causing this feeling OT Goal Formulation: With patient Time For Goal Achievement: 06/22/21 Potential to Achieve Goals: Good  OT Frequency: Min 2X/week   Barriers to D/C:            Co-evaluation PT/OT/SLP Co-Evaluation/Treatment: Yes Reason for Co-Treatment: For patient/therapist safety   OT goals addressed during session: ADL's and self-care      AM-PAC OT "6 Clicks" Daily Activity     Outcome Measure Help from another person eating meals?: None Help from another person taking care of personal grooming?: A Little Help from another person toileting, which includes using toliet, bedpan, or urinal?: A Little Help from another  person bathing (including washing, rinsing, drying)?: A Little Help from another person to put on and taking off regular upper body clothing?: A Little Help from another person to put on and taking off regular lower body clothing?: A Little 6 Click Score: 19   End of Session Equipment Utilized During Treatment: Gait belt Nurse Communication: Mobility status  Activity Tolerance: Patient tolerated treatment well Patient left: in chair;with call bell/phone within reach;with chair alarm set  OT Visit Diagnosis: Unsteadiness on feet (R26.81);Other abnormalities of gait and mobility (R26.89);Ataxia, unspecified (R27.0);Dizziness and giddiness (R42)                Time: 3545-6256 OT Time Calculation (min): 33 min Charges:  OT General Charges $OT Visit: 1 Visit OT Evaluation $OT Eval Moderate Complexity: [place], OT/L   Acute OT Clinical Specialist Acute Rehabilitation Services Pager [phone] Office [phone]   [facility] 06/08/2021, 10:39 AM

## 2021-06-08 NOTE — Progress Notes (Signed)
PROGRESS NOTE    Maria Simmons  XIP:[id number] DOB: August 25, 1966 DOA: 06/07/2021 PCP: Forrest Moron, MD   Brief Narrative:  Per admitting MD:  Maria Simmons  is a 55 y.o. 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, history of diffuse large B cell lymphoma and glucose intolerance as well as history of recurrent episodes of vertigo who now  presents to the ED with concerns about persistent vertigo different from her usual vertigo --Patient also had difficulty with finding the right words no dysarthria per se she just could not get the right words out when she was talking -Additional history obtained from patient's daughter at bedside -Patient complains about gait problems/ataxia, she has had persistent dizziness as well as nausea and vomiting emesis is without bile or blood -Onset of symptoms was 06/06/2021 -Denies frank chest pains or shortness of breath -EKG sinus rhythm with LVH with no acute findings otherwise -EDP discussed case with on-call neurologist Dr. Quinn Axe -CT head without acute findings -Neurologist recommends MRI brain and MRA head and neck which are not available at [facility] over the weekend so transfer to [facility] for MRI brain and MRA head and neck as well as possible in-house neurology consult -CBC WNL, creatinine 1.0, electrolytes WNL, LFTs WNL -UA suggestive of UTI, urine culture pending No fever  Or chills , no flank pain   Assessment & Plan:   Principal Problem:   TIA (transient ischemic attack) Active Problems:   Hx of papillary thyroid carcinoma   History of B-cell lymphoma   Hypothyroidism   Acute lower UTI   TIA Vs CVA -placed on telemetry monitored unit, treat empirically with aspirin 325 mg po  x 1 today, then aspirin 81 mg daily and Plavix 75 mg daily -after that continue ASA 81mg  daily only -Lipitor as ordered, 40MG   DAILY -LIPID PANEL PENDING/NOT YET COLLECTED -Given vertigo and balance issues posterior circulation stroke 8 likely, CT notoriously not very sensitive for this, await MRI brain --MRI of the brain and MRA H and Neck pending o rule out or large Vessel occlusion or hemodynamically significant stenosis, unfortunately neck not done -echocardiogram with bubble study to rule out intracardiac thrombus  and to evaluate EF -this remains pending, -In house Neurology consult pending. -PT/OT RECS FOR ROLLING WALKER , BEDSIDE COMMODE AND HHPT -check TSH, A1c and fasting lipid profile,ALL PENDING -transferred to [facility] from [facility] fro above work-up    History of diffuse large B-cell lymphoma -CBC WNL,  -follow-up with Dr. Alvy Bimler which has oncologist as previously scheduled   Hypothyroidism -continue levothyroxine,  -order TSH   ??? UTI -UA suspicious for UTI, patient with nausea and vomiting - -was started om IV Rocephin  -pending culture data   Class 3 Obesity- -Low calorie diet, portion control and increase physical activity discussed with patient -Body mass index is 35.87 kg/m.   vertigo  -persistent nausea and vomiting and unsteady gait -meclizine, antiemetics and lorazepam as needed, -Need to rule out acute stroke as above #1     DVT prophylaxis: Lovenox SQ  Code Status: FULL    Code Status Orders  (From admission, onward)           Start     Ordered   06/07/21 1920  Full code  Continuous        06/07/21 1921           Code Status History  Date Active Date Inactive Code Status Order ID Comments User Context   09/15/2019 1031 09/16/2019 1531 Full Code [id number]  [name] Inpatient   10/22/2018 2126 10/24/2018 1840 Full Code [id number]  [name], MD ED   05/03/2018 1249 05/04/2018 1542 Full Code [id number]  [name], MD Inpatient   11/12/2017 1159 11/13/2017 1643 Full Code [id number]  [name] Inpatient   10/24/2014 1006 10/25/2014 0336  Full Code [id number]  [name], [name], MD HOV      Advance Directive Documentation    Flowsheet Row Most Recent Value  Type of Advance Directive Living will  Pre-existing out of facility DNR order (yellow form or pink MOST form) --  "MOST" Form in Place? --      Family Communication: DAUGHTER AT BEDSIDE  Disposition Plan:   PT TO REMAIN INHOPSITLA FOR FURTHER WORKUP AND EVAL WITH US/, NEURO CONSULT, CONTINUED INTENSIVE NEURO MONITORING.  PT IS A HI FALL RISK AND NOT STABLE FOR DISCHARGE Consults called:  NEURO Admission status: Observation   Consultants:  AS ABOVE  Procedures:  CT HEAD WO CONTRAST  Result Date: 06/07/2021 CLINICAL DATA:  Dizziness with nausea and vomiting. EXAM: CT HEAD WITHOUT CONTRAST TECHNIQUE: Contiguous axial images were obtained from the base of the skull through the vertex without intravenous contrast. COMPARISON:  CT maxillofacial dated June 19, 2015. FINDINGS: Brain: No evidence of acute infarction, hemorrhage, hydrocephalus, extra-axial collection or mass lesion/mass effect. Vascular: Atherosclerotic vascular calcification of the carotid siphons. No hyperdense vessel. Skull: Normal. Negative for fracture or focal lesion. Sinuses/Orbits: No acute finding. Other: None. IMPRESSION: 1. No acute intracranial abnormality. Electronically Signed   By: Titus Dubin M.D.   On: 06/07/2021 16:20   MR ANGIO HEAD WO CONTRAST  Result Date: 06/08/2021 CLINICAL DATA:  Initial evaluation for acute TIA, dizziness. EXAM: MRI HEAD WITHOUT CONTRAST MRA HEAD WITHOUT CONTRAST TECHNIQUE: Multiplanar, multi-echo pulse sequences of the brain and surrounding structures were acquired without intravenous contrast. Angiographic images of the Circle of Willis were acquired using MRA technique without intravenous contrast. COMPARISON: No pertinent prior exam. COMPARISON:  Prior head CT from 06/07/2021. FINDINGS: MRI HEAD FINDINGS Brain: Cerebral volume within normal limits for patient age. No  focal parenchymal signal abnormality identified. No abnormal foci of restricted diffusion to suggest acute or subacute ischemia. Gray-white matter differentiation well maintained. No encephalomalacia to suggest chronic infarction. No foci of susceptibility artifact to suggest acute or chronic intracranial hemorrhage. No mass lesion, midline shift or mass effect. No hydrocephalus. No extra-axial fluid collection. Major dural sinuses are grossly patent. Pituitary gland and suprasellar region are normal. Midline structures intact and normal. Vascular: Major intracranial vascular flow voids well maintained and normal in appearance. Skull and upper cervical spine: Craniocervical junction normal. Visualized upper cervical spine within normal limits. Bone marrow signal intensity normal. No scalp soft tissue abnormality. Sinuses/Orbits: Globes and orbital soft tissues within normal limits. Mild scattered mucosal thickening noted within the sphenoid ethmoidal and maxillary sinuses. No mastoid effusion. Inner ear structures grossly normal. Other: None. MRA HEAD FINDINGS Anterior circulation: Both internal carotid arteries widely patent to the termini without stenosis. A1 segments widely patent. Normal anterior communicating artery complex. Both anterior cerebral arteries widely patent to their distal aspects without stenosis. No M1 stenosis or occlusion. Normal MCA bifurcations. Distal MCA branches well perfused and symmetric. Posterior circulation: Both V4 segments patent to the vertebrobasilar junction without stenosis. Both PICA origins patent and normal. Basilar widely patent to its distal aspect without  stenosis. Superior cerebellar arteries patent bilaterally. Both PCAs primarily supplied via the basilar and are well perfused to there distal aspects. Anatomic variants: None significant.  No aneurysm. IMPRESSION: 1. Normal brain MRI. No acute intracranial abnormality identified. 2. Normal intracranial MRA. Electronically  Signed   By: Jeannine Boga M.D.   On: 06/08/2021 02:32   MR BRAIN WO CONTRAST  Result Date: 06/08/2021 CLINICAL DATA:  Initial evaluation for acute TIA, dizziness. EXAM: MRI HEAD WITHOUT CONTRAST MRA HEAD WITHOUT CONTRAST TECHNIQUE: Multiplanar, multi-echo pulse sequences of the brain and surrounding structures were acquired without intravenous contrast. Angiographic images of the Circle of Willis were acquired using MRA technique without intravenous contrast. COMPARISON: No pertinent prior exam. COMPARISON:  Prior head CT from 06/07/2021. FINDINGS: MRI HEAD FINDINGS Brain: Cerebral volume within normal limits for patient age. No focal parenchymal signal abnormality identified. No abnormal foci of restricted diffusion to suggest acute or subacute ischemia. Gray-white matter differentiation well maintained. No encephalomalacia to suggest chronic infarction. No foci of susceptibility artifact to suggest acute or chronic intracranial hemorrhage. No mass lesion, midline shift or mass effect. No hydrocephalus. No extra-axial fluid collection. Major dural sinuses are grossly patent. Pituitary gland and suprasellar region are normal. Midline structures intact and normal. Vascular: Major intracranial vascular flow voids well maintained and normal in appearance. Skull and upper cervical spine: Craniocervical junction normal. Visualized upper cervical spine within normal limits. Bone marrow signal intensity normal. No scalp soft tissue abnormality. Sinuses/Orbits: Globes and orbital soft tissues within normal limits. Mild scattered mucosal thickening noted within the sphenoid ethmoidal and maxillary sinuses. No mastoid effusion. Inner ear structures grossly normal. Other: None. MRA HEAD FINDINGS Anterior circulation: Both internal carotid arteries widely patent to the termini without stenosis. A1 segments widely patent. Normal anterior communicating artery complex. Both anterior cerebral arteries widely patent to  their distal aspects without stenosis. No M1 stenosis or occlusion. Normal MCA bifurcations. Distal MCA branches well perfused and symmetric. Posterior circulation: Both V4 segments patent to the vertebrobasilar junction without stenosis. Both PICA origins patent and normal. Basilar widely patent to its distal aspect without stenosis. Superior cerebellar arteries patent bilaterally. Both PCAs primarily supplied via the basilar and are well perfused to there distal aspects. Anatomic variants: None significant.  No aneurysm. IMPRESSION: 1. Normal brain MRI. No acute intracranial abnormality identified. 2. Normal intracranial MRA. Electronically Signed   By: Jeannine Boga M.D.   On: 06/08/2021 02:32    Antimicrobials:  CTX DAY 2    Subjective: NO ACUTE EVENTS OVERNIGHT PLEASANT AND COOPERATIVE STILL WITH WEAKNESS AND INSTABILITY PER PT  Objective: Vitals:   06/08/21 0002 06/08/21 0345 06/08/21 0754 06/08/21 1207  BP: (!) 99/52 117/68 94/63 (!) 139/99  Pulse: 63 62 75 82  Resp: 16 17 16 18   Temp: 98.3 F (36.8 C) 98 F (36.7 C) 98 F (36.7 C) 98.3 F (36.8 C)  TempSrc: Oral Oral Oral Oral  SpO2: 100% 99% 100% 100%  Weight:      Height:        Intake/Output Summary (Last 24 hours) at 06/08/2021 1321 Last data filed at 06/08/2021 0322 Gross per 24 hour  Intake 2100 ml  Output --  Net 2100 ml   Filed Weights   06/07/21 1459  Weight: 94.8 kg    Examination:  General exam: Appears calm and comfortable  Respiratory system: Clear to auscultation. Respiratory effort normal. Cardiovascular system: S1 & S2 heard, RRR. No JVD, murmurs, rubs, gallops or clicks. No pedal edema. Gastrointestinal  system: Abdomen is nondistended, soft and nontender. No organomegaly or masses felt. Normal bowel sounds heard. Central nervous system: Alert and oriented. UNSTEADY GAIT, THOUGH IMPROVING PER PT Extremities: WWP, NO EDEMA Skin: No rashes, lesions or ulcers Psychiatry: Judgement and insight  appear normal. Mood & affect appropriate.     Data Reviewed: I have personally reviewed following labs and imaging studies  CBC: Recent Labs  Lab 06/07/21 1539  WBC 4.9  HGB 12.8  HCT 40.4  MCV 85.2  PLT 416   Basic Metabolic Panel: Recent Labs  Lab 06/07/21 1539  NA 137  K 3.7  CL 100  CO2 29  GLUCOSE 102*  BUN 15  CREATININE 1.05*  CALCIUM 8.8*   GFR: Estimated Creatinine Clearance: 66 mL/min (A) (by C-G formula based on SCr of 1.05 mg/dL (H)). Liver Function Tests: Recent Labs  Lab 06/07/21 1539  AST 23  ALT 18  ALKPHOS 70  BILITOT 0.7  PROT 7.7  ALBUMIN 4.0   No results for input(s): LIPASE, AMYLASE in the last 168 hours. No results for input(s): AMMONIA in the last 168 hours. Coagulation Profile: No results for input(s): INR, PROTIME in the last 168 hours. Cardiac Enzymes: No results for input(s): CKTOTAL, CKMB, CKMBINDEX, TROPONINI in the last 168 hours. BNP (last 3 results) No results for input(s): PROBNP in the last 8760 hours. HbA1C: No results for input(s): HGBA1C in the last 72 hours. CBG: Recent Labs  Lab 06/07/21 2205 06/08/21 0604  GLUCAP 93 94   Lipid Profile: No results for input(s): CHOL, HDL, LDLCALC, TRIG, CHOLHDL, LDLDIRECT in the last 72 hours. Thyroid Function Tests: No results for input(s): TSH, T4TOTAL, FREET4, T3FREE, THYROIDAB in the last 72 hours. Anemia Panel: No results for input(s): VITAMINB12, FOLATE, FERRITIN, TIBC, IRON, RETICCTPCT in the last 72 hours. Sepsis Labs: No results for input(s): PROCALCITON, LATICACIDVEN in the last 168 hours.  Recent Results (from the past 240 hour(s))  SARS CORONAVIRUS 2 (TAT 6-24 HRS) Nasopharyngeal Nasopharyngeal Swab     Status: None   Collection Time: 06/07/21  5:05 PM   Specimen: Nasopharyngeal Swab  Result Value Ref Range Status   SARS Coronavirus 2 NEGATIVE NEGATIVE Final    Comment: (NOTE) SARS-CoV-2 target nucleic acids are NOT DETECTED.  The SARS-CoV-2 RNA is  generally detectable in upper and lower respiratory specimens during the acute phase of infection. Negative results do not preclude SARS-CoV-2 infection, do not rule out co-infections with other pathogens, and should not be used as the sole basis for treatment or other patient management decisions. Negative results must be combined with clinical observations, patient history, and epidemiological information. The expected result is Negative.  Fact Sheet for Patients: SugarRoll.be  Fact Sheet for Healthcare Providers: https://www.woods-mathews.com/  This test is not yet approved or cleared by the [place] FDA and  has been authorized for detection and/or diagnosis of SARS-CoV-2 by FDA under an Emergency Use Authorization (EUA). This EUA will remain  in effect (meaning this test can be used) for the duration of the COVID-19 declaration under Se ction 564(b)(1) of the Act, 21 U.S.C. section 360bbb-3(b)(1), unless the authorization is terminated or revoked sooner.  Performed at [facility], [place] [address] [place], [place] [address]          Radiology Studies: CT HEAD WO CONTRAST  Result Date: 06/07/2021 CLINICAL DATA:  Dizziness with nausea and vomiting. EXAM: CT HEAD WITHOUT CONTRAST TECHNIQUE: Contiguous axial images were obtained from the base of the skull through the  vertex without intravenous contrast. COMPARISON:  CT maxillofacial dated June 19, 2015. FINDINGS: Brain: No evidence of acute infarction, hemorrhage, hydrocephalus, extra-axial collection or mass lesion/mass effect. Vascular: Atherosclerotic vascular calcification of the carotid siphons. No hyperdense vessel. Skull: Normal. Negative for fracture or focal lesion. Sinuses/Orbits: No acute finding. Other: None. IMPRESSION: 1. No acute intracranial abnormality. Electronically Signed   By: Titus Dubin M.D.   On: 06/07/2021 16:20   MR ANGIO HEAD WO CONTRAST  Result  Date: 06/08/2021 CLINICAL DATA:  Initial evaluation for acute TIA, dizziness. EXAM: MRI HEAD WITHOUT CONTRAST MRA HEAD WITHOUT CONTRAST TECHNIQUE: Multiplanar, multi-echo pulse sequences of the brain and surrounding structures were acquired without intravenous contrast. Angiographic images of the Circle of Willis were acquired using MRA technique without intravenous contrast. COMPARISON: No pertinent prior exam. COMPARISON:  Prior head CT from 06/07/2021. FINDINGS: MRI HEAD FINDINGS Brain: Cerebral volume within normal limits for patient age. No focal parenchymal signal abnormality identified. No abnormal foci of restricted diffusion to suggest acute or subacute ischemia. Gray-white matter differentiation well maintained. No encephalomalacia to suggest chronic infarction. No foci of susceptibility artifact to suggest acute or chronic intracranial hemorrhage. No mass lesion, midline shift or mass effect. No hydrocephalus. No extra-axial fluid collection. Major dural sinuses are grossly patent. Pituitary gland and suprasellar region are normal. Midline structures intact and normal. Vascular: Major intracranial vascular flow voids well maintained and normal in appearance. Skull and upper cervical spine: Craniocervical junction normal. Visualized upper cervical spine within normal limits. Bone marrow signal intensity normal. No scalp soft tissue abnormality. Sinuses/Orbits: Globes and orbital soft tissues within normal limits. Mild scattered mucosal thickening noted within the sphenoid ethmoidal and maxillary sinuses. No mastoid effusion. Inner ear structures grossly normal. Other: None. MRA HEAD FINDINGS Anterior circulation: Both internal carotid arteries widely patent to the termini without stenosis. A1 segments widely patent. Normal anterior communicating artery complex. Both anterior cerebral arteries widely patent to their distal aspects without stenosis. No M1 stenosis or occlusion. Normal MCA bifurcations.  Distal MCA branches well perfused and symmetric. Posterior circulation: Both V4 segments patent to the vertebrobasilar junction without stenosis. Both PICA origins patent and normal. Basilar widely patent to its distal aspect without stenosis. Superior cerebellar arteries patent bilaterally. Both PCAs primarily supplied via the basilar and are well perfused to there distal aspects. Anatomic variants: None significant.  No aneurysm. IMPRESSION: 1. Normal brain MRI. No acute intracranial abnormality identified. 2. Normal intracranial MRA. Electronically Signed   By: Jeannine Boga M.D.   On: 06/08/2021 02:32   MR BRAIN WO CONTRAST  Result Date: 06/08/2021 CLINICAL DATA:  Initial evaluation for acute TIA, dizziness. EXAM: MRI HEAD WITHOUT CONTRAST MRA HEAD WITHOUT CONTRAST TECHNIQUE: Multiplanar, multi-echo pulse sequences of the brain and surrounding structures were acquired without intravenous contrast. Angiographic images of the Circle of Willis were acquired using MRA technique without intravenous contrast. COMPARISON: No pertinent prior exam. COMPARISON:  Prior head CT from 06/07/2021. FINDINGS: MRI HEAD FINDINGS Brain: Cerebral volume within normal limits for patient age. No focal parenchymal signal abnormality identified. No abnormal foci of restricted diffusion to suggest acute or subacute ischemia. Gray-white matter differentiation well maintained. No encephalomalacia to suggest chronic infarction. No foci of susceptibility artifact to suggest acute or chronic intracranial hemorrhage. No mass lesion, midline shift or mass effect. No hydrocephalus. No extra-axial fluid collection. Major dural sinuses are grossly patent. Pituitary gland and suprasellar region are normal. Midline structures intact and normal. Vascular: Major intracranial vascular flow voids well maintained and  normal in appearance. Skull and upper cervical spine: Craniocervical junction normal. Visualized upper cervical spine within  normal limits. Bone marrow signal intensity normal. No scalp soft tissue abnormality. Sinuses/Orbits: Globes and orbital soft tissues within normal limits. Mild scattered mucosal thickening noted within the sphenoid ethmoidal and maxillary sinuses. No mastoid effusion. Inner ear structures grossly normal. Other: None. MRA HEAD FINDINGS Anterior circulation: Both internal carotid arteries widely patent to the termini without stenosis. A1 segments widely patent. Normal anterior communicating artery complex. Both anterior cerebral arteries widely patent to their distal aspects without stenosis. No M1 stenosis or occlusion. Normal MCA bifurcations. Distal MCA branches well perfused and symmetric. Posterior circulation: Both V4 segments patent to the vertebrobasilar junction without stenosis. Both PICA origins patent and normal. Basilar widely patent to its distal aspect without stenosis. Superior cerebellar arteries patent bilaterally. Both PCAs primarily supplied via the basilar and are well perfused to there distal aspects. Anatomic variants: None significant.  No aneurysm. IMPRESSION: 1. Normal brain MRI. No acute intracranial abnormality identified. 2. Normal intracranial MRA. Electronically Signed   By: Jeannine Boga M.D.   On: 06/08/2021 02:32        Scheduled Meds:  aspirin EC  81 mg Oral Q breakfast   atorvastatin  40 mg Oral Daily   clopidogrel  75 mg Oral Daily   enoxaparin (LOVENOX) injection  40 mg Subcutaneous Q24H   insulin aspart  0-5 Units Subcutaneous QHS   insulin aspart  0-6 Units Subcutaneous TID WC   levothyroxine  150 mcg Oral q AM   multivitamin with minerals  1 tablet Oral q morning   pantoprazole  40 mg Oral Daily   polyethylene glycol  17 g Oral Daily   sodium chloride flush  3 mL Intravenous Q12H   sodium chloride flush  3 mL Intravenous Q12H   vitamin B-12  500 mcg Oral Q lunch   [START ON 06/10/2021] Vitamin D (Ergocalciferol)  50,000 Units Oral Weekly    Continuous Infusions:  sodium chloride     cefTRIAXone (ROCEPHIN)  IV Stopped (06/07/21 2130)     LOS: 0 days    Time spent: [place]    Nicolette Bang, MD [facility]  If 7PM-7AM, please contact night-coverage  06/08/2021, 1:21 PM

## 2021-06-08 NOTE — Evaluation (Signed)
Clinical/Bedside Swallow Evaluation Patient Details  Name: Maria Simmons MRN: [id number] Date of Birth: [birth year]  Today's Date: 06/08/2021 Time: SLP Start Time (ACUTE ONLY): 1010 SLP Stop Time (ACUTE ONLY): 1032 SLP Time Calculation (min) (ACUTE ONLY): 22 min  Past Medical History:  Past Medical History:  Diagnosis Date   Arthritis of shoulder region, right, degenerative    right shoulder    Bladder prolapse, female, acquired    Cancer ([place]) 2007   thyroid, papillary   Change in bowel habits 03/19/2016   Constipation    DLBCL (diffuse large B cell lymphoma) ([place])    History of B-cell lymphoma 11/18/2015   Hypothyroidism    Neutropenic fever ([place]) 11/01/2014   PONV (postoperative nausea and vomiting)    during hysterectomy    S/P radiation therapy 02/05/15-02/27/15   tonsil/neck,lt subpectoral nodal bed 30.6Gy /17 fx   Thyroid cancer ([place])    Wears glasses    Past Surgical History:  Past Surgical History:  Procedure Laterality Date   ABDOMINAL HYSTERECTOMY  2000   COLONOSCOPY     COLONOSCOPY WITH PROPOFOL N/A 06/19/2016   Procedure: COLONOSCOPY WITH PROPOFOL;  Surgeon: Ronald Lobo, MD;  Location[facility] ENDOSCOPY;  Service: Endoscopy;  Laterality: N/A;   CYSTOCELE REPAIR N/A 05/03/2018   Procedure: ANTERIOR REPAIR (CYSTOCELE);  Surgeon: Bjorn Loser, MD;  Location: [facility] ORS;  Service: Urology;  Laterality: N/A;   CYSTOSCOPY N/A 05/03/2018   Procedure: CYSTOSCOPY;  Surgeon: Bjorn Loser, MD;  Location: [facility];  Service: Urology;  Laterality: N/A;   ESOPHAGOGASTRODUODENOSCOPY (EGD) WITH PROPOFOL N/A 06/19/2016   Procedure: ESOPHAGOGASTRODUODENOSCOPY (EGD) WITH PROPOFOL;  Surgeon: Ronald Lobo, MD;  Location: [facility] ENDOSCOPY;  Service: Endoscopy;  Laterality: N/A;   LAPAROSCOPIC GASTRIC BANDING WITH HIATAL HERNIA REPAIR  05/28/2009   LYMPH NODE BIOPSY Left 10/12/2014   Procedure: excisional biopsy deep left neck lymph node;  Surgeon: Fanny Skates, MD;  Location: [place];  Service: General;  Laterality: Left;   PORT-A-CATH REMOVAL N/A 10/18/2015   Procedure: REMOVAL PORT-A-CATH;  Surgeon: Fanny Skates, MD;  Location: [facility];  Service: General;  Laterality: N/A;   PORTACATH PLACEMENT Right 10/24/2014   Procedure: INSERTION PORT-A-CATH WITH ULTRASOUND GUIDANCE;  Surgeon: Fanny Skates, MD;  Location: [place];  Service: General;  Laterality: Right;   THYROIDECTOMY  2007   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   TOTAL HIP ARTHROPLASTY Left 11/12/2017   Procedure: LEFT TOTAL HIP ARTHROPLASTY ANTERIOR APPROACH;  Surgeon: Dorna Leitz, MD;  Location: [facility];  Service: Orthopedics;  Laterality: Left;   TOTAL HIP ARTHROPLASTY Right 09/15/2019   Procedure: TOTAL HIP ARTHROPLASTY ANTERIOR APPROACH;  Surgeon: Dorna Leitz, MD;  Location: [facility];  Service: Orthopedics;  Laterality: Right;   TUBAL LIGATION  1990   VAGINAL PROLAPSE REPAIR N/A 05/03/2018   Procedure: VAULT PROLAPSE REPAIR WITH GRAFT;  Surgeon: Bjorn Loser, MD;  Location: [facility];  Service: Urology;  Laterality: N/A;   HPI:  Maria Simmons  is a 55 y.o. 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  UGI completed on 12/20/21indicated Moderate dilatation of thoracic esophagus, with severe dysmotility.  This was corrected with surgery per pt report and swallowing has been normal without regurgitation since  January.  BSE generated d/t hx of dysphagia/possible CVA this admission.  Assessment / Plan / Recommendation Clinical Impression  Pt exhibits a normal oropharyngeal swallow given the following consistencies: thin via cup/straw, puree and solids with no overt s/s of aspiration noted throughout assessment, timely swallow and adequate oral/pharyngeal preparation/clearance. Pt has a hx of esophageal dysphagia (Dec 2022) d/t gastric lap band surgery changes, but this has resolved per pt report with prior regurgitation after consuming meals before surgery to correct lap band restriction.  OME revealed mild left facial weakness at rest only, likely d/t hx of Bell's palsy per pt report.  This does not  affect overall swallowing. Recommend continue current diet of regular/thin liquids with general swallowing precautions in place.  May consider OP SLP assessment/tx if anomia persists and to r/o high level cognitive function.  Thank you for this consult.  ST will s/o in acute setting. SLP Visit Diagnosis: Dysphagia, unspecified (R13.10)    Aspiration Risk  No limitations    Diet Recommendation    Regular/thin liquids Medication Administration: Whole meds with liquid    Other  Recommendations Oral Care Recommendations: Oral care BID Other Recommendations:  (May consider OP SLP if word finding symptoms persist)   Follow up Recommendations Other (comment) (OP SLP prn for intermittent anomia if doesn't resolve)      Frequency and Duration            Prognosis Prognosis for Safe Diet Advancement: Good      Swallow Study   General Date of Onset: 06/07/21 HPI: Maria Simmons  is a 55 y.o. 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, history of diffuse large B cell lymphoma and glucose intolerance as well as history of recurrent episodes of vertigo who now  presents to the ED with  concerns about persistent vertigo different from her usual vertigo  --Patient also had difficulty with finding the right words no dysarthria per se she just could not get the right words out when she was talking Type of Study: Bedside Swallow Evaluation Previous Swallow Assessment: [place]; passed 06/07/21; has hx of dysphagia Diet Prior to this Study: Regular;Thin liquids Temperature Spikes Noted: No Respiratory Status: Room air History of Recent Intubation: No Behavior/Cognition: Alert;Cooperative;Pleasant mood Oral Cavity Assessment: Within Functional Limits Oral Care Completed by SLP: Other (Comment) (recent completion by pt) Oral Cavity - Dentition: Adequate natural dentition Vision: Functional for self-feeding Self-Feeding Abilities: Able to feed self Patient Positioning: Upright in chair Baseline Vocal Quality: Normal Volitional Cough: Strong Volitional Swallow: Able to elicit    Oral/Motor/Sensory Function Overall Oral Motor/Sensory Function: Other (comment) (Pt has hx of Bell's palsy; min weakness at rest on left)   Ice Chips Ice chips: Not tested   Thin Liquid Thin Liquid: Within functional limits Presentation: Cup;Self Fed;Straw    Nectar Thick Nectar Thick Liquid: Not tested   Honey Thick Honey Thick Liquid: Not tested   Puree Puree: Within functional limits Presentation: Self Fed   Solid     Solid: Within functional limits Presentation: Self Fed      Elvina Sidle, M.S., CCC-SLP 06/08/2021,10:56 AM

## 2021-06-08 NOTE — Progress Notes (Signed)
  Echocardiogram 2D Echocardiogram with bubble has been performed.  Merrie Roof F 06/08/2021, 3:23 PM

## 2021-06-09 ENCOUNTER — Other Ambulatory Visit (HOSPITAL_COMMUNITY): Payer: Self-pay

## 2021-06-09 ENCOUNTER — Other Ambulatory Visit (HOSPITAL_BASED_OUTPATIENT_CLINIC_OR_DEPARTMENT_OTHER): Payer: Self-pay

## 2021-06-09 ENCOUNTER — Observation Stay (HOSPITAL_BASED_OUTPATIENT_CLINIC_OR_DEPARTMENT_OTHER): Payer: 59

## 2021-06-09 DIAGNOSIS — E039 Hypothyroidism, unspecified: Secondary | ICD-10-CM | POA: Diagnosis not present

## 2021-06-09 DIAGNOSIS — Z794 Long term (current) use of insulin: Secondary | ICD-10-CM | POA: Diagnosis not present

## 2021-06-09 DIAGNOSIS — G549 Nerve root and plexus disorder, unspecified: Secondary | ICD-10-CM

## 2021-06-09 DIAGNOSIS — Z20822 Contact with and (suspected) exposure to covid-19: Secondary | ICD-10-CM | POA: Diagnosis not present

## 2021-06-09 DIAGNOSIS — E119 Type 2 diabetes mellitus without complications: Secondary | ICD-10-CM | POA: Diagnosis not present

## 2021-06-09 DIAGNOSIS — Z85850 Personal history of malignant neoplasm of thyroid: Secondary | ICD-10-CM | POA: Diagnosis not present

## 2021-06-09 DIAGNOSIS — Z79899 Other long term (current) drug therapy: Secondary | ICD-10-CM | POA: Diagnosis not present

## 2021-06-09 DIAGNOSIS — R42 Dizziness and giddiness: Secondary | ICD-10-CM | POA: Diagnosis not present

## 2021-06-09 DIAGNOSIS — N390 Urinary tract infection, site not specified: Secondary | ICD-10-CM | POA: Diagnosis not present

## 2021-06-09 DIAGNOSIS — G459 Transient cerebral ischemic attack, unspecified: Secondary | ICD-10-CM | POA: Diagnosis not present

## 2021-06-09 DIAGNOSIS — Z8572 Personal history of non-Hodgkin lymphomas: Secondary | ICD-10-CM | POA: Diagnosis not present

## 2021-06-09 LAB — LIPID PANEL
Cholesterol: 230 mg/dL — ABNORMAL HIGH (ref 0–200)
HDL: 69 mg/dL (ref >40)
LDL Cholesterol: 153 mg/dL — ABNORMAL HIGH (ref 0–99)
Total CHOL/HDL Ratio: 3.3 RATIO
Triglycerides: 39 mg/dL (ref <150)
VLDL: 8 mg/dL (ref 0–40)

## 2021-06-09 LAB — URINE CULTURE: Special Requests: NORMAL

## 2021-06-09 LAB — HEMOGLOBIN A1C
Hgb A1c MFr Bld: 6.8 % — ABNORMAL HIGH (ref 4.8–5.6)
Mean Plasma Glucose: 148 mg/dL

## 2021-06-09 LAB — GLUCOSE, CAPILLARY
Glucose-Capillary: 88 mg/dL (ref 70–99)
Glucose-Capillary: 74 mg/dL (ref 70–99)

## 2021-06-09 LAB — TSH: TSH: 75.804 u[IU]/mL — ABNORMAL HIGH (ref 0.350–4.500)

## 2021-06-09 LAB — T4, FREE: Free T4: 0.52 ng/dL — ABNORMAL LOW (ref 0.61–1.12)

## 2021-06-09 MED ORDER — CLOPIDOGREL BISULFATE 75 MG PO TABS
75.0000 mg | ORAL_TABLET | Freq: Every day | ORAL | 0 refills | Status: DC
  Filled 2021-06-09: qty 21, 21d supply, fill #0

## 2021-06-09 MED ORDER — TRULICITY 0.75 MG/0.5ML ~~LOC~~ SOAJ
0.7500 mg | SUBCUTANEOUS | 1 refills | Status: DC
  Filled 2021-06-09: qty 2, 28d supply, fill #0

## 2021-06-09 MED ORDER — CLOPIDOGREL BISULFATE 75 MG PO TABS
75.0000 mg | ORAL_TABLET | Freq: Every day | ORAL | 0 refills | Status: DC
  Filled 2021-06-09 – 2021-06-10 (×2): qty 21, 21d supply, fill #0

## 2021-06-09 MED ORDER — CEFDINIR 300 MG PO CAPS
300.0000 mg | ORAL_CAPSULE | Freq: Two times a day (BID) | ORAL | 0 refills | Status: AC
  Filled 2021-06-09 – 2021-06-10 (×2): qty 6, 3d supply, fill #0

## 2021-06-09 MED ORDER — LEVOTHYROXINE SODIUM 150 MCG PO TABS
150.0000 ug | ORAL_TABLET | Freq: Every morning | ORAL | 2 refills | Status: DC
  Filled 2021-06-09 – 2021-06-27 (×2): qty 30, 30d supply, fill #0

## 2021-06-09 MED ORDER — ATORVASTATIN CALCIUM 40 MG PO TABS
40.0000 mg | ORAL_TABLET | Freq: Every day | ORAL | 0 refills | Status: DC
  Filled 2021-06-09 – 2021-06-10 (×2): qty 30, 30d supply, fill #0

## 2021-06-09 MED ORDER — ATORVASTATIN CALCIUM 40 MG PO TABS
40.0000 mg | ORAL_TABLET | Freq: Every day | ORAL | 0 refills | Status: DC
  Filled 2021-06-09: qty 30, 30d supply, fill #0

## 2021-06-09 MED ORDER — ASPIRIN 81 MG PO TBEC
81.0000 mg | DELAYED_RELEASE_TABLET | Freq: Every day | ORAL | 11 refills | Status: DC
  Filled 2021-06-09: qty 30, 30d supply, fill #0

## 2021-06-09 MED ORDER — ASPIRIN 81 MG PO TBEC
81.0000 mg | DELAYED_RELEASE_TABLET | Freq: Every day | ORAL | 11 refills | Status: AC
  Filled 2021-06-09 – 2021-06-10 (×2): qty 30, 30d supply, fill #0
  Filled 2021-06-27 – 2021-07-16 (×2): qty 30, 30d supply, fill #1
  Filled 2021-08-29: qty 30, 30d supply, fill #2
  Filled 2021-10-15: qty 30, 30d supply, fill #3
  Filled 2021-11-28: qty 30, 30d supply, fill #4
  Filled 2021-12-29: qty 30, 30d supply, fill #5
  Filled 2022-02-17: qty 30, 30d supply, fill #6
  Filled 2022-04-27: qty 30, 30d supply, fill #7

## 2021-06-09 MED ORDER — CEFDINIR 300 MG PO CAPS
300.0000 mg | ORAL_CAPSULE | Freq: Two times a day (BID) | ORAL | 0 refills | Status: DC
  Filled 2021-06-09: qty 6, 3d supply, fill #0

## 2021-06-09 NOTE — Discharge Summary (Signed)
Physician Discharge Summary  [name] GGY:[id number] DOB: 1966-01-13 DOA: 06/07/2021  PCP: Forrest Moron, MD  Admit date: 06/07/2021 Discharge date: 06/09/2021  Admitted From: Observation Disposition: home  Recommendations for Outpatient Follow-up:  Follow up with PCP in 1-2 weeks F/up with endocrine as discussed  Home Health:No Equipment/Devices:no new equip , already has at home  Discharge Condition:Stable CODE STATUS:Full code Diet recommendation: Cardiac diet  Brief/Interim Summary: Brief/Interim Summary: Per admitting MD:  Maria Simmons  is a 55 y.o. 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, history of diffuse large B cell lymphoma and glucose intolerance as well as history of recurrent episodes of vertigo who now  presents to the ED with concerns about persistent vertigo different from her usual vertigo --Patient also had difficulty with finding the right words no dysarthria per se she just could not get the right words out when she was talking -Additional history obtained from patient's daughter at bedside -Patient complains about gait problems/ataxia, she has had persistent dizziness as well as nausea and vomiting emesis is without bile or blood -Onset of symptoms was 06/06/2021 -Denies frank chest pains or shortness of breath -EKG sinus rhythm with LVH with no acute findings otherwise -EDP discussed case with on-call neurologist Dr. Quinn Axe -CT head without acute findings -Neurologist recommends MRI brain and MRA head and neck which are not available at [facility] over the weekend so transfer to [facility] for MRI brain and MRA head and neck as well as possible in-house neurology consult -CBC WNL, creatinine 1.0, electrolytes WNL, LFTs WNL -UA suggestive of UTI, urine culture pending No fever  Or chills , no flank pain  TIA -pt was  placed on telemetry monitored unit, treat empirically with aspirin 325 mg po  x 1 today, then aspirin 81 mg daily and Plavix 75 mg daily -after that continue ASA 81mg  daily only -Lipitor as ordered, 40MG  DAILY -LIPID: LDL>150 --MRI of the brain and MRA NEGATIVE -echocardiogram NEG WITH PRESERVED EF -PT/OT RECS FOR ROLLING WALKER , BEDSIDE COMMODE AND HHPT, PT     History of diffuse large B-cell lymphoma -CBC WNL,  -follow-up with Dr. Alvy Bimler which has oncologist as previously scheduled   Hypothyroidism -continue levothyroxine,  Tsh ELEVATED-PT FOLLOWS CLOSELY WITH ENDOCRINE PER HER REPORT   ??? UTI -UA suspicious for UTI, patient with nausea and vomiting - -was started om IV Rocephin  WILL COMPLETE SHORT COPURSE OF ABX   Class 3 Obesity- -Low calorie diet, portion control and increase physical activity discussed with patient -Body mass index is 35.87 kg/m.  Discharge Diagnoses:  Principal Problem:   TIA (transient ischemic attack) Active Problems:   Hx of papillary thyroid carcinoma   History of B-cell lymphoma   Hypothyroidism   Acute lower UTI    Discharge Instructions  Discharge Instructions     Ambulatory referral to Occupational Therapy   Complete by: As directed    Ambulatory referral to Physical Therapy   Complete by: As directed    Call MD for:  difficulty breathing, headache or visual disturbances   Complete by: As directed    Call MD for:  hives   Complete by: As directed    Call MD for:  persistant dizziness or light-headedness   Complete by: As directed    Call MD for:  persistant nausea and vomiting   Complete by: As directed    Call MD for:  temperature >  100.4   Complete by: As directed    Diet - low sodium heart healthy   Complete by: As directed    Increase activity slowly   Complete by: As directed       Allergies as of 06/09/2021       Reactions   Metformin And Related Rash   Fingers turned  Blue.   Tape Itching   Surgical tape.    Levaquin [levofloxacin] Rash   Penicillins Rash   Has patient had a PCN reaction causing immediate rash, facial/tongue/throat swelling, SOB or lightheadedness with hypotension: No Has patient had a PCN reaction causing severe rash involving mucus membranes or skin necrosis: No Has patient had a PCN reaction that required hospitalization No Has patient had a PCN reaction occurring within the last 10 years: No If all of the above answers are "NO", then may proceed with Cephalosporin use.        Medication List     STOP taking these medications    Biotin 5000 MCG Tabs   docusate sodium 100 MG capsule Commonly known as: Colace   multivitamin with minerals Tabs tablet   MULTIVITAMINS PO   vitamin B-12 500 MCG tablet Commonly known as: CYANOCOBALAMIN   Vitamin D (Ergocalciferol) 1.25 MG (50000 UNIT) Caps capsule Commonly known as: DRISDOL   Vitamin D 50 MCG (2000 UT) tablet       TAKE these medications    aspirin 81 MG EC tablet Take 1 tablet (81 mg total) by mouth daily with breakfast. Swallow whole. Start taking on: June 10, 2021   atorvastatin 40 MG tablet Commonly known as: LIPITOR Take 1 tablet (40 mg total) by mouth daily. Start taking on: June 10, 2021   cefdinir 300 MG capsule Commonly known as: OMNICEF Take 1 capsule (300 mg total) by mouth 2 (two) times daily for 3 days.   clopidogrel 75 MG tablet Commonly known as: Plavix Take 1 tablet (75 mg total) by mouth daily for 21 days.   pantoprazole 40 MG tablet Commonly known as: PROTONIX TAKE 1 TABLET BY MOUTH ONCE DAILY   polyethylene glycol 17 g packet Commonly known as: MIRALAX / GLYCOLAX Take 17 g by mouth daily.   Synthroid 150 MCG tablet Generic drug: levothyroxine Take 1 tablet (150 mcg total) by mouth in the morning on an empty stomach What changed: Another medication with the same name was removed. Continue taking this medication, and follow the directions you see here.   Trulicity 0.35  KK/9.3GH Sopn Generic drug: Dulaglutide Inject 0.75 mg into the skin once a week.        Follow-up Information     [facility]. Schedule an appointment as soon as possible for a visit in 1 week(s).   Specialty: Rehabilitation Contact information: [place] [id number] [place] [address] [phone]               Allergies  Allergen Reactions   Metformin And Related Rash    Fingers turned  Blue.   Tape Itching    Surgical tape.   Levaquin [Levofloxacin] Rash   Penicillins Rash    Has patient had a PCN reaction causing immediate rash, facial/tongue/throat swelling, SOB or lightheadedness with hypotension: No Has patient had a PCN reaction causing severe rash involving mucus membranes or skin necrosis: No Has patient had a PCN reaction that required hospitalization No Has patient had a PCN reaction occurring within the last 10 years: No If all of  the above answers are "NO", then may proceed with Cephalosporin use.     Consultations: None   Procedures/Studies: CT HEAD WO CONTRAST  Result Date: 06/07/2021 CLINICAL DATA:  Dizziness with nausea and vomiting. EXAM: CT HEAD WITHOUT CONTRAST TECHNIQUE: Contiguous axial images were obtained from the base of the skull through the vertex without intravenous contrast. COMPARISON:  CT maxillofacial dated June 19, 2015. FINDINGS: Brain: No evidence of acute infarction, hemorrhage, hydrocephalus, extra-axial collection or mass lesion/mass effect. Vascular: Atherosclerotic vascular calcification of the carotid siphons. No hyperdense vessel. Skull: Normal. Negative for fracture or focal lesion. Sinuses/Orbits: No acute finding. Other: None. IMPRESSION: 1. No acute intracranial abnormality. Electronically Signed   By: Titus Dubin M.D.   On: 06/07/2021 16:20   MR ANGIO HEAD WO CONTRAST  Result Date: 06/08/2021 CLINICAL DATA:  Initial evaluation for acute TIA,  dizziness. EXAM: MRI HEAD WITHOUT CONTRAST MRA HEAD WITHOUT CONTRAST TECHNIQUE: Multiplanar, multi-echo pulse sequences of the brain and surrounding structures were acquired without intravenous contrast. Angiographic images of the Circle of Willis were acquired using MRA technique without intravenous contrast. COMPARISON: No pertinent prior exam. COMPARISON:  Prior head CT from 06/07/2021. FINDINGS: MRI HEAD FINDINGS Brain: Cerebral volume within normal limits for patient age. No focal parenchymal signal abnormality identified. No abnormal foci of restricted diffusion to suggest acute or subacute ischemia. Gray-white matter differentiation well maintained. No encephalomalacia to suggest chronic infarction. No foci of susceptibility artifact to suggest acute or chronic intracranial hemorrhage. No mass lesion, midline shift or mass effect. No hydrocephalus. No extra-axial fluid collection. Major dural sinuses are grossly patent. Pituitary gland and suprasellar region are normal. Midline structures intact and normal. Vascular: Major intracranial vascular flow voids well maintained and normal in appearance. Skull and upper cervical spine: Craniocervical junction normal. Visualized upper cervical spine within normal limits. Bone marrow signal intensity normal. No scalp soft tissue abnormality. Sinuses/Orbits: Globes and orbital soft tissues within normal limits. Mild scattered mucosal thickening noted within the sphenoid ethmoidal and maxillary sinuses. No mastoid effusion. Inner ear structures grossly normal. Other: None. MRA HEAD FINDINGS Anterior circulation: Both internal carotid arteries widely patent to the termini without stenosis. A1 segments widely patent. Normal anterior communicating artery complex. Both anterior cerebral arteries widely patent to their distal aspects without stenosis. No M1 stenosis or occlusion. Normal MCA bifurcations. Distal MCA branches well perfused and symmetric. Posterior circulation:  Both V4 segments patent to the vertebrobasilar junction without stenosis. Both PICA origins patent and normal. Basilar widely patent to its distal aspect without stenosis. Superior cerebellar arteries patent bilaterally. Both PCAs primarily supplied via the basilar and are well perfused to there distal aspects. Anatomic variants: None significant.  No aneurysm. IMPRESSION: 1. Normal brain MRI. No acute intracranial abnormality identified. 2. Normal intracranial MRA. Electronically Signed   By: Jeannine Boga M.D.   On: 06/08/2021 02:32   MR BRAIN WO CONTRAST  Result Date: 06/08/2021 CLINICAL DATA:  Initial evaluation for acute TIA, dizziness. EXAM: MRI HEAD WITHOUT CONTRAST MRA HEAD WITHOUT CONTRAST TECHNIQUE: Multiplanar, multi-echo pulse sequences of the brain and surrounding structures were acquired without intravenous contrast. Angiographic images of the Circle of Willis were acquired using MRA technique without intravenous contrast. COMPARISON: No pertinent prior exam. COMPARISON:  Prior head CT from 06/07/2021. FINDINGS: MRI HEAD FINDINGS Brain: Cerebral volume within normal limits for patient age. No focal parenchymal signal abnormality identified. No abnormal foci of restricted diffusion to suggest acute or subacute ischemia. Gray-white matter differentiation well maintained. No encephalomalacia to suggest  chronic infarction. No foci of susceptibility artifact to suggest acute or chronic intracranial hemorrhage. No mass lesion, midline shift or mass effect. No hydrocephalus. No extra-axial fluid collection. Major dural sinuses are grossly patent. Pituitary gland and suprasellar region are normal. Midline structures intact and normal. Vascular: Major intracranial vascular flow voids well maintained and normal in appearance. Skull and upper cervical spine: Craniocervical junction normal. Visualized upper cervical spine within normal limits. Bone marrow signal intensity normal. No scalp soft tissue  abnormality. Sinuses/Orbits: Globes and orbital soft tissues within normal limits. Mild scattered mucosal thickening noted within the sphenoid ethmoidal and maxillary sinuses. No mastoid effusion. Inner ear structures grossly normal. Other: None. MRA HEAD FINDINGS Anterior circulation: Both internal carotid arteries widely patent to the termini without stenosis. A1 segments widely patent. Normal anterior communicating artery complex. Both anterior cerebral arteries widely patent to their distal aspects without stenosis. No M1 stenosis or occlusion. Normal MCA bifurcations. Distal MCA branches well perfused and symmetric. Posterior circulation: Both V4 segments patent to the vertebrobasilar junction without stenosis. Both PICA origins patent and normal. Basilar widely patent to its distal aspect without stenosis. Superior cerebellar arteries patent bilaterally. Both PCAs primarily supplied via the basilar and are well perfused to there distal aspects. Anatomic variants: None significant.  No aneurysm. IMPRESSION: 1. Normal brain MRI. No acute intracranial abnormality identified. 2. Normal intracranial MRA. Electronically Signed   By: Jeannine Boga M.D.   On: 06/08/2021 02:32   ECHOCARDIOGRAM COMPLETE  Result Date: 06/08/2021    ECHOCARDIOGRAM REPORT   Patient Name:   Maria Simmons Date of Exam: 06/08/2021 Medical Rec #:  [id number]          Height:       64.0 in Accession #:    [id number]         Weight:       209.0 lb Date of Birth:  08/17/1966          BSA:          1.993 m Patient Age:    29 years           BP:           136/99 mmHg Patient Gender: F                  HR:           79 bpm. Exam Location:  Inpatient Procedure: 2D Echo, Cardiac Doppler, Color Doppler and Saline Contrast Bubble            Study Indications:    Stroke I63.9  History:        Patient has no prior history of Echocardiogram examinations,                 most recent 10/24/2014.  Sonographer:    Merrie Roof RDCS Referring Phys:  [place]  1. Left ventricular ejection fraction, by estimation, is 60 to 65%. The left ventricle has normal function. The left ventricle has no regional wall motion abnormalities. There is mild left ventricular hypertrophy. Left ventricular diastolic parameters were normal.  2. Right ventricular systolic function is normal. The right ventricular size is normal. Tricuspid regurgitation signal is inadequate for assessing PA pressure.  3. The mitral valve is normal in structure. No evidence of mitral valve regurgitation. No evidence of mitral stenosis.  4. The aortic valve is tricuspid. Aortic valve regurgitation is not visualized. No aortic stenosis is present.  5. The inferior vena cava  is normal in size with greater than 50% respiratory variability, suggesting right atrial pressure of 3 mmHg.  6. Agitated saline contrast bubble study was done, which was technically difficult but no clear evidence of interatrial shunt seen. FINDINGS  Left Ventricle: Left ventricular ejection fraction, by estimation, is 60 to 65%. The left ventricle has normal function. The left ventricle has no regional wall motion abnormalities. The left ventricular internal cavity size was normal in size. There is  mild left ventricular hypertrophy. Left ventricular diastolic parameters were normal. Right Ventricle: The right ventricular size is normal. No increase in right ventricular wall thickness. Right ventricular systolic function is normal. Tricuspid regurgitation signal is inadequate for assessing PA pressure. Left Atrium: Left atrial size was normal in size. Right Atrium: Right atrial size was normal in size. Pericardium: There is no evidence of pericardial effusion. Mitral Valve: The mitral valve is normal in structure. No evidence of mitral valve regurgitation. No evidence of mitral valve stenosis. Tricuspid Valve: The tricuspid valve is normal in structure. Tricuspid valve regurgitation is trivial. Aortic  Valve: The aortic valve is tricuspid. Aortic valve regurgitation is not visualized. No aortic stenosis is present. Aortic valve mean gradient measures 4.0 mmHg. Aortic valve peak gradient measures 6.9 mmHg. Aortic valve area, by VTI measures 2.14 cm. Pulmonic Valve: The pulmonic valve was not well visualized. Pulmonic valve regurgitation is not visualized. Aorta: The aortic root is normal in size and structure. Venous: The inferior vena cava is normal in size with greater than 50% respiratory variability, suggesting right atrial pressure of 3 mmHg. IAS/Shunts: The interatrial septum was not well visualized. Agitated saline contrast was given intravenously to evaluate for intracardiac shunting. Agitated saline contrast bubble study was negative, with no evidence of any interatrial shunt.  LEFT VENTRICLE PLAX 2D LVIDd:         3.90 cm  Diastology LVIDs:         2.90 cm  LV e' medial:    7.07 cm/s LV PW:         1.00 cm  LV E/e' medial:  10.0 LV IVS:        0.80 cm  LV e' lateral:   7.72 cm/s LVOT diam:     1.80 cm  LV E/e' lateral: 9.2 LV SV:         51 LV SV Index:   26 LVOT Area:     2.54 cm  RIGHT VENTRICLE RV Basal diam:  3.30 cm LEFT ATRIUM             Index       RIGHT ATRIUM           Index LA diam:        2.90 cm 1.45 cm/m  RA Area:     12.40 cm LA Vol (A2C):   44.9 ml 22.53 ml/m RA Volume:   26.50 ml  13.29 ml/m LA Vol (A4C):   47.4 ml 23.78 ml/m LA Biplane Vol: 48.8 ml 24.48 ml/m  AORTIC VALVE AV Area (Vmax):    2.04 cm AV Area (Vmean):   2.08 cm AV Area (VTI):     2.14 cm AV Vmax:           131.00 cm/s AV Vmean:          86.600 cm/s AV VTI:            0.240 m AV Peak Grad:      6.9 mmHg AV Mean Grad:  4.0 mmHg LVOT Vmax:         105.00 cm/s LVOT Vmean:        70.700 cm/s LVOT VTI:          0.202 m LVOT/AV VTI ratio: 0.84  AORTA Ao Root diam: 3.00 cm MITRAL VALVE MV Area (PHT): 3.77 cm     SHUNTS MV Decel Time: 201 msec     Systemic VTI:  0.20 m MV E velocity: 70.70 cm/s   Systemic Diam: 1.80  cm MV A velocity: 111.00 cm/s MV E/A ratio:  0.64 Oswaldo Milian MD Electronically signed by Oswaldo Milian MD Signature Date/Time: 06/08/2021/4:52:56 PM    Final    VAS US CAROTID  Result Date: 06/09/2021 Carotid Arterial Duplex Study Patient Name:  Maria Simmons  Date of Exam:   06/09/2021 Medical Rec #: [id number]           Accession #:    [id number] Date of Birth: [birth year]           Patient Gender: F Patient Age:   27Y Exam Location:  [facility] Procedure:      VAS US CAROTID Referring Phys: [id number] [place] --------------------------------------------------------------------------------  Indications:       TIA. Risk Factors:      None. Comparison Study:  No prior studies. Performing Technologist: Oliver Hum RVT  Examination Guidelines: A complete evaluation includes B-mode imaging, spectral Doppler, color Doppler, and power Doppler as needed of all accessible portions of each vessel. Bilateral testing is considered an integral part of a complete examination. Limited examinations for reoccurring indications may be performed as noted.  Right Carotid Findings: +----------+--------+--------+--------+-----------------------+--------+           PSV cm/sEDV cm/sStenosisPlaque Description     Comments +----------+--------+--------+--------+-----------------------+--------+ CCA Prox  130     11              smooth and heterogenoustortuous +----------+--------+--------+--------+-----------------------+--------+ CCA Distal76      32              smooth and heterogenous         +----------+--------+--------+--------+-----------------------+--------+ ICA Prox  67      22              smooth and heterogenous         +----------+--------+--------+--------+-----------------------+--------+ ICA Distal104     44                                     tortuous +----------+--------+--------+--------+-----------------------+--------+ ECA       57       11                                              +----------+--------+--------+--------+-----------------------+--------+ +----------+--------+-------+--------+-------------------+           PSV cm/sEDV cmsDescribeArm Pressure (mmHG) +----------+--------+-------+--------+-------------------+ Subclavian119                                        +----------+--------+-------+--------+-------------------+ +---------+--------+--+--------+--+---------+ VertebralPSV cm/s65EDV cm/s24Antegrade +---------+--------+--+--------+--+---------+  Left Carotid Findings: +----------+--------+--------+--------+-----------------------+--------+           PSV cm/sEDV cm/sStenosisPlaque Description     Comments +----------+--------+--------+--------+-----------------------+--------+ CCA Prox  92  29              smooth and heterogenous         +----------+--------+--------+--------+-----------------------+--------+ CCA Distal72      29              smooth and heterogenous         +----------+--------+--------+--------+-----------------------+--------+ ICA Prox  69      22              smooth and heterogenous         +----------+--------+--------+--------+-----------------------+--------+ ICA Distal90      41                                     tortuous +----------+--------+--------+--------+-----------------------+--------+ ECA       91      21                                              +----------+--------+--------+--------+-----------------------+--------+ +----------+--------+--------+--------+-------------------+           PSV cm/sEDV cm/sDescribeArm Pressure (mmHG) +----------+--------+--------+--------+-------------------+ [id number]                                         +----------+--------+--------+--------+-------------------+ +---------+--------+--+--------+--+---------+ VertebralPSV cm/s74EDV cm/s24Antegrade  +---------+--------+--+--------+--+---------+   Summary: Right Carotid: Velocities in the right ICA are consistent with a 1-39% stenosis. Left Carotid: Velocities in the left ICA are consistent with a 1-39% stenosis. Vertebrals: Bilateral vertebral arteries demonstrate antegrade flow. *See table(s) above for measurements and observations.     Preliminary       Subjective: Pt reporst she feels good and very near baseline, requested d/c home  Discharge Exam: Vitals:   06/08/21 1957 06/08/21 2347  BP: 98/62 (!) 92/54  Pulse: 79 72  Resp: 15 17  Temp: 98.7 F (37.1 C) 98.3 F (36.8 C)  SpO2: 96% 100%   Vitals:   06/08/21 1207 06/08/21 1553 06/08/21 1957 06/08/21 2347  BP: (!) 139/99 (!) 115/59 98/62 (!) 92/54  Pulse: 82 84 79 72  Resp: 18 18 15 17   Temp: 98.3 F (36.8 C) 98.9 F (37.2 C) 98.7 F (37.1 C) 98.3 F (36.8 C)  TempSrc: Oral Oral Oral Oral  SpO2: 100% 98% 96% 100%  Weight:      Height:        General: Pt is alert, awake, not in acute distress Cardiovascular: RRR, S1/S2 +, no rubs, no gallops Respiratory: CTA bilaterally, no wheezing, no rhonchi Abdominal: Soft, NT, ND, bowel sounds + Extremities: no edema, no cyanosis    The results of significant diagnostics from this hospitalization (including imaging, microbiology, ancillary and laboratory) are listed below for reference.     Microbiology: Recent Results (from the past 240 hour(s))  Urine Culture     Status: Abnormal   Collection Time: 06/07/21  3:39 PM   Specimen: Urine, Clean Catch  Result Value Ref Range Status   Specimen Description   Final    URINE, CLEAN CATCH Performed at [facility], [address], [place], [place] [address]    Special Requests   Final    Normal Performed at [facility], [address], [place], [place] [address]    Culture MULTIPLE SPECIES  PRESENT, SUGGEST RECOLLECTION (A)  Final   Report Status 06/09/2021 FINAL  Final  SARS CORONAVIRUS 2 (TAT 6-24 HRS) Nasopharyngeal  Nasopharyngeal Swab     Status: None   Collection Time: 06/07/21  5:05 PM   Specimen: Nasopharyngeal Swab  Result Value Ref Range Status   SARS Coronavirus 2 NEGATIVE NEGATIVE Final    Comment: (NOTE) SARS-CoV-2 target nucleic acids are NOT DETECTED.  The SARS-CoV-2 RNA is generally detectable in upper and lower respiratory specimens during the acute phase of infection. Negative results do not preclude SARS-CoV-2 infection, do not rule out co-infections with other pathogens, and should not be used as the sole basis for treatment or other patient management decisions. Negative results must be combined with clinical observations, patient history, and epidemiological information. The expected result is Negative.  Fact Sheet for Patients: SugarRoll.be  Fact Sheet for Healthcare Providers: https://www.woods-mathews.com/  This test is not yet approved or cleared by the [place] FDA and  has been authorized for detection and/or diagnosis of SARS-CoV-2 by FDA under an Emergency Use Authorization (EUA). This EUA will remain  in effect (meaning this test can be used) for the duration of the COVID-19 declaration under Se ction 564(b)(1) of the Act, 21 U.S.C. section 360bbb-3(b)(1), unless the authorization is terminated or revoked sooner.  Performed at [facility], [address], [place], [place] [address]      Labs: BNP (last 3 results) No results for input(s): BNP in the last 8760 hours. Basic Metabolic Panel: Recent Labs  Lab 06/07/21 1539 06/08/21 0255  NA 137 138  K 3.7 3.7  CL 100 101  CO2 29 28  GLUCOSE 102* 91  BUN 15 11  CREATININE 1.05* 1.24*  CALCIUM 8.8* 8.6*   Liver Function Tests: Recent Labs  Lab 06/07/21 1539  AST 23  ALT 18  ALKPHOS 70  BILITOT 0.7  PROT 7.7  ALBUMIN 4.0   No results for input(s): LIPASE, AMYLASE in the last 168 hours. No results for input(s): AMMONIA in the last 168  hours. CBC: Recent Labs  Lab 06/07/21 1539 06/08/21 0255  WBC 4.9 3.7*  HGB 12.8 11.2*  HCT 40.4 35.8*  MCV 85.2 83.8  PLT 323 279   Cardiac Enzymes: No results for input(s): CKTOTAL, CKMB, CKMBINDEX, TROPONINI in the last 168 hours. BNP: Invalid input(s): POCBNP CBG: Recent Labs  Lab 06/08/21 0604 06/08/21 1652 06/08/21 2130 06/09/21 0625 06/09/21 1132  GLUCAP 94 122* 113* 88 74   D-Dimer No results for input(s): DDIMER in the last 72 hours. Hgb A1c Recent Labs    06/07/21 1549  HGBA1C 6.8*   Lipid Profile Recent Labs    06/08/21 0255 06/09/21 0335  CHOL 274* 230*  HDL 74 69  LDLCALC 187* 153*  TRIG 66 39  CHOLHDL 3.7 3.3   Thyroid function studies Recent Labs    06/09/21 0335  TSH 75.804*   Anemia work up No results for input(s): VITAMINB12, FOLATE, FERRITIN, TIBC, IRON, RETICCTPCT in the last 72 hours. Urinalysis    Component Value Date/Time   COLORURINE STRAW (A) 06/07/2021 1539   APPEARANCEUR CLEAR 06/07/2021 1539   LABSPEC 1.008 06/07/2021 1539   PHURINE 6.0 06/07/2021 1539   GLUCOSEU NEGATIVE 06/07/2021 1539   HGBUR SMALL (A) 06/07/2021 1539   BILIRUBINUR NEGATIVE 06/07/2021 1539   BILIRUBINUR N 02/18/2016 1025   [place] 06/07/2021 1539   PROTEINUR NEGATIVE 06/07/2021 1539   UROBILINOGEN negative 02/18/2016 1025   UROBILINOGEN 0.2 11/01/2014 1348  NITRITE NEGATIVE 06/07/2021 1539   LEUKOCYTESUR SMALL (A) 06/07/2021 1539   Sepsis Labs Invalid input(s): PROCALCITONIN,  WBC,  LACTICIDVEN Microbiology Recent Results (from the past 240 hour(s))  Urine Culture     Status: Abnormal   Collection Time: 06/07/21  3:39 PM   Specimen: Urine, Clean Catch  Result Value Ref Range Status   Specimen Description   Final    URINE, CLEAN CATCH Performed at [facility], [address], [place], [place] [address]    Special Requests   Final    Normal Performed at [facility], [address] [place], [place] [address]    Culture  MULTIPLE SPECIES PRESENT, SUGGEST RECOLLECTION (A)  Final   Report Status 06/09/2021 FINAL  Final  SARS CORONAVIRUS 2 (TAT 6-24 HRS) Nasopharyngeal Nasopharyngeal Swab     Status: None   Collection Time: 06/07/21  5:05 PM   Specimen: Nasopharyngeal Swab  Result Value Ref Range Status   SARS Coronavirus 2 NEGATIVE NEGATIVE Final    Comment: (NOTE) SARS-CoV-2 target nucleic acids are NOT DETECTED.  The SARS-CoV-2 RNA is generally detectable in upper and lower respiratory specimens during the acute phase of infection. Negative results do not preclude SARS-CoV-2 infection, do not rule out co-infections with other pathogens, and should not be used as the sole basis for treatment or other patient management decisions. Negative results must be combined with clinical observations, patient history, and epidemiological information. The expected result is Negative.  Fact Sheet for Patients: SugarRoll.be  Fact Sheet for Healthcare Providers: https://www.woods-mathews.com/  This test is not yet approved or cleared by the [place] FDA and  has been authorized for detection and/or diagnosis of SARS-CoV-2 by FDA under an Emergency Use Authorization (EUA). This EUA will remain  in effect (meaning this test can be used) for the duration of the COVID-19 declaration under Se ction 564(b)(1) of the Act, 21 U.S.C. section 360bbb-3(b)(1), unless the authorization is terminated or revoked sooner.  Performed at [facility], [place] [address] [place], [place] [address]      Time coordinating discharge: Over 30 minutes  SIGNED:   Nicolette Bang, MD  [facility] 06/09/2021, 3:01 PM Pager   If 7PM-7AM, please contact night-coverage [email or web address] Password [email or web address]

## 2021-06-09 NOTE — Progress Notes (Signed)
Physical Therapy Treatment Patient Details Name: Maria Simmons MRN: [id number] DOB: [birth year] Today's Date: 06/09/2021    History of Present Illness 55 year old female comes in 06/07/21 with chief complaint of dizziness/spinning sensation and episode of difficulty communicating. 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 She reports significant improvement in dizziness/vestibular symptoms today. Unclear if this is due to meclizine or if she was feeling improvement prior to meds. With meclizine on board, pt able to tolerate directional changes, turns, head turns, balance activity with EO/EC. Overall pt reports minimal "dizziness" throughout session. X1 exercises initiated this session and pt was able to demonstrate well. Feel we can likely progress to x2 exercises next session. Will continue to follow and progress as able per POC.     Follow Up Recommendations  Outpatient PT;Supervision for mobility/OOB (OP Neuro rehab)     Equipment Recommendations  None recommended by PT    Recommendations for Other Services       Precautions / Restrictions Precautions Precautions: Fall Restrictions Weight Bearing Restrictions: No    Mobility  Bed Mobility               General bed mobility comments: Pt was received sitting up in recliner    Transfers Overall transfer level: Modified independent Equipment used: None Transfers: Sit to/from Stand           General transfer comment: No assist to power-up to full stand. No unteadinee and no LOB noted. Pt does not endorse dizziness with transfers.  Ambulation/Gait Ambulation/Gait assistance: Supervision;Modified independent (Device/Increase time) Gait Distance (Feet): 500 Feet Assistive device:  None Gait Pattern/deviations: Step-through pattern;Drifts right/left Gait velocity: Mildly decreased Gait velocity interpretation: 1.31 - 2.62 ft/sec, indicative of limited community ambulator General Gait Details: Mildly antalgic but likely baseline due to [place] B hip replacements. With no additional challenges, pt was at a mod I level. With head turns/balance challenges, close supervision provided for safety.   Stairs             Wheelchair Mobility    Modified Rankin (Stroke Patients Only) Modified Rankin (Stroke Patients Only) Pre-Morbid Rankin Score: No symptoms Modified Rankin: Moderate disability     Balance Overall balance assessment: Needs assistance   Sitting balance-Leahy Scale: Good       Standing balance-Leahy Scale: Fair               High level balance activites: Turns;Head turns High Level Balance Comments: Pt performed head turns: horizontal, vertical, diagonal R and L, and Multidirectional focus (Up R, Up L, Down R; Up L, Up R, Down L). Multidirectional focus the most challenging for pt with mild gait deviation noted, however all other head turn activity, pt with mild gait speed decrease but tolerated well without significant gait deviation.            Cognition Arousal/Alertness: Awake/alert Behavior During Therapy: WFL for tasks assessed/performed Overall Cognitive Status: Within Functional Limits for tasks assessed                                        Exercises Other Exercises Other Exercises: Blue balance disc EO, EC: narrow BOS, Wide BOS, weight shifting forward/back,  weight shifting L/R, mini squats.    General Comments        Pertinent Vitals/Pain Pain Assessment: No/denies pain    Home Living                      Prior Function            PT Goals (current goals can now be found in the care plan section) Acute Rehab PT Goals Patient Stated Goal: to find out what is causing this feeling PT Goal  Formulation: With patient Time For Goal Achievement: 06/22/21 Potential to Achieve Goals: Good Progress towards PT goals: Progressing toward goals    Frequency    Min 4X/week      PT Plan Current plan remains appropriate    Co-evaluation              AM-PAC PT "6 Clicks" Mobility   Outcome Measure  Help needed turning from your back to your side while in a flat bed without using bedrails?: None Help needed moving from lying on your back to sitting on the side of a flat bed without using bedrails?: None Help needed moving to and from a bed to a chair (including a wheelchair)?: None Help needed standing up from a chair using your arms (e.g., wheelchair or bedside chair)?: None Help needed to walk in hospital room?: A Little Help needed climbing 3-5 steps with a railing? : A Little 6 Click Score: 22    End of Session Equipment Utilized During Treatment: Gait belt Activity Tolerance: Patient tolerated treatment well Patient left: in chair;with call bell/phone within reach;with chair alarm set Nurse Communication: Mobility status PT Visit Diagnosis: Unsteadiness on feet (R26.81);Dizziness and giddiness (R42)     Time: 4496-7591 PT Time Calculation (min) (ACUTE ONLY): 23 min  Charges:  $Gait Training: 8-22 mins $Neuromuscular Re-education: 8-22 mins                     Rolinda Roan, PT, DPT Acute Rehabilitation Services Pager: [phone] Office: [phone]    Thelma Comp 06/09/2021, 1:01 PM

## 2021-06-09 NOTE — Progress Notes (Signed)
Carotid artery duplex has been completed. Preliminary results can be found in CV Proc through chart review.   06/09/21 1:16 PM Maria Simmons RVT .

## 2021-06-10 ENCOUNTER — Other Ambulatory Visit (HOSPITAL_COMMUNITY): Payer: Self-pay

## 2021-06-10 DIAGNOSIS — Z8673 Personal history of transient ischemic attack (TIA), and cerebral infarction without residual deficits: Secondary | ICD-10-CM | POA: Diagnosis not present

## 2021-06-10 DIAGNOSIS — Z800 Family history of malignant neoplasm of digestive organs: Secondary | ICD-10-CM | POA: Diagnosis not present

## 2021-06-10 DIAGNOSIS — R1110 Vomiting, unspecified: Secondary | ICD-10-CM | POA: Diagnosis not present

## 2021-06-10 DIAGNOSIS — R635 Abnormal weight gain: Secondary | ICD-10-CM | POA: Diagnosis not present

## 2021-06-10 DIAGNOSIS — K5904 Chronic idiopathic constipation: Secondary | ICD-10-CM | POA: Diagnosis not present

## 2021-06-10 LAB — HEMOGLOBIN A1C
Hgb A1c MFr Bld: 6.6 % — ABNORMAL HIGH (ref 4.8–5.6)
Mean Plasma Glucose: 143 mg/dL

## 2021-06-19 ENCOUNTER — Other Ambulatory Visit (HOSPITAL_COMMUNITY): Payer: Self-pay

## 2021-06-19 MED ORDER — CLOPIDOGREL BISULFATE 75 MG PO TABS
75.0000 mg | ORAL_TABLET | Freq: Every day | ORAL | 1 refills | Status: DC
  Filled 2021-06-19: qty 30, 30d supply, fill #0

## 2021-06-19 MED ORDER — ATORVASTATIN CALCIUM 40 MG PO TABS
40.0000 mg | ORAL_TABLET | Freq: Every day | ORAL | 1 refills | Status: DC
  Filled 2021-06-19 – 2021-07-16 (×2): qty 30, 30d supply, fill #0
  Filled 2021-08-29: qty 30, 30d supply, fill #1

## 2021-06-26 ENCOUNTER — Encounter: Payer: Self-pay | Admitting: Physical Therapy

## 2021-06-26 ENCOUNTER — Other Ambulatory Visit: Payer: Self-pay

## 2021-06-26 ENCOUNTER — Ambulatory Visit: Payer: 59 | Admitting: Physical Therapy

## 2021-06-26 ENCOUNTER — Ambulatory Visit: Payer: 59 | Attending: Internal Medicine | Admitting: Physical Therapy

## 2021-06-26 DIAGNOSIS — R632 Polyphagia: Secondary | ICD-10-CM | POA: Diagnosis not present

## 2021-06-26 DIAGNOSIS — Z9884 Bariatric surgery status: Secondary | ICD-10-CM | POA: Diagnosis not present

## 2021-06-26 DIAGNOSIS — R278 Other lack of coordination: Secondary | ICD-10-CM | POA: Diagnosis not present

## 2021-06-26 DIAGNOSIS — Z4651 Encounter for fitting and adjustment of gastric lap band: Secondary | ICD-10-CM | POA: Diagnosis not present

## 2021-06-26 NOTE — Progress Notes (Signed)
 PROVIDER:  TONJA EVERLENE BARBAN, PA  MRN: [id number] DOB: Nov 12, 1966 DATE OF ENCOUNTER: 06/26/2021  Subjective   History of Present Illness: Maria Simmons is a 55 y.o. female who underwent lap band placement by Dr. Mikell.  She was seen in January 2022 at which time we removed all fluid from her band due to overly restrictive symptoms.  At that time she weighed 173 pounds.  She was seen the following month at which time no adjustment was made.  Today, she weighs 212 pounds, gaining 37 pounds since Lap-Band vacation.  She states her A1c was usually between 5.9 and 6.1, but it was recently 6.6.  Her PCP placed her on Trulicity , but due to being admitted for possible TIAs after initiation, they stopped it in case it was a contributing factor.  She is interested in having some fluid added to her band to get her back on track.  She denies reflux, regurgitation, nighttime cough, or difficulty laying flat at night.   Review of Systems: A complete review of systems was obtained from the patient.  I have reviewed this information and discussed as appropriate with the patient.  See HPI as well for other ROS.  ROS  All other review of systems negative.   Medical History: Past Medical History:  Diagnosis Date  . Glucose intolerance (impaired glucose tolerance)   . Hypothyroid, unspecified   . Thyroid  cancer (CMS-HCC)     Patient Active Problem List  Diagnosis  . DLBCL (diffuse large B cell lymphoma) , unspecified (CMS-HCC)  . On antineoplastic chemotherapy  . Hx of radiation therapy  . Cancer of thyroid  (CMS-HCC)    Past Surgical History:  Procedure Laterality Date  . BARIATRIC SURGERY    . THYROIDECTOMY       Allergies  Allergen Reactions  . Adhesive Tape-Silicones Itching    Surgical tape.  . Other Rash    Fingers turned  Blue with metformin  . Levofloxacin  Rash  . Penicillins Rash    Current Outpatient Medications on File Prior to Visit  Medication Sig Dispense  Refill  . allopurinol  (ZYLOPRIM ) 300 MG tablet once daily.   3  . aspirin  81 MG EC tablet Take by mouth once daily.     . biotin 5 mg Tab Take by mouth once daily.     . cholecalciferol (VITAMIN D3) 1,000 unit tablet Take by mouth once daily.     . cyanocobalamin  (VITAMIN B12) 1000 MCG tablet Take 1,000 mcg by mouth once daily.    . HYDROcodone -acetaminophen  (NORCO) 5-325 mg tablet every 4 (four) hours as needed.   0  . lidocaine -prilocaine  (EMLA ) cream as needed.   3  . loperamide (IMODIUM) 1 mg/5 mL solution Take by mouth 4 (four) times daily as needed.     . LORazepam  (ATIVAN ) 0.5 MG tablet 2 (two) times daily as needed.   0  . multivitamin with iron-minerals (VITAMINS & MINERALS) tablet Take by mouth once daily.     . ondansetron  (ZOFRAN ) 8 MG tablet every 12 (twelve) hours as needed.   1  . predniSONE  (DELTASONE ) 20 MG tablet once daily. 100 mg for 5 days after chemo  2  . prochlorperazine  (COMPAZINE ) 10 MG tablet every 6 (six) hours as needed.   6  . SYNTHROID  150 mcg tablet once daily.   6   No current facility-administered medications on file prior to visit.    Family History  Problem Relation Age of Onset  . Colon cancer Mother   .  Prostate cancer Father   . Peripheral vascular disease Father   . Kidney disease Father   . Colon polyps Maternal Uncle   . COPD Maternal Grandfather   . Uterine cancer Paternal Grandmother      Social History   Tobacco Use  Smoking Status Never Smoker  Smokeless Tobacco Not on file     Social History   Socioeconomic History  . Marital status: Divorced  Tobacco Use  . Smoking status: Never Smoker  Substance and Sexual Activity  . Alcohol use: No  . 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   Lives with her grown son and daughters    Objective:   BP 132/88   Pulse 97   Temp 36.5 C (97.7 F)   Ht 162.6 cm (5' 4)   Wt 96.5 kg (212 lb 12.8 oz)   SpO2 100%   BMI 36.53  kg/m  Body mass index is 36.53 kg/m.  General: Well-developed, well-nourished, in no acute distress.  Wearing mask Eyes: No scleral icterus.  Pupils equal, lids normal Respiratory: Normal effort, no use of accessory muscles Abdomen: Soft, non-tender, non-distended.  Port is located on right abdomen, slightly difficult to feel, but essentially flat during this visit Psychiatric: Normal judgement and insight.  Normal mood and affect.  Alert, oriented x 3  Assessment and Plan:   Diagnoses and all orders for this visit:  Overeating  Morbid (severe) obesity due to excess calories (CMS-HCC)  History of laparoscopic adjustable gastric banding    Procedure: Informed verbal consent was obtained for adjustment of the band. The port site was positively identified and prepped with chloroprep. The port was accessed with a 20G Huber needle and clear fluid was aspirated. 3 mL saline was added to the port. A sterile bandage was applied. The patient was able to take sips of water  without difficulty. The patient tolerated the procedure well without complication.  Plan: She is presenting to the office for band fill.  She has gained 37 pounds in 7 months and would like to improve her A1c.  I attempted to access the port but was unsuccessful.  Dr. Lyndel was able to access the port, which was essentially flat now, and added 3 cc of fluid.  She was able to drink water  well afterwards.  She will call if she develops any symptoms of over restriction.  Return in about 2 weeks (around 07/10/2021).  This patient encounter took 15 minutes today to perform the following: take history, perform exam, review outside records, interpret imaging, counsel the patient on their diagnosis and document encounter, findings & plan in the EHR.   Puja Gosai, PA-C

## 2021-06-26 NOTE — Therapy (Signed)
[facility] [address] [place], [place], [address] Phone: [phone]   Fax:  [phone]  Physical Therapy Evaluation  Patient Details  Name: Maria Simmons MRN: [id number] Date of Birth: [birth year] Referring Provider (PT): Marcell Anger, MD   Encounter Date: 06/26/2021   PT End of Session - 06/26/21 0842     Visit Number 1    Number of Visits 1    Date for PT Re-Evaluation 06/26/21    Authorization Type [employer]    PT Start Time 0805    PT Stop Time [phone]    PT Time Calculation (min) 30 min    Activity Tolerance Patient tolerated treatment well    Behavior During Therapy [facility] for tasks assessed/performed             Past Medical History:  Diagnosis Date   Arthritis of shoulder region, right, degenerative    right shoulder    Bladder prolapse, female, acquired    Cancer ([place]) 2007   thyroid, papillary   Change in bowel habits 03/19/2016   Constipation    DLBCL (diffuse large B cell lymphoma) ([place])    History of B-cell lymphoma 11/18/2015   Hypothyroidism    Neutropenic fever ([place]) 11/01/2014   PONV (postoperative nausea and vomiting)    during hysterectomy    S/P radiation therapy 02/05/15-02/27/15   tonsil/neck,lt subpectoral nodal bed 30.6Gy /17 fx   Thyroid cancer ([place])    Wears glasses     Past Surgical History:  Procedure Laterality Date   ABDOMINAL HYSTERECTOMY  2000   COLONOSCOPY     COLONOSCOPY WITH PROPOFOL N/A 06/19/2016   Procedure: COLONOSCOPY WITH PROPOFOL;  Surgeon: Ronald Lobo, MD;  Location: [facility] ENDOSCOPY;  Service: Endoscopy;  Laterality: N/A;   CYSTOCELE REPAIR N/A 05/03/2018   Procedure: ANTERIOR REPAIR (CYSTOCELE);  Surgeon: Bjorn Loser, MD;  Location: [facility] ORS;  Service: Urology;  Laterality: N/A;   CYSTOSCOPY N/A 05/03/2018   Procedure: CYSTOSCOPY;  Surgeon: Bjorn Loser, MD;  Location: [facility];  Service: Urology;  Laterality: N/A;    ESOPHAGOGASTRODUODENOSCOPY (EGD) WITH PROPOFOL N/A 06/19/2016   Procedure: ESOPHAGOGASTRODUODENOSCOPY (EGD) WITH PROPOFOL;  Surgeon: Ronald Lobo, MD;  Location: [facility] ENDOSCOPY;  Service: Endoscopy;  Laterality: N/A;   LAPAROSCOPIC GASTRIC BANDING WITH HIATAL HERNIA REPAIR  05/28/2009   LYMPH NODE BIOPSY Left 10/12/2014   Procedure: excisional biopsy deep left neck lymph node;  Surgeon: Fanny Skates, MD;  Location: [place];  Service: General;  Laterality: Left;   PORT-A-CATH REMOVAL N/A 10/18/2015   Procedure: REMOVAL PORT-A-CATH;  Surgeon: Fanny Skates, MD;  Location: [facility];  Service: General;  Laterality: N/A;   PORTACATH PLACEMENT Right 10/24/2014   Procedure: INSERTION PORT-A-CATH WITH ULTRASOUND GUIDANCE;  Surgeon: Fanny Skates, MD;  Location: [place];  Service: General;  Laterality: Right;   THYROIDECTOMY  2007   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   TOTAL HIP ARTHROPLASTY Left 11/12/2017   Procedure: LEFT TOTAL HIP ARTHROPLASTY ANTERIOR APPROACH;  Surgeon: Dorna Leitz, MD;  Location: [facility];  Service: Orthopedics;  Laterality: Left;   TOTAL HIP ARTHROPLASTY Right 09/15/2019   Procedure: TOTAL HIP ARTHROPLASTY ANTERIOR APPROACH;  Surgeon: Dorna Leitz, MD;  Location: [facility];  Service: Orthopedics;  Laterality: Right;   TUBAL LIGATION  1990   VAGINAL PROLAPSE REPAIR N/A 05/03/2018   Procedure: VAULT PROLAPSE REPAIR WITH GRAFT;  Surgeon: Bjorn Loser, MD;  Location: [facility];  Service: Urology;  Laterality: N/A;    There were no vitals filed for this visit.    Subjective Assessment - 06/26/21 0809     Subjective Pt was working on June 24th, works from home and had difficulty with speech.  Went out and did yard work and symptoms, including dizziness got worse.  Thought she was dehydrated, woke up the next morning and couldn't walk.  Vomited due to dizziness, called EMS.  At hospital, no abnormalities seen on  imaging.  Admitted to the hospital with TIA.  D/C home and has returned to baseline; is back to driving, walking and working.    Pertinent History intermittent vertigo, arthritis Rt shoulder, bil THA, bladder prolapse, lymphoma, thyroid cancer, hypothyroidism, obesity    Patient Stated Goals No specific goals    Currently in Pain? No/denies                [facility] PT Assessment - 06/26/21 0815       Assessment   Medical Diagnosis Vertigo/TIA    Referring Provider (PT) Spongberg, Audie Pinto, MD    Onset Date/Surgical Date 06/06/21    Prior Therapy in hospital      Precautions   Precautions Other (comment)    Precaution Comments intermittent vertigo, arthritis Rt shoulder, bil THA, bladder prolapse, lymphoma, thyroid cancer, hypothyroidism, obesity      Restrictions   Other Position/Activity Restrictions no      Balance Screen   Has the patient fallen in the past 6 months No      [place] residence      Prior Function   Level of Independence Independent    Vocation Full time employment    Vocation Requirements works from home    Leisure Walks for exercise      Observation/Other Assessments   Focus on Therapeutic Outcomes (FOTO)  DFS: 27 WFL; DPS: 69.8 WNL      Sensation   Light Touch Appears Intact      Coordination   Gross Motor Movements are Fluid and Coordinated No    Coordination and Movement Description slightly dysmetric LUE and LLE    Finger Nose Finger Test slightly impaired LUE    Heel Shin Test slightly impaired LLE      ROM / Strength   AROM / PROM / Strength Strength      Strength   Overall Strength Within functional limits for tasks performed      Ambulation/Gait   Ambulation/Gait Yes    Ambulation/Gait Assistance 7: Independent    Assistive device None    Gait Pattern Within Functional Limits    Ambulation Surface Level;Indoor                    Vestibular Assessment - 06/26/21 0821        Symptom Behavior   Subjective history of current problem Pt reports no further symptoms, back to baseline    Type of Dizziness  Not applicable    Frequency of Dizziness no longer occuring    Duration of Dizziness none    Aggravating Factors Not applicable    Relieving Factors Not applicable    Progression of Symptoms Better      Oculomotor Exam   Oculomotor Alignment Normal    Ocular ROM WNL    Spontaneous Absent    Gaze-induced  Absent    Smooth Pursuits Saccades    Saccades Intact      Oculomotor Exam-Fixation Suppressed    Left  Head Impulse negative    Right Head Impulse negative      Vestibulo-Ocular Reflex   VOR to Slow Head Movement Normal    VOR Cancellation Corrective saccades      Positional Testing   Dix-Hallpike Dix-Hallpike Right;Dix-Hallpike Left    Horizontal Canal Testing Horizontal Canal Right;Horizontal Canal Left      Dix-Hallpike Right   Dix-Hallpike Right Duration 0    Dix-Hallpike Right Symptoms No nystagmus      Dix-Hallpike Left   Dix-Hallpike Left Duration 0    Dix-Hallpike Left Symptoms No nystagmus      Horizontal Canal Right   Horizontal Canal Right Duration 0    Horizontal Canal Right Symptoms Normal      Horizontal Canal Left   Horizontal Canal Left Duration 0    Horizontal Canal Left Symptoms Normal      Positional Sensitivities   Sit to Supine No dizziness    Supine to Left Side No dizziness    Supine to Right Side No dizziness    Supine to Sitting No dizziness    Right Hallpike No dizziness    Up from Right Hallpike No dizziness    Up from Left Hallpike No dizziness    Nose to Right Knee No dizziness    Right Knee to Sitting No dizziness    Nose to Left Knee No dizziness    Left Knee to Sitting No dizziness    Head Turning x 5 No dizziness    Head Nodding x 5 No dizziness    Pivot Right in Standing No dizziness    Pivot Left in Standing No dizziness    Rolling Right No dizziness    Rolling Left No dizziness                 Objective measurements completed on examination: See above findings.               PT Education - 06/26/21 0841     Education Details clinical findings of impaired coordination; due to pt report of no symptoms and return to baseline no need for PT follow up at this time; discussed importance of following up with PCP and neurology referral for further work up and CVA prevention    Person(s) Educated Patient    Methods Explanation    Comprehension Verbalized understanding                         Plan - 06/26/21 0843     Clinical Impression Statement Pt is 55 year old female referred to [facility] following hospitalization for vertigo and suspected TIA.  Pt reports full resolution of all stroke like symptoms and dizziness and has returned to walking, work, driving and ADL's.  Only impairments found today were mild impairments in LUE and LLE coordination and impaired saccades.  Due to return to baseline and no significant impairments and functional limitations, no PT follow up needed at this time.  Pt strongly encouraged to follow up with PCP and neurology for further CVA work up and prevention care.  Pt agreeable to plan.    Personal Factors and Comorbidities Comorbidity 3+    Comorbidities intermittent vertigo, arthritis Rt shoulder, bil THA, bladder prolapse, lymphoma, thyroid cancer, hypothyroidism, obesity    Examination-Activity Limitations --   none   Examination-Participation Restrictions --   none   Stability/Clinical Decision Making Stable/Uncomplicated    Clinical Decision Making Low    Rehab Potential Good  PT Frequency One time visit    PT Duration Other (comment)   eval only   PT Next Visit Plan Eval only    Recommended Other Services PCP and Neurology    Consulted and Agree with Plan of Care Patient             Patient will benefit from skilled therapeutic intervention in order to improve the following deficits and impairments:   Decreased coordination  Visit Diagnosis: Other lack of coordination     Problem List Patient Active Problem List   Diagnosis Date Noted   TIA (transient ischemic attack) 06/07/2021   Acute lower UTI 06/07/2021   Dysphagia 11/14/2020   Musculoskeletal disorder 10/21/2020   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   Imbalance 10/21/2020   Family history of cancer 10/21/2020   Primary osteoarthritis of right hip 09/15/2019   Other constipation 11/25/2018   Rectal bleeding 10/22/2018   Hypothyroidism 10/22/2018   Hyperglycemia 10/22/2018   Vaginal prolapse 05/03/2018   Hoarseness of voice 04/18/2018   Primary osteoarthritis of left hip 11/12/2017   H/O laparoscopic adjustable gastric banding 09/24/2017   B12 deficiency anemia 09/24/2017   Iron deficiency anemia 09/24/2017   Left hip pain 09/24/2017   Esophagitis 06/25/2016   Stye 06/25/2016   Change in bowel habits 03/19/2016   History of B-cell lymphoma 11/18/2015   Right shoulder pain 01/07/2015   Anemia in chronic illness 11/15/2014   Chronic leukopenia 11/15/2014   Neuropathy due to chemotherapeutic drug ([place]) 11/15/2014   History of lymphoma 10/19/2014   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   Leucopenia 09/02/2012    Rico Junker, PT, DPT 06/26/21    8:50 AM    [facility] [address] [place], [place], [address] Phone: [phone]   Fax:  [phone]  Name: MICHALE WEIKEL MRN: [id number] Date of Birth: 12-08-1966

## 2021-06-27 ENCOUNTER — Other Ambulatory Visit (HOSPITAL_COMMUNITY): Payer: Self-pay

## 2021-06-27 MED ORDER — ATORVASTATIN CALCIUM 40 MG PO TABS
40.0000 mg | ORAL_TABLET | Freq: Every day | ORAL | 1 refills | Status: DC
  Filled 2021-06-27 – 2021-10-15 (×2): qty 30, 30d supply, fill #0
  Filled 2021-11-28: qty 30, 30d supply, fill #1

## 2021-06-27 MED ORDER — CLOPIDOGREL BISULFATE 75 MG PO TABS
75.0000 mg | ORAL_TABLET | Freq: Every day | ORAL | 1 refills | Status: DC
  Filled 2021-06-27: qty 30, 30d supply, fill #0
  Filled 2021-08-29: qty 30, 30d supply, fill #1

## 2021-07-01 ENCOUNTER — Encounter: Payer: Self-pay | Admitting: Neurology

## 2021-07-14 DIAGNOSIS — Z9884 Bariatric surgery status: Secondary | ICD-10-CM | POA: Diagnosis not present

## 2021-07-14 DIAGNOSIS — R632 Polyphagia: Secondary | ICD-10-CM | POA: Diagnosis not present

## 2021-07-14 DIAGNOSIS — E559 Vitamin D deficiency, unspecified: Secondary | ICD-10-CM | POA: Diagnosis not present

## 2021-07-14 DIAGNOSIS — Z4651 Encounter for fitting and adjustment of gastric lap band: Secondary | ICD-10-CM | POA: Diagnosis not present

## 2021-07-14 DIAGNOSIS — E890 Postprocedural hypothyroidism: Secondary | ICD-10-CM | POA: Diagnosis not present

## 2021-07-15 ENCOUNTER — Other Ambulatory Visit (HOSPITAL_COMMUNITY): Payer: Self-pay

## 2021-07-15 MED ORDER — LEVOTHYROXINE SODIUM 175 MCG PO TABS
175.0000 ug | ORAL_TABLET | Freq: Every morning | ORAL | 6 refills | Status: DC
  Filled 2021-07-15: qty 30, 30d supply, fill #0

## 2021-07-15 MED ORDER — LEVOTHYROXINE SODIUM 175 MCG PO TABS
175.0000 ug | ORAL_TABLET | Freq: Every morning | ORAL | 6 refills | Status: DC
  Filled 2021-07-15: qty 30, 30d supply, fill #0
  Filled 2021-08-29: qty 30, 30d supply, fill #1

## 2021-07-16 ENCOUNTER — Other Ambulatory Visit (HOSPITAL_COMMUNITY): Payer: Self-pay

## 2021-08-29 ENCOUNTER — Other Ambulatory Visit (HOSPITAL_COMMUNITY): Payer: Self-pay

## 2021-08-29 MED FILL — Pantoprazole Sodium EC Tab 40 MG (Base Equiv): ORAL | 90 days supply | Qty: 90 | Fill #0 | Status: AC

## 2021-09-15 NOTE — Progress Notes (Signed)
NEUROLOGY CONSULTATION NOTE  Maria Simmons MRN: [id number] DOB: [birth year]  Referring provider: Delia Chimes, MD Primary care provider: Delia Chimes, MD  Reason for consult:  transient ischemic attack  Assessment/Plan:   Possible cerebellar transient ischemic attack Hyperlipidemia Left meralgia paresthetica History of left-sided Bell's palsy with increased left eye lacrimation  1  ASA 81mg  daily.  Stop Plavix. 2  Atorvastatin 40mg  daily.  LDL goal less than 70 3  Glycemic control.  Hgb A1c goal less than 7 4  Normotensive blood pressure 5  Mediterranean diet 6 Routine exercise 7 Recommend following up with ophthalmology 8 Explained weight loss and not wearing tight pants at the waist may help with the thigh numbness. 9  Follow up 6 months   Subjective:  Maria Simmons is a 55 year old right-handed female with hypothyroidism s/p surgical management for thyroid cancer and history of diffuse large B cell lymphoma who presents for transient ischemic attack.  History supplemented by hospital records.  CT and MRI of brain and MRA head personally reviewed.   Left eye doesn't feel a strain when moves eye and tears residual - history of left Bell's palsy 2005  On the evening of 06/05/2021, she started to exhibit some slurred speech and felt a little dizzy.  She thought that she may have been dehydrated so she took a shower and went to bed.  The next morning, she had vertigo (spinning sensation) with ataxia, difficulty getting words out, nausea, vomiting and headache.  She has prior history of vertigo but nothing like this.  She denies history of headaches.  She presented to [facility] where CT head was negative.  She was transferred to [facility] for TIA/CVA workup.  MRI of brain was negative for acute stroke.  MRA of head and neck was negative for LVO or hemodynamically significant stenosis.  Carotid ultrasound showed 1-39% bilateral ICA stenosis and antegrade  flow of the bilateral vertebral arteries.  Echocardiogram showed EF 60-65% with no cardiac source of emboli.  Telemetry revealed no a fib.  LDL was 187, Hgb A1c was 6.8 and HIV and Sars Coronvirus 2 were negative.  TSH was initially found to be 80.584 so her Synthroid was adjusted.  She was not on antithrombotic therapy or statin prior to admission.  She was discharged on ASA 18mg  daily, Plavix 75mg  daily and atorvastatin 40mg  daily.  Vertigo resolved after several hours but she felt unsteady with headaches for the next few days.  She remains on ASA, Plavix and atorvastatin.  Since the TIA, she reports increased tearing of her left eye.  She has remote history of left sided Bell's palsy and previously experienced gustatory tearing, but it has been worse. And occurring frequently.  She also notes numbness involving the left lateral thigh.  PAST MEDICAL HISTORY: Past Medical History:  Diagnosis Date   Arthritis of shoulder region, right, degenerative    right shoulder    Bladder prolapse, female, acquired    Cancer ([place]) 2007   thyroid, papillary   Change in bowel habits 03/19/2016   Constipation    DLBCL (diffuse large B cell lymphoma) ([place])    History of B-cell lymphoma 11/18/2015   Hypothyroidism    Neutropenic fever ([place]) 11/01/2014   PONV (postoperative nausea and vomiting)    during hysterectomy    S/P radiation therapy 02/05/15-02/27/15   tonsil/neck,lt subpectoral nodal bed 30.6Gy /17 fx   Thyroid cancer ([place])    Wears glasses  PAST SURGICAL HISTORY: Past Surgical History:  Procedure Laterality Date   ABDOMINAL HYSTERECTOMY  2000   COLONOSCOPY     COLONOSCOPY WITH PROPOFOL N/A 06/19/2016   Procedure: COLONOSCOPY WITH PROPOFOL;  Surgeon: Ronald Lobo, MD;  Location: [facility] ENDOSCOPY;  Service: Endoscopy;  Laterality: N/A;   CYSTOCELE REPAIR N/A 05/03/2018   Procedure: ANTERIOR REPAIR (CYSTOCELE);  Surgeon: Bjorn Loser, MD;  [facility];  Service: Urology;  Laterality: N/A;    CYSTOSCOPY N/A 05/03/2018   Procedure: CYSTOSCOPY;  Surgeon: Bjorn Loser, MD;  [facility];  Service: Urology;  Laterality: N/A;   ESOPHAGOGASTRODUODENOSCOPY (EGD) WITH PROPOFOL N/A 06/19/2016   Procedure: ESOPHAGOGASTRODUODENOSCOPY (EGD) WITH PROPOFOL;  Surgeon: Ronald Lobo, MD;  Location: [facility] ENDOSCOPY;  Service: Endoscopy;  Laterality: N/A;   LAPAROSCOPIC GASTRIC BANDING WITH HIATAL HERNIA REPAIR  05/28/2009   LYMPH NODE BIOPSY Left 10/12/2014   Procedure: excisional biopsy deep left neck lymph node;  Surgeon: Fanny Skates, MD;  Location: [place];  Service: General;  Laterality: Left;   PORT-A-CATH REMOVAL N/A 10/18/2015   Procedure: REMOVAL PORT-A-CATH;  Surgeon: Fanny Skates, MD;  [facility];  Service: General;  Laterality: N/A;   PORTACATH PLACEMENT Right 10/24/2014   Procedure: INSERTION PORT-A-CATH WITH ULTRASOUND GUIDANCE;  Surgeon: Fanny Skates, MD;  Location: [place];  Service: General;  Laterality: Right;   THYROIDECTOMY  2007   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   TOTAL HIP ARTHROPLASTY Left 11/12/2017   Procedure: LEFT TOTAL HIP ARTHROPLASTY ANTERIOR APPROACH;  Surgeon: Dorna Leitz, MD;  [facility];  Service: Orthopedics;  Laterality: Left;   TOTAL HIP ARTHROPLASTY Right 09/15/2019   Procedure: TOTAL HIP ARTHROPLASTY ANTERIOR APPROACH;  Surgeon: Dorna Leitz, MD;  [facility];  Service: Orthopedics;  Laterality: Right;   TUBAL LIGATION  1990   VAGINAL PROLAPSE REPAIR N/A 05/03/2018   Procedure: VAULT PROLAPSE REPAIR WITH GRAFT;  Surgeon: Bjorn Loser, MD;  [facility];  Service: Urology;  Laterality: N/A;    MEDICATIONS: Current Outpatient Medications on File Prior to Visit  Medication Sig Dispense Refill   aspirin 81 MG EC tablet Take 1 tablet (81 mg total) by mouth daily with breakfast. Swallow whole. 30 tablet 11   atorvastatin (LIPITOR) 40 MG tablet Take 1  tablet (40 mg total) by mouth daily. 30 tablet 1   atorvastatin (LIPITOR) 40 MG tablet Take 1 tablet (40 mg total) by mouth daily. 30 tablet 1   clopidogrel (PLAVIX) 75 MG tablet Take 1 tablet (75 mg total) by mouth daily. 30 tablet 1   clopidogrel (PLAVIX) 75 MG tablet Take 1 tablet (75 mg total) by mouth daily. 30 tablet 1   Dulaglutide (TRULICITY) 7.34 LP/3.7TK SOPN Inject 0.75 mg into the skin once a week. (Patient not taking: Reported on 06/26/2021) 2 mL 1   levothyroxine (SYNTHROID) 150 MCG tablet Take 1 tablet (150 mcg total) by mouth in the morning on an empty stomach 30 tablet 2   levothyroxine (SYNTHROID) 175 MCG tablet Take 1 tablet (175 mcg total) by mouth every morning on an empty stomach 30 tablet 6   levothyroxine (SYNTHROID) 175 MCG tablet Take 1 tablet (175 mcg total) by mouth in the morning on an empty stomach. 30 tablet 6   pantoprazole (PROTONIX) 40 MG tablet TAKE 1 TABLET BY MOUTH ONCE DAILY 90 tablet 3   polyethylene glycol (MIRALAX / GLYCOLAX) 17 g packet Take 17 g by mouth daily.     No current  facility-administered medications on file prior to visit.    ALLERGIES: Allergies  Allergen Reactions   Metformin And Related Rash    Fingers turned  Blue.   Tape Itching    Surgical tape.   Levaquin [Levofloxacin] Rash   Penicillins Rash    Has patient had a PCN reaction causing immediate rash, facial/tongue/throat swelling, SOB or lightheadedness with hypotension: No Has patient had a PCN reaction causing severe rash involving mucus membranes or skin necrosis: No Has patient had a PCN reaction that required hospitalization No Has patient had a PCN reaction occurring within the last 10 years: No If all of the above answers are "NO", then may proceed with Cephalosporin use.     FAMILY HISTORY: Family History  Problem Relation Age of Onset   Colon cancer Mother    Cancer Mother        colon ca   Cancer Father        prostate   Hypertension Father    Hyperlipidemia  Father    Stroke Father    Prostate cancer Father    Cancer Paternal Grandmother        female organ ca    Objective:  Blood pressure 127/78, pulse 88, height 5\' 4"  (1.626 m), weight 215 lb 12.8 oz (97.9 kg), last menstrual period 04/13/2000, SpO2 99 %. General: No acute distress.  Patient appears well-groomed.   Head:  Normocephalic/atraumatic Eyes:  fundi examined but not visualized Neck: supple, no paraspinal tenderness, full range of motion Back: No paraspinal tenderness Heart: regular rate and rhythm Lungs: Clear to auscultation bilaterally. Vascular: No carotid bruits. Neurological Exam: Mental status: alert and oriented to person, place, and time, recent and remote memory intact, fund of knowledge intact, attention and concentration intact, speech fluent and not dysarthric, language intact. Cranial nerves: CN I: not tested CN II: pupils equal, round and reactive to light, visual fields intact CN III, IV, VI:  full range of motion, no nystagmus, no ptosis CN V: facial sensation intact. CN VII: upper and lower face symmetric CN VIII: hearing intact CN IX, X: gag intact, uvula midline CN XI: sternocleidomastoid and trapezius muscles intact CN XII: tongue midline Bulk & Tone: normal, no fasciculations. Motor:  muscle strength 5/5 throughout Sensation:  Pinprick, temperature and vibratory sensation intact. Deep Tendon Reflexes:  2+ throughout,  toes downgoing.   Finger to nose testing:  Without dysmetria.   Heel to shin:  Without dysmetria.   Gait:  Normal station and stride.  Romberg negative.    Thank you for allowing me to take part in the care of this patient.  Metta Clines, DO  CC: Delia Chimes, MD

## 2021-09-16 ENCOUNTER — Ambulatory Visit: Payer: 59 | Admitting: Neurology

## 2021-09-16 ENCOUNTER — Other Ambulatory Visit (INDEPENDENT_AMBULATORY_CARE_PROVIDER_SITE_OTHER): Payer: 59

## 2021-09-16 ENCOUNTER — Other Ambulatory Visit: Payer: Self-pay

## 2021-09-16 ENCOUNTER — Encounter: Payer: Self-pay | Admitting: Neurology

## 2021-09-16 VITALS — BP 127/78 | HR 88 | Ht 64.0 in | Wt 215.8 lb

## 2021-09-16 DIAGNOSIS — G5712 Meralgia paresthetica, left lower limb: Secondary | ICD-10-CM

## 2021-09-16 DIAGNOSIS — I639 Cerebral infarction, unspecified: Secondary | ICD-10-CM

## 2021-09-16 DIAGNOSIS — Z8669 Personal history of other diseases of the nervous system and sense organs: Secondary | ICD-10-CM | POA: Diagnosis not present

## 2021-09-16 LAB — LIPID PANEL
Cholesterol: 139 mg/dL (ref 0–200)
HDL: 59.9 mg/dL (ref >39.00)
LDL Cholesterol: 65 mg/dL (ref 0–99)
NonHDL: 79.02
Total CHOL/HDL Ratio: 2
Triglycerides: 70 mg/dL (ref 0.0–149.0)
VLDL: 14 mg/dL (ref 0.0–40.0)

## 2021-09-16 NOTE — Patient Instructions (Signed)
Stop Plavix.  Continue aspirin 81mg  daily Continue atorvastatin 40mg  daily.  Will check lipid panel Follow up with the eye doctor regarding the eye tearing Mediterranean diet (see below) Follow up 6 months  Mediterranean Diet A Mediterranean diet refers to food and lifestyle choices that are based on the traditions of countries located on the [employer]. This way of eating has been shown to help prevent certain conditions and improve outcomes for people who have chronic diseases, like kidney disease and heart disease. What are tips for following this plan? Lifestyle Cook and eat meals together with your family, when possible. Drink enough fluid to keep your urine clear or pale yellow. Be physically active every day. This includes: Aerobic exercise like running or swimming. Leisure activities like gardening, walking, or housework. Get 7-8 hours of sleep each night. If recommended by your health care provider, drink red wine in moderation. This means 1 glass a day for nonpregnant women and 2 glasses a day for men. A glass of wine equals 5 oz (150 mL). Reading food labels  Check the serving size of packaged foods. For foods such as rice and pasta, the serving size refers to the amount of cooked product, not dry. Check the total fat in packaged foods. Avoid foods that have saturated fat or trans fats. Check the ingredients list for added sugars, such as corn syrup. Shopping At the grocery store, buy most of your food from the areas near the walls of the store. This includes: Fresh fruits and vegetables (produce). Grains, beans, nuts, and seeds. Some of these may be available in unpackaged forms or large amounts (in bulk). Fresh seafood. Poultry and eggs. Low-fat dairy products. Buy whole ingredients instead of prepackaged foods. Buy fresh fruits and vegetables in-season from local farmers markets. Buy frozen fruits and vegetables in resealable bags. If you do not have access to  quality fresh seafood, buy precooked frozen shrimp or canned fish, such as tuna, salmon, or sardines. Buy small amounts of raw or cooked vegetables, salads, or olives from the deli or salad bar at your store. Stock your pantry so you always have certain foods on hand, such as olive oil, canned tuna, canned tomatoes, rice, pasta, and beans. Cooking Cook foods with extra-virgin olive oil instead of using butter or other vegetable oils. Have meat as a side dish, and have vegetables or grains as your main dish. This means having meat in small portions or adding small amounts of meat to foods like pasta or stew. Use beans or vegetables instead of meat in common dishes like chili or lasagna. Experiment with different cooking methods. Try roasting or broiling vegetables instead of steaming or sauteing them. Add frozen vegetables to soups, stews, pasta, or rice. Add nuts or seeds for added healthy fat at each meal. You can add these to yogurt, salads, or vegetable dishes. Marinate fish or vegetables using olive oil, lemon juice, garlic, and fresh herbs. Meal planning  Plan to eat 1 vegetarian meal one day each week. Try to work up to 2 vegetarian meals, if possible. Eat seafood 2 or more times a week. Have healthy snacks readily available, such as: Vegetable sticks with hummus. Greek yogurt. Fruit and nut trail mix. Eat balanced meals throughout the week. This includes: Fruit: 2-3 servings a day Vegetables: 4-5 servings a day Low-fat dairy: 2 servings a day Fish, poultry, or lean meat: 1 serving a day Beans and legumes: 2 or more servings a week Nuts and seeds: 1-2 servings a  day Whole grains: 6-8 servings a day Extra-virgin olive oil: 3-4 servings a day Limit red meat and sweets to only a few servings a month What are my food choices? Mediterranean diet Recommended Grains: Whole-grain pasta. Brown rice. Bulgar wheat. Polenta. Couscous. Whole-wheat bread. [place]. Vegetables:  Artichokes. Beets. Broccoli. Cabbage. Carrots. Eggplant. Green beans. Chard. Kale. Spinach. Onions. Leeks. Peas. Squash. Tomatoes. Peppers. Radishes. Fruits: Apples. Apricots. Avocado. Berries. Bananas. Cherries. Dates. Figs. Grapes. Lemons. Melon. Oranges. Peaches. Plums. Pomegranate. Meats and other protein foods: Beans. Almonds. Sunflower seeds. Pine nuts. Peanuts. [place]. Salmon. Scallops. Shrimp. Crystal. Tilapia. Clams. Oysters. Eggs. Dairy: Low-fat milk. Cheese. Greek yogurt. Beverages: Water. Red wine. Herbal tea. Fats and oils: Extra virgin olive oil. Avocado oil. Grape seed oil. Sweets and desserts: [place] yogurt with honey. Baked apples. Poached pears. Trail mix. Seasoning and other foods: Basil. Cilantro. Coriander. Cumin. Mint. Parsley. Sage. Rosemary. Tarragon. Garlic. Oregano. Thyme. Pepper. Balsalmic vinegar. Tahini. Hummus. Tomato sauce. Olives. Mushrooms. Limit these Grains: Prepackaged pasta or rice dishes. Prepackaged cereal with added sugar. Vegetables: Deep fried potatoes (french fries). Fruits: Fruit canned in syrup. Meats and other protein foods: Beef. Pork. Lamb. Poultry with skin. Hot dogs. Berniece Salines. Dairy: Ice cream. Sour cream. Whole milk. Beverages: Juice. Sugar-sweetened soft drinks. Beer. Liquor and spirits. Fats and oils: Butter. Canola oil. Vegetable oil. Beef fat (tallow). Lard. Sweets and desserts: Cookies. Cakes. Pies. Candy. Seasoning and other foods: Mayonnaise. Premade sauces and marinades. The items listed may not be a complete list. Talk with your dietitian about what dietary choices are right for you. Summary The Mediterranean diet includes both food and lifestyle choices. Eat a variety of fresh fruits and vegetables, beans, nuts, seeds, and whole grains. Limit the amount of red meat and sweets that you eat. Talk with your health care provider about whether it is safe for you to drink red wine in moderation. This means 1 glass a day for nonpregnant women and 2  glasses a day for men. A glass of wine equals 5 oz (150 mL). This information is not intended to replace advice given to you by your health care provider. Make sure you discuss any questions you have with your health care provider. Document Revised: 07/30/2016 Document Reviewed: 07/23/2016 Elsevier Patient Education  [place].

## 2021-09-18 DIAGNOSIS — E7800 Pure hypercholesterolemia, unspecified: Secondary | ICD-10-CM | POA: Diagnosis not present

## 2021-09-18 DIAGNOSIS — E559 Vitamin D deficiency, unspecified: Secondary | ICD-10-CM | POA: Diagnosis not present

## 2021-09-18 DIAGNOSIS — E890 Postprocedural hypothyroidism: Secondary | ICD-10-CM | POA: Diagnosis not present

## 2021-09-18 DIAGNOSIS — E1165 Type 2 diabetes mellitus with hyperglycemia: Secondary | ICD-10-CM | POA: Diagnosis not present

## 2021-09-25 ENCOUNTER — Other Ambulatory Visit (HOSPITAL_COMMUNITY): Payer: Self-pay

## 2021-09-25 DIAGNOSIS — E1165 Type 2 diabetes mellitus with hyperglycemia: Secondary | ICD-10-CM | POA: Diagnosis not present

## 2021-09-25 DIAGNOSIS — C73 Malignant neoplasm of thyroid gland: Secondary | ICD-10-CM | POA: Diagnosis not present

## 2021-09-25 DIAGNOSIS — E7800 Pure hypercholesterolemia, unspecified: Secondary | ICD-10-CM | POA: Diagnosis not present

## 2021-09-25 DIAGNOSIS — E890 Postprocedural hypothyroidism: Secondary | ICD-10-CM | POA: Diagnosis not present

## 2021-09-25 DIAGNOSIS — I10 Essential (primary) hypertension: Secondary | ICD-10-CM | POA: Diagnosis not present

## 2021-09-25 DIAGNOSIS — E559 Vitamin D deficiency, unspecified: Secondary | ICD-10-CM | POA: Diagnosis not present

## 2021-09-25 MED ORDER — VITAMIN D (ERGOCALCIFEROL) 1.25 MG (50000 UNIT) PO CAPS
ORAL_CAPSULE | ORAL | 4 refills | Status: DC
  Filled 2021-09-25: qty 12, 84d supply, fill #0
  Filled 2021-12-29: qty 12, 84d supply, fill #1
  Filled 2022-04-27: qty 12, 84d supply, fill #2
  Filled 2022-07-27: qty 12, 84d supply, fill #3

## 2021-09-25 MED ORDER — SYNTHROID 200 MCG PO TABS
200.0000 ug | ORAL_TABLET | Freq: Every morning | ORAL | 6 refills | Status: DC
  Filled 2021-09-25: qty 30, 30d supply, fill #0
  Filled 2021-10-27: qty 30, 30d supply, fill #1

## 2021-09-25 MED ORDER — MOUNJARO 2.5 MG/0.5ML ~~LOC~~ SOAJ
2.5000 mg | SUBCUTANEOUS | 1 refills | Status: DC
  Filled 2021-09-25: qty 2, 28d supply, fill #0

## 2021-09-26 ENCOUNTER — Other Ambulatory Visit (HOSPITAL_COMMUNITY): Payer: Self-pay

## 2021-10-15 ENCOUNTER — Other Ambulatory Visit (HOSPITAL_COMMUNITY): Payer: Self-pay

## 2021-10-20 ENCOUNTER — Other Ambulatory Visit (HOSPITAL_COMMUNITY): Payer: Self-pay

## 2021-10-20 MED ORDER — MOUNJARO 5 MG/0.5ML ~~LOC~~ SOAJ
5.0000 mg | SUBCUTANEOUS | 6 refills | Status: DC
  Filled 2021-10-20: qty 2, 28d supply, fill #0

## 2021-10-27 ENCOUNTER — Other Ambulatory Visit (HOSPITAL_COMMUNITY): Payer: Self-pay

## 2021-11-20 ENCOUNTER — Other Ambulatory Visit (HOSPITAL_COMMUNITY): Payer: Self-pay

## 2021-11-20 MED ORDER — MOUNJARO 7.5 MG/0.5ML ~~LOC~~ SOAJ
7.5000 mg | SUBCUTANEOUS | 6 refills | Status: DC
  Filled 2021-11-20: qty 2, 28d supply, fill #0

## 2021-11-26 DIAGNOSIS — E890 Postprocedural hypothyroidism: Secondary | ICD-10-CM | POA: Diagnosis not present

## 2021-11-26 DIAGNOSIS — E559 Vitamin D deficiency, unspecified: Secondary | ICD-10-CM | POA: Diagnosis not present

## 2021-11-27 ENCOUNTER — Other Ambulatory Visit (HOSPITAL_COMMUNITY): Payer: Self-pay

## 2021-11-27 MED ORDER — LEVOTHYROXINE SODIUM 112 MCG PO TABS
224.0000 ug | ORAL_TABLET | Freq: Every day | ORAL | 6 refills | Status: DC
  Filled 2021-11-27: qty 60, 30d supply, fill #0
  Filled 2021-12-29: qty 60, 30d supply, fill #1

## 2021-11-28 ENCOUNTER — Other Ambulatory Visit (HOSPITAL_COMMUNITY): Payer: Self-pay

## 2021-12-29 ENCOUNTER — Other Ambulatory Visit (HOSPITAL_COMMUNITY): Payer: Self-pay

## 2021-12-29 ENCOUNTER — Other Ambulatory Visit: Payer: Self-pay | Admitting: Neurology

## 2021-12-29 MED ORDER — ATORVASTATIN CALCIUM 40 MG PO TABS
40.0000 mg | ORAL_TABLET | Freq: Every day | ORAL | 11 refills | Status: DC
  Filled 2021-12-29: qty 30, 30d supply, fill #0
  Filled 2022-02-17: qty 30, 30d supply, fill #1
  Filled 2022-04-27: qty 30, 30d supply, fill #2
  Filled 2022-07-27: qty 30, 30d supply, fill #3
  Filled 2022-08-21: qty 30, 30d supply, fill #4
  Filled 2022-09-22: qty 30, 30d supply, fill #5
  Filled 2022-10-26: qty 30, 30d supply, fill #6
  Filled 2022-11-20: qty 30, 30d supply, fill #7

## 2021-12-30 ENCOUNTER — Telehealth: Payer: Self-pay

## 2021-12-30 NOTE — Telephone Encounter (Signed)
She called and left a message. She is asking if she should schedule annual visit with you? Last visit on 11/14/20.

## 2021-12-30 NOTE — Telephone Encounter (Signed)
Called back and given below message. She verbalized understanding. Scheduled appt on 1/23 at 0940 per her request.

## 2021-12-30 NOTE — Telephone Encounter (Signed)
I can see her Thursday morning or next week See me 20 mins, do not order labs yet until after I see her

## 2022-01-05 ENCOUNTER — Other Ambulatory Visit: Payer: Self-pay

## 2022-01-05 ENCOUNTER — Inpatient Hospital Stay: Payer: 59 | Attending: Hematology and Oncology | Admitting: Hematology and Oncology

## 2022-01-05 ENCOUNTER — Encounter: Payer: Self-pay | Admitting: Hematology and Oncology

## 2022-01-05 ENCOUNTER — Inpatient Hospital Stay: Payer: 59

## 2022-01-05 ENCOUNTER — Telehealth: Payer: Self-pay

## 2022-01-05 VITALS — BP 136/99 | HR 82 | Temp 97.8°F | Resp 18 | Ht 64.0 in | Wt 205.4 lb

## 2022-01-05 DIAGNOSIS — Z9884 Bariatric surgery status: Secondary | ICD-10-CM

## 2022-01-05 DIAGNOSIS — D519 Vitamin B12 deficiency anemia, unspecified: Secondary | ICD-10-CM

## 2022-01-05 DIAGNOSIS — Z8572 Personal history of non-Hodgkin lymphomas: Secondary | ICD-10-CM | POA: Insufficient documentation

## 2022-01-05 DIAGNOSIS — D509 Iron deficiency anemia, unspecified: Secondary | ICD-10-CM

## 2022-01-05 DIAGNOSIS — M20091 Other deformity of right finger(s): Secondary | ICD-10-CM

## 2022-01-05 DIAGNOSIS — Z8579 Personal history of other malignant neoplasms of lymphoid, hematopoietic and related tissues: Secondary | ICD-10-CM

## 2022-01-05 DIAGNOSIS — S6991XA Unspecified injury of right wrist, hand and finger(s), initial encounter: Secondary | ICD-10-CM

## 2022-01-05 LAB — CBC WITH DIFFERENTIAL (CANCER CENTER ONLY)
Abs Immature Granulocytes: 0.01 10*3/uL (ref 0.00–0.07)
Basophils Absolute: 0 10*3/uL (ref 0.0–0.1)
Basophils Relative: 1 %
Eosinophils Absolute: 0.2 10*3/uL (ref 0.0–0.5)
Eosinophils Relative: 6 %
HCT: 36.7 % (ref 36.0–46.0)
Hemoglobin: 12.1 g/dL (ref 12.0–15.0)
Immature Granulocytes: 0 %
Lymphocytes Relative: 32 %
Lymphs Abs: 1.1 10*3/uL (ref 0.7–4.0)
MCH: 27.8 pg (ref 26.0–34.0)
MCHC: 33 g/dL (ref 30.0–36.0)
MCV: 84.4 fL (ref 80.0–100.0)
Monocytes Absolute: 0.3 10*3/uL (ref 0.1–1.0)
Monocytes Relative: 8 %
Neutro Abs: 1.8 10*3/uL (ref 1.7–7.7)
Neutrophils Relative %: 53 %
Platelet Count: 281 10*3/uL (ref 150–400)
RBC: 4.35 MIL/uL (ref 3.87–5.11)
RDW: 14 % (ref 11.5–15.5)
WBC Count: 3.5 10*3/uL — ABNORMAL LOW (ref 4.0–10.5)
nRBC: 0 % (ref 0.0–0.2)

## 2022-01-05 LAB — CMP (CANCER CENTER ONLY)
ALT: 16 U/L (ref 0–44)
AST: 18 U/L (ref 15–41)
Albumin: 3.9 g/dL (ref 3.5–5.0)
Alkaline Phosphatase: 66 U/L (ref 38–126)
Anion gap: 6 (ref 5–15)
BUN: 12 mg/dL (ref 6–20)
CO2: 30 mmol/L (ref 22–32)
Calcium: 9 mg/dL (ref 8.9–10.3)
Chloride: 103 mmol/L (ref 98–111)
Creatinine: 0.9 mg/dL (ref 0.44–1.00)
GFR, Estimated: >60 mL/min (ref >60)
Glucose, Bld: 98 mg/dL (ref 70–99)
Potassium: 3.9 mmol/L (ref 3.5–5.1)
Sodium: 139 mmol/L (ref 135–145)
Total Bilirubin: 0.8 mg/dL (ref 0.3–1.2)
Total Protein: 7.6 g/dL (ref 6.5–8.1)

## 2022-01-05 LAB — IRON AND IRON BINDING CAPACITY (CC-WL,HP ONLY)
Iron: 77 ug/dL (ref 28–170)
Saturation Ratios: 19 % (ref 10.4–31.8)
TIBC: 412 ug/dL (ref 250–450)
UIBC: 335 ug/dL (ref 148–442)

## 2022-01-05 LAB — VITAMIN B12: Vitamin B-12: 561 pg/mL (ref 180–914)

## 2022-01-05 LAB — FERRITIN: Ferritin: 40 ng/mL (ref 11–307)

## 2022-01-05 NOTE — Assessment & Plan Note (Signed)
She has no clinical signs and symptoms of recurrence She does not need long-term follow-up or imaging study

## 2022-01-05 NOTE — Progress Notes (Signed)
[place] OFFICE PROGRESS NOTE  Patient Care Team: Forrest Moron, MD as PCP - General (Internal Medicine) Servando Salina, MD as PCP - OBGYN (Obstetrics and Gynecology) Heath Lark, MD as Consulting Physician (Hematology and Oncology) Fanny Skates, MD as Consulting Physician (General Surgery) Ronald Lobo, MD as Consulting Physician (Gastroenterology)  ASSESSMENT & PLAN:  History of lymphoma She has no clinical signs and symptoms of recurrence She does not need long-term follow-up or imaging study  Other deformity of right finger(s) She has accidental surgery to her right middle finger I recommend orthopedic consult/hand surgery  H/O laparoscopic adjustable gastric banding She had history of gastric banding that could affect mineral absorption Repeat iron studies and vitamin B12 are adequate She is reassured  Orders Placed This Encounter  Procedures   CMP ([place] only)    Standing Status:   Future    Number of Occurrences:   1    Standing Expiration Date:   01/05/2023   CBC with Differential (Cancer Center Only)    Standing Status:   Future    Number of Occurrences:   1    Standing Expiration Date:   01/05/2023   Vitamin B12    Standing Status:   Future    Number of Occurrences:   1    Standing Expiration Date:   01/05/2023   Ferritin    Standing Status:   Future    Number of Occurrences:   1    Standing Expiration Date:   01/05/2023   Iron and Iron Binding Capacity ([facility] only)    Standing Status:   Future    Number of Occurrences:   1    Standing Expiration Date:   01/05/2023    All questions were answered. The patient knows to call the clinic with any problems, questions or concerns. The total time spent in the appointment was 20 minutes encounter with patients including review of chart and various tests results, discussions about plan of care and coordination of care plan   Heath Lark, MD 01/05/2022 12:20 PM  INTERVAL  HISTORY: Please see below for problem oriented charting. she returns for surveillance follow-up She accidentally injured her right middle finger recently She has no new lymphadenopathy Previously, she has severe abdominal pain and was found to have problems with her previous laparoscopic gastric banding.  That has since been removed Her symptoms resolved  REVIEW OF SYSTEMS:   Constitutional: Denies fevers, chills or abnormal weight loss Eyes: Denies blurriness of vision Ears, nose, mouth, throat, and face: Denies mucositis or sore throat Respiratory: Denies cough, dyspnea or wheezes Cardiovascular: Denies palpitation, chest discomfort or lower extremity swelling Gastrointestinal:  Denies nausea, heartburn or change in bowel habits Skin: Denies abnormal skin rashes Lymphatics: Denies new lymphadenopathy or easy bruising Neurological:Denies numbness, tingling or new weaknesses Behavioral/Psych: Mood is stable, no new changes  All other systems were reviewed with the patient and are negative.  I have reviewed the past medical history, past surgical history, social history and family history with the patient and they are unchanged from previous note.  ALLERGIES:  is allergic to metformin and related, tape, levaquin [levofloxacin], and penicillins.  MEDICATIONS:  Current Outpatient Medications  Medication Sig Dispense Refill   tirzepatide (MOUNJARO) 10 MG/0.5ML Pen Inject 10 mg into the skin once a week.     aspirin 81 MG EC tablet Take 1 tablet (81 mg total) by mouth daily with breakfast. Swallow whole. 30 tablet 11   atorvastatin (LIPITOR) 40  MG tablet Take 1 tablet (40 mg total) by mouth daily. 30 tablet 11   levothyroxine (SYNTHROID) 112 MCG tablet Take 2 tablets (224 mcg total) by mouth daily. 60 tablet 6   polyethylene glycol (MIRALAX / GLYCOLAX) 17 g packet Take 17 g by mouth daily.     Vitamin D, Ergocalciferol, (DRISDOL) 1.25 MG (50000 UNIT) CAPS capsule Take 1 capsule by mouth  once a week. 12 capsule 4   No current facility-administered medications for this visit.    SUMMARY OF ONCOLOGIC HISTORY: Oncology History Overview Note  DLBCL (diffuse large B cell lymphoma)   Staging form: Lymphoid Neoplasms, AJCC 6th Edition     Clinical stage from 11/15/2014: Stage II - Signed by Heath Lark, MD on 11/15/2014     History of lymphoma  09/14/2014 Imaging   PET/CT scan showed Hypermetabolic bilateral cervical lymphadenopathy, left side greater than right, and sub-cm hypermetabolic left subpectoral lymph node. Hypermetabolic activity throughout the lingular and palatine tonsils in Waldeyer's ring   10/12/2014 Procedure   Biopsy of the left neck lymph node came back positive for diffuse large B-cell lymphoma.Accession: [id number]   10/24/2014 Procedure   PAC placed.   10/24/2014 Bone Marrow Biopsy   Bone marrow biopsy was negative.Cytogenetics were normal   10/24/2014 Imaging   Echocardiogram showed normal ejection fraction   10/25/2014 - 12/06/2014 Chemotherapy   She received 3 cyles of R CHOP chemotherapy. Further doses of R CHOP chemotherapy was reduced due to recent neutropenic fever with cycle 1 of treatment.   11/01/2014 Adverse Reaction   She was seen in the emergency Department for possible neutropenic fever. Cultures were negative. She received antibiotic therapy.   11/15/2014 - 12/06/2014 Chemotherapy   Intrathecal chemotherapy is added due to risk of CNS relapse   01/04/2015 Imaging   PET CT scan show complete remission from lymphadenopathy. Incidental finding of right shoulder arthropathy   01/16/2015 Imaging   MRI of the right shoulder show no evidence of cancer, with evidence of tendon tear and possible changes related to trauma   02/05/2015 - 02/27/2015 Radiation Therapy   IMRT to tonsils, neck and L subpectoral nodal bed:  30.6 Gy in 17 fractions.   04/25/2015 Imaging   CT neck, chest, abdomen/pelvis:  No findings for active lymphomatous  involvement.   07/16/2015 Imaging   PET/ CT scans show no evidence of lymphoma. It showed persistent abnormality in the right humeral area   10/18/2015 Procedure   PAC removed.   03/26/2016 Imaging   CT scan of chest, abdomen and pelvis showed no evidence of lymphoma. Noted changes suspicious of infection   03/26/2016 Imaging   CT neck showed asymmetry of the lower palatine tonsils with slight fullness on the left and underfilling of air column at this level.   06/19/2016 Procedure   EGD and colonoscopy showed nonerosive esophagitis   01/11/2017 Imaging   No evidence of recurrent lymphoma or other acute findings within the thorax.   10/06/2017 PET scan   1. No hypermetabolic lymphadenopathy or other definite findings of recurrent lymphoma. 2. New mild patchy hypermetabolism in the left hip joint with associated asymmetric osteoarthritis on the CT images, most compatible with degenerative uptake. No discrete joint erosions. 3. New symmetric uniform hypermetabolism in the bilateral parotid glands, without discrete parotid mass on the CT images, favoring inflammatory/physiologic uptake. 4. Symmetric mild hypermetabolism in the palatine tonsils and base of tongue bilaterally, without discrete mass correlate on the CT images, favoring inflammatory/physiologic uptake.   04/21/2018 PET  scan   1. No evidence of lymphoma recurrence. 2. Stable hypermetabolic activity in the lingual tonsils and parotid glands is favored physiologic.     PHYSICAL EXAMINATION: ECOG PERFORMANCE STATUS: 1 - Symptomatic but completely ambulatory  Vitals:   01/05/22 0926  BP: (!) 136/99  Pulse: 82  Resp: 18  Temp: 97.8 F (36.6 C)  SpO2: 100%   Filed Weights   01/05/22 0926  Weight: 205 lb 6.4 oz (93.2 kg)    GENERAL:alert, no distress and comfortable SKIN: skin color, texture, turgor are normal, no rashes or significant lesions EYES: normal, Conjunctiva are pink and non-injected, sclera clear OROPHARYNX:no  exudate, no erythema and lips, buccal mucosa, and tongue normal  NECK: Noted well-healed surgical scarnoted well healed scar LYMPH:  no palpable lymphadenopathy in the cervical, axillary or inguinal LUNGS: clear to auscultation and percussion with normal breathing effort HEART: regular rate & rhythm and no murmurs and no lower extremity edema ABDOMEN:abdomen soft, non-tender and normal bowel sounds Musculoskeletal:no cyanosis of digits and no clubbing.  Noted deformity in the right middle finger NEURO: alert & oriented x 3 with fluent speech, no focal motor/sensory deficits  LABORATORY DATA:  I have reviewed the data as listed    Component Value Date/Time   NA 139 01/05/2022 0947   NA 142 09/24/2017 1438   K 3.9 01/05/2022 0947   K 3.6 09/24/2017 1438   CL 103 01/05/2022 0947   CL 102 08/31/2012 1336   CO2 30 01/05/2022 0947   CO2 28 09/24/2017 1438   GLUCOSE 98 01/05/2022 0947   GLUCOSE 92 09/24/2017 1438   GLUCOSE 95 08/31/2012 1336   BUN 12 01/05/2022 0947   BUN 12.6 09/24/2017 1438   CREATININE 0.90 01/05/2022 0947   CREATININE 0.9 09/24/2017 1438   CALCIUM 9.0 01/05/2022 0947   CALCIUM 9.0 09/24/2017 1438   PROT 7.6 01/05/2022 0947   PROT 6.7 09/24/2017 1438   ALBUMIN 3.9 01/05/2022 0947   ALBUMIN 3.5 09/24/2017 1438   AST 18 01/05/2022 0947   AST 15 09/24/2017 1438   ALT 16 01/05/2022 0947   ALT 8 09/24/2017 1438   ALKPHOS 66 01/05/2022 0947   ALKPHOS 73 09/24/2017 1438   BILITOT 0.8 01/05/2022 0947   BILITOT 0.35 09/24/2017 1438   GFRNONAA >60 01/05/2022 0947   GFRAA >60 09/08/2019 1013    No results found for: SPEP, UPEP  Lab Results  Component Value Date   WBC 3.5 (L) 01/05/2022   NEUTROABS 1.8 01/05/2022   HGB 12.1 01/05/2022   HCT 36.7 01/05/2022   MCV 84.4 01/05/2022   PLT 281 01/05/2022      Chemistry      Component Value Date/Time   NA 139 01/05/2022 0947   NA 142 09/24/2017 1438   K 3.9 01/05/2022 0947   K 3.6 09/24/2017 1438   CL 103  01/05/2022 0947   CL 102 08/31/2012 1336   CO2 30 01/05/2022 0947   CO2 28 09/24/2017 1438   BUN 12 01/05/2022 0947   BUN 12.6 09/24/2017 1438   CREATININE 0.90 01/05/2022 0947   CREATININE 0.9 09/24/2017 1438      Component Value Date/Time   CALCIUM 9.0 01/05/2022 0947   CALCIUM 9.0 09/24/2017 1438   ALKPHOS 66 01/05/2022 0947   ALKPHOS 73 09/24/2017 1438   AST 18 01/05/2022 0947   AST 15 09/24/2017 1438   ALT 16 01/05/2022 0947   ALT 8 09/24/2017 1438   BILITOT 0.8 01/05/2022 0947   BILITOT  0.35 09/24/2017 1438

## 2022-01-05 NOTE — Telephone Encounter (Signed)
Faxed referral to [facility] to hand specialist at [phone], received confirmation.

## 2022-01-05 NOTE — Assessment & Plan Note (Signed)
She had history of gastric banding that could affect mineral absorption Repeat iron studies and vitamin B12 are adequate She is reassured

## 2022-01-05 NOTE — Assessment & Plan Note (Signed)
She has accidental surgery to her right middle finger I recommend orthopedic consult/hand surgery

## 2022-01-28 DIAGNOSIS — M79642 Pain in left hand: Secondary | ICD-10-CM | POA: Diagnosis not present

## 2022-01-28 DIAGNOSIS — M20011 Mallet finger of right finger(s): Secondary | ICD-10-CM | POA: Diagnosis not present

## 2022-01-28 DIAGNOSIS — E890 Postprocedural hypothyroidism: Secondary | ICD-10-CM | POA: Diagnosis not present

## 2022-01-28 DIAGNOSIS — M79645 Pain in left finger(s): Secondary | ICD-10-CM | POA: Diagnosis not present

## 2022-01-28 DIAGNOSIS — M79644 Pain in right finger(s): Secondary | ICD-10-CM | POA: Diagnosis not present

## 2022-01-29 ENCOUNTER — Other Ambulatory Visit (HOSPITAL_COMMUNITY): Payer: Self-pay

## 2022-01-29 MED ORDER — LEVOTHYROXINE SODIUM 125 MCG PO TABS
250.0000 ug | ORAL_TABLET | Freq: Every day | ORAL | 6 refills | Status: DC
  Filled 2022-01-29: qty 60, 30d supply, fill #0
  Filled 2022-03-03: qty 60, 30d supply, fill #1
  Filled 2022-04-27: qty 60, 30d supply, fill #2
  Filled 2022-07-27: qty 60, 30d supply, fill #3
  Filled 2022-08-21: qty 60, 30d supply, fill #4
  Filled 2022-09-22: qty 60, 30d supply, fill #5

## 2022-02-05 DIAGNOSIS — M79642 Pain in left hand: Secondary | ICD-10-CM | POA: Diagnosis not present

## 2022-02-11 DIAGNOSIS — M79645 Pain in left finger(s): Secondary | ICD-10-CM | POA: Diagnosis not present

## 2022-02-11 DIAGNOSIS — N8110 Cystocele, unspecified: Secondary | ICD-10-CM | POA: Diagnosis not present

## 2022-02-11 DIAGNOSIS — Z90710 Acquired absence of both cervix and uterus: Secondary | ICD-10-CM | POA: Diagnosis not present

## 2022-02-11 DIAGNOSIS — Z01419 Encounter for gynecological examination (general) (routine) without abnormal findings: Secondary | ICD-10-CM | POA: Diagnosis not present

## 2022-02-11 DIAGNOSIS — M79644 Pain in right finger(s): Secondary | ICD-10-CM | POA: Diagnosis not present

## 2022-02-11 DIAGNOSIS — M79642 Pain in left hand: Secondary | ICD-10-CM | POA: Diagnosis not present

## 2022-02-11 DIAGNOSIS — R8290 Unspecified abnormal findings in urine: Secondary | ICD-10-CM | POA: Diagnosis not present

## 2022-02-11 DIAGNOSIS — M20011 Mallet finger of right finger(s): Secondary | ICD-10-CM | POA: Diagnosis not present

## 2022-02-11 DIAGNOSIS — S5332XA Traumatic rupture of left ulnar collateral ligament, initial encounter: Secondary | ICD-10-CM | POA: Diagnosis not present

## 2022-02-17 ENCOUNTER — Other Ambulatory Visit (HOSPITAL_COMMUNITY): Payer: Self-pay

## 2022-03-02 ENCOUNTER — Other Ambulatory Visit (HOSPITAL_COMMUNITY): Payer: Self-pay

## 2022-03-02 MED ORDER — MOUNJARO 10 MG/0.5ML ~~LOC~~ SOAJ
10.0000 mg | SUBCUTANEOUS | 6 refills | Status: DC
Start: 2022-03-02 — End: 2023-05-31
  Filled 2022-03-02: qty 2, 28d supply, fill #0
  Filled 2022-04-06: qty 2, 28d supply, fill #1
  Filled 2022-05-06: qty 2, 28d supply, fill #2
  Filled 2022-06-12 – 2022-06-25 (×2): qty 2, 28d supply, fill #3
  Filled 2022-08-21: qty 2, 28d supply, fill #4
  Filled 2022-09-22: qty 2, 28d supply, fill #5
  Filled 2022-10-26: qty 2, 28d supply, fill #6

## 2022-03-03 ENCOUNTER — Other Ambulatory Visit (HOSPITAL_COMMUNITY): Payer: Self-pay

## 2022-03-11 DIAGNOSIS — S5332XA Traumatic rupture of left ulnar collateral ligament, initial encounter: Secondary | ICD-10-CM | POA: Diagnosis not present

## 2022-03-11 DIAGNOSIS — M20011 Mallet finger of right finger(s): Secondary | ICD-10-CM | POA: Diagnosis not present

## 2022-03-25 NOTE — Progress Notes (Signed)
? ?NEUROLOGY FOLLOW UP OFFICE NOTE ? ?Effie Shy [place] ?[id number] ? ?Assessment/Plan:  ? ?Possible cerebellar transient ischemic attack ?Hyperlipidemia ? ?1  Secondary stroke prevention as managed by PCP: ? ASA '81mg'$  daily ? Atorvastatin '40mg'$  daily.  LDL goal less than 70 ? Glycemic control.  Hgb A1c goal less than 7 ? Normotensive blood pressure ?2  Mediterranean diet and routine exercise ?3  Follow up as needed ? ?  ?Subjective:  ?RAESHAWN TAFOLLA is a 56 year old right-handed female with hypothyroidism s/p surgical management for thyroid cancer and history of diffuse large B cell lymphoma who follows up for TIA. ? ?UPDATE: ?Current medications:  ASA '81mg'$ , atorvastatin '40mg'$ , ?LDL from October was 65 ? ? ?HISTORY:  ?On the evening of 06/05/2021, she started to exhibit some slurred speech and felt a little dizzy.  She thought that she may have been dehydrated so she took a shower and went to bed.  The next morning, she had vertigo (spinning sensation) with ataxia, difficulty getting words out, nausea, vomiting and headache.  She has prior history of vertigo but nothing like this.  She denies history of headaches.  She presented to [facility] where CT head was negative.  She was transferred to [facility] for TIA/CVA workup.  MRI of brain was negative for acute stroke.  MRA of head and neck was negative for LVO or hemodynamically significant stenosis.  Carotid ultrasound showed 1-39% bilateral ICA stenosis and antegrade flow of the bilateral vertebral arteries.  Echocardiogram showed EF 60-65% with no cardiac source of emboli.  Telemetry revealed no a fib.  LDL was 187, Hgb A1c was 6.8 and HIV and Sars Coronvirus 2 were negative.  TSH was initially found to be 80.584 so her Synthroid was adjusted.  She was not on antithrombotic therapy or statin prior to admission.  She was discharged on ASA '18mg'$  daily, Plavix '75mg'$  daily and atorvastatin '40mg'$  daily.  Vertigo resolved after several hours but she  felt unsteady with headaches for the next few days.  She remains on ASA, Plavix and atorvastatin. ?  ?Since the TIA, she reports increased tearing of her left eye.  She has remote history of left sided Bell's palsy in 2005 and previously experienced gustatory tearing, but it has been worse. And occurring frequently.  She also notes numbness involving the left lateral thigh. ? ?PAST MEDICAL HISTORY: ?Past Medical History:  ?Diagnosis Date  ? Arthritis of shoulder region, right, degenerative   ? right shoulder   ? Bladder prolapse, female, acquired   ? 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 Change in bowel habits 03/19/2016  ? Constipation   ? DLBCL (diffuse large B cell lymphoma) ([place])   ? History of B-cell lymphoma 11/18/2015  ? Hypothyroidism   ? Neutropenic fever ([place]) 11/01/2014  ? PONV (postoperative nausea and vomiting)   ? during hysterectomy   ? S/P radiation therapy 02/05/15-02/27/15  ? tonsil/neck,lt subpectoral nodal bed 30.6Gy /17 fx  ? Thyroid cancer ([place])   ? Wears glasses   ? ? ?MEDICATIONS: ?Current Outpatient Medications on File Prior to Visit  ?Medication Sig Dispense Refill  ? aspirin 81 MG EC tablet Take 1 tablet (81 mg total) by mouth daily with breakfast. Swallow whole. 30 tablet 11  ? atorvastatin (LIPITOR) 40 MG tablet Take 1 tablet (40 mg total) by mouth daily. 30 tablet 11  ? levothyroxine (SYNTHROID) 112 MCG tablet Take 2 tablets (224 mcg total) by mouth daily. 60 tablet  6  ? levothyroxine (SYNTHROID) 125 MCG tablet Take 2 tablets (250 mcg total) by mouth daily. 60 tablet 6  ? polyethylene glycol (MIRALAX / GLYCOLAX) 17 g packet Take 17 g by mouth daily.    ? tirzepatide (MOUNJARO) 10 MG/0.5ML Pen Inject 10 mg into the skin once a week.    ? tirzepatide (MOUNJARO) 10 MG/0.5ML Pen Inject 10 mg into the skin once a week. 2 mL 6  ? Vitamin D, Ergocalciferol, (DRISDOL) 1.25 MG (50000 UNIT) CAPS capsule Take 1 capsule by mouth once a week. 12 capsule 4  ? ?No current facility-administered  medications on file prior to visit.  ? ? ?ALLERGIES: ?Allergies  ?Allergen Reactions  ? Metformin And Related Rash  ?  Fingers turned  Blue.  ? Tape Itching  ?  Surgical tape.  ? Levaquin [Levofloxacin] Rash  ? Penicillins Rash  ?  Has patient had a PCN reaction causing immediate rash, facial/tongue/throat swelling, SOB or lightheadedness with hypotension: No ?Has patient had a PCN reaction causing severe rash involving mucus membranes or skin necrosis: No ?Has patient had a PCN reaction that required hospitalization No ?Has patient had a PCN reaction occurring within the last 10 years: No ?If all of the above answers are "NO", then may proceed with Cephalosporin use. ?  ? ? ?FAMILY HISTORY: ?Family History  ?Problem Relation Age of Onset  ? Colon cancer Mother   ? Cancer Mother   ?     colon ca  ? Cancer Father   ?     prostate  ? Hypertension Father   ? Hyperlipidemia Father   ? Stroke Father   ? Prostate cancer Father   ? Migraines Brother   ? Cerebral aneurysm Maternal Grandmother   ? Heart disease Maternal Grandmother   ? Heart attack Maternal Grandmother   ? Cancer Paternal Grandmother   ?     female organ ca  ? Alzheimer's disease Paternal Grandfather   ? ? ?  ?Objective:  ?Blood pressure 138/85, pulse 100, height '5\' 4"'$  (1.626 m), weight 206 lb 9.6 oz (93.7 kg), last menstrual period 04/13/2000, SpO2 97 %. ?General: No acute distress.  Patient appears well-groomed.   ?Head:  Normocephalic/atraumatic ?Eyes:  Fundi examined but not visualized ?Heart:  Regular rate and rhythm ?Neurological Exam: alert and oriented to person, place, and time.  Speech fluent and not dysarthric, language intact.  CN II-XII intact. Bulk and tone normal, muscle strength 5/5 throughout.  Sensation to pinprick and vibration intact.  Deep tendon reflexes 2+ throughout, toes downgoing.  Finger to nose testing intact.  Gait normal, Romberg negative. ? ? ?Metta Clines, DO ? ?CC: Delia Chimes, MD ? ? ? ? ? ? ?

## 2022-03-26 ENCOUNTER — Ambulatory Visit: Payer: 59 | Admitting: Neurology

## 2022-03-26 ENCOUNTER — Encounter: Payer: Self-pay | Admitting: Neurology

## 2022-03-26 VITALS — BP 138/85 | HR 100 | Ht 64.0 in | Wt 206.6 lb

## 2022-03-26 DIAGNOSIS — E1165 Type 2 diabetes mellitus with hyperglycemia: Secondary | ICD-10-CM | POA: Diagnosis not present

## 2022-03-26 DIAGNOSIS — E785 Hyperlipidemia, unspecified: Secondary | ICD-10-CM | POA: Diagnosis not present

## 2022-03-26 DIAGNOSIS — E890 Postprocedural hypothyroidism: Secondary | ICD-10-CM | POA: Diagnosis not present

## 2022-03-26 DIAGNOSIS — E559 Vitamin D deficiency, unspecified: Secondary | ICD-10-CM | POA: Diagnosis not present

## 2022-03-26 DIAGNOSIS — E7800 Pure hypercholesterolemia, unspecified: Secondary | ICD-10-CM | POA: Diagnosis not present

## 2022-03-26 DIAGNOSIS — G459 Transient cerebral ischemic attack, unspecified: Secondary | ICD-10-CM

## 2022-03-26 NOTE — Patient Instructions (Signed)
Continue aspirin '81mg'$  daily and atorvastatin '40mg'$  daily as managed by your PCP ?Follow up as needed ?

## 2022-04-02 ENCOUNTER — Other Ambulatory Visit (HOSPITAL_COMMUNITY): Payer: Self-pay

## 2022-04-02 DIAGNOSIS — K5900 Constipation, unspecified: Secondary | ICD-10-CM | POA: Diagnosis not present

## 2022-04-02 DIAGNOSIS — E890 Postprocedural hypothyroidism: Secondary | ICD-10-CM | POA: Diagnosis not present

## 2022-04-02 DIAGNOSIS — E1165 Type 2 diabetes mellitus with hyperglycemia: Secondary | ICD-10-CM | POA: Diagnosis not present

## 2022-04-02 DIAGNOSIS — I10 Essential (primary) hypertension: Secondary | ICD-10-CM | POA: Diagnosis not present

## 2022-04-02 DIAGNOSIS — C73 Malignant neoplasm of thyroid gland: Secondary | ICD-10-CM | POA: Diagnosis not present

## 2022-04-02 DIAGNOSIS — E7800 Pure hypercholesterolemia, unspecified: Secondary | ICD-10-CM | POA: Diagnosis not present

## 2022-04-02 DIAGNOSIS — E559 Vitamin D deficiency, unspecified: Secondary | ICD-10-CM | POA: Diagnosis not present

## 2022-04-02 MED ORDER — MOUNJARO 10 MG/0.5ML ~~LOC~~ SOAJ
10.0000 mg | SUBCUTANEOUS | 6 refills | Status: DC
Start: 2022-04-02 — End: 2023-04-19
  Filled 2022-04-02 – 2022-07-27 (×2): qty 2, 28d supply, fill #0
  Filled 2022-11-20: qty 2, 28d supply, fill #1
  Filled 2022-12-21: qty 2, 28d supply, fill #2
  Filled 2023-01-18: qty 2, 28d supply, fill #3
  Filled 2023-02-12: qty 2, 28d supply, fill #4
  Filled 2023-03-18: qty 2, 28d supply, fill #5

## 2022-04-06 ENCOUNTER — Other Ambulatory Visit (HOSPITAL_COMMUNITY): Payer: Self-pay

## 2022-04-27 ENCOUNTER — Other Ambulatory Visit (HOSPITAL_COMMUNITY): Payer: Self-pay

## 2022-05-06 ENCOUNTER — Other Ambulatory Visit (HOSPITAL_COMMUNITY): Payer: Self-pay

## 2022-05-12 ENCOUNTER — Other Ambulatory Visit (HOSPITAL_COMMUNITY): Payer: Self-pay

## 2022-06-15 DIAGNOSIS — E890 Postprocedural hypothyroidism: Secondary | ICD-10-CM | POA: Diagnosis not present

## 2022-06-17 ENCOUNTER — Other Ambulatory Visit (HOSPITAL_COMMUNITY): Payer: Self-pay

## 2022-06-23 ENCOUNTER — Other Ambulatory Visit (HOSPITAL_COMMUNITY): Payer: Self-pay

## 2022-06-25 ENCOUNTER — Other Ambulatory Visit (HOSPITAL_COMMUNITY): Payer: Self-pay

## 2022-07-27 ENCOUNTER — Other Ambulatory Visit (HOSPITAL_COMMUNITY): Payer: Self-pay

## 2022-08-21 ENCOUNTER — Other Ambulatory Visit (HOSPITAL_COMMUNITY): Payer: Self-pay

## 2022-08-24 ENCOUNTER — Other Ambulatory Visit (HOSPITAL_COMMUNITY): Payer: Self-pay

## 2022-08-26 ENCOUNTER — Other Ambulatory Visit (HOSPITAL_COMMUNITY): Payer: Self-pay

## 2022-09-23 ENCOUNTER — Other Ambulatory Visit (HOSPITAL_COMMUNITY): Payer: Self-pay

## 2022-10-02 DIAGNOSIS — E1165 Type 2 diabetes mellitus with hyperglycemia: Secondary | ICD-10-CM | POA: Diagnosis not present

## 2022-10-02 DIAGNOSIS — C73 Malignant neoplasm of thyroid gland: Secondary | ICD-10-CM | POA: Diagnosis not present

## 2022-10-02 DIAGNOSIS — E890 Postprocedural hypothyroidism: Secondary | ICD-10-CM | POA: Diagnosis not present

## 2022-10-02 DIAGNOSIS — E7800 Pure hypercholesterolemia, unspecified: Secondary | ICD-10-CM | POA: Diagnosis not present

## 2022-10-02 DIAGNOSIS — E559 Vitamin D deficiency, unspecified: Secondary | ICD-10-CM | POA: Diagnosis not present

## 2022-10-09 ENCOUNTER — Other Ambulatory Visit (HOSPITAL_COMMUNITY): Payer: Self-pay

## 2022-10-09 DIAGNOSIS — E890 Postprocedural hypothyroidism: Secondary | ICD-10-CM | POA: Diagnosis not present

## 2022-10-09 DIAGNOSIS — C73 Malignant neoplasm of thyroid gland: Secondary | ICD-10-CM | POA: Diagnosis not present

## 2022-10-09 DIAGNOSIS — E559 Vitamin D deficiency, unspecified: Secondary | ICD-10-CM | POA: Diagnosis not present

## 2022-10-09 DIAGNOSIS — E7800 Pure hypercholesterolemia, unspecified: Secondary | ICD-10-CM | POA: Diagnosis not present

## 2022-10-09 DIAGNOSIS — I10 Essential (primary) hypertension: Secondary | ICD-10-CM | POA: Diagnosis not present

## 2022-10-09 DIAGNOSIS — E1165 Type 2 diabetes mellitus with hyperglycemia: Secondary | ICD-10-CM | POA: Diagnosis not present

## 2022-10-09 DIAGNOSIS — K5900 Constipation, unspecified: Secondary | ICD-10-CM | POA: Diagnosis not present

## 2022-10-09 MED ORDER — SYNTHROID 200 MCG PO TABS
200.0000 ug | ORAL_TABLET | Freq: Every day | ORAL | 5 refills | Status: DC
  Filled 2022-10-09: qty 30, 30d supply, fill #0
  Filled 2022-11-20: qty 30, 30d supply, fill #1
  Filled 2022-12-21: qty 30, 30d supply, fill #2

## 2022-10-26 ENCOUNTER — Other Ambulatory Visit (HOSPITAL_COMMUNITY): Payer: Self-pay

## 2022-10-28 ENCOUNTER — Other Ambulatory Visit (HOSPITAL_COMMUNITY): Payer: Self-pay

## 2022-12-21 ENCOUNTER — Other Ambulatory Visit (HOSPITAL_COMMUNITY): Payer: Self-pay

## 2022-12-22 ENCOUNTER — Other Ambulatory Visit: Payer: Self-pay

## 2023-01-18 ENCOUNTER — Other Ambulatory Visit (HOSPITAL_COMMUNITY): Payer: Self-pay

## 2023-01-18 ENCOUNTER — Other Ambulatory Visit: Payer: Self-pay

## 2023-01-18 MED ORDER — ATORVASTATIN CALCIUM 40 MG PO TABS
40.0000 mg | ORAL_TABLET | Freq: Every day | ORAL | 11 refills | Status: DC
  Filled 2023-01-18: qty 30, 30d supply, fill #0
  Filled 2023-02-12: qty 30, 30d supply, fill #1
  Filled 2023-03-18: qty 30, 30d supply, fill #2
  Filled 2023-04-19: qty 30, 30d supply, fill #3
  Filled 2023-05-15: qty 30, 30d supply, fill #4
  Filled 2023-06-16: qty 30, 30d supply, fill #5
  Filled 2023-07-14: qty 30, 30d supply, fill #6
  Filled 2023-08-11: qty 30, 30d supply, fill #7
  Filled 2023-09-08: qty 30, 30d supply, fill #8
  Filled 2023-10-08: qty 30, 30d supply, fill #9
  Filled 2023-11-05: qty 30, 30d supply, fill #10

## 2023-01-28 ENCOUNTER — Other Ambulatory Visit (HOSPITAL_COMMUNITY): Payer: Self-pay

## 2023-02-12 ENCOUNTER — Other Ambulatory Visit: Payer: Self-pay

## 2023-02-15 ENCOUNTER — Other Ambulatory Visit (HOSPITAL_COMMUNITY): Payer: Self-pay

## 2023-03-18 ENCOUNTER — Other Ambulatory Visit: Payer: Self-pay

## 2023-04-16 DIAGNOSIS — E7800 Pure hypercholesterolemia, unspecified: Secondary | ICD-10-CM | POA: Diagnosis not present

## 2023-04-16 DIAGNOSIS — E1165 Type 2 diabetes mellitus with hyperglycemia: Secondary | ICD-10-CM | POA: Diagnosis not present

## 2023-04-16 DIAGNOSIS — E559 Vitamin D deficiency, unspecified: Secondary | ICD-10-CM | POA: Diagnosis not present

## 2023-04-16 DIAGNOSIS — C73 Malignant neoplasm of thyroid gland: Secondary | ICD-10-CM | POA: Diagnosis not present

## 2023-04-19 ENCOUNTER — Other Ambulatory Visit: Payer: Self-pay

## 2023-04-19 ENCOUNTER — Other Ambulatory Visit (HOSPITAL_COMMUNITY): Payer: Self-pay

## 2023-04-19 MED ORDER — MOUNJARO 10 MG/0.5ML ~~LOC~~ SOAJ
10.0000 mg | SUBCUTANEOUS | 6 refills | Status: DC
  Filled 2023-04-19: qty 2, 28d supply, fill #0
  Filled 2023-05-15: qty 2, 28d supply, fill #1
  Filled 2023-06-16: qty 2, 28d supply, fill #2
  Filled 2023-07-14: qty 2, 28d supply, fill #3
  Filled 2023-08-11: qty 2, 28d supply, fill #4
  Filled 2023-09-08: qty 2, 28d supply, fill #5
  Filled 2023-10-08: qty 2, 28d supply, fill #6

## 2023-04-23 ENCOUNTER — Other Ambulatory Visit (HOSPITAL_COMMUNITY): Payer: Self-pay

## 2023-04-23 DIAGNOSIS — E1165 Type 2 diabetes mellitus with hyperglycemia: Secondary | ICD-10-CM | POA: Diagnosis not present

## 2023-04-23 DIAGNOSIS — E890 Postprocedural hypothyroidism: Secondary | ICD-10-CM | POA: Diagnosis not present

## 2023-04-23 DIAGNOSIS — I10 Essential (primary) hypertension: Secondary | ICD-10-CM | POA: Diagnosis not present

## 2023-04-23 DIAGNOSIS — C73 Malignant neoplasm of thyroid gland: Secondary | ICD-10-CM | POA: Diagnosis not present

## 2023-04-23 DIAGNOSIS — R635 Abnormal weight gain: Secondary | ICD-10-CM | POA: Diagnosis not present

## 2023-04-23 MED ORDER — AZITHROMYCIN 250 MG PO TABS
ORAL_TABLET | ORAL | 1 refills | Status: DC
  Filled 2023-04-23: qty 6, 5d supply, fill #0
  Filled 2023-05-15: qty 6, 5d supply, fill #1

## 2023-04-23 MED ORDER — ERGOCALCIFEROL 1.25 MG (50000 UT) PO CAPS
50000.0000 [IU] | ORAL_CAPSULE | ORAL | 5 refills | Status: DC
  Filled 2023-04-23: qty 4, 28d supply, fill #0
  Filled 2023-05-15: qty 4, 28d supply, fill #1
  Filled 2023-06-16: qty 4, 28d supply, fill #2
  Filled 2023-07-14: qty 4, 28d supply, fill #3
  Filled 2023-08-11: qty 4, 28d supply, fill #4
  Filled 2023-09-08: qty 4, 28d supply, fill #5

## 2023-05-17 ENCOUNTER — Other Ambulatory Visit: Payer: Self-pay

## 2023-05-26 ENCOUNTER — Other Ambulatory Visit (HOSPITAL_COMMUNITY): Payer: Self-pay

## 2023-05-31 ENCOUNTER — Ambulatory Visit: Payer: Commercial Managed Care - PPO | Attending: Internal Medicine | Admitting: Internal Medicine

## 2023-05-31 ENCOUNTER — Ambulatory Visit (INDEPENDENT_AMBULATORY_CARE_PROVIDER_SITE_OTHER): Payer: Commercial Managed Care - PPO

## 2023-05-31 ENCOUNTER — Encounter: Payer: Self-pay | Admitting: Internal Medicine

## 2023-05-31 VITALS — BP 138/80 | HR 100 | Ht 64.0 in | Wt 227.0 lb

## 2023-05-31 DIAGNOSIS — R002 Palpitations: Secondary | ICD-10-CM | POA: Diagnosis not present

## 2023-05-31 DIAGNOSIS — R0600 Dyspnea, unspecified: Secondary | ICD-10-CM

## 2023-05-31 NOTE — Progress Notes (Unsigned)
Enrolled patient for a 14 day Zio XT  monitor to be mailed to patients home  °

## 2023-05-31 NOTE — Patient Instructions (Signed)
Medication Instructions:  Your physician recommends that you continue on your current medications as directed. Please refer to the Current Medication list given to you today.  *If you need a refill on your cardiac medications before your next appointment, please call your pharmacy*   Lab Work: Your physician recommends that you have labs drawn today: BNP If you have labs (blood work) drawn today and your tests are completely normal, you will receive your results only by: MyChart Message (if you have MyChart) OR A paper copy in the mail If you have any lab test that is abnormal or we need to change your treatment, we will call you to review the results.   Testing/Procedures: Your physician has requested that you have an echocardiogram. Echocardiography is a painless test that uses sound waves to create images of your heart. It provides your doctor with information about the size and shape of your heart and how well your heart's chambers and valves are working. This procedure takes approximately one hour. There are no restrictions for this procedure. Please do NOT wear cologne, perfume, aftershave, or lotions (deodorant is allowed). Please arrive 15 minutes prior to your appointment time.  ZIO XT- Long Term Monitor Instructions  Your physician has requested you wear a ZIO patch monitor for 14 days.  This is a single patch monitor. [employer] supplies one patch monitor per enrollment. Additional stickers are not available. Please do not apply patch if you will be having a Nuclear Stress Test,  Echocardiogram, Cardiac CT, MRI, or Chest Xray during the period you would be wearing the  monitor. The patch cannot be worn during these tests. You cannot remove and re-apply the  ZIO XT patch monitor.  Your ZIO patch monitor will be mailed 3 day USPS to your address on file. It may take 3-5 days  to receive your monitor after you have been enrolled.  Once you have received your monitor, please review  the enclosed instructions. Your monitor  has already been registered assigning a specific monitor serial # to you.  Billing and Patient Assistance Program Information  We have supplied [employer] with any of your insurance information on file for billing purposes. [employer] offers a sliding scale Patient Assistance Program for patients that do not have  insurance, or whose insurance does not completely cover the cost of the ZIO monitor.  You must apply for the Patient Assistance Program to qualify for this discounted rate.  To apply, please call [employer] at [phone], select option 4, select option 2, ask to apply for  Patient Assistance Program. Meredeth Ide will ask your household income, and how many people  are in your household. They will quote your out-of-pocket cost based on that information.  [employer] will also be able to set up a 76-month, interest-free payment plan if needed.  Applying the monitor   Shave hair from upper left chest.  Hold abrader disc by orange tab. Rub abrader in 40 strokes over the upper left chest as  indicated in your monitor instructions.  Clean area with 4 enclosed alcohol pads. Let dry.  Apply patch as indicated in monitor instructions. Patch will be placed under collarbone on left  side of chest with arrow pointing upward.  Rub patch adhesive wings for 2 minutes. Remove white label marked "1". Remove the white  label marked "2". Rub patch adhesive wings for 2 additional minutes.  While looking in a mirror, press and release button in center of patch. A small green light will  flash  3-4 times. This will be your only indicator that the monitor has been turned on.  Do not shower for the first 24 hours. You may shower after the first 24 hours.  Press the button if you feel a symptom. You will hear a small click. Record Date, Time and  Symptom in the Patient Logbook.  When you are ready to remove the patch, follow instructions on the last 2 pages of Patient   Logbook. Stick patch monitor onto the last page of Patient Logbook.  Place Patient Logbook in the blue and white box. Use locking tab on box and tape box closed  securely. The blue and white box has prepaid postage on it. Please place it in the mailbox as  soon as possible. Your physician should have your test results approximately 7 days after the  monitor has been mailed back to [facility].  Call [facility] Customer Care at [phone] if you have questions regarding  your ZIO XT patch monitor. Call them immediately if you see an orange light blinking on your  monitor.  If your monitor falls off in less than 4 days, contact our Monitor department at [phone].  If your monitor becomes loose or falls off after 4 days call [employer] at [phone] for  suggestions on securing your monitor    Follow-Up: At [facility], you and your health needs are our priority.  As part of our continuing mission to provide you with exceptional heart care, we have created designated Provider Care Teams.  These Care Teams include your primary Cardiologist (physician) and Advanced Practice Providers (APPs -  Physician Assistants and Nurse Practitioners) who all work together to provide you with the care you need, when you need it.   Your next appointment:    As needed   Provider:   Carolan Clines, MD

## 2023-05-31 NOTE — Progress Notes (Signed)
Cardiology Office Note:    Date:  05/31/2023   ID:  Maria Simmons, DOB 10-26-1966, MRN [id number]  PCP:  Doristine Bosworth, MD (Inactive)   [facility] Providers Cardiologist:  Maisie Fus, MD     Referring MD: Dorisann Frames, MD   No chief complaint on file. DOE  History of Present Illness:    Maria Simmons is a 57 y.o. female with a hx of obesity, hx of papillary thyroid CA  s/p thyroidectomy, hypothyroidism, hx of diffuse large B cell lymphoma, TIA 05/2021, referral "tachycardia"  She reports that she does a lot of yard work. She said her daughter noted that it seemed that her heart was racing. She can go up the stairs and feel very SOB. This has progressed since Feb. She denies CP.  No PND.  She notes BL leg edema throughout the day.   Prior Chemo [facility]; total doxorubicin 140 mg/m2  Hx of radiation 02/05/15-02/27/15 tonsil/neck,lt subpectoral nodal bed 30.6Gy /17 fx   Recent echo 06/08/2021 demonstrates EF 60-65%, nl RV, no valve dx, negative bubble Pre chemo TTE 10/2014- normal LVEF, normal study  Past Medical History:  Diagnosis Date   Arthritis of shoulder region, right, degenerative    right shoulder    Bladder prolapse, female, acquired    Cancer (HCC) 2007   thyroid, papillary   Change in bowel habits 03/19/2016   Constipation    DLBCL (diffuse large B cell lymphoma) (HCC)    History of B-cell lymphoma 11/18/2015   Hypothyroidism    Neutropenic fever (HCC) 11/01/2014   PONV (postoperative nausea and vomiting)    during hysterectomy    S/P radiation therapy 02/05/15-02/27/15   tonsil/neck,lt subpectoral nodal bed 30.6Gy /17 fx   Thyroid cancer (HCC)    Wears glasses     Past Surgical History:  Procedure Laterality Date   ABDOMINAL HYSTERECTOMY  2000   COLONOSCOPY     COLONOSCOPY WITH PROPOFOL N/A 06/19/2016   Procedure: COLONOSCOPY WITH PROPOFOL;  Surgeon: Bernette Redbird, MD;  Location: [facility] ENDOSCOPY;  Service: Endoscopy;  Laterality: N/A;    CYSTOCELE REPAIR N/A 05/03/2018   Procedure: ANTERIOR REPAIR (CYSTOCELE);  Surgeon: Alfredo Martinez, MD;  Location: [facility] ORS;  Service: Urology;  Laterality: N/A;   CYSTOSCOPY N/A 05/03/2018   Procedure: CYSTOSCOPY;  Surgeon: Alfredo Martinez, MD;  Location: [facility];  Service: Urology;  Laterality: N/A;   ESOPHAGOGASTRODUODENOSCOPY (EGD) WITH PROPOFOL N/A 06/19/2016   Procedure: ESOPHAGOGASTRODUODENOSCOPY (EGD) WITH PROPOFOL;  Surgeon: Bernette Redbird, MD;  [facility] END[facility]COPY;  Service: Endoscopy;  Laterality: N/A;   LAPAROSCOPIC GASTRIC BANDING WITH HIATAL HERNIA REPAIR  05/28/2009   LYMPH NODE BIOPSY Left 10/12/2014   Procedure: excisional biopsy deep left neck lymph node;  Surgeon: Claud Kelp, MD;  Location: [facility];  Service: General;  Laterality: Left;   PORT-A-CATH REMOVAL N/A 10/18/2015   Procedure: REMOVAL PORT-A-CATH;  Surgeon: Claud Kelp, MD;  Location: [facility];  Service: General;  Laterality: N/A;   PORTACATH PLACEMENT Right 10/24/2014   Procedure: INSERTION PORT-A-CATH WITH ULTRASOUND GUIDANCE;  Surgeon: Claud Kelp, MD;  Location: [facility];  Service: General;  Laterality: Right;   THYROIDECTOMY  2007   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   TOTAL HIP ARTHROPLASTY Left 11/12/2017   Procedure: LEFT TOTAL HIP ARTHROPLASTY ANTERIOR APPROACH;  Surgeon: Jodi Geralds, MD;  Location: [facility];  Service: Orthopedics;  Laterality: Left;   TOTAL HIP ARTHROPLASTY Right 09/15/2019   Procedure: TOTAL  HIP ARTHROPLASTY ANTERIOR APPROACH;  Surgeon: Jodi Geralds, MD;  Location: [facility];  Service: Orthopedics;  Laterality: Right;   TUBAL LIGATION  1990   VAGINAL PROLAPSE REPAIR N/A 05/03/2018   Procedure: VAULT PROLAPSE REPAIR WITH GRAFT;  Surgeon: Alfredo Martinez, MD;  Location: [facility];  Service: Urology;  Laterality: N/A;    Current Medications: Current Outpatient Medications on File Prior to Visit  Medication Sig  Dispense Refill   aspirin 81 MG EC tablet Take 1 tablet (81 mg total) by mouth daily with breakfast. Swallow whole. 30 tablet 11   atorvastatin (LIPITOR) 40 MG tablet Take 1 tablet (40 mg total) by mouth daily. 30 tablet 11   ergocalciferol (VITAMIN D2) 1.25 MG (50000 UT) capsule Take 1 capsule (50,000 Units total) by mouth once a week. 4 capsule 5   levothyroxine (SYNTHROID) 150 MCG tablet Take 150 mcg by mouth daily before breakfast.     polyethylene glycol (MIRALAX / GLYCOLAX) 17 g packet Take 17 g by mouth daily.     tirzepatide (MOUNJARO) 10 MG/0.5ML Pen Inject 10 mg into the skin once a week. 2 mL 6   Vitamin D, Ergocalciferol, (DRISDOL) 1.25 MG (50000 UNIT) CAPS capsule Take 1 capsule by mouth once a week. 12 capsule 4   azithromycin (ZITHROMAX) 250 MG tablet Take 2 tablets on day 1, then 1 tablet daily days 2 through 5 6 tablet 1   levothyroxine (SYNTHROID) 125 MCG tablet Take 2 tablets (250 mcg total) by mouth daily. (Patient not taking: Reported on 05/31/2023) 60 tablet 6   SYNTHROID 200 MCG tablet Take 1 tablet (200 mcg total) by mouth daily. (Patient not taking: Reported on 05/31/2023) 30 tablet 5   No current facility-administered medications on file prior to visit.    Allergies:   Metformin and related, Tape, Levaquin [levofloxacin], and Penicillins   Social History   Socioeconomic History   Marital status: Single    Spouse name: Not on file   Number of children: Not on file   Years of education: Not on file   Highest education level: Not on file  Occupational History   Not on file  Tobacco Use   Smoking status: Never   Smokeless tobacco: Never  Vaping Use   Vaping Use: Never used  Substance and Sexual Activity   Alcohol use: No   Drug use: No   Sexual activity: Yes    Birth control/protection: Surgical  Other Topics Concern   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 HAS 2 KIDS-1 GRAND SON AND A GOD GRANDSON.   Right  handed   Social Determinants of Health   Financial Resource Strain: Not on file  Food Insecurity: Not on file  Transportation Needs: Not on file  Physical Activity: Not on file  Stress: Not on file  Social Connections: Not on file     Family History: The patient's family history includes Alzheimer's disease in her paternal grandfather; Cancer in her father, mother, and paternal grandmother; Cerebral aneurysm in her maternal grandmother; Colon cancer in her mother; Heart attack in her maternal grandmother; Heart disease in her maternal grandmother; Hyperlipidemia in her father; Hypertension in her father; Migraines in her brother; Prostate cancer in her father; Stroke in her father. Grandmother- heart dx Father- CHF   ROS:   Please see the history of present illness.     All other systems reviewed and are negative.  EKGs/Labs/Other Studies Reviewed:    The  following studies were reviewed today:   EKG:  EKG is  ordered today.  The ekg ordered today demonstrates   05/31/2023- NSR  Recent Labs: No results found for requested labs within last 365 days.   Recent Lipid Panel    Component Value Date/Time   CHOL 139 09/16/2021 0949   TRIG 70.0 09/16/2021 0949   HDL 59.90 09/16/2021 0949   CHOLHDL 2 09/16/2021 0949   VLDL 14.0 09/16/2021 0949   LDLCALC 65 09/16/2021 0949     Risk Assessment/Calculations:     Physical Exam:    VS:  Vitals:   05/31/23 1023  BP: 138/80  Pulse: 100  SpO2: 97%     BP 138/80 (BP Location: Right Arm, Patient Position: Sitting, Cuff Size: Large)   Pulse 100   Ht 5\' 4"  (1.626 m)   Wt 227 lb (103 kg)   LMP 04/13/2000 (Exact Date)   SpO2 97%   BMI 38.96 kg/m     Wt Readings from Last 3 Encounters:  05/31/23 227 lb (103 kg)  03/26/22 206 lb 9.6 oz (93.7 kg)  01/05/22 205 lb 6.4 oz (93.2 kg)     GEN:  Well nourished, well developed in no acute distress HEENT: Normal NECK: No JVD; No carotid bruits CARDIAC: RRR, no murmurs, rubs,  gallops RESPIRATORY:  Clear to auscultation without rales, wheezing or rhonchi  ABDOMEN: Soft, non-tender, non-distended MUSCULOSKELETAL:  No edema; No deformity  SKIN: Warm and dry NEUROLOGIC:  Alert and oriented x 3 PSYCHIATRIC:  Normal affect   ASSESSMENT:    DOE: no signs of CHF on exam.  Low risk for coronary dx. No ischemic changes on her EKG. O2 sat is normal and no sig tachycardia, lower suspicion of PE. Will get TTE initially. If unremarkable and symptoms progress will consider coronary CTA LE edema: c/f dependant edema Palpitations: normal rhythm today PLAN:    In order of problems listed above:  Recommend compression stockings TTE BNP 2 week ziopatch Follow up pending results       Medication Adjustments/Labs and Tests Ordered: Current medicines are reviewed at length with the patient today.  Concerns regarding medicines are outlined above.  No orders of the defined types were placed in this encounter.  No orders of the defined types were placed in this encounter.   Patient Instructions  Medication Instructions:  Your physician recommends that you continue on your current medications as directed. Please refer to the Current Medication list given to you today.  *If you need a refill on your cardiac medications before your next appointment, please call your pharmacy*   Lab Work: Your physician recommends that you have labs drawn today: BNP If you have labs (blood work) drawn today and your tests are completely normal, you will receive your results only by: MyChart Message (if you have MyChart) OR A paper copy in the mail If you have any lab test that is abnormal or we need to change your treatment, we will call you to review the results.   Testing/Procedures: Your physician has requested that you have an echocardiogram. Echocardiography is a painless test that uses sound waves to create images of your heart. It provides your doctor with information about the  size and shape of your heart and how well your heart's chambers and valves are working. This procedure takes approximately one hour. There are no restrictions for this procedure. Please do NOT wear cologne, perfume, aftershave, or lotions (deodorant is allowed). Please arrive 15 minutes prior to  your appointment time.  ZIO XT- Long Term Monitor Instructions  Your physician has requested you wear a ZIO patch monitor for 14 days.  This is a single patch monitor. [employer] supplies one patch monitor per enrollment. Additional stickers are not available. Please do not apply patch if you will be having a Nuclear Stress Test,  Echocardiogram, Cardiac CT, MRI, or Chest Xray during the period you would be wearing the  monitor. The patch cannot be worn during these tests. You cannot remove and re-apply the  ZIO XT patch monitor.  Your ZIO patch monitor will be mailed 3 day USPS to your address on file. It may take 3-5 days  to receive your monitor after you have been enrolled.  Once you have received your monitor, please review the enclosed instructions. Your monitor  has already been registered assigning a specific monitor serial # to you.  Billing and Patient Assistance Program Information  We have supplied [employer] with any of your insurance information on file for billing purposes. [employer] offers a sliding scale Patient Assistance Program for patients that do not have  insurance, or whose insurance does not completely cover the cost of the ZIO monitor.  You must apply for the Patient Assistance Program to qualify for this discounted rate.  To apply, please call [employer] at [phone], select option 4, select option 2, ask to apply for  Patient Assistance Program. Meredeth Ide will ask your household income, and how many people  are in your household. They will quote your out-of-pocket cost based on that information.  [employer] will also be able to set up a 78-month, interest-free payment plan if  needed.  Applying the monitor   Shave hair from upper left chest.  Hold abrader disc by orange tab. Rub abrader in 40 strokes over the upper left chest as  indicated in your monitor instructions.  Clean area with 4 enclosed alcohol pads. Let dry.  Apply patch as indicated in monitor instructions. Patch will be placed under collarbone on left  side of chest with arrow pointing upward.  Rub patch adhesive wings for 2 minutes. Remove white label marked "1". Remove the white  label marked "2". Rub patch adhesive wings for 2 additional minutes.  While looking in a mirror, press and release button in center of patch. A small green light will  flash 3-4 times. This will be your only indicator that the monitor has been turned on.  Do not shower for the first 24 hours. You may shower after the first 24 hours.  Press the button if you feel a symptom. You will hear a small click. Record Date, Time and  Symptom in the Patient Logbook.  When you are ready to remove the patch, follow instructions on the last 2 pages of Patient  Logbook. Stick patch monitor onto the last page of Patient Logbook.  Place Patient Logbook in the blue and white box. Use locking tab on box and tape box closed  securely. The blue and white box has prepaid postage on it. Please place it in the mailbox as  soon as possible. Your physician should have your test results approximately 7 days after the  monitor has been mailed back to [facility].  Call [facility] Customer Care at [phone] if you have questions regarding  your ZIO XT patch monitor. Call them immediately if you see an orange light blinking on your  monitor.  If your monitor falls off in less than 4 days, contact our Monitor department at  [phone].  If your monitor becomes loose or falls off after 4 days call [employer] at [phone] for  suggestions on securing your monitor    Follow-Up: At [facility], you and your health needs are  our priority.  As part of our continuing mission to provide you with exceptional heart care, we have created designated Provider Care Teams.  These Care Teams include your primary Cardiologist (physician) and Advanced Practice Providers (APPs -  Physician Assistants and Nurse Practitioners) who all work together to provide you with the care you need, when you need it.   Your next appointment:    As needed   Provider:   Carolan Clines, MD     Signed, Maisie Fus, MD  05/31/2023 10:42 AM    [facility]

## 2023-06-02 LAB — PRO B NATRIURETIC PEPTIDE: NT-Pro BNP: 43 pg/mL (ref 0–287)

## 2023-06-03 DIAGNOSIS — R002 Palpitations: Secondary | ICD-10-CM

## 2023-06-22 ENCOUNTER — Ambulatory Visit (HOSPITAL_COMMUNITY): Payer: Commercial Managed Care - PPO | Attending: Internal Medicine

## 2023-06-22 DIAGNOSIS — R0600 Dyspnea, unspecified: Secondary | ICD-10-CM | POA: Insufficient documentation

## 2023-06-22 DIAGNOSIS — I3481 Nonrheumatic mitral (valve) annulus calcification: Secondary | ICD-10-CM

## 2023-06-22 DIAGNOSIS — I5030 Unspecified diastolic (congestive) heart failure: Secondary | ICD-10-CM

## 2023-06-22 DIAGNOSIS — R002 Palpitations: Secondary | ICD-10-CM | POA: Diagnosis not present

## 2023-06-22 LAB — ECHOCARDIOGRAM COMPLETE
Area-P 1/2: 4.07 cm2
S' Lateral: 2.7 cm

## 2023-06-23 ENCOUNTER — Encounter: Payer: Self-pay | Admitting: Internal Medicine

## 2023-06-25 ENCOUNTER — Other Ambulatory Visit: Payer: Self-pay | Admitting: Obstetrics and Gynecology

## 2023-06-25 DIAGNOSIS — Z1231 Encounter for screening mammogram for malignant neoplasm of breast: Secondary | ICD-10-CM

## 2023-07-07 ENCOUNTER — Ambulatory Visit
Admission: RE | Admit: 2023-07-07 | Discharge: 2023-07-07 | Disposition: A | Discharge status: Home / Self Care | Payer: Commercial Managed Care - PPO | Source: Ambulatory Visit | Attending: Obstetrics and Gynecology | Admitting: Obstetrics and Gynecology

## 2023-07-07 DIAGNOSIS — Z1231 Encounter for screening mammogram for malignant neoplasm of breast: Secondary | ICD-10-CM | POA: Diagnosis not present

## 2023-07-07 DIAGNOSIS — E890 Postprocedural hypothyroidism: Secondary | ICD-10-CM | POA: Diagnosis not present

## 2023-07-14 ENCOUNTER — Other Ambulatory Visit (HOSPITAL_COMMUNITY): Payer: Self-pay

## 2023-08-10 DIAGNOSIS — Z01419 Encounter for gynecological examination (general) (routine) without abnormal findings: Secondary | ICD-10-CM | POA: Diagnosis not present

## 2023-08-10 DIAGNOSIS — E039 Hypothyroidism, unspecified: Secondary | ICD-10-CM | POA: Diagnosis not present

## 2023-08-10 DIAGNOSIS — Z90710 Acquired absence of both cervix and uterus: Secondary | ICD-10-CM | POA: Diagnosis not present

## 2023-08-10 DIAGNOSIS — N8111 Cystocele, midline: Secondary | ICD-10-CM | POA: Diagnosis not present

## 2023-08-10 DIAGNOSIS — Z85850 Personal history of malignant neoplasm of thyroid: Secondary | ICD-10-CM | POA: Diagnosis not present

## 2023-08-12 ENCOUNTER — Other Ambulatory Visit (HOSPITAL_COMMUNITY): Payer: Self-pay

## 2023-09-01 DIAGNOSIS — Z800 Family history of malignant neoplasm of digestive organs: Secondary | ICD-10-CM | POA: Diagnosis not present

## 2023-09-01 DIAGNOSIS — Z1211 Encounter for screening for malignant neoplasm of colon: Secondary | ICD-10-CM | POA: Diagnosis not present

## 2023-09-02 ENCOUNTER — Other Ambulatory Visit (HOSPITAL_COMMUNITY): Payer: Self-pay

## 2023-09-02 MED ORDER — PEG 3350-KCL-NA BICARB-NACL 420 G PO SOLR
ORAL | 0 refills | Status: DC
Start: 2023-09-02 — End: 2023-11-17
  Filled 2023-09-02: qty 4000, 1d supply, fill #0

## 2023-09-02 MED ORDER — BISACODYL 5 MG PO TBEC
DELAYED_RELEASE_TABLET | ORAL | 0 refills | Status: DC
  Filled 2023-09-02: qty 4, 1d supply, fill #0

## 2023-09-09 DIAGNOSIS — E890 Postprocedural hypothyroidism: Secondary | ICD-10-CM | POA: Diagnosis not present

## 2023-09-10 ENCOUNTER — Other Ambulatory Visit (HOSPITAL_COMMUNITY): Payer: Self-pay

## 2023-09-10 MED ORDER — LEVOTHYROXINE SODIUM 137 MCG PO TABS
137.0000 ug | ORAL_TABLET | Freq: Every morning | ORAL | 5 refills | Status: DC
  Filled 2023-09-10: qty 30, 30d supply, fill #0
  Filled 2023-10-08: qty 30, 30d supply, fill #1
  Filled 2024-01-31: qty 30, 30d supply, fill #2
  Filled 2024-02-26: qty 30, 30d supply, fill #3
  Filled 2024-03-27: qty 30, 30d supply, fill #4
  Filled 2024-05-10: qty 30, 30d supply, fill #5
  Filled ????-??-??: fill #2

## 2023-10-04 ENCOUNTER — Other Ambulatory Visit (HOSPITAL_COMMUNITY): Payer: Self-pay

## 2023-10-08 ENCOUNTER — Other Ambulatory Visit (HOSPITAL_COMMUNITY): Payer: Self-pay

## 2023-10-08 MED ORDER — VITAMIN D (ERGOCALCIFEROL) 1.25 MG (50000 UNIT) PO CAPS
50000.0000 [IU] | ORAL_CAPSULE | ORAL | 5 refills | Status: DC
  Filled 2023-10-08: qty 4, 28d supply, fill #0
  Filled 2024-01-31: qty 4, 28d supply, fill #1

## 2023-10-15 DIAGNOSIS — K648 Other hemorrhoids: Secondary | ICD-10-CM | POA: Diagnosis not present

## 2023-10-15 DIAGNOSIS — D125 Benign neoplasm of sigmoid colon: Secondary | ICD-10-CM | POA: Diagnosis not present

## 2023-10-15 DIAGNOSIS — Z800 Family history of malignant neoplasm of digestive organs: Secondary | ICD-10-CM | POA: Diagnosis not present

## 2023-10-15 DIAGNOSIS — K635 Polyp of colon: Secondary | ICD-10-CM | POA: Diagnosis not present

## 2023-10-15 DIAGNOSIS — Z1211 Encounter for screening for malignant neoplasm of colon: Secondary | ICD-10-CM | POA: Diagnosis not present

## 2023-10-25 ENCOUNTER — Other Ambulatory Visit (HOSPITAL_COMMUNITY): Payer: Self-pay

## 2023-10-25 ENCOUNTER — Other Ambulatory Visit: Payer: Self-pay | Admitting: Endocrinology

## 2023-10-25 ENCOUNTER — Encounter: Payer: Self-pay | Admitting: Endocrinology

## 2023-10-25 DIAGNOSIS — C73 Malignant neoplasm of thyroid gland: Secondary | ICD-10-CM | POA: Diagnosis not present

## 2023-10-25 DIAGNOSIS — I10 Essential (primary) hypertension: Secondary | ICD-10-CM | POA: Diagnosis not present

## 2023-10-25 DIAGNOSIS — E559 Vitamin D deficiency, unspecified: Secondary | ICD-10-CM | POA: Diagnosis not present

## 2023-10-25 DIAGNOSIS — E890 Postprocedural hypothyroidism: Secondary | ICD-10-CM | POA: Diagnosis not present

## 2023-10-25 DIAGNOSIS — E1165 Type 2 diabetes mellitus with hyperglycemia: Secondary | ICD-10-CM | POA: Diagnosis not present

## 2023-10-25 DIAGNOSIS — E8940 Asymptomatic postprocedural ovarian failure: Secondary | ICD-10-CM

## 2023-10-25 MED ORDER — MOUNJARO 12.5 MG/0.5ML ~~LOC~~ SOAJ
12.5000 mg | SUBCUTANEOUS | 6 refills | Status: DC
  Filled 2023-10-25 – 2023-11-08 (×2): qty 2, 28d supply, fill #0
  Filled 2023-12-04: qty 2, 28d supply, fill #1
  Filled 2024-01-05: qty 2, 28d supply, fill #2
  Filled 2024-01-29: qty 2, 28d supply, fill #3
  Filled 2024-02-26: qty 2, 28d supply, fill #4
  Filled 2024-03-27: qty 2, 28d supply, fill #5
  Filled 2024-05-10: qty 2, 28d supply, fill #6

## 2023-10-25 MED ORDER — VITAMIN D (ERGOCALCIFEROL) 1.25 MG (50000 UNIT) PO CAPS
50000.0000 [IU] | ORAL_CAPSULE | ORAL | 5 refills | Status: DC
  Filled 2023-10-25 – 2023-12-04 (×2): qty 12, 84d supply, fill #0
  Filled 2024-01-29 – 2024-02-26 (×2): qty 12, 84d supply, fill #1
  Filled 2024-03-27 – 2024-05-10 (×2): qty 12, 84d supply, fill #2
  Filled 2024-06-12 – 2024-08-10 (×3): qty 12, 84d supply, fill #3
  Filled 2024-09-09 – 2024-10-18 (×3): qty 12, 84d supply, fill #4

## 2023-11-05 ENCOUNTER — Other Ambulatory Visit: Payer: Self-pay

## 2023-11-08 ENCOUNTER — Other Ambulatory Visit (HOSPITAL_COMMUNITY): Payer: Self-pay

## 2023-11-17 ENCOUNTER — Encounter: Payer: Self-pay | Admitting: Nurse Practitioner

## 2023-11-17 ENCOUNTER — Ambulatory Visit: Payer: Commercial Managed Care - PPO | Admitting: Nurse Practitioner

## 2023-11-17 ENCOUNTER — Other Ambulatory Visit (HOSPITAL_COMMUNITY): Payer: Self-pay

## 2023-11-17 VITALS — BP 110/70 | HR 83 | Temp 98.0°F | Ht 64.0 in | Wt 207.2 lb

## 2023-11-17 DIAGNOSIS — G620 Drug-induced polyneuropathy: Secondary | ICD-10-CM

## 2023-11-17 DIAGNOSIS — Z23 Encounter for immunization: Secondary | ICD-10-CM | POA: Diagnosis not present

## 2023-11-17 DIAGNOSIS — Z2821 Immunization not carried out because of patient refusal: Secondary | ICD-10-CM

## 2023-11-17 DIAGNOSIS — E039 Hypothyroidism, unspecified: Secondary | ICD-10-CM

## 2023-11-17 DIAGNOSIS — D708 Other neutropenia: Secondary | ICD-10-CM | POA: Diagnosis not present

## 2023-11-17 DIAGNOSIS — C8331 Diffuse large B-cell lymphoma, lymph nodes of head, face, and neck: Secondary | ICD-10-CM

## 2023-11-17 DIAGNOSIS — N8110 Cystocele, unspecified: Secondary | ICD-10-CM | POA: Diagnosis not present

## 2023-11-17 DIAGNOSIS — G459 Transient cerebral ischemic attack, unspecified: Secondary | ICD-10-CM

## 2023-11-17 DIAGNOSIS — Z85850 Personal history of malignant neoplasm of thyroid: Secondary | ICD-10-CM

## 2023-11-17 DIAGNOSIS — D509 Iron deficiency anemia, unspecified: Secondary | ICD-10-CM

## 2023-11-17 DIAGNOSIS — Z8673 Personal history of transient ischemic attack (TIA), and cerebral infarction without residual deficits: Secondary | ICD-10-CM

## 2023-11-17 DIAGNOSIS — T451X5A Adverse effect of antineoplastic and immunosuppressive drugs, initial encounter: Secondary | ICD-10-CM | POA: Diagnosis not present

## 2023-11-17 DIAGNOSIS — D519 Vitamin B12 deficiency anemia, unspecified: Secondary | ICD-10-CM | POA: Diagnosis not present

## 2023-11-17 DIAGNOSIS — N393 Stress incontinence (female) (male): Secondary | ICD-10-CM

## 2023-11-17 DIAGNOSIS — Z7689 Persons encountering health services in other specified circumstances: Secondary | ICD-10-CM

## 2023-11-17 MED ORDER — ATORVASTATIN CALCIUM 40 MG PO TABS
40.0000 mg | ORAL_TABLET | Freq: Every day | ORAL | 1 refills | Status: DC
  Filled 2023-11-17 – 2023-12-04 (×2): qty 90, 90d supply, fill #0
  Filled 2024-01-29 – 2024-03-27 (×2): qty 90, 90d supply, fill #1

## 2023-11-17 NOTE — Progress Notes (Signed)
Madelaine Bhat, CMA,acting as a Neurosurgeon for Maria Felts, FNP.,have documented all relevant documentation on the behalf of Maria Felts, FNP,as directed by  Maria Felts, FNP while in the presence of Maria Felts, FNP.  Subjective:  Patient ID: Maria Simmons , female    DOB: 10-18-1966 , 57 y.o.   MRN: [id number]  Chief Complaint  Patient presents with   Establish Care    HPI  Patient presents today to establish care. She was a patient of Dr. Collie Siad up until last year.  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 Divorced, single. She is dating and has a partner.  Patient reports compliance with medication. Patient denies any chest pain or headaches. Patient reports she is still having a little swelling in her feet and legs but not much. Patient reports in June she was having SOB and palpation she did see her cardiologist for this. Patient states the SOB and palpations are better now. Since she has lost some weight and she feels this may have helped with her breathing  Patient reports she had her bladder replaced and she sometimes leaks urine, she wears a pad daily.    Patient reports she recently (less than 2 month) she became sexually active after 15 years and she reports she now has bleeding every time after sex-she reports pain in left ovary but she has told her GYN Dr.Cousin. She reports she only felt pain 2 times when having intercourse.   She had a TIA in 2022 and seen Neurology and released. She had vitamin b12 deficiency but did not do injections. She is no longer taking vitamin b12 oral supplements due her thyroid levels being abnormal.      Past Medical History:  Diagnosis Date   Arthritis of shoulder region, right, degenerative    right shoulder    Bladder prolapse, female, acquired    Cancer (HCC) 2007   thyroid, papillary   Change in bowel habits 03/19/2016   Constipation    Diabetes mellitus without complication (HCC)    DLBCL (diffuse  large B cell lymphoma) (HCC)    History of B-cell lymphoma 11/18/2015   Hypothyroidism    Neutropenic fever (HCC) 11/01/2014   PONV (postoperative nausea and vomiting)    during hysterectomy    S/P radiation therapy 02/05/15-02/27/15   tonsil/neck,lt subpectoral nodal bed 30.6Gy /17 fx   Thyroid cancer (HCC)    Wears glasses      Family History  Problem Relation Age of Onset   Colon cancer Mother    Cancer Mother        colon ca   Early death Mother    Cancer Father        prostate   Hypertension Father    Hyperlipidemia Father    Stroke Father    Prostate cancer Father    Diabetes Father    Heart disease Father    Migraines Brother    Cerebral aneurysm Maternal Grandmother    Heart disease Maternal Grandmother    Heart attack Maternal Grandmother    Cancer Paternal Grandmother        female organ ca   Alzheimer's disease Paternal Grandfather      Current Outpatient Medications:    aspirin 81 MG EC tablet, Take 1 tablet (81 mg total) by mouth daily with breakfast. Swallow whole., Disp: 30 tablet, Rfl: 11   levothyroxine (SYNTHROID) 137 MCG tablet, Take 1 tablet (137 mcg total) by mouth in the morning on  an empty stomach., Disp: 30 tablet, Rfl: 5   polyethylene glycol (MIRALAX / GLYCOLAX) 17 g packet, Take 17 g by mouth daily., Disp: , Rfl:    tirzepatide (MOUNJARO) 12.5 MG/0.5ML Pen, Inject 12.5 mg into the skin once a week., Disp: 2 mL, Rfl: 6   Vitamin D, Ergocalciferol, (DRISDOL) 1.25 MG (50000 UNIT) CAPS capsule, Take 1 capsule (50,000 Units total) by mouth once a week., Disp: 12 capsule, Rfl: 5   atorvastatin (LIPITOR) 40 MG tablet, Take 1 tablet (40 mg total) by mouth daily., Disp: 90 tablet, Rfl: 1   Allergies  Allergen Reactions   Metformin And Related Rash    Fingers turned  Blue.   Tape Itching    Surgical tape.   Levaquin [Levofloxacin] Rash   Penicillins Rash    Has patient had a PCN reaction causing immediate rash, facial/tongue/throat swelling, SOB or  lightheadedness with hypotension: No Has patient had a PCN reaction causing severe rash involving mucus membranes or skin necrosis: No Has patient had a PCN reaction that required hospitalization No Has patient had a PCN reaction occurring within the last 10 years: No If all of the above answers are "NO", then may proceed with Cephalosporin use.      Review of Systems  Constitutional: Negative.  Negative for activity change and fatigue.  Eyes:  Negative for visual disturbance.  Respiratory: Negative.  Negative for choking, shortness of breath and wheezing.   Cardiovascular: Negative.  Negative for chest pain, palpitations and leg swelling.  Gastrointestinal: Negative.   Endocrine: Negative.  Negative for polydipsia, polyphagia and polyuria.  Musculoskeletal: Negative.   Skin: Negative.   Neurological:  Negative for dizziness, weakness and headaches.  Psychiatric/Behavioral:  Negative for confusion. The patient is not nervous/anxious.      Today's Vitals   11/17/23 1446  BP: 110/70  Pulse: 83  Temp: 98 F (36.7 C)  TempSrc: Oral  Weight: 207 lb 3.2 oz (94 kg)  Height: 5\' 4"  (1.626 m)  PainSc: 0-No pain   Body mass index is 35.57 kg/m.  Wt Readings from Last 3 Encounters:  11/17/23 207 lb 3.2 oz (94 kg)  05/31/23 227 lb (103 kg)  03/26/22 206 lb 9.6 oz (93.7 kg)    The ASCVD Risk score (Arnett DK, et al., 2019) failed to calculate for the following reasons:   Risk score cannot be calculated because patient has a medical history suggesting prior/existing ASCVD  Objective:  Physical Exam Vitals reviewed.  Constitutional:      General: She is not in acute distress.    Appearance: Normal appearance.  Cardiovascular:     Rate and Rhythm: Normal rate and regular rhythm.     Pulses: Normal pulses.     Heart sounds: Normal heart sounds. No murmur heard. Pulmonary:     Effort: Pulmonary effort is normal.     Breath sounds: Normal breath sounds. No wheezing.  Skin:     Capillary Refill: Capillary refill takes less than 2 seconds.  Neurological:     General: No focal deficit present.     Mental Status: She is alert and oriented to person, place, and time.     Cranial Nerves: No cranial nerve deficit.     Motor: No weakness.  Psychiatric:        Mood and Affect: Mood normal.        Behavior: Behavior normal.        Thought Content: Thought content normal.  Judgment: Judgment normal.         Assessment And Plan:  Establishing care with new doctor, encounter for Assessment & Plan: Patient is here to establish care. Went over patient medical, family, social and surgical history. Reviewed with patient their medications and any allergies  Reviewed with patient their sexual orientation, drug/tobacco and alcohol use Dicussed any new concerns with patient  recommended patient comes in for a physical exam and complete blood work.  Educated patient about the importance of annual screenings and immunizations.  Advised patient to eat a healthy diet along with exercise for atleast 30-45 min atleast 4-5 days of the week.     History of TIA (transient ischemic attack) Assessment & Plan: Previous history of TIA   Hypothyroidism, unspecified type Assessment & Plan: Will check thyroid levels    Vaginal prolapse Assessment & Plan: Will refer to PT   Anemia due to vitamin B12 deficiency, unspecified B12 deficiency type Assessment & Plan: Will recheck vitamin B12   Orders: -     Vitamin B12  Iron deficiency anemia, unspecified iron deficiency anemia type Assessment & Plan: Will check vitamin B12.   Orders: -     Iron, TIBC and Ferritin Panel  Herpes zoster vaccination declined Assessment & Plan: Declines shingrix, educated on disease process and is aware if he changes his mind to notify office    Need for Tdap vaccination Assessment & Plan: Will give tetanus vaccine today while in office. Refer to order management. TDAP will be  administered to adults 32-59 years old every 10 years.   Orders: -     Tdap vaccine greater than or equal to 7yo IM  Hx of papillary thyroid carcinoma  Diffuse large B-cell lymphoma of lymph nodes of neck (HCC) Assessment & Plan: Continue f/u with Oncology   Other neutropenia (HCC)  Neuropathy due to chemotherapeutic drug (HCC)  Stress incontinence of urine Assessment & Plan: Will refer to PT for pelvic rehab  Orders: -     Ambulatory referral to Physical Therapy  Other orders -     Atorvastatin Calcium; Take 1 tablet (40 mg total) by mouth daily.  Dispense: 90 tablet; Refill: 1    Return in about 4 months (around 03/17/2024) for phy when able..  Patient was given opportunity to ask questions. Patient verbalized understanding of the plan and was able to repeat key elements of the plan. All questions were answered to their satisfaction.   Jeanell Sparrow, FNP, have reviewed all documentation for this visit. The documentation on 11/17/23 for the exam, diagnosis, procedures, and orders are all accurate and complete.    IF YOU HAVE BEEN REFERRED TO A SPECIALIST, IT MAY TAKE 1-2 WEEKS TO SCHEDULE/PROCESS THE REFERRAL. IF YOU HAVE NOT HEARD FROM US/SPECIALIST IN TWO WEEKS, PLEASE GIVE [place] A CALL AT [phone].

## 2023-11-18 LAB — IRON,TIBC AND FERRITIN PANEL
Ferritin: 61 ng/mL (ref 15–150)
Iron Saturation: 15 % (ref 15–55)
Iron: 51 ug/dL (ref 27–159)
Total Iron Binding Capacity: 341 ug/dL (ref 250–450)
UIBC: 290 ug/dL (ref 131–425)

## 2023-11-18 LAB — VITAMIN B12: Vitamin B-12: 410 pg/mL (ref 232–1245)

## 2023-11-25 DIAGNOSIS — Z23 Encounter for immunization: Secondary | ICD-10-CM | POA: Insufficient documentation

## 2023-11-25 DIAGNOSIS — Z7689 Persons encountering health services in other specified circumstances: Secondary | ICD-10-CM | POA: Insufficient documentation

## 2023-11-25 DIAGNOSIS — Z2821 Immunization not carried out because of patient refusal: Secondary | ICD-10-CM | POA: Insufficient documentation

## 2023-11-25 NOTE — Assessment & Plan Note (Signed)
Declines shingrix, educated on disease process and is aware if he changes his mind to notify office  

## 2023-11-25 NOTE — Assessment & Plan Note (Signed)
Will refer to PT

## 2023-11-25 NOTE — Assessment & Plan Note (Signed)
Will check thyroid levels

## 2023-11-25 NOTE — Assessment & Plan Note (Signed)
 Will recheck vitamin B12

## 2023-11-25 NOTE — Assessment & Plan Note (Signed)
Will refer to PT for pelvic rehab

## 2023-11-25 NOTE — Assessment & Plan Note (Signed)
Will check vitamin B12

## 2023-11-25 NOTE — Assessment & Plan Note (Signed)
Will give tetanus vaccine today while in office. Refer to order management. TDAP will be administered to adults 79-59 years old every 10 years.

## 2023-11-25 NOTE — Assessment & Plan Note (Signed)
Continue f/u with Oncology.

## 2023-11-25 NOTE — Assessment & Plan Note (Signed)
 Patient is here to establish care. Went over patient medical, family, social and surgical history. Reviewed with patient their medications and any allergies  Reviewed with patient their sexual orientation, drug/tobacco and alcohol use Dicussed any new concerns with patient  recommended patient comes in for a physical exam and complete blood work.  Educated patient about the importance of annual screenings and immunizations.  Advised patient to eat a healthy diet along with exercise for atleast 30-45 min atleast 4-5 days of the week.

## 2023-11-25 NOTE — Assessment & Plan Note (Signed)
 Previous history of TIA

## 2023-12-04 ENCOUNTER — Other Ambulatory Visit (HOSPITAL_COMMUNITY): Payer: Self-pay

## 2023-12-06 ENCOUNTER — Other Ambulatory Visit (HOSPITAL_COMMUNITY): Payer: Self-pay

## 2023-12-06 ENCOUNTER — Other Ambulatory Visit: Payer: Self-pay

## 2024-01-20 DIAGNOSIS — Z90710 Acquired absence of both cervix and uterus: Secondary | ICD-10-CM | POA: Diagnosis not present

## 2024-01-20 DIAGNOSIS — N950 Postmenopausal bleeding: Secondary | ICD-10-CM | POA: Diagnosis not present

## 2024-01-26 DIAGNOSIS — R1904 Left lower quadrant abdominal swelling, mass and lump: Secondary | ICD-10-CM | POA: Diagnosis not present

## 2024-01-26 DIAGNOSIS — R1032 Left lower quadrant pain: Secondary | ICD-10-CM | POA: Diagnosis not present

## 2024-01-26 DIAGNOSIS — Z90710 Acquired absence of both cervix and uterus: Secondary | ICD-10-CM | POA: Diagnosis not present

## 2024-01-26 DIAGNOSIS — Z85850 Personal history of malignant neoplasm of thyroid: Secondary | ICD-10-CM | POA: Diagnosis not present

## 2024-01-29 ENCOUNTER — Encounter (HOSPITAL_COMMUNITY): Payer: Self-pay | Admitting: Pharmacist

## 2024-01-29 ENCOUNTER — Other Ambulatory Visit (HOSPITAL_COMMUNITY): Payer: Self-pay

## 2024-01-31 ENCOUNTER — Other Ambulatory Visit (HOSPITAL_COMMUNITY): Payer: Self-pay

## 2024-01-31 ENCOUNTER — Other Ambulatory Visit: Payer: Self-pay

## 2024-02-24 ENCOUNTER — Other Ambulatory Visit: Payer: Self-pay | Admitting: Surgery

## 2024-02-24 DIAGNOSIS — K625 Hemorrhage of anus and rectum: Secondary | ICD-10-CM | POA: Diagnosis not present

## 2024-02-24 DIAGNOSIS — R1032 Left lower quadrant pain: Secondary | ICD-10-CM | POA: Diagnosis not present

## 2024-02-24 DIAGNOSIS — R1904 Left lower quadrant abdominal swelling, mass and lump: Secondary | ICD-10-CM | POA: Diagnosis not present

## 2024-02-24 DIAGNOSIS — N939 Abnormal uterine and vaginal bleeding, unspecified: Secondary | ICD-10-CM | POA: Diagnosis not present

## 2024-03-01 ENCOUNTER — Other Ambulatory Visit

## 2024-03-01 ENCOUNTER — Ambulatory Visit
Admission: RE | Admit: 2024-03-01 | Discharge: 2024-03-01 | Disposition: A | Discharge status: Home / Self Care | Source: Ambulatory Visit | Attending: Surgery | Admitting: Surgery

## 2024-03-01 DIAGNOSIS — R1032 Left lower quadrant pain: Secondary | ICD-10-CM

## 2024-03-01 DIAGNOSIS — K5730 Diverticulosis of large intestine without perforation or abscess without bleeding: Secondary | ICD-10-CM | POA: Diagnosis not present

## 2024-03-01 MED ORDER — IOPAMIDOL (ISOVUE-300) INJECTION 61%
100.0000 mL | Freq: Once | INTRAVENOUS | Status: AC | PRN
  Administered 2024-03-01: 100 mL via INTRAVENOUS

## 2024-03-14 DIAGNOSIS — R1032 Left lower quadrant pain: Secondary | ICD-10-CM | POA: Diagnosis not present

## 2024-03-17 ENCOUNTER — Ambulatory Visit: Payer: Self-pay | Admitting: Surgery

## 2024-03-17 DIAGNOSIS — Z9884 Bariatric surgery status: Secondary | ICD-10-CM | POA: Diagnosis not present

## 2024-03-17 DIAGNOSIS — R1032 Left lower quadrant pain: Secondary | ICD-10-CM | POA: Diagnosis not present

## 2024-03-17 NOTE — H&P (Signed)
 Maria Simmons [id number]   Referring Provider:  Royanne Foots, Obgyn   Subjective   Chief Complaint: NEW PROBLEM     History of Present Illness:    Very pleasant 58 year old woman with a history of thyroid cancer as well as lymphoma who presents for evaluation of abdominal pain and Lap-Band removal.  She has been experiencing left sided abdominal pain which seems to radiate throughout the left side of the abdomen and is more concentrated in the lower quadrants.  She had also been having rectal and vaginal bleeding despite being status post hysterectomy and reports that her gynecologist, Dr. Cherly Hensen, had her undergo a transvaginal and transabdominal pelvic ultrasound.  Reportedly, there was a 3 x 4 cm mass identified, the ultrasound report states "bump like area noted in lower abdomen, difficult to interpret??-4.31 x 3.41 cm" but does not describe the location whether it is intra-abdominal or in the abdominal wall.  The patient states that the physician reported that she was able to palpate this on abdominal exam.  Subsequently she was seen by 2 of my partners last month; a CT scan has subsequently been performed which I have reviewed personally including the images and the report.  This describes a small amount of fat stranding anterior to loops of bowel in the left hemipelvis nonspecific but may reflect sequela of inflammation or infection, diverticulosis, as well as a focus of soft tissue nodularity/stranding in the left anterior abdominal wall most commonly reflect sequelae of injections (patient states she does not do any injections in her abdomen recently although she has in the past).  She also had a colonoscopy in November, she had a few precancerous polyps and will follow-up with Dr. Bosie Clos.   She would like her Lap-Band removed, she is not using it and has not had it adjusted in quite some time and in fact has had all the fluid removed due to issues with dysphagia in the past.  Given  her history of cancer, she is understandably anxious to find out the source of her abdominal pain and would like all foreign bodies removed from her body at this time.  Review of Systems: A complete review of systems was obtained from the patient.  I have reviewed this information and discussed as appropriate with the patient.  See HPI as well for other ROS.   Medical History: Past Medical History:  Diagnosis Date   Glucose intolerance (impaired glucose tolerance)    Hypothyroid    Thyroid cancer (CMS/HHS-HCC)     Patient Active Problem List  Diagnosis   DLBCL (diffuse large B cell lymphoma) (CMS/HHS-HCC)   On antineoplastic chemotherapy   Hx of radiation therapy   Cancer of thyroid (CMS/HHS-HCC)    Past Surgical History:  Procedure Laterality Date   ABDOMINAL HYSTERECTOMY  12/14/1998   BARIATRIC SURGERY  05/28/2009   laparoscopic gastric banding   NEEDLE BIOPSY LYMPH NODE CERVICLE Left 10/12/2014   Portacath placement  10/24/2014   Port-a-cath removal  10/18/2015   ARTHROPLASTY HIP TOTAL Left 11/12/2017   LAPAROSCOPIC REPAIR OF PARAVAGINAL DEFECT (INCLUDING REPAIR OF CYSTOCELE, IF PERFORMED)  05/03/2018   ARTHROPLASTY HIP TOTAL Right 09/15/2019   LAPAROSCOPIC TUBAL LIGATION     REDUCTION MAMMAPLASTY     THYROIDECTOMY       Allergies  Allergen Reactions   Adhesive Tape-Silicones Itching    Surgical tape.   Other Rash    Fingers turned  Blue with metformin   Levofloxacin Rash   Penicillins Rash  Current Outpatient Medications on File Prior to Visit  Medication Sig Dispense Refill   aspirin 81 MG EC tablet Take by mouth once daily.      MOUNJARO 12.5 mg/0.5 mL pen injector Inject 12.5 mg subcutaneously     allopurinol (ZYLOPRIM) 300 MG tablet once daily.   3   biotin 5 mg Tab Take by mouth once daily.      cholecalciferol (VITAMIN D3) 1,000 unit tablet Take by mouth once daily.      cyanocobalamin (VITAMIN B12) 1000 MCG tablet Take 1,000 mcg by mouth once daily.      HYDROcodone-acetaminophen (NORCO) 5-325 mg tablet every 4 (four) hours as needed.   0   lidocaine-prilocaine (EMLA) cream as needed.   3   loperamide (IMODIUM) 1 mg/5 mL solution Take by mouth 4 (four) times daily as needed.      LORazepam (ATIVAN) 0.5 MG tablet 2 (two) times daily as needed.   0   multivitamin with iron-minerals (VITAMINS & MINERALS) tablet Take by mouth once daily.      ondansetron (ZOFRAN) 8 MG tablet every 12 (twelve) hours as needed.   1   predniSONE (DELTASONE) 20 MG tablet once daily. 100 mg for 5 days after chemo  2   prochlorperazine (COMPAZINE) 10 MG tablet every 6 (six) hours as needed.   6   SYNTHROID 150 mcg tablet once daily.   6   No current facility-administered medications on file prior to visit.    Family History  Problem Relation Age of Onset   Colon cancer Mother    Diabetes Father    Stroke Father    High blood pressure (Hypertension) Father    Hyperlipidemia (Elevated cholesterol) Father    Prostate cancer Father    Peripheral vascular disease Father    Kidney disease Father    Colon polyps Maternal Uncle    COPD Maternal Grandfather    Uterine cancer Paternal Grandmother      Social History   Tobacco Use  Smoking Status Never  Smokeless Tobacco Never     Social History   Socioeconomic History   Marital status: Single  Tobacco Use   Smoking status: Never   Smokeless tobacco: Never  Vaping Use   Vaping status: Never Used  Substance and Sexual Activity   Alcohol use: No   Drug use: No  Social History Narrative   Works for a Program[employer], multimedia doing billing, Psychologist, occupational.   Lives with her grown son and daughters   Social Drivers of Corporate investment banker Strain: Low Risk  (11/15/2023)   Received from [facility]   Overall Financial Resource Strain (CARDIA)    Difficulty of Paying Living Expenses: Not very hard  Food Insecurity: No Food Insecurity (11/15/2023)   Received from [facility]   Hunger Vital Sign    Worried  About Running Out of Food in the Last Year: Never true    Ran Out of Food in the Last Year: Never true  Transportation Needs: No Transportation Needs (11/15/2023)   Received from [facility] - Transportation    Lack of Transportation (Medical): No    Lack of Transportation (Non-Medical): No  Physical Activity: Insufficiently Active (11/15/2023)   Received from [facility]   Exercise Vital Sign    Days of Exercise per Week: 3 days    Minutes of Exercise per Session: 20 min  Stress: No Stress Concern Present (11/15/2023)   Received from [facility]  [employer] of Occupational Health - Occupational Stress Questionnaire    Feeling of Stress : Not at all  Social Connections: Moderately Isolated (11/15/2023)   Received from [facility]   Social Connection and Isolation Panel [NHANES]    Frequency of Communication with Friends and Family: More than three times a week    Frequency of Social Gatherings with Friends and Family: Three times a week    Attends Religious Services: More than 4 times per year    Active Member of Clubs or Organizations: No    Marital Status: Divorced  Housing Stability: Unknown (02/24/2024)   Housing Stability Vital Sign    Homeless in the Last Year: No    Objective:    Vitals:   03/17/24 0959  PainSc: 0-No pain    There is no height or weight on file to calculate BMI.  Gen: A&Ox3, no distress  Chest: respiratory effort is normal. Abdomen: soft, nondistended, tender in the left lower quadrant without palpable mass or abnormality Neuro: no gross deficit Psych: appropriate mood and affect, normal insight/judgment intact  Skin: warm and dry   Labs, Imaging and Diagnostic Testing: CT as above  Assessment and Plan:  Diagnoses and all orders for this visit:  Left lower quadrant abdominal pain  LAP-BAND surgery status    Will plan for laparoscopic removal of Lap-Band with concomitant diagnostic laparoscopy focused on the left lower  quadrant.  I discussed the CT report and imaging with her, wonder if the haziness noted in the fat tissue may reflect omental infarct which could certainly cause acute and subsequently chronic pain in this area but we will take a look at everything that we are able to grossly to see if we can identify any other source of her abdominal pain.  We discussed the procedure and reviewed risks including bleeding, infection, pain, scarring, injury to intra-abdominal structures, need for further surgery or intervention, as well as general cardiovascular/pulmonary/thromboembolic complications.  Questions were welcomed and answered to her satisfaction, she wishes to proceed.   Phylliss Blakes MD FACS

## 2024-03-23 ENCOUNTER — Encounter: Payer: Commercial Managed Care - PPO | Admitting: Nurse Practitioner

## 2024-03-27 ENCOUNTER — Other Ambulatory Visit (HOSPITAL_COMMUNITY): Payer: Self-pay

## 2024-03-27 ENCOUNTER — Other Ambulatory Visit: Payer: Self-pay

## 2024-04-11 NOTE — Patient Instructions (Signed)
 SURGICAL WAITING ROOM VISITATION  Patients having surgery or a procedure may have no more than 2 support people in the waiting area - these visitors may rotate.    Children under the age of 73 must have an adult with them who is not the patient.  Due to an increase in RSV and influenza rates and associated hospitalizations, children ages 49 and under may not visit patients in [facility] hospitals.  Visitors with respiratory illnesses are discouraged from visiting and should remain at home.  If the patient needs to stay at the hospital during part of their recovery, the visitor guidelines for inpatient rooms apply. Pre-op nurse will coordinate an appropriate time for 1 support person to accompany patient in pre-op.  This support person may not rotate.    Please refer to the [facility] website for the visitor guidelines for Inpatients (after your surgery is over and you are in a regular room).       Your procedure is scheduled on: 04/24/2024    Report to [facility] Main Entrance    Report to admitting at  0800 AM   Call this number if you have problems the morning of surgery [phone]   Do not eat food or drink liquids  :After Midnight.                           If you have questions, please contact your surgeon's office.       Oral Hygiene is also important to reduce your risk of infection.                                    Remember - BRUSH YOUR TEETH THE MORNING OF SURGERY WITH YOUR REGULAR TOOTHPASTE  DENTURES WILL BE REMOVED PRIOR TO SURGERY PLEASE DO NOT APPLY "Poly grip" OR ADHESIVES!!!   Do NOT smoke after Midnight   Stop all vitamins and herbal supplements 7 days before surgery.   Take these medicines the morning of surgery with A SIP OF WATER :  synthroid ,              Mounjaro - last dose on   DO NOT TAKE ANY ORAL DIABETIC MEDICATIONS DAY OF YOUR SURGERY  Bring CPAP mask and tubing day of surgery.                              You may not have  any metal on your body including hair pins, jewelry, and body piercing             Do not wear make-up, lotions, powders, perfumes/cologne, or deodorant  Do not wear nail polish including gel and S&S, artificial/acrylic nails, or any other type of covering on natural nails including finger and toenails. If you have artificial nails, gel coating, etc. that needs to be removed by a nail salon please have this removed prior to surgery or surgery may need to be canceled/ delayed if the surgeon/ anesthesia feels like they are unable to be safely monitored.   Do not shave  48 hours prior to surgery.               Men may shave face and neck.   Do not bring valuables to the hospital. [facility] IS NOT  RESPONSIBLE   FOR VALUABLES.   Contacts, glasses, dentures or bridgework may not be worn into surgery.   Bring small overnight bag day of surgery.   DO NOT BRING YOUR HOME MEDICATIONS TO THE HOSPITAL. PHARMACY WILL DISPENSE MEDICATIONS LISTED ON YOUR MEDICATION LIST TO YOU DURING YOUR ADMISSION IN THE HOSPITAL!    Patients discharged on the day of surgery will not be allowed to drive home.  Someone NEEDS to stay with you for the first 24 hours after anesthesia.   Special Instructions: Bring a copy of your healthcare power of attorney and living will documents the day of surgery if you haven't scanned them before.              Please read over the following fact sheets you were given: IF YOU HAVE QUESTIONS ABOUT YOUR PRE-OP INSTRUCTIONS PLEASE CALL [phone]   If you received a COVID test during your pre-op visit  it is requested that you wear a mask when out in public, stay away from anyone that may not be feeling well and notify your surgeon if you develop symptoms. If you test positive for Covid or have been in contact with anyone that has tested positive in the last 10 days please notify you surgeon.    [place] - Preparing for Surgery Before surgery, you can play an  important role.  Because skin is not sterile, your skin needs to be as free of germs as possible.  You can reduce the number of germs on your skin by washing with CHG (chlorahexidine gluconate) soap before surgery.  CHG is an antiseptic cleaner which kills germs and bonds with the skin to continue killing germs even after washing. Please DO NOT use if you have an allergy to CHG or antibacterial soaps.  If your skin becomes reddened/irritated stop using the CHG and inform your nurse when you arrive at Short Stay. Do not shave (including legs and underarms) for at least 48 hours prior to the first CHG shower.  You may shave your face/neck. Please follow these instructions carefully:  1.  Shower with CHG Soap the night before surgery and the  morning of Surgery.  2.  If you choose to wash your hair, wash your hair first as usual with your  normal  shampoo.  3.  After you shampoo, rinse your hair and body thoroughly to remove the  shampoo.                           4.  Use CHG as you would any other liquid soap.  You can apply chg directly  to the skin and wash                       Gently with a scrungie or clean washcloth.  5.  Apply the CHG Soap to your body ONLY FROM THE NECK DOWN.   Do not use on face/ open                           Wound or open sores. Avoid contact with eyes, ears mouth and genitals (private parts).                       Wash face,  Genitals (private parts) with your normal soap.             6.  Wash thoroughly,  paying special attention to the area where your surgery  will be performed.  7.  Thoroughly rinse your body with warm water  from the neck down.  8.  DO NOT shower/wash with your normal soap after using and rinsing off  the CHG Soap.                9.  Pat yourself dry with a clean towel.            10.  Wear clean pajamas.            11.  Place clean sheets on your bed the night of your first shower and do not  sleep with pets. Day of Surgery : Do not apply any  lotions/deodorants the morning of surgery.  Please wear clean clothes to the hospital/surgery center.  FAILURE TO FOLLOW THESE INSTRUCTIONS MAY RESULT IN THE CANCELLATION OF YOUR SURGERY PATIENT SIGNATURE_________________________________  NURSE SIGNATURE__________________________________  ________________________________________________________________________

## 2024-04-11 NOTE — Progress Notes (Addendum)
 Anesthesia Review:  PCP: Maria Simmons LOV 11/17/23  Cardiologist :Maria Simmons  LOV 06/03/2023  Endocrinologist- DR Maria Simmons   PPM/ ICD: Device Orders: Rep Notified:  Chest x-ray : EKG : 06/02/2023  Monitor- 06/24/23  Echo : 06/22/23  Stress test: Cardiac Cath :   Activity level: can do a flight of stairs without difficulty  Sleep Study/ CPAP : none  Fasting Blood Sugar :      / Checks Blood Sugar -- times a day:   DM- type 2 - does not check glucose at home  Hgba1c- 04/17/24- 5.5   Mounjaro -last dose on 04/16/24  Pt's glucose at preop was 74.  PT voices no complaints.  PT going to eat lunch after preop appt.   Blood Thinner/ Instructions [name] Dose: ASA / Instructions/ Last Dose :  81 mg aspirin 

## 2024-04-17 ENCOUNTER — Encounter (HOSPITAL_COMMUNITY): Payer: Self-pay

## 2024-04-17 ENCOUNTER — Encounter (HOSPITAL_COMMUNITY)
Admission: RE | Admit: 2024-04-17 | Discharge: 2024-04-17 | Disposition: A | Discharge status: Home / Self Care | Source: Ambulatory Visit | Attending: Surgery | Admitting: Surgery

## 2024-04-17 ENCOUNTER — Other Ambulatory Visit: Payer: Self-pay

## 2024-04-17 VITALS — BP 138/88 | HR 104 | Temp 98.3°F | Resp 16 | Ht 64.0 in | Wt 186.0 lb

## 2024-04-17 DIAGNOSIS — Z79890 Hormone replacement therapy (postmenopausal): Secondary | ICD-10-CM | POA: Insufficient documentation

## 2024-04-17 DIAGNOSIS — Z8673 Personal history of transient ischemic attack (TIA), and cerebral infarction without residual deficits: Secondary | ICD-10-CM | POA: Insufficient documentation

## 2024-04-17 DIAGNOSIS — Z9884 Bariatric surgery status: Secondary | ICD-10-CM | POA: Diagnosis not present

## 2024-04-17 DIAGNOSIS — Z79899 Other long term (current) drug therapy: Secondary | ICD-10-CM | POA: Insufficient documentation

## 2024-04-17 DIAGNOSIS — Z85850 Personal history of malignant neoplasm of thyroid: Secondary | ICD-10-CM | POA: Insufficient documentation

## 2024-04-17 DIAGNOSIS — R1032 Left lower quadrant pain: Secondary | ICD-10-CM | POA: Diagnosis not present

## 2024-04-17 DIAGNOSIS — Z923 Personal history of irradiation: Secondary | ICD-10-CM | POA: Insufficient documentation

## 2024-04-17 DIAGNOSIS — Z9221 Personal history of antineoplastic chemotherapy: Secondary | ICD-10-CM | POA: Diagnosis not present

## 2024-04-17 DIAGNOSIS — Z01812 Encounter for preprocedural laboratory examination: Secondary | ICD-10-CM | POA: Diagnosis not present

## 2024-04-17 DIAGNOSIS — Z01818 Encounter for other preprocedural examination: Secondary | ICD-10-CM | POA: Diagnosis present

## 2024-04-17 DIAGNOSIS — Z8572 Personal history of non-Hodgkin lymphomas: Secondary | ICD-10-CM | POA: Diagnosis not present

## 2024-04-17 DIAGNOSIS — E089 Diabetes mellitus due to underlying condition without complications: Secondary | ICD-10-CM | POA: Insufficient documentation

## 2024-04-17 HISTORY — DX: Cerebral infarction, unspecified: I63.9

## 2024-04-17 LAB — CBC
HCT: 39.7 % (ref 36.0–46.0)
Hemoglobin: 12.4 g/dL (ref 12.0–15.0)
MCH: 27.9 pg (ref 26.0–34.0)
MCHC: 31.2 g/dL (ref 30.0–36.0)
MCV: 89.4 fL (ref 80.0–100.0)
Platelets: 371 10*3/uL (ref 150–400)
RBC: 4.44 MIL/uL (ref 3.87–5.11)
RDW: 13.2 % (ref 11.5–15.5)
WBC: 4.7 10*3/uL (ref 4.0–10.5)
nRBC: 0 % (ref 0.0–0.2)

## 2024-04-17 LAB — BASIC METABOLIC PANEL WITH GFR
Anion gap: 8 (ref 5–15)
BUN: 11 mg/dL (ref 6–20)
CO2: 26 mmol/L (ref 22–32)
Calcium: 9.3 mg/dL (ref 8.9–10.3)
Chloride: 105 mmol/L (ref 98–111)
Creatinine, Ser: 0.74 mg/dL (ref 0.44–1.00)
GFR, Estimated: >60 mL/min (ref >60)
Glucose, Bld: 94 mg/dL (ref 70–99)
Potassium: 4.4 mmol/L (ref 3.5–5.1)
Sodium: 139 mmol/L (ref 135–145)

## 2024-04-17 LAB — HEMOGLOBIN A1C
Hgb A1c MFr Bld: 5.5 % (ref 4.8–5.6)
Mean Plasma Glucose: 111.15 mg/dL

## 2024-04-17 LAB — GLUCOSE, CAPILLARY: Glucose-Capillary: 74 mg/dL (ref 70–99)

## 2024-04-19 ENCOUNTER — Encounter (HOSPITAL_COMMUNITY): Payer: Self-pay

## 2024-04-19 NOTE — Progress Notes (Signed)
 Case: [id number] Date/Time: 04/24/24 0945   Procedures:      REMOVAL, GASTRIC BAND, LAPAROSCOPIC     LAPAROSCOPY, DIAGNOSTIC   Anesthesia type: General   Diagnosis:      Abdominal pain, left lower quadrant [R10.32]     Bariatric surgery status [Z98.84]   Pre-op diagnosis: LAP BAND ABDOMINAL PAIN   [facility] ROOM 01 / [facility]   Surgeons: Adalberto Acton, MD       DISCUSSION: Maria Simmons is a 58 yo female who presents to PAT prior to surgery above. PMH of hx of hx of TIA (2022), hx of gastric banding with hiatal hernia repair (2010), thyroid  cancer s/p thyroidectomy (2007), post procedure hypothyroidism, hx of lymphoma s/p chemo and radiation (in remission), DM (A1c 5.5).  Prior anesthesia complications include PONV  Patient seen by Cardiology on 05/31/23 for tachycardia and exertional dyspnea. Underwent echo and cardiac monitoring which were both normal. No follow up advised.  Patient saw PCP on 11/17/23. Noted that SOB and palpitations were improved with weight loss.   LD Mounjaro : 5/4  VS: BP 138/88   Pulse (!) 104   Temp 36.8 C (Oral)   Resp 16   Ht 5\' 4"  (1.626 m)   Wt 84.4 kg   LMP 04/13/2000 (Exact Date)   SpO2 100%   BMI 31.93 kg/m   PROVIDERS: Susanna Epley, FNP   LABS: Labs reviewed: Acceptable for surgery. (all labs ordered are listed, but only abnormal results are displayed)  Labs Reviewed  CBC  BASIC METABOLIC PANEL WITH GFR  HEMOGLOBIN A1C  GLUCOSE, CAPILLARY     IMAGES: CT A/P 03/01/24:  IMPRESSION: 1. Small amount of fat stranding anterior to loops of bowel in the left hemipelvis, nonspecific but may reflect sequela of infection/inflammation. 2. Colonic diverticulosis without evidence of acute diverticulitis. 3. Focus of soft tissue nodularity/stranding in the left anterior abdominal wall most commonly reflect sequela of subcutaneous injections. 4. Pelvic structures are obscured by streak artifact from bilateral hip  arthroplasties.  EKG:   CV:  Echo 06/22/23:  IMPRESSIONS    1. Left ventricular ejection fraction, by estimation, is 55 to 60%. The left ventricle has normal function. The left ventricle has no regional wall motion abnormalities. Left ventricular diastolic parameters are consistent with Grade I diastolic dysfunction (impaired relaxation). The average left ventricular global longitudinal strain is -18.7 %. The global longitudinal strain is normal.  2. Right ventricular systolic function is normal. The right ventricular size is normal.  3. The mitral valve is normal in structure. Trivial mitral valve regurgitation. No evidence of mitral stenosis.  4. The aortic valve is tricuspid. Aortic valve regurgitation is not visualized. No aortic stenosis is present.  5. The inferior vena cava is normal in size with greater than 50% respiratory variability, suggesting right atrial pressure of 3 mmHg.  Cardiac monitor 06/22/23: 3 triggered events for sinus tachycardia/ sinus rhythm. No significant tachyarrhythmia or bradyarrhythmia. No atrial fibrillation or flutter.     Patch Wear Time:  13 days and 23 hours (2024-06-20T21:55:26-0400 to 2024-07-04T21:55:22-0400)   Patient had a min HR of 59 bpm, max HR of 169 bpm, and avg HR of 98 bpm. Predominant underlying rhythm was Sinus Rhythm. Isolated SVEs were rare (<1.0%), and no SVE Couplets or SVE Triplets were present. Isolated VEs were rare (<1.0%), and no VE Couplets  or VE Triplets were present. Ventricular Trigeminy was present.     Past Medical History:  Diagnosis Date   Arthritis of shoulder region,  right, degenerative    right shoulder    Bladder prolapse, female, acquired    Cancer (HCC) 2007   thyroid , papillary   Change in bowel habits 03/19/2016   Constipation    Diabetes mellitus without complication (HCC)    DLBCL (diffuse large B cell lymphoma) (HCC)    History of B-cell lymphoma 11/18/2015   Hypothyroidism    Neutropenic  fever (HCC) 11/01/2014   PONV (postoperative nausea and vomiting)    during hysterectomy    S/P radiation therapy 02/05/15-02/27/15   tonsil/neck,lt subpectoral nodal bed 30.6Gy /17 fx   Stroke [facility])    TIA 05/2021   Thyroid  cancer (HCC)    Wears glasses     Past Surgical History:  Procedure Laterality Date   ABDOMINAL HYSTERECTOMY  12/14/1998   BREAST SURGERY     Breast reduction   COLONOSCOPY     COLONOSCOPY WITH PROPOFOL  N/A 06/19/2016   Procedure: COLONOSCOPY WITH PROPOFOL ;  Surgeon: Lanita Pitman, MD;  Location: [facility] ENDOSCOPY;  Service: Endoscopy;  Laterality: N/A;   COSMETIC SURGERY     Breast reduction   CYSTOCELE REPAIR N/A 05/03/2018   Procedure: ANTERIOR REPAIR (CYSTOCELE);  Surgeon: Erman Hayward, MD;  [facility];  Service: Urology;  Laterality: N/A;   CYSTOSCOPY N/A 05/03/2018   Procedure: CYSTOSCOPY;  Surgeon: Erman Hayward, MD;  [facility];  Service: Urology;  Laterality: N/A;   ESOPHAGOGASTRODUODENOSCOPY (EGD) WITH PROPOFOL  N/A 06/19/2016   Procedure: ESOPHAGOGASTRODUODENOSCOPY (EGD) WITH PROPOFOL ;  Surgeon: Lanita Pitman, MD;  Location: [facility]COPY;  Service: Endoscopy;  Laterality: N/A;   JOINT REPLACEMENT     Both hips   LAPAROSCOPIC GASTRIC BANDING WITH HIATAL HERNIA REPAIR  05/28/2009   left hip arthroplasty      lipomas removal      multiple   LYMPH NODE BIOPSY Left 10/12/2014   Procedure: excisional biopsy deep left neck lymph node;  Surgeon: Boyce Byes, MD;  Location: 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   TOTAL HIP ARTHROPLASTY Left 11/12/2017   Procedure: LEFT TOTAL HIP ARTHROPLASTY ANTERIOR APPROACH;  Surgeon: Neil Balls, MD;  [facility];  Service: Orthopedics;  Laterality: Left;   TOTAL HIP ARTHROPLASTY Right 09/15/2019   Procedure: TOTAL HIP ARTHROPLASTY ANTERIOR APPROACH;  Surgeon: Neil Balls, MD;  [facility];  Service: Orthopedics;  Laterality: Right;   TUBAL LIGATION  12/14/1988   VAGINAL PROLAPSE REPAIR N/A 05/03/2018   Procedure: VAULT PROLAPSE REPAIR WITH GRAFT;  Surgeon: Erman Hayward, MD;  [facility];  Service: Urology;  Laterality: N/A;    MEDICATIONS:  aspirin  81 MG EC tablet   atorvastatin  (LIPITOR) 40 MG tablet   levothyroxine  (SYNTHROID ) 137 MCG tablet   tirzepatide  (MOUNJARO ) 12.5 MG/0.5ML Pen   Vitamin D , Ergocalciferol , (DRISDOL ) 1.25 MG (50000 UNIT) CAPS capsule   No current facility-administered medications for this encounter.    Antoinette Kirschner [facility] Short Stay/Anesthesiology [facility] Phone [phone] 04/19/2024 1:41 PM

## 2024-04-24 ENCOUNTER — Other Ambulatory Visit (HOSPITAL_COMMUNITY): Payer: Self-pay

## 2024-04-24 ENCOUNTER — Encounter (HOSPITAL_COMMUNITY): Payer: Self-pay | Admitting: Surgery

## 2024-04-24 ENCOUNTER — Ambulatory Visit (HOSPITAL_COMMUNITY): Payer: Self-pay | Admitting: Physician Assistant

## 2024-04-24 ENCOUNTER — Ambulatory Visit (HOSPITAL_COMMUNITY)
Admission: RE | Admit: 2024-04-24 | Discharge: 2024-04-24 | Disposition: A | Discharge status: Home / Self Care | Attending: Surgery | Admitting: Surgery

## 2024-04-24 ENCOUNTER — Ambulatory Visit (HOSPITAL_BASED_OUTPATIENT_CLINIC_OR_DEPARTMENT_OTHER): Admitting: Anesthesiology

## 2024-04-24 ENCOUNTER — Encounter (HOSPITAL_COMMUNITY)
Admission: RE | Disposition: A | Discharge status: Home / Self Care | Payer: Self-pay | Source: Home / Self Care | Attending: Surgery

## 2024-04-24 ENCOUNTER — Other Ambulatory Visit: Payer: Self-pay

## 2024-04-24 DIAGNOSIS — E119 Type 2 diabetes mellitus without complications: Secondary | ICD-10-CM | POA: Diagnosis not present

## 2024-04-24 DIAGNOSIS — Z9884 Bariatric surgery status: Secondary | ICD-10-CM | POA: Diagnosis not present

## 2024-04-24 DIAGNOSIS — Z923 Personal history of irradiation: Secondary | ICD-10-CM | POA: Insufficient documentation

## 2024-04-24 DIAGNOSIS — R1032 Left lower quadrant pain: Secondary | ICD-10-CM | POA: Diagnosis not present

## 2024-04-24 DIAGNOSIS — K9509 Other complications of gastric band procedure: Secondary | ICD-10-CM | POA: Diagnosis not present

## 2024-04-24 DIAGNOSIS — Z01818 Encounter for other preprocedural examination: Secondary | ICD-10-CM

## 2024-04-24 DIAGNOSIS — K660 Peritoneal adhesions (postprocedural) (postinfection): Secondary | ICD-10-CM

## 2024-04-24 DIAGNOSIS — Z4651 Encounter for fitting and adjustment of gastric lap band: Secondary | ICD-10-CM | POA: Diagnosis not present

## 2024-04-24 DIAGNOSIS — Z85850 Personal history of malignant neoplasm of thyroid: Secondary | ICD-10-CM | POA: Diagnosis not present

## 2024-04-24 HISTORY — PX: LAPAROSCOPY: SHX197

## 2024-04-24 LAB — GLUCOSE, CAPILLARY: Glucose-Capillary: 95 mg/dL (ref 70–99)

## 2024-04-24 SURGERY — REMOVAL, GASTRIC BAND, LAPAROSCOPIC
Anesthesia: General

## 2024-04-24 MED ORDER — HYDROMORPHONE HCL 1 MG/ML IJ SOLN
INTRAMUSCULAR | Status: AC
  Administered 2024-04-24: 0.5 mg via INTRAVENOUS
  Filled 2024-04-24: qty 1

## 2024-04-24 MED ORDER — LIDOCAINE HCL (CARDIAC) PF 100 MG/5ML IV SOSY
PREFILLED_SYRINGE | INTRAVENOUS | Status: DC | PRN
  Administered 2024-04-24: 60 mg via INTRAVENOUS

## 2024-04-24 MED ORDER — CEFAZOLIN SODIUM-DEXTROSE 2-4 GM/100ML-% IV SOLN
2.0000 g | INTRAVENOUS | Status: AC
  Administered 2024-04-24: 2 g via INTRAVENOUS
  Filled 2024-04-24: qty 100

## 2024-04-24 MED ORDER — CHLORHEXIDINE GLUCONATE 0.12 % MT SOLN
15.0000 mL | Freq: Once | OROMUCOSAL | Status: AC
  Administered 2024-04-24: 15 mL via OROMUCOSAL

## 2024-04-24 MED ORDER — ORAL CARE MOUTH RINSE: 15.0000 mL | Freq: Once | OROMUCOSAL | Status: AC

## 2024-04-24 MED ORDER — HYDROMORPHONE HCL 1 MG/ML IJ SOLN
0.2500 mg | INTRAMUSCULAR | Status: DC | PRN
  Administered 2024-04-24: 0.5 mg via INTRAVENOUS

## 2024-04-24 MED ORDER — ACETAMINOPHEN 325 MG PO TABS: 650.0000 mg | ORAL_TABLET | ORAL | Status: DC | PRN

## 2024-04-24 MED ORDER — LACTATED RINGERS IV SOLN: INTRAVENOUS | Status: DC

## 2024-04-24 MED ORDER — PROPOFOL 1000 MG/100ML IV EMUL
INTRAVENOUS | Status: AC
  Filled 2024-04-24: qty 100

## 2024-04-24 MED ORDER — FENTANYL CITRATE (PF) 100 MCG/2ML IJ SOLN
INTRAMUSCULAR | Status: DC | PRN
  Administered 2024-04-24: 25 ug via INTRAVENOUS
  Administered 2024-04-24 (×2): 50 ug via INTRAVENOUS
  Administered 2024-04-24: 25 ug via INTRAVENOUS
  Administered 2024-04-24: 50 ug via INTRAVENOUS

## 2024-04-24 MED ORDER — OXYCODONE HCL 5 MG PO TABS: 5.0000 mg | ORAL_TABLET | ORAL | Status: DC | PRN

## 2024-04-24 MED ORDER — LIDOCAINE HCL (PF) 2 % IJ SOLN
INTRAMUSCULAR | Status: AC
Start: 2024-04-24 — End: ?
  Filled 2024-04-24: qty 5

## 2024-04-24 MED ORDER — PROPOFOL 500 MG/50ML IV EMUL
INTRAVENOUS | Status: DC | PRN
  Administered 2024-04-24: 75 ug/kg/min via INTRAVENOUS

## 2024-04-24 MED ORDER — HEMOSTATIC AGENTS (NO CHARGE) OPTIME
TOPICAL | Status: DC | PRN
  Administered 2024-04-24: 1

## 2024-04-24 MED ORDER — BUPIVACAINE LIPOSOME 1.3 % IJ SUSP: 20.0000 mL | Freq: Once | INTRAMUSCULAR | Status: DC

## 2024-04-24 MED ORDER — PROPOFOL 10 MG/ML IV BOLUS
INTRAVENOUS | Status: AC
  Filled 2024-04-24: qty 20

## 2024-04-24 MED ORDER — DOCUSATE SODIUM 100 MG PO CAPS
100.0000 mg | ORAL_CAPSULE | Freq: Two times a day (BID) | ORAL | 0 refills | Status: AC
  Filled 2024-04-24: qty 30, 15d supply, fill #0

## 2024-04-24 MED ORDER — DEXAMETHASONE SODIUM PHOSPHATE 10 MG/ML IJ SOLN
INTRAMUSCULAR | Status: AC
  Filled 2024-04-24: qty 1

## 2024-04-24 MED ORDER — ACETAMINOPHEN 650 MG RE SUPP: 650.0000 mg | RECTAL | Status: DC | PRN

## 2024-04-24 MED ORDER — OXYCODONE HCL 5 MG PO TABS: 5.0000 mg | ORAL_TABLET | Freq: Once | ORAL | Status: DC | PRN

## 2024-04-24 MED ORDER — MIDAZOLAM HCL 5 MG/5ML IJ SOLN
INTRAMUSCULAR | Status: DC | PRN
  Administered 2024-04-24: 2 mg via INTRAVENOUS

## 2024-04-24 MED ORDER — ACETAMINOPHEN 500 MG PO TABS: 1000.0000 mg | ORAL_TABLET | Freq: Once | ORAL | Status: DC

## 2024-04-24 MED ORDER — SUGAMMADEX SODIUM 200 MG/2ML IV SOLN
INTRAVENOUS | Status: DC | PRN
  Administered 2024-04-24: 200 mg via INTRAVENOUS

## 2024-04-24 MED ORDER — BUPIVACAINE-EPINEPHRINE 0.25% -1:200000 IJ SOLN
INTRAMUSCULAR | Status: DC | PRN
Start: 2024-04-24 — End: 2024-04-24
  Administered 2024-04-24: 15 mL

## 2024-04-24 MED ORDER — OXYCODONE HCL 5 MG/5ML PO SOLN: 5.0000 mg | Freq: Once | ORAL | Status: DC | PRN

## 2024-04-24 MED ORDER — BUPIVACAINE-EPINEPHRINE (PF) 0.25% -1:200000 IJ SOLN
INTRAMUSCULAR | Status: AC
  Filled 2024-04-24: qty 30

## 2024-04-24 MED ORDER — OXYCODONE HCL 5 MG PO TABS
5.0000 mg | ORAL_TABLET | Freq: Three times a day (TID) | ORAL | 0 refills | Status: AC | PRN
  Filled 2024-04-24: qty 15, 5d supply, fill #0

## 2024-04-24 MED ORDER — ROCURONIUM BROMIDE 10 MG/ML (PF) SYRINGE
PREFILLED_SYRINGE | INTRAVENOUS | Status: AC
  Filled 2024-04-24: qty 10

## 2024-04-24 MED ORDER — ONDANSETRON HCL 4 MG/2ML IJ SOLN
INTRAMUSCULAR | Status: DC | PRN
  Administered 2024-04-24: 4 mg via INTRAVENOUS

## 2024-04-24 MED ORDER — PROPOFOL 10 MG/ML IV BOLUS
INTRAVENOUS | Status: DC | PRN
  Administered 2024-04-24: 100 mg via INTRAVENOUS

## 2024-04-24 MED ORDER — ONDANSETRON HCL 4 MG/2ML IJ SOLN
INTRAMUSCULAR | Status: AC
  Filled 2024-04-24: qty 2

## 2024-04-24 MED ORDER — DEXAMETHASONE SODIUM PHOSPHATE 10 MG/ML IJ SOLN
INTRAMUSCULAR | Status: DC | PRN
  Administered 2024-04-24: 8 mg via INTRAVENOUS

## 2024-04-24 MED ORDER — ACETAMINOPHEN 500 MG PO TABS
1000.0000 mg | ORAL_TABLET | ORAL | Status: AC
Start: 2024-04-24 — End: 2024-04-24
  Administered 2024-04-24: 1000 mg via ORAL
  Filled 2024-04-24: qty 2

## 2024-04-24 MED ORDER — FENTANYL CITRATE (PF) 100 MCG/2ML IJ SOLN
INTRAMUSCULAR | Status: AC
  Filled 2024-04-24: qty 2

## 2024-04-24 MED ORDER — GABAPENTIN 300 MG PO CAPS
300.0000 mg | ORAL_CAPSULE | ORAL | Status: AC
  Administered 2024-04-24: 300 mg via ORAL
  Filled 2024-04-24: qty 1

## 2024-04-24 MED ORDER — CHLORHEXIDINE GLUCONATE 4 % EX SOLN: 60.0000 mL | Freq: Once | CUTANEOUS | Status: DC

## 2024-04-24 MED ORDER — FENTANYL CITRATE PF 50 MCG/ML IJ SOSY: 25.0000 ug | PREFILLED_SYRINGE | INTRAMUSCULAR | Status: DC | PRN

## 2024-04-24 MED ORDER — ROCURONIUM BROMIDE 100 MG/10ML IV SOLN
INTRAVENOUS | Status: DC | PRN
  Administered 2024-04-24: 50 mg via INTRAVENOUS

## 2024-04-24 MED ORDER — 0.9 % SODIUM CHLORIDE (POUR BTL) OPTIME
TOPICAL | Status: DC | PRN
  Administered 2024-04-24: 1000 mL

## 2024-04-24 MED ORDER — MIDAZOLAM HCL 2 MG/2ML IJ SOLN
INTRAMUSCULAR | Status: AC
Start: 2024-04-24 — End: ?
  Filled 2024-04-24: qty 2

## 2024-04-24 MED ORDER — ONDANSETRON HCL 4 MG/2ML IJ SOLN: 4.0000 mg | Freq: Once | INTRAMUSCULAR | Status: DC | PRN

## 2024-04-24 MED ORDER — INSULIN ASPART 100 UNIT/ML IJ SOLN: 0.0000 [IU] | INTRAMUSCULAR | Status: DC | PRN

## 2024-04-24 MED ORDER — AMISULPRIDE (ANTIEMETIC) 5 MG/2ML IV SOLN: 10.0000 mg | Freq: Once | INTRAVENOUS | Status: DC | PRN

## 2024-04-24 SURGICAL SUPPLY — 45 items
BAG COUNTER SPONGE SURGICOUNT (BAG) IMPLANT
BLADE EXTENDED COATED 6.5IN (ELECTRODE) IMPLANT
BLADE SURG SZ10 CARB STEEL (BLADE) IMPLANT
CELLS DAT CNTRL 66122 CELL SVR (MISCELLANEOUS) IMPLANT
CHLORAPREP W/TINT 26 (MISCELLANEOUS) ×1 IMPLANT
CLIP APPLIE 5 13 M/L LIGAMAX5 (MISCELLANEOUS) IMPLANT
CLIP APPLIE ROT 10 11.4 M/L (STAPLE) IMPLANT
COVER MAYO STAND STRL (DRAPES) IMPLANT
COVER SURGICAL LIGHT HANDLE (MISCELLANEOUS) ×1 IMPLANT
DERMABOND ADVANCED .7 DNX12 (GAUZE/BANDAGES/DRESSINGS) IMPLANT
DRAPE WARM FLUID 44X44 (DRAPES) IMPLANT
DRSG TEGADERM 2-3/8X2-3/4 SM (GAUZE/BANDAGES/DRESSINGS) IMPLANT
ELECT REM PT RETURN 15FT ADLT (MISCELLANEOUS) ×1 IMPLANT
GAUZE SPONGE 2X2 8PLY STRL LF (GAUZE/BANDAGES/DRESSINGS) IMPLANT
GAUZE SPONGE 4X4 12PLY STRL (GAUZE/BANDAGES/DRESSINGS) ×1 IMPLANT
GOWN STRL REUS W/ TWL LRG LVL3 (GOWN DISPOSABLE) ×1 IMPLANT
GOWN STRL REUS W/ TWL XL LVL3 (GOWN DISPOSABLE) ×1 IMPLANT
HANDLE SUCTION POOLE (INSTRUMENTS) IMPLANT
HEMOSTAT SNOW SURGICEL 2X4 (HEMOSTASIS) IMPLANT
IRRIGATION SUCT STRKRFLW 2 WTP (MISCELLANEOUS) IMPLANT
KIT BASIN OR (CUSTOM PROCEDURE TRAY) ×1 IMPLANT
KIT TURNOVER KIT A (KITS) IMPLANT
RETRACTOR WND ALEXIS 18 MED (MISCELLANEOUS) IMPLANT
SCISSORS LAP 5X35 DISP (ENDOMECHANICALS) ×1 IMPLANT
SHEARS HARMONIC 36 ACE (MISCELLANEOUS) IMPLANT
SLEEVE Z-THREAD 5X100MM (TROCAR) IMPLANT
SPIKE FLUID TRANSFER (MISCELLANEOUS) ×1 IMPLANT
STRIP CLOSURE SKIN 1/2X4 (GAUZE/BANDAGES/DRESSINGS) IMPLANT
SUT PDS AB 1 TP1 96 (SUTURE) IMPLANT
SUT PROLENE 2 0 SH DA (SUTURE) IMPLANT
SUT SILK 2 0 SH CR/8 (SUTURE) IMPLANT
SUT SILK 2-0 18XBRD TIE 12 (SUTURE) IMPLANT
SUT SILK 3 0 SH CR/8 (SUTURE) IMPLANT
SUT SILK 3-0 18XBRD TIE 12 (SUTURE) IMPLANT
SUT VIC AB 3-0 SH 27XBRD (SUTURE) IMPLANT
SYR BULB IRRIG 60ML STRL (SYRINGE) IMPLANT
SYSTEM LAPSCP GELPORT 120MM (MISCELLANEOUS) IMPLANT
TOWEL OR 17X26 10 PK STRL BLUE (TOWEL DISPOSABLE) ×1 IMPLANT
TRAY FOLEY MTR SLVR 16FR STAT (SET/KITS/TRAYS/PACK) ×1 IMPLANT
TRAY LAPAROSCOPIC (CUSTOM PROCEDURE TRAY) ×1 IMPLANT
TROCAR 11X100 Z THREAD (TROCAR) IMPLANT
TROCAR XCEL NON-BLD 5MMX100MML (ENDOMECHANICALS) IMPLANT
TROCAR Z THREAD OPTICAL 12X100 (TROCAR) IMPLANT
TROCAR Z-THREAD OPTICAL 5X100M (TROCAR) ×1 IMPLANT
YANKAUER SUCT BULB TIP NO VENT (SUCTIONS) IMPLANT

## 2024-04-24 NOTE — Anesthesia Postprocedure Evaluation (Signed)
 Anesthesia Post Note  Patient: Maria Simmons  Procedure(s) Performed: REMOVAL, GASTRIC BAND, LAPAROSCOPIC LAPAROSCOPY, DIAGNOSTIC     Patient location during evaluation: PACU Anesthesia Type: General Level of consciousness: awake and alert, oriented and patient cooperative Pain management: pain level controlled Vital Signs Assessment: post-procedure vital signs reviewed and stable Respiratory status: spontaneous breathing, nonlabored ventilation and respiratory function stable Cardiovascular status: blood pressure returned to baseline and stable Postop Assessment: no apparent nausea or vomiting Anesthetic complications: no   No notable events documented.  Last Vitals:  Vitals:   04/24/24 1145 04/24/24 1200  BP: (!) 180/96 (!) 175/86  Pulse: 88 79  Resp: (!) 22 15  Temp:    SpO2: 100% 100%    Last Pain:  Vitals:   04/24/24 1200  TempSrc:   PainSc: Asleep                 Jacquelyne Matte

## 2024-04-24 NOTE — Anesthesia Procedure Notes (Signed)
 Procedure Name: Intubation Date/Time: 04/24/2024 9:59 AM  Performed by: Elaina Graver, CRNAPre-anesthesia Checklist: Patient identified, Emergency Drugs available, Suction available and Patient being monitored Patient Re-evaluated:Patient Re-evaluated prior to induction Oxygen  Delivery Method: Circle System Utilized Preoxygenation: Pre-oxygenation with 100% oxygen  Induction Type: IV induction Ventilation: Mask ventilation without difficulty Laryngoscope Size: Mac and 4 Grade View: Grade II Tube type: Oral Tube size: 7.0 mm Number of attempts: 1 Airway Equipment and Method: Stylet Placement Confirmation: ETT inserted through vocal cords under direct vision, positive ETCO2 and breath sounds checked- equal and bilateral Secured at: 22 cm Tube secured with: Tape Dental Injury: Teeth and Oropharynx as per pre-operative assessment  Comments: Intubation by Tyra Galley (EMT student) x1

## 2024-04-24 NOTE — Op Note (Signed)
 Operative Note  Maria Simmons  [id number]  [id number]  04/24/2024   Surgeon: Aldon Hung MD FACS   Assistant: Teddie Favre MD FACS   Procedure performed: Diagnostic laparoscopy, upper scopic lysis of adhesions x 50 minutes, removal of lap band   Preop diagnosis: Lap-Band status, left-sided abdominal pain Post-op diagnosis/intraop findings: Dense omental adhesions along the anterior abdominal wall in the region of pain in the left hemiabdomen; disrupted Lap-Band tubing at the level of the titanium connector   Specimens: no Retained items: no  EBL: 10cc Complications: none   Description of procedure: After obtaining informed consent the patient was taken to the operating room and placed supine on operating room table where general endotracheal anesthesia was initiated, preoperative antibiotics were administered, SCDs applied, and a formal timeout was performed.  The abdomen was prepped and draped in usual sterile fashion.  Peritoneal access was gained using optical entry in the left upper quadrant followed by uneventful insufflation to 15 mL mercury.  There was no injury from trocar placement.  Omental adhesions along the anterior abdominal wall towards the left side were immediately noted along with adhesions to the falciform and upper midline.  The distal stomach appears distended.  Under direct visualization, a left-sided 15 mm trocar and 5 mm trocar were placed along with a supraumbilical 5 mm trocar.  A careful lysis of adhesions ensued to clear the abdominal wall of omental adhesions.  The omental adhesions in the left hemiabdomen along the abdominal wall were particularly dense with a cicatrix component.  These adhesions extended down towards the left lower quadrant.  All of this was able to ultimately be freed with a combination of sharp, blunt, and harmonic scalpel dissection.  The colon and small bowel were carefully protected throughout this dissection.  Omental adhesions  along the upper midline into the falciform ligament were also taken down sharply and with harmonic scalpel were necessary ensuring hemostasis as we went.  After clearing these adhesions, the previously adhered omentum was inspected and did have chronic scar appearance.  The underlying bowel was reinspected and confirmed to be free of injury or any other abnormality.  No other visible abnormalities in the left hemiabdomen.  We then turned to the upper abdomen.  A liver retractor was inserted through a subxiphoid incision in the left lobe of the liver gently retracted cephalad.  The undersurface of the left lobe was densely adherent to the Lap-Band.  Cautery was used to divide cicatrix and adhesions overlying the Lap-Band staying along the medial aspect.  We were able to free the buckle and this was unlatched and the Lap-Band was able to be removed intact.  As we pulled the Lap-Band through, we noted that the tubing was already transected.  The metal connector was not within this section of tubing.  The Lap-Band and the section of tubing were removed through our 15 mm trocar site.  The stomach and undersurface of the liver were inspected and the stomach appears free of injury.  Some of the liver capsule had been avulsed along the very lateral aspect of the undersurface of the left lobe.  This appeared hemostatic however a piece of Surgicel snow was placed here for additional reinforcement.  The liver retractor was removed.  We then created an incision over the port in the subcutaneous tissues and the port and associated mesh was excised along with the remainder of the tubing.  The very end of this tubing did contain the metal connector and this appeared  to be where the band had become disrupted at some point, likely forming the nidus for the adhesions along the anterior abdominal wall.  The abdomen was reinsufflated and inspected 1 more time, hemostasis was ensured and no other abnormalities were present.  The 15mm  trocar site was closed with a 0 Vicryl in the fascia using a laparoscopic suture passer under direct visualization.  The abdomen was then desufflated and trocars removed.  Skin incisions were closed with subcuticular 4-0 Monocryl followed by benzoin, Steri-Strips and Band-Aids.  The patient was then awakened, extubated and taken to PACU in stable condition.    All counts were correct at the completion of the case.

## 2024-04-24 NOTE — Anesthesia Preprocedure Evaluation (Addendum)
 Anesthesia Evaluation  Patient identified by MRN, date of birth, ID band Patient awake    Reviewed: Allergy & Precautions, NPO status , Patient's Chart, lab work & pertinent test results  Airway Mallampati: II  TM Distance: >3 FB Neck ROM: Full    Dental  (+) Teeth Intact, Dental Advisory Given   Pulmonary neg pulmonary ROS   Pulmonary exam normal breath sounds clear to auscultation       Cardiovascular negative cardio ROS Normal cardiovascular exam Rhythm:Regular Rate:Normal     Neuro/Psych Some weakness L side from bells palsy, not from TIA TIA (05/2021) negative psych ROS   GI/Hepatic negative GI ROS, Neg liver ROS,,,  Endo/Other  diabetesHypothyroidism  BMI 32 Hx thyroid  ca s/p radiation, thyroidectomy   Renal/GU negative Renal ROS Bladder dysfunction      Musculoskeletal  (+) Arthritis , Osteoarthritis,    Abdominal  (+) + obese  Peds  Hematology  (+) Blood dyscrasia Diffuse large B cell lymphoma  Hb 12.4   Anesthesia Other Findings Mounjaro  LD: 5/4  Reproductive/Obstetrics negative OB ROS                             Anesthesia Physical Anesthesia Plan  ASA: 2  Anesthesia Plan: General   Post-op Pain Management: Tylenol  PO (pre-op)*   Induction: Intravenous  PONV Risk Score and Plan: 3 and Ondansetron , Dexamethasone , Midazolam  and Treatment may vary due to age or medical condition  Airway Management Planned: Oral ETT  Additional Equipment: None  Intra-op Plan:   Post-operative Plan: Extubation in OR  Informed Consent: I have reviewed the patients History and Physical, chart, labs and discussed the procedure including the risks, benefits and alternatives for the proposed anesthesia with the patient or authorized representative who has indicated his/her understanding and acceptance.     Dental advisory given  Plan Discussed with: CRNA  Anesthesia Plan Comments:         Anesthesia Quick Evaluation

## 2024-04-24 NOTE — Transfer of Care (Signed)
 Immediate Anesthesia Transfer of Care Note  Patient: Maria Simmons  Procedure(s) Performed: REMOVAL, GASTRIC BAND, LAPAROSCOPIC LAPAROSCOPY, DIAGNOSTIC  Patient Location: PACU  Anesthesia Type:General  Level of Consciousness: drowsy  Airway & Oxygen  Therapy: Patient Spontanous Breathing and Patient connected to face mask oxygen   Post-op Assessment: Report given to RN and Post -op Vital signs reviewed and stable  Post vital signs: Reviewed and stable  Last Vitals:  Vitals Value Taken Time  BP 165/85 04/24/24 1138  Temp    Pulse 87 04/24/24 1140  Resp 20 04/24/24 1140  SpO2 100 % 04/24/24 1140  Vitals shown include unfiled device data.  Last Pain:  Vitals:   04/24/24 0836  TempSrc:   PainSc: 0-No pain         Complications: No notable events documented.

## 2024-04-24 NOTE — Discharge Instructions (Signed)
 LAPAROSCOPIC SURGERY: POST OP INSTRUCTIONS   EAT Gradually transition to a high fiber diet with a fiber supplement over the next few weeks after discharge.  Start with a pureed / full liquid diet (see below)  WALK Walk an hour a day (cumulative- not all at once).  Control your pain to do that.    CONTROL PAIN Control pain so that you can walk, sleep, tolerate sneezing/coughing, go up/down stairs.  HAVE A BOWEL MOVEMENT DAILY Keep your bowels regular to avoid problems.  OK to try a laxative to override constipation.  OK to use an antidiarrheal to slow down diarrhea.  Call if not better after 2 tries  CALL IF YOU HAVE PROBLEMS/CONCERNS Call if you are still struggling despite following these instructions. Call if you have concerns not answered by these instructions    DIET: Follow a light bland diet & liquids the first 24 hours after arrival home, such as soup, liquids, starches, etc.  Be sure to drink plenty of fluids.  Quickly advance to a usual solid diet within a few days.  Avoid fast food or heavy meals initially as you are more likely to get nauseated or have irregular bowels.  A low-sugar, high protein/high-fiber diet for the rest of your life is ideal.  Take your usually prescribed home medications unless otherwise directed.  PAIN CONTROL: Pain is best controlled by a usual combination of three different methods TOGETHER: Ice/Heat Over the counter pain medication Prescription pain medication Most patients will experience some swelling and bruising around the incisions.  Ice packs or heating pads (30-60 minutes up to 6 times a day) will help. Use ice for the first few days to help decrease swelling and bruising, then switch to heat to help relax tight/sore spots and speed recovery.  Some people prefer to use ice alone, heat alone, alternating between ice & heat.  Experiment to what works for you.  Swelling and bruising can take several weeks to resolve.   It is helpful to take an  over-the-counter pain medication regularly for the first few days: Naproxen (Aleve, etc)  Two 220mg  tabs twice a day OR Ibuprofen (Advil, etc) Three 200mg  tabs four times a day (every meal & bedtime) AND Acetaminophen  (Tylenol , etc) 500-650mg  four times a day (every meal & bedtime) A  prescription for pain medication (such as oxycodone , hydrocodone , tramadol, gabapentin , methocarbamol , etc) should be given to you upon discharge.  Take your pain medication as prescribed, IF NEEDED.  If you are having problems/concerns with the prescription medicine (does not control pain, nausea, vomiting, rash, itching, etc), please call us  [phone] to see if we need to switch you to a different pain medicine that will work better for you and/or control your side effect better. If you need a refill on your pain medication, please give us  48 hour notice.  contact your pharmacy.  They will contact our office to request authorization. Prescriptions will not be filled after 5 pm or on week-ends  Avoid getting constipated.   Between the surgery and the pain medications, it is common to experience some constipation.   Increasing fluid intake and taking a fiber supplement (such as Metamucil, Citrucel, FiberCon, MiraLax , etc) 1-2 times a day regularly will usually help prevent this problem from occurring.   A mild laxative (prune juice, Milk of Magnesia, MiraLax , etc) should be taken according to package directions if there are no bowel movements after 48 hours.   Watch out for diarrhea.   If you have  many loose bowel movements, simplify your diet to bland foods & liquids for a few days.   Stop any stool softeners and decrease your fiber supplement.   Switching to mild anti-diarrheal medications (Kayopectate, Pepto Bismol) can help.   If this worsens or does not improve, please call us .  Wash / shower every day, starting 2 days after surgery.  You may shower over the steri strips which are waterproof.  Do not soak or  submerge incisions.  No rubbing, scrubbing, lotions or ointments to incisions.  Remove Band-Aids/outer dressings in 2 days.  Steri-Strips will peel off on their own after 1 to 2 weeks.  You may leave the incision open to air.  You may replace a dressing/Band-Aid to cover the incisions for comfort if you wish.   ACTIVITIES as tolerated:   You may resume regular (light) daily activities beginning the next day--such as daily self-care, walking, climbing stairs--gradually increasing activities as tolerated.  If you can walk 30 minutes without difficulty, it is safe to try more intense activity such as jogging, treadmill, bicycling, low-impact aerobics, swimming, etc. Save the most intensive and strenuous activity for last such as sit-ups, heavy lifting, contact sports, etc  Refrain from any heavy lifting or straining until you are off narcotics for pain control.   DO NOT PUSH THROUGH PAIN.  Let pain be your guide: If it hurts to do something, don't do it.  Pain is your body warning you to avoid that activity for another week until the pain goes down. You may drive when you are no longer taking prescription pain medication, you can comfortably wear a seatbelt, and you can safely maneuver your car and apply brakes. You may have sexual intercourse when it is comfortable.  FOLLOW UP in our office Please call [facility] at [phone] to set up an appointment to see your surgeon in the office for a follow-up appointment approximately 2-3 weeks after your surgery. Make sure that you call for this appointment the day you arrive home to insure a convenient appointment time.  10. IF YOU HAVE DISABILITY OR FAMILY LEAVE FORMS, BRING THEM TO THE OFFICE FOR PROCESSING.  DO NOT GIVE THEM TO YOUR DOCTOR.   WHEN TO CALL US  [phone]: Poor pain control Reactions / problems with new medications (rash/itching, nausea, etc)  Fever over 101.5 F (38.5 C) Inability to urinate Nausea and/or vomiting Worsening swelling  or bruising Continued bleeding from incision. Increased pain, redness, or drainage from the incision   The clinic staff is available to answer your questions during regular business hours (8:30am-5pm).  Please don't hesitate to call and ask to speak to one of our nurses for clinical concerns.   If you have a medical emergency, go to the nearest emergency room or call 911.  A surgeon from [facility] is always on call at the [facility], [place] [address], [address], [place], [place]  [address] ? MAIN: [phone] ? TOLL FREE: [phone] ?  FAX [phone] [email or web address]

## 2024-04-24 NOTE — H&P (Signed)
 Maria Simmons [id number]   Referring Provider:  Royanne Foots, Obgyn   Subjective   Chief Complaint: NEW PROBLEM     History of Present Illness:    Very pleasant 58 year old woman with a history of thyroid cancer as well as lymphoma who presents for evaluation of abdominal pain and Lap-Band removal.  She has been experiencing left sided abdominal pain which seems to radiate throughout the left side of the abdomen and is more concentrated in the lower quadrants.  She had also been having rectal and vaginal bleeding despite being status post hysterectomy and reports that her gynecologist, Dr. Cherly Hensen, had her undergo a transvaginal and transabdominal pelvic ultrasound.  Reportedly, there was a 3 x 4 cm mass identified, the ultrasound report states "bump like area noted in lower abdomen, difficult to interpret??-4.31 x 3.41 cm" but does not describe the location whether it is intra-abdominal or in the abdominal wall.  The patient states that the physician reported that she was able to palpate this on abdominal exam.  Subsequently she was seen by 2 of my partners last month; a CT scan has subsequently been performed which I have reviewed personally including the images and the report.  This describes a small amount of fat stranding anterior to loops of bowel in the left hemipelvis nonspecific but may reflect sequela of inflammation or infection, diverticulosis, as well as a focus of soft tissue nodularity/stranding in the left anterior abdominal wall most commonly reflect sequelae of injections (patient states she does not do any injections in her abdomen recently although she has in the past).  She also had a colonoscopy in November, she had a few precancerous polyps and will follow-up with Dr. Bosie Clos.   She would like her Lap-Band removed, she is not using it and has not had it adjusted in quite some time and in fact has had all the fluid removed due to issues with dysphagia in the past.  Given  her history of cancer, she is understandably anxious to find out the source of her abdominal pain and would like all foreign bodies removed from her body at this time.  Review of Systems: A complete review of systems was obtained from the patient.  I have reviewed this information and discussed as appropriate with the patient.  See HPI as well for other ROS.   Medical History: Past Medical History:  Diagnosis Date   Glucose intolerance (impaired glucose tolerance)    Hypothyroid    Thyroid cancer (CMS/HHS-HCC)     Patient Active Problem List  Diagnosis   DLBCL (diffuse large B cell lymphoma) (CMS/HHS-HCC)   On antineoplastic chemotherapy   Hx of radiation therapy   Cancer of thyroid (CMS/HHS-HCC)    Past Surgical History:  Procedure Laterality Date   ABDOMINAL HYSTERECTOMY  12/14/1998   BARIATRIC SURGERY  05/28/2009   laparoscopic gastric banding   NEEDLE BIOPSY LYMPH NODE CERVICLE Left 10/12/2014   Portacath placement  10/24/2014   Port-a-cath removal  10/18/2015   ARTHROPLASTY HIP TOTAL Left 11/12/2017   LAPAROSCOPIC REPAIR OF PARAVAGINAL DEFECT (INCLUDING REPAIR OF CYSTOCELE, IF PERFORMED)  05/03/2018   ARTHROPLASTY HIP TOTAL Right 09/15/2019   LAPAROSCOPIC TUBAL LIGATION     REDUCTION MAMMAPLASTY     THYROIDECTOMY       Allergies  Allergen Reactions   Adhesive Tape-Silicones Itching    Surgical tape.   Other Rash    Fingers turned  Blue with metformin   Levofloxacin Rash   Penicillins Rash  Current Outpatient Medications on File Prior to Visit  Medication Sig Dispense Refill   aspirin 81 MG EC tablet Take by mouth once daily.      MOUNJARO 12.5 mg/0.5 mL pen injector Inject 12.5 mg subcutaneously     allopurinol (ZYLOPRIM) 300 MG tablet once daily.   3   biotin 5 mg Tab Take by mouth once daily.      cholecalciferol (VITAMIN D3) 1,000 unit tablet Take by mouth once daily.      cyanocobalamin (VITAMIN B12) 1000 MCG tablet Take 1,000 mcg by mouth once daily.      HYDROcodone-acetaminophen (NORCO) 5-325 mg tablet every 4 (four) hours as needed.   0   lidocaine-prilocaine (EMLA) cream as needed.   3   loperamide (IMODIUM) 1 mg/5 mL solution Take by mouth 4 (four) times daily as needed.      LORazepam (ATIVAN) 0.5 MG tablet 2 (two) times daily as needed.   0   multivitamin with iron-minerals (VITAMINS & MINERALS) tablet Take by mouth once daily.      ondansetron (ZOFRAN) 8 MG tablet every 12 (twelve) hours as needed.   1   predniSONE (DELTASONE) 20 MG tablet once daily. 100 mg for 5 days after chemo  2   prochlorperazine (COMPAZINE) 10 MG tablet every 6 (six) hours as needed.   6   SYNTHROID 150 mcg tablet once daily.   6   No current facility-administered medications on file prior to visit.    Family History  Problem Relation Age of Onset   Colon cancer Mother    Diabetes Father    Stroke Father    High blood pressure (Hypertension) Father    Hyperlipidemia (Elevated cholesterol) Father    Prostate cancer Father    Peripheral vascular disease Father    Kidney disease Father    Colon polyps Maternal Uncle    COPD Maternal Grandfather    Uterine cancer Paternal Grandmother      Social History   Tobacco Use  Smoking Status Never  Smokeless Tobacco Never     Social History   Socioeconomic History   Marital status: Single  Tobacco Use   Smoking status: Never   Smokeless tobacco: Never  Vaping Use   Vaping status: Never Used  Substance and Sexual Activity   Alcohol use: No   Drug use: No  Social History Narrative   Works for a Program[employer], multimedia doing billing, Psychologist, occupational.   Lives with her grown son and daughters   Social Drivers of Corporate investment banker Strain: Low Risk  (11/15/2023)   Received from [facility]   Overall Financial Resource Strain (CARDIA)    Difficulty of Paying Living Expenses: Not very hard  Food Insecurity: No Food Insecurity (11/15/2023)   Received from [facility]   Hunger Vital Sign    Worried  About Running Out of Food in the Last Year: Never true    Ran Out of Food in the Last Year: Never true  Transportation Needs: No Transportation Needs (11/15/2023)   Received from [facility] - Transportation    Lack of Transportation (Medical): No    Lack of Transportation (Non-Medical): No  Physical Activity: Insufficiently Active (11/15/2023)   Received from [facility]   Exercise Vital Sign    Days of Exercise per Week: 3 days    Minutes of Exercise per Session: 20 min  Stress: No Stress Concern Present (11/15/2023)   Received from [facility]  [employer] of Occupational Health - Occupational Stress Questionnaire    Feeling of Stress : Not at all  Social Connections: Moderately Isolated (11/15/2023)   Received from [facility]   Social Connection and Isolation Panel [NHANES]    Frequency of Communication with Friends and Family: More than three times a week    Frequency of Social Gatherings with Friends and Family: Three times a week    Attends Religious Services: More than 4 times per year    Active Member of Clubs or Organizations: No    Marital Status: Divorced  Housing Stability: Unknown (02/24/2024)   Housing Stability Vital Sign    Homeless in the Last Year: No    Objective:    Vitals:   03/17/24 0959  PainSc: 0-No pain    There is no height or weight on file to calculate BMI.  Gen: A&Ox3, no distress  Chest: respiratory effort is normal. Abdomen: soft, nondistended, tender in the left lower quadrant without palpable mass or abnormality Neuro: no gross deficit Psych: appropriate mood and affect, normal insight/judgment intact  Skin: warm and dry   Labs, Imaging and Diagnostic Testing: CT as above  Assessment and Plan:  Diagnoses and all orders for this visit:  Left lower quadrant abdominal pain  LAP-BAND surgery status    Will plan for laparoscopic removal of Lap-Band with concomitant diagnostic laparoscopy focused on the left lower  quadrant.  I discussed the CT report and imaging with her, wonder if the haziness noted in the fat tissue may reflect omental infarct which could certainly cause acute and subsequently chronic pain in this area but we will take a look at everything that we are able to grossly to see if we can identify any other source of her abdominal pain.  We discussed the procedure and reviewed risks including bleeding, infection, pain, scarring, injury to intra-abdominal structures, need for further surgery or intervention, as well as general cardiovascular/pulmonary/thromboembolic complications.  Questions were welcomed and answered to her satisfaction, she wishes to proceed.   Phylliss Blakes MD FACS

## 2024-04-25 ENCOUNTER — Encounter (HOSPITAL_COMMUNITY): Payer: Self-pay | Admitting: Surgery

## 2024-05-04 DIAGNOSIS — C73 Malignant neoplasm of thyroid gland: Secondary | ICD-10-CM | POA: Diagnosis not present

## 2024-05-04 DIAGNOSIS — E559 Vitamin D deficiency, unspecified: Secondary | ICD-10-CM | POA: Diagnosis not present

## 2024-05-04 DIAGNOSIS — E1165 Type 2 diabetes mellitus with hyperglycemia: Secondary | ICD-10-CM | POA: Diagnosis not present

## 2024-05-04 DIAGNOSIS — E890 Postprocedural hypothyroidism: Secondary | ICD-10-CM | POA: Diagnosis not present

## 2024-05-04 LAB — TSH: TSH: 0.01 — AB (ref 0.41–5.90)

## 2024-05-04 LAB — HEPATIC FUNCTION PANEL
ALT: 15 U/L (ref 7–35)
AST: 15 (ref 13–35)
Alkaline Phosphatase: 83 (ref 25–125)

## 2024-05-04 LAB — BASIC METABOLIC PANEL WITH GFR
BUN: 11 (ref 4–21)
CO2: 22 (ref 13–22)
Chloride: 100 (ref 99–108)
Creatinine: 0.8 (ref 0.5–1.1)
Glucose: 90
Potassium: 4.5 meq/L (ref 3.5–5.1)
Sodium: 139 (ref 137–147)

## 2024-05-04 LAB — COMPREHENSIVE METABOLIC PANEL WITH GFR
Albumin: 3.9 (ref 3.5–5.0)
Calcium: 9.2 (ref 8.7–10.7)
Globulin: 2.7
eGFR: 81

## 2024-05-04 LAB — VITAMIN D 25 HYDROXY (VIT D DEFICIENCY, FRACTURES): Vit D, 25-Hydroxy: 38.7

## 2024-05-04 LAB — HEMOGLOBIN A1C: Hemoglobin A1C: 6.1

## 2024-05-11 ENCOUNTER — Other Ambulatory Visit (HOSPITAL_COMMUNITY): Payer: Self-pay

## 2024-05-11 DIAGNOSIS — C73 Malignant neoplasm of thyroid gland: Secondary | ICD-10-CM | POA: Diagnosis not present

## 2024-05-11 DIAGNOSIS — E890 Postprocedural hypothyroidism: Secondary | ICD-10-CM | POA: Diagnosis not present

## 2024-05-11 DIAGNOSIS — E1165 Type 2 diabetes mellitus with hyperglycemia: Secondary | ICD-10-CM | POA: Diagnosis not present

## 2024-05-11 DIAGNOSIS — I10 Essential (primary) hypertension: Secondary | ICD-10-CM | POA: Diagnosis not present

## 2024-05-11 MED ORDER — SYNTHROID 125 MCG PO TABS
125.0000 ug | ORAL_TABLET | Freq: Every morning | ORAL | 5 refills | Status: DC
  Filled 2024-05-11: qty 30, 30d supply, fill #0
  Filled 2024-06-12: qty 30, 30d supply, fill #1
  Filled 2024-07-12: qty 30, 30d supply, fill #2
  Filled 2024-08-10: qty 30, 30d supply, fill #3
  Filled 2024-09-09: qty 30, 30d supply, fill #4
  Filled 2024-10-11: qty 30, 30d supply, fill #5

## 2024-05-11 NOTE — Progress Notes (Signed)
   Maria Simmons [id number]  DATE OF ENCOUNTER: 05/11/2024 Interval History:   She is about 2 and half weeks status post the below procedure.  She did call about a week after surgery noting pruritic rash around her incisions and across the lower abdomen from hip to hip which did improve with antihistamines.  Reports her preoperative left-sided pain is resolved.  Physical Examination:   There were no vitals filed for this visit.  Alert, well-appearing Unlabored respirations Abdomen is soft, nontender.  Incisions healing well.  The port site does have some superficial eschar present where it may have blistered from the dermatitis but no evidence of infection, hematoma, seroma or other complicating feature.   Assessment and Plan:   Recovering appropriately.  Reviewed intraoperative findings.  Okay to use Aquaphor or Vaseline on incisions to help with healing.  Resume normal activities and normal diet.  Discussed expectations for ongoing recovery, activity limitations if applicable, and reasons to call.  04/24/24: Procedure performed: Diagnostic laparoscopy, upper scopic lysis of adhesions x 50 minutes, removal of lap band   Preop diagnosis: Lap-Band status, left-sided abdominal pain Post-op diagnosis/intraop findings: Dense omental adhesions along the anterior abdominal wall in the region of pain in the left hemiabdomen; disrupted Lap-Band tubing at the level of the titanium connector   Mitzie Freund, MD FACS

## 2024-05-15 ENCOUNTER — Other Ambulatory Visit (HOSPITAL_COMMUNITY): Payer: Self-pay

## 2024-05-18 ENCOUNTER — Other Ambulatory Visit (HOSPITAL_COMMUNITY): Payer: Self-pay

## 2024-06-12 ENCOUNTER — Ambulatory Visit (HOSPITAL_COMMUNITY)
Admission: RE | Admit: 2024-06-12 | Discharge: 2024-06-12 | Disposition: A | Discharge status: Home / Self Care | Source: Ambulatory Visit | Attending: Endocrinology | Admitting: Endocrinology

## 2024-06-12 ENCOUNTER — Other Ambulatory Visit (HOSPITAL_COMMUNITY): Payer: Self-pay

## 2024-06-12 ENCOUNTER — Other Ambulatory Visit: Payer: Commercial Managed Care - PPO

## 2024-06-12 ENCOUNTER — Other Ambulatory Visit: Payer: Self-pay

## 2024-06-12 DIAGNOSIS — E8940 Asymptomatic postprocedural ovarian failure: Secondary | ICD-10-CM | POA: Diagnosis not present

## 2024-06-12 MED ORDER — MOUNJARO 12.5 MG/0.5ML ~~LOC~~ SOAJ
12.5000 mg | SUBCUTANEOUS | 6 refills | Status: AC
  Filled 2024-06-12: qty 2, 28d supply, fill #0
  Filled 2024-07-12: qty 2, 28d supply, fill #1
  Filled 2024-08-10: qty 2, 28d supply, fill #2
  Filled 2024-09-09: qty 2, 28d supply, fill #3
  Filled 2024-10-11: qty 2, 28d supply, fill #4
  Filled 2024-11-06: qty 2, 28d supply, fill #5
  Filled 2024-12-20: qty 2, 28d supply, fill #6

## 2024-06-15 ENCOUNTER — Other Ambulatory Visit (HOSPITAL_COMMUNITY): Payer: Self-pay

## 2024-06-15 ENCOUNTER — Encounter: Payer: Self-pay | Admitting: Nurse Practitioner

## 2024-06-15 ENCOUNTER — Ambulatory Visit: Admitting: Nurse Practitioner

## 2024-06-15 VITALS — BP 120/80 | HR 95 | Temp 98.5°F | Ht 64.0 in | Wt 201.0 lb

## 2024-06-15 DIAGNOSIS — Z0000 Encounter for general adult medical examination without abnormal findings: Secondary | ICD-10-CM

## 2024-06-15 DIAGNOSIS — E039 Hypothyroidism, unspecified: Secondary | ICD-10-CM | POA: Diagnosis not present

## 2024-06-15 DIAGNOSIS — R413 Other amnesia: Secondary | ICD-10-CM | POA: Diagnosis not present

## 2024-06-15 DIAGNOSIS — D519 Vitamin B12 deficiency anemia, unspecified: Secondary | ICD-10-CM | POA: Diagnosis not present

## 2024-06-15 DIAGNOSIS — C73 Malignant neoplasm of thyroid gland: Secondary | ICD-10-CM

## 2024-06-15 DIAGNOSIS — Z2821 Immunization not carried out because of patient refusal: Secondary | ICD-10-CM

## 2024-06-15 DIAGNOSIS — E7849 Other hyperlipidemia: Secondary | ICD-10-CM | POA: Diagnosis not present

## 2024-06-15 DIAGNOSIS — Z79899 Other long term (current) drug therapy: Secondary | ICD-10-CM

## 2024-06-15 MED ORDER — ATORVASTATIN CALCIUM 40 MG PO TABS
40.0000 mg | ORAL_TABLET | Freq: Every day | ORAL | 1 refills | Status: DC
  Filled 2024-06-15: qty 90, 90d supply, fill #0
  Filled 2024-07-12 – 2024-09-09 (×3): qty 90, 90d supply, fill #1

## 2024-06-15 NOTE — Patient Instructions (Signed)
 Make sure you are following good sleep hygiene methods such as taking away screens 1-2 hours before sleep, making the room dark, and supplements such a magnesium  to help you get optimal rest.

## 2024-06-15 NOTE — Progress Notes (Signed)
 LILLETTE Kristeen JINNY Gladis, CMA,acting as a Neurosurgeon for Maria Ada, FNP.,have documented all relevant documentation on the behalf of Maria Ada, FNP,as directed by  Maria Ada, FNP while in the presence of Maria Ada, FNP.  Subjective:  Patient ID: Maria Simmons , female    DOB: 03-Jul-1966 , 58 y.o.   MRN: [id number]  Chief Complaint  Patient presents with   Annual Exam    Patient presents today for HM, Patient reports compliance with medication. Patient denies any chest pain, SOB, or headaches.    Fatigue    Patient reports she had a stroke in 2022 and since she has a really hard time staying focus and she gets really tired. She works as Patient Therapist, art. She would like to get intermitting FMLA.     HPI  Patient presents for annual wellness exam.  Endorses memory, concentration issues. Family history of Alzheimer's (grandfather).  States that since she had a TIA in 2022, she has had memory issues.  This is now affecting her work as she has to meet production for her job.  States that she has had more headaches over the past  3-4 months.    History of thyroid  cancer, on synthroid  that is being weight adjusted by Dr Tommas.  Lap band removed due to deterioration, no abdominal pain, nausea or vomiting.  History of rectal bleeding and hemmrroids    No further vaginal or rectal bleeding since seeing Dr Rutherford with GYN.  Endorses pain intermittently in right hip, at times has sensation of pin and needles.    Follow a diet of fish and chicken for protein.  Incorporates green leafy veggies.  Drinks 32 or more ounces of water  a day.  Not consistently exercising, plays outside with grandson in yard at least 3-4 times a week.  Sleep is not consistent, sleeping 4-5 hours a night.  History of insomnia but states that it is improved.  Patient states that she does not snore.        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 , papillary   Change in bowel habits 03/19/2016   Constipation    Diabetes mellitus without complication (HCC)    DLBCL (diffuse large B cell lymphoma) (HCC)    History of B-cell lymphoma 11/18/2015   Hypothyroidism    Neutropenic fever (HCC) 11/01/2014   PONV (postoperative nausea and vomiting)    during hysterectomy    S/P radiation therapy 02/05/15-02/27/15   tonsil/neck,lt subpectoral nodal bed 30.6Gy /17 fx   Stroke [facility])    TIA 05/2021   Thyroid  cancer (HCC)    Wears glasses      Family History  Problem Relation Age of Onset   Colon cancer Mother    Cancer Mother        colon ca   Early death Mother    Cancer Father        prostate   Hypertension Father    Hyperlipidemia Father    Stroke Father    Prostate cancer Father    Diabetes Father    Heart disease Father    Migraines Brother    Cerebral aneurysm Maternal Grandmother    Heart disease Maternal Grandmother    Heart attack Maternal Grandmother    Cancer Paternal Grandmother        female organ ca  Alzheimer's disease Paternal Grandfather      Current Outpatient Medications:    aspirin  81 MG EC tablet, Take 1 tablet (81 mg total) by mouth daily with breakfast. Swallow whole. (Patient taking differently: Take 81 mg by mouth once a week. Swallow whole.), Disp: 30 tablet, Rfl: 11   SYNTHROID  125 MCG tablet, Take 1 tablet (125 mcg total) by mouth every morning on an empty stomach., Disp: 30 tablet, Rfl: 5   tirzepatide  (MOUNJARO ) 12.5 MG/0.5ML Pen, Inject 12.5 mg into the skin once a week., Disp: 2 mL, Rfl: 6   Vitamin D , Ergocalciferol , (DRISDOL ) 1.25 MG (50000 UNIT) CAPS capsule, Take 1 capsule (50,000 Units total) by mouth once a week., Disp: 12 capsule, Rfl: 5   atorvastatin  (LIPITOR) 40 MG tablet, Take 1 tablet (40 mg total) by mouth daily., Disp: 90 tablet, Rfl: 1   Allergies  Allergen Reactions   Metformin And Related Rash    Fingers  turned  Blue.   Tape Itching    Surgical tape.   Levaquin  Amel.Azure ] Rash   Penicillins Rash    Has patient had a PCN reaction causing immediate rash, facial/tongue/throat swelling, SOB or lightheadedness with hypotension: No Has patient had a PCN reaction causing severe rash involving mucus membranes or skin necrosis: No Has patient had a PCN reaction that required hospitalization No Has patient had a PCN reaction occurring within the last 10 years: No If all of the above answers are NO, then may proceed with Cephalosporin use.      Review of Systems  Constitutional:  Positive for fatigue. Negative for chills and fever.  Eyes:  Negative for visual disturbance.  Respiratory:  Negative for cough, shortness of breath and wheezing.   Cardiovascular:  Negative for chest pain, palpitations and leg swelling.  Gastrointestinal:  Negative for abdominal distention, abdominal pain, constipation, diarrhea, nausea and vomiting.  Endocrine: Negative for polydipsia, polyphagia and polyuria.  Genitourinary:  Negative for decreased urine volume, dyspareunia, dysuria, urgency, vaginal bleeding, vaginal discharge and vaginal pain.  Musculoskeletal:  Negative for arthralgias, back pain, myalgias, neck pain and neck stiffness.  Skin:  Negative for rash and wound.  Neurological:  Negative for dizziness, tremors, weakness, light-headedness and headaches.  Psychiatric/Behavioral:  Positive for decreased concentration and sleep disturbance.      Today's Vitals   06/15/24 1508  BP: 120/80  Pulse: 95  Temp: 98.5 F (36.9 C)  TempSrc: Oral  Weight: 201 lb (91.2 kg)  Height: 5' 4 (1.626 m)  PainSc: 0-No pain   Body mass index is 34.5 kg/m.  Wt Readings from Last 3 Encounters:  06/23/24 201 lb (91.2 kg)  06/15/24 201 lb (91.2 kg)  04/17/24 186 lb (84.4 kg)      Objective:  Physical Exam Exam conducted with a chaperone present.  Constitutional:      General: She is not in acute  distress.    Appearance: She is obese.  HENT:     Head: Normocephalic and atraumatic.     Right Ear: External ear normal.     Left Ear: External ear normal.     Nose: Nose normal.     Mouth/Throat:     Mouth: Mucous membranes are moist.     Pharynx: Oropharynx is clear.  Eyes:     Pupils: Pupils are equal, round, and reactive to light.  Neck:     Vascular: No carotid bruit.  Cardiovascular:     Rate and Rhythm: Normal rate and regular rhythm.  Pulses: Normal pulses.     Heart sounds: Normal heart sounds. No murmur heard. Pulmonary:     Effort: Pulmonary effort is normal.     Breath sounds: Normal breath sounds. No wheezing or rhonchi.  Abdominal:     General: Bowel sounds are normal. There is no distension.     Palpations: Abdomen is soft. There is no mass.     Tenderness: There is no abdominal tenderness. There is no guarding.  Genitourinary:    Comments: Deferred, followed by Dr. Rutherford Musculoskeletal:        General: No swelling or tenderness.     Cervical back: Normal range of motion.     Right lower leg: No edema.     Left lower leg: No edema.  Lymphadenopathy:     Cervical: No cervical adenopathy.  Skin:    General: Skin is warm and dry.     Capillary Refill: Capillary refill takes less than 2 seconds.  Neurological:     General: No focal deficit present.     Mental Status: She is alert and oriented to person, place, and time.     Sensory: No sensory deficit.     Motor: No weakness.     Coordination: Coordination normal.     Gait: Gait normal.  Psychiatric:        Attention and Perception: Attention and perception normal.        Mood and Affect: Mood and affect normal.        Speech: Speech normal.        Behavior: Behavior normal.        Thought Content: Thought content normal.        Judgment: Judgment normal.     Comments: Slow recall         Assessment And Plan:  Encounter for annual health examination Assessment & Plan: Behavior modifications  discussed and diet history reviewed.   Pt will continue to exercise regularly and modify diet with low GI, plant based foods and decrease intake of processed foods.  Recommend intake of daily multivitamin, Vitamin D , and calcium .  Recommend mammogram and colonoscopy for preventive screenings, as well as recommend immunizations that include influenza, TDAP, and Shingles    Hypothyroidism, unspecified type Assessment & Plan: Will check thyroid  levels. Continue current medications   Anemia due to vitamin B12 deficiency, unspecified B12 deficiency type Assessment & Plan: Will recheck vitamin B12   Orders: -     CBC with Differential/Platelet -     Iron, TIBC and Ferritin Panel -     Vitamin B12  COVID-19 vaccination declined Assessment & Plan: Declines covid 19 vaccine. Discussed risk of covid 30 and if she changes her mind about the vaccine to call the office. Education has been provided regarding the importance of this vaccine but patient still declined. Advised may receive this vaccine at local pharmacy or Health Dept.or vaccine clinic. Aware to provide a copy of the vaccination record if obtained from local pharmacy or Health Dept.  Encouraged to take multivitamin, vitamin d , vitamin c and zinc to increase immune system. Aware can call office if would like to have vaccine here at office. Verbalized acceptance and understanding.    Herpes zoster vaccination declined Assessment & Plan: Declines shingrix, educated on disease process and is aware if he changes his mind to notify office    Cancer of thyroid  (HCC)  Memory change Assessment & Plan: Patient endorses memory changes, difficulty with recall during patient interview.  Vitamin  B12 and T palladium screening ordered.  TSH 0.013 on 05/04/24 ordered and followed by Dr Tommas.  Will refer to Dr Marily with neurology if lab results are normal.    Orders: -     Vitamin B12 -     T pallidum Screening [facility]  Other  hyperlipidemia -     Lipid panel -     Atorvastatin  Calcium ; Take 1 tablet (40 mg total) by mouth daily.  Dispense: 90 tablet; Refill: 1  Other long term (current) drug therapy -     CBC with Differential/Platelet    Return for 1 year physical.  Patient was given opportunity to ask questions. Patient verbalized understanding of the plan and was able to repeat key elements of the plan. All questions were answered to their satisfaction.    LILLETTE Maria Ada, FNP, have reviewed all documentation for this visit. The documentation on 06/15/24 for the exam, diagnosis, procedures, and orders are all accurate and complete.   IF YOU HAVE BEEN REFERRED TO A SPECIALIST, IT MAY TAKE 1-2 WEEKS TO SCHEDULE/PROCESS THE REFERRAL. IF YOU HAVE NOT HEARD FROM US /SPECIALIST IN TWO WEEKS, PLEASE GIVE US  A CALL AT [phone] X [phone].

## 2024-06-18 LAB — CBC WITH DIFFERENTIAL/PLATELET
Basophils Absolute: 0 x10E3/uL (ref 0.0–0.2)
Basos: 1 %
EOS (ABSOLUTE): 0.1 x10E3/uL (ref 0.0–0.4)
Eos: 2 %
Hematocrit: 38.9 % (ref 34.0–46.6)
Hemoglobin: 12.2 g/dL (ref 11.1–15.9)
Immature Grans (Abs): 0 x10E3/uL (ref 0.0–0.1)
Immature Granulocytes: 0 %
Lymphocytes Absolute: 2.1 x10E3/uL (ref 0.7–3.1)
Lymphs: 34 %
MCH: 27.7 pg (ref 26.6–33.0)
MCHC: 31.4 g/dL — ABNORMAL LOW (ref 31.5–35.7)
MCV: 88 fL (ref 79–97)
Monocytes Absolute: 0.5 x10E3/uL (ref 0.1–0.9)
Monocytes: 8 %
Neutrophils Absolute: 3.4 x10E3/uL (ref 1.4–7.0)
Neutrophils: 54 %
Platelets: 345 x10E3/uL (ref 150–450)
RBC: 4.4 x10E6/uL (ref 3.77–5.28)
RDW: 13.9 % (ref 11.7–15.4)
WBC: 6.1 x10E3/uL (ref 3.4–10.8)

## 2024-06-18 LAB — IRON,TIBC AND FERRITIN PANEL
Ferritin: 76 ng/mL (ref 15–150)
Iron Saturation: 23 % (ref 15–55)
Iron: 82 ug/dL (ref 27–159)
Total Iron Binding Capacity: 353 ug/dL (ref 250–450)
UIBC: 271 ug/dL (ref 131–425)

## 2024-06-18 LAB — LIPID PANEL
Chol/HDL Ratio: 3.7 ratio (ref 0.0–4.4)
Cholesterol, Total: 214 mg/dL — ABNORMAL HIGH (ref 100–199)
HDL: 58 mg/dL (ref >39)
LDL Chol Calc (NIH): 134 mg/dL — ABNORMAL HIGH (ref 0–99)
Triglycerides: 125 mg/dL (ref 0–149)
VLDL Cholesterol Cal: 22 mg/dL (ref 5–40)

## 2024-06-18 LAB — VITAMIN B12: Vitamin B-12: 450 pg/mL (ref 232–1245)

## 2024-06-18 LAB — T PALLIDUM SCREENING CASCADE: T pallidum Antibodies (TP-PA): NONREACTIVE

## 2024-06-19 ENCOUNTER — Ambulatory Visit: Payer: Self-pay | Admitting: Nurse Practitioner

## 2024-06-20 ENCOUNTER — Ambulatory Visit: Payer: Self-pay

## 2024-06-20 ENCOUNTER — Encounter: Payer: Self-pay | Admitting: Nurse Practitioner

## 2024-06-23 ENCOUNTER — Ambulatory Visit (INDEPENDENT_AMBULATORY_CARE_PROVIDER_SITE_OTHER): Payer: Self-pay

## 2024-06-23 VITALS — BP 122/80 | HR 98 | Temp 98.5°F | Ht 64.0 in | Wt 201.0 lb

## 2024-06-23 DIAGNOSIS — D519 Vitamin B12 deficiency anemia, unspecified: Secondary | ICD-10-CM | POA: Diagnosis not present

## 2024-06-23 MED ORDER — CYANOCOBALAMIN 1000 MCG/ML IJ SOLN
1000.0000 ug | Freq: Once | INTRAMUSCULAR | Status: AC
  Administered 2024-06-23: 1000 ug via INTRAMUSCULAR

## 2024-06-23 NOTE — Progress Notes (Signed)
 Patient is in office today for a nurse visit for B12 Injection. Patient Injection was given in the  Left deltoid. Patient tolerated injection well.

## 2024-06-26 ENCOUNTER — Encounter: Payer: Self-pay | Admitting: Nurse Practitioner

## 2024-06-26 DIAGNOSIS — Z2821 Immunization not carried out because of patient refusal: Secondary | ICD-10-CM | POA: Insufficient documentation

## 2024-06-26 DIAGNOSIS — Z0000 Encounter for general adult medical examination without abnormal findings: Secondary | ICD-10-CM | POA: Insufficient documentation

## 2024-06-26 DIAGNOSIS — E7849 Other hyperlipidemia: Secondary | ICD-10-CM | POA: Insufficient documentation

## 2024-06-26 NOTE — Assessment & Plan Note (Signed)

## 2024-06-26 NOTE — Assessment & Plan Note (Signed)
 Patient endorses memory changes, difficulty with recall during patient interview.  Vitamin B12 and T palladium screening ordered.  TSH 0.013 on 05/04/24 ordered and followed by Dr Tommas.  Will refer to Dr Marily with neurology if lab results are normal.

## 2024-06-26 NOTE — Assessment & Plan Note (Signed)

## 2024-06-26 NOTE — Assessment & Plan Note (Signed)
 Declines shingrix, educated on disease process and is aware if he changes his mind to notify office

## 2024-06-26 NOTE — Assessment & Plan Note (Signed)
 Will recheck vitamin B12.

## 2024-06-26 NOTE — Assessment & Plan Note (Signed)
 Will check thyroid  levels. Continue current medications

## 2024-06-30 ENCOUNTER — Ambulatory Visit: Payer: Self-pay

## 2024-06-30 VITALS — BP 122/84 | HR 86 | Temp 98.1°F | Ht 64.0 in | Wt 201.0 lb

## 2024-06-30 DIAGNOSIS — D519 Vitamin B12 deficiency anemia, unspecified: Secondary | ICD-10-CM

## 2024-06-30 MED ORDER — CYANOCOBALAMIN 1000 MCG/ML IJ SOLN
1000.0000 ug | Freq: Once | INTRAMUSCULAR | Status: AC
Start: 2024-06-30 — End: 2024-06-30
  Administered 2024-06-30: 1000 ug via INTRAMUSCULAR

## 2024-06-30 NOTE — Patient Instructions (Signed)
 Vitamin B12 Deficiency Vitamin B12 deficiency means that your body does not have enough vitamin B12. The body needs this important vitamin: To make red blood cells. To make genes (DNA). To help the nerves work. If you do not have enough vitamin B12 in your body, you can have health problems, such as not having enough red blood cells in the blood (anemia). What are the causes? Not eating enough foods that contain vitamin B12. Not being able to take in (absorb) vitamin B12 from the food that you eat. Certain diseases. A condition in which the body does not make enough of a certain protein. This results in your body not taking in enough vitamin B12. Having a surgery in which part of the stomach or small intestine is taken out. Taking medicines that make it hard for the body to take in vitamin B12. These include: Heartburn medicines. Some medicines that are used to treat diabetes. What increases the risk? Being an older adult. Eating a vegetarian or vegan diet that does not include any foods that come from animals. Not eating enough foods that contain vitamin B12 while you are pregnant. Taking certain medicines. Having alcoholism. What are the signs or symptoms? In some cases, there are no symptoms. If the condition leads to too few blood cells or nerve damage, symptoms can occur, such as: Feeling weak or tired. Not being hungry. Losing feeling (numbness) or tingling in your hands and feet. Redness and burning of the tongue. Feeling sad (depressed). Confusion or memory problems. Trouble walking. If anemia is very bad, symptoms can include: Being short of breath. Being dizzy. Having a very fast heartbeat. How is this treated? Changing the way you eat and drink, such as: Eating more foods that contain vitamin B12. Drinking little or no alcohol. Getting vitamin B12 shots. Taking vitamin B12 supplements by mouth (orally). Your doctor will tell you the dose that is best for you. Follow  these instructions at home: Eating and drinking  Eat foods that come from animals and have a lot of vitamin B12 in them. These include: Meats and poultry. This includes beef, pork, chicken, [place], and organ meats, such as liver. Seafood, such as clams, rainbow trout, salmon, tuna, and haddock. Eggs. Dairy foods such as milk, yogurt, and cheese. Eat breakfast cereals that have vitamin B12 added to them (are fortified). Check the label. The items listed above may not be a complete list of foods and beverages you can eat and drink. Contact a dietitian for more information. Alcohol use Do not drink alcohol if: Your doctor tells you not to drink. You are pregnant, may be pregnant, or are planning to become pregnant. If you drink alcohol: Limit how much you have to: 0-1 drink a day for women. 0-2 drinks a day for men. Know how much alcohol is in your drink. In the [place] one drink equals one 12 oz bottle of beer (355 mL), one 5 oz glass of wine (148 mL), or one 1 oz glass of hard liquor (44 mL). General instructions Get any vitamin B12 shots if told by your doctor. Take supplements only as told by your doctor. Follow the directions. Keep all follow-up visits. Contact a doctor if: Your symptoms come back. Your symptoms get worse or do not get better with treatment. Get help right away if: You have trouble breathing. You have a very fast heartbeat. You have chest pain. You get dizzy. You faint. These symptoms may be an emergency. Get help right away. Call 911.  Do not wait to see if the symptoms will go away. Do not drive yourself to the hospital. Summary Vitamin B12 deficiency means that your body is not getting enough of the vitamin. In some cases, there are no symptoms of this condition. Treatment may include making a change in the way you eat and drink, getting shots, or taking supplements. Eat foods that have vitamin B12 in them. This information is not intended to replace advice  given to you by your health care provider. Make sure you discuss any questions you have with your health care provider. Document Revised: 07/25/2021 Document Reviewed: 07/25/2021 Elsevier Patient Education  2024 [employer].

## 2024-06-30 NOTE — Progress Notes (Signed)
 Pt here for monthly B12 injection per JM  B12 1000mcg given IM and pt tolerated injection well.  Next B12 injection scheduled for 07/07/24

## 2024-07-07 ENCOUNTER — Ambulatory Visit

## 2024-07-07 VITALS — BP 124/78 | HR 80 | Temp 98.1°F | Ht 64.0 in | Wt 201.0 lb

## 2024-07-07 DIAGNOSIS — D519 Vitamin B12 deficiency anemia, unspecified: Secondary | ICD-10-CM

## 2024-07-07 MED ORDER — CYANOCOBALAMIN 1000 MCG/ML IJ SOLN
1000.0000 ug | Freq: Once | INTRAMUSCULAR | Status: AC
  Administered 2024-07-07: 1000 ug via INTRAMUSCULAR

## 2024-07-07 NOTE — Progress Notes (Signed)
 Pt here for monthly B12 injection per Maria Simmons  B12 1000mcg given IM and pt tolerated injection well.  Next B12 injection scheduled for 07/14/24.

## 2024-07-12 ENCOUNTER — Other Ambulatory Visit: Payer: Self-pay

## 2024-07-12 ENCOUNTER — Other Ambulatory Visit (HOSPITAL_COMMUNITY): Payer: Self-pay

## 2024-07-14 ENCOUNTER — Ambulatory Visit (INDEPENDENT_AMBULATORY_CARE_PROVIDER_SITE_OTHER)

## 2024-07-14 VITALS — BP 130/70 | HR 98 | Temp 98.5°F | Ht 64.0 in | Wt 201.0 lb

## 2024-07-14 DIAGNOSIS — E538 Deficiency of other specified B group vitamins: Secondary | ICD-10-CM

## 2024-07-14 DIAGNOSIS — E890 Postprocedural hypothyroidism: Secondary | ICD-10-CM | POA: Diagnosis not present

## 2024-07-14 MED ORDER — CYANOCOBALAMIN 1000 MCG/ML IJ SOLN
1000.0000 ug | Freq: Once | INTRAMUSCULAR | Status: AC
  Administered 2024-07-14: 1000 ug via INTRAMUSCULAR

## 2024-07-14 NOTE — Progress Notes (Signed)
 Patient is in office today for a nurse visit for B12 Injection. Patient Injection was given in the  Right deltoid. Patient tolerated injection well.

## 2024-07-18 ENCOUNTER — Other Ambulatory Visit: Payer: Self-pay | Admitting: Obstetrics and Gynecology

## 2024-07-18 DIAGNOSIS — Z1231 Encounter for screening mammogram for malignant neoplasm of breast: Secondary | ICD-10-CM

## 2024-07-21 ENCOUNTER — Ambulatory Visit (INDEPENDENT_AMBULATORY_CARE_PROVIDER_SITE_OTHER): Payer: Self-pay

## 2024-07-21 VITALS — BP 130/70 | HR 98 | Temp 98.6°F | Ht 64.0 in | Wt 201.0 lb

## 2024-07-21 DIAGNOSIS — E538 Deficiency of other specified B group vitamins: Secondary | ICD-10-CM | POA: Diagnosis not present

## 2024-07-21 MED ORDER — CYANOCOBALAMIN 1000 MCG/ML IJ SOLN
1000.0000 ug | Freq: Once | INTRAMUSCULAR | Status: AC
  Administered 2024-07-21: 1000 ug via INTRAMUSCULAR

## 2024-07-21 NOTE — Progress Notes (Signed)
 Patient is in office today for a nurse visit for B12 Injection. Patient Injection was given in the  Left deltoid. Patient tolerated injection well.

## 2024-07-22 LAB — VITAMIN B12: Vitamin B-12: 1212 pg/mL (ref 232–1245)

## 2024-07-24 ENCOUNTER — Ambulatory Visit: Payer: Self-pay | Admitting: Nurse Practitioner

## 2024-07-28 ENCOUNTER — Other Ambulatory Visit: Payer: Self-pay | Admitting: Medical Genetics

## 2024-07-28 ENCOUNTER — Ambulatory Visit
Admission: RE | Admit: 2024-07-28 | Discharge: 2024-07-28 | Disposition: A | Discharge status: Home / Self Care | Source: Ambulatory Visit | Attending: Obstetrics and Gynecology | Admitting: Obstetrics and Gynecology

## 2024-07-28 DIAGNOSIS — Z1231 Encounter for screening mammogram for malignant neoplasm of breast: Secondary | ICD-10-CM | POA: Diagnosis not present

## 2024-08-05 ENCOUNTER — Other Ambulatory Visit
Admission: RE | Admit: 2024-08-05 | Discharge: 2024-08-05 | Disposition: A | Discharge status: Home / Self Care | Payer: Self-pay | Source: Ambulatory Visit | Attending: Medical Genetics | Admitting: Medical Genetics

## 2024-08-10 ENCOUNTER — Other Ambulatory Visit (HOSPITAL_COMMUNITY): Payer: Self-pay

## 2024-08-10 ENCOUNTER — Other Ambulatory Visit: Payer: Self-pay

## 2024-08-20 LAB — GENECONNECT MOLECULAR SCREEN: Genetic Analysis Overall Interpretation: NEGATIVE

## 2024-08-23 DIAGNOSIS — E039 Hypothyroidism, unspecified: Secondary | ICD-10-CM | POA: Diagnosis not present

## 2024-08-23 DIAGNOSIS — Z01419 Encounter for gynecological examination (general) (routine) without abnormal findings: Secondary | ICD-10-CM | POA: Diagnosis not present

## 2024-08-23 DIAGNOSIS — C8330 Diffuse large B-cell lymphoma, unspecified site: Secondary | ICD-10-CM | POA: Diagnosis not present

## 2024-08-23 DIAGNOSIS — Z85850 Personal history of malignant neoplasm of thyroid: Secondary | ICD-10-CM | POA: Diagnosis not present

## 2024-08-23 DIAGNOSIS — Z90710 Acquired absence of both cervix and uterus: Secondary | ICD-10-CM | POA: Diagnosis not present

## 2024-09-11 ENCOUNTER — Other Ambulatory Visit: Payer: Self-pay

## 2024-09-11 ENCOUNTER — Other Ambulatory Visit (HOSPITAL_COMMUNITY): Payer: Self-pay

## 2024-09-20 DIAGNOSIS — E890 Postprocedural hypothyroidism: Secondary | ICD-10-CM | POA: Diagnosis not present

## 2024-09-25 ENCOUNTER — Other Ambulatory Visit (HOSPITAL_COMMUNITY): Payer: Self-pay

## 2024-09-25 ENCOUNTER — Other Ambulatory Visit: Payer: Self-pay

## 2024-09-25 DIAGNOSIS — E890 Postprocedural hypothyroidism: Secondary | ICD-10-CM | POA: Diagnosis not present

## 2024-09-25 DIAGNOSIS — I10 Essential (primary) hypertension: Secondary | ICD-10-CM | POA: Diagnosis not present

## 2024-09-25 DIAGNOSIS — E1165 Type 2 diabetes mellitus with hyperglycemia: Secondary | ICD-10-CM | POA: Diagnosis not present

## 2024-09-25 DIAGNOSIS — C73 Malignant neoplasm of thyroid gland: Secondary | ICD-10-CM | POA: Diagnosis not present

## 2024-09-25 MED ORDER — LIOTHYRONINE SODIUM 5 MCG PO TABS
5.0000 ug | ORAL_TABLET | Freq: Every day | ORAL | 0 refills | Status: AC
  Filled 2024-09-25 (×2): qty 10, 10d supply, fill #0

## 2024-10-11 ENCOUNTER — Other Ambulatory Visit (HOSPITAL_COMMUNITY): Payer: Self-pay

## 2024-10-11 ENCOUNTER — Other Ambulatory Visit: Payer: Self-pay

## 2024-10-11 ENCOUNTER — Other Ambulatory Visit: Payer: Self-pay | Admitting: Nurse Practitioner

## 2024-10-11 DIAGNOSIS — E7849 Other hyperlipidemia: Secondary | ICD-10-CM

## 2024-10-11 MED ORDER — ATORVASTATIN CALCIUM 40 MG PO TABS
40.0000 mg | ORAL_TABLET | Freq: Every day | ORAL | 1 refills | Status: AC
  Filled 2024-10-11 – 2024-12-20 (×3): qty 90, 90d supply, fill #0

## 2024-10-12 ENCOUNTER — Other Ambulatory Visit: Payer: Self-pay

## 2024-11-06 ENCOUNTER — Other Ambulatory Visit (HOSPITAL_COMMUNITY): Payer: Self-pay

## 2024-11-07 ENCOUNTER — Other Ambulatory Visit (HOSPITAL_COMMUNITY): Payer: Self-pay

## 2024-11-07 ENCOUNTER — Other Ambulatory Visit: Payer: Self-pay

## 2024-11-07 MED ORDER — VITAMIN D (ERGOCALCIFEROL) 1.25 MG (50000 UNIT) PO CAPS
50000.0000 [IU] | ORAL_CAPSULE | ORAL | 5 refills | Status: AC
  Filled 2024-11-10: qty 12, 84d supply, fill #0

## 2024-11-07 MED ORDER — SYNTHROID 125 MCG PO TABS
125.0000 ug | ORAL_TABLET | Freq: Every morning | ORAL | 5 refills | Status: AC
  Filled 2024-11-07: qty 30, 30d supply, fill #0
  Filled 2024-12-20: qty 30, 30d supply, fill #1

## 2024-11-10 ENCOUNTER — Other Ambulatory Visit (HOSPITAL_COMMUNITY): Payer: Self-pay

## 2024-11-13 ENCOUNTER — Other Ambulatory Visit (HOSPITAL_COMMUNITY): Payer: Self-pay

## 2024-11-13 ENCOUNTER — Encounter (HOSPITAL_COMMUNITY): Payer: Self-pay

## 2024-11-14 DIAGNOSIS — E1165 Type 2 diabetes mellitus with hyperglycemia: Secondary | ICD-10-CM | POA: Diagnosis not present

## 2024-11-14 DIAGNOSIS — E890 Postprocedural hypothyroidism: Secondary | ICD-10-CM | POA: Diagnosis not present

## 2024-11-14 DIAGNOSIS — E559 Vitamin D deficiency, unspecified: Secondary | ICD-10-CM | POA: Diagnosis not present

## 2024-11-15 ENCOUNTER — Other Ambulatory Visit (HOSPITAL_COMMUNITY): Payer: Self-pay

## 2024-11-15 DIAGNOSIS — E1165 Type 2 diabetes mellitus with hyperglycemia: Secondary | ICD-10-CM | POA: Diagnosis not present

## 2024-11-15 DIAGNOSIS — C73 Malignant neoplasm of thyroid gland: Secondary | ICD-10-CM | POA: Diagnosis not present

## 2024-11-15 DIAGNOSIS — E890 Postprocedural hypothyroidism: Secondary | ICD-10-CM | POA: Diagnosis not present

## 2024-11-15 DIAGNOSIS — I10 Essential (primary) hypertension: Secondary | ICD-10-CM | POA: Diagnosis not present

## 2024-11-15 MED ORDER — MOUNJARO 12.5 MG/0.5ML ~~LOC~~ SOAJ: 12.5000 mg | SUBCUTANEOUS | 5 refills | Status: AC

## 2024-11-15 MED ORDER — VITAMIN D (ERGOCALCIFEROL) 1.25 MG (50000 UNIT) PO CAPS: 50000.0000 [IU] | ORAL_CAPSULE | ORAL | 5 refills | Status: AC

## 2024-11-15 MED ORDER — SYNTHROID 125 MCG PO TABS: 125.0000 ug | ORAL_TABLET | Freq: Every morning | ORAL | 5 refills | Status: AC

## 2024-12-20 ENCOUNTER — Encounter (HOSPITAL_COMMUNITY): Payer: Self-pay

## 2024-12-20 ENCOUNTER — Other Ambulatory Visit (HOSPITAL_COMMUNITY): Payer: Self-pay

## 2024-12-20 ENCOUNTER — Other Ambulatory Visit: Payer: Self-pay

## 2024-12-21 ENCOUNTER — Other Ambulatory Visit (HOSPITAL_COMMUNITY): Payer: Self-pay

## 2025-06-18 ENCOUNTER — Encounter: Payer: Self-pay | Admitting: Nurse Practitioner
